# Patient Record
Sex: Female | Born: 1935
Health system: Southern US, Community
[De-identification: ages and names within clinical notes are randomized; demographics above are authoritative.]

## PROBLEM LIST (undated history)

## (undated) DIAGNOSIS — E785 Hyperlipidemia, unspecified: Secondary | ICD-10-CM

## (undated) DIAGNOSIS — I10 Essential (primary) hypertension: Secondary | ICD-10-CM

## (undated) DIAGNOSIS — I809 Phlebitis and thrombophlebitis of unspecified site: Secondary | ICD-10-CM

## (undated) DIAGNOSIS — R296 Repeated falls: Secondary | ICD-10-CM

## (undated) DIAGNOSIS — F32A Depression, unspecified: Secondary | ICD-10-CM

## (undated) DIAGNOSIS — I5032 Chronic diastolic (congestive) heart failure: Secondary | ICD-10-CM

## (undated) DIAGNOSIS — C801 Malignant (primary) neoplasm, unspecified: Secondary | ICD-10-CM

## (undated) DIAGNOSIS — I82409 Acute embolism and thrombosis of unspecified deep veins of unspecified lower extremity: Secondary | ICD-10-CM

## (undated) DIAGNOSIS — M1990 Unspecified osteoarthritis, unspecified site: Secondary | ICD-10-CM

## (undated) DIAGNOSIS — F329 Major depressive disorder, single episode, unspecified: Secondary | ICD-10-CM

## (undated) DIAGNOSIS — I4819 Other persistent atrial fibrillation: Secondary | ICD-10-CM

## (undated) DIAGNOSIS — J45909 Unspecified asthma, uncomplicated: Secondary | ICD-10-CM

## (undated) HISTORY — DX: Unspecified osteoarthritis, unspecified site: M19.90

## (undated) HISTORY — DX: Other persistent atrial fibrillation: I48.19

## (undated) HISTORY — DX: Depression, unspecified: F32.A

## (undated) HISTORY — PX: BREAST CYST ASPIRATION: SHX578

## (undated) HISTORY — PX: CATARACT EXTRACTION: SUR2

## (undated) HISTORY — DX: Essential (primary) hypertension: I10

## (undated) HISTORY — DX: Chronic diastolic (congestive) heart failure: I50.32

## (undated) HISTORY — PX: SKIN BIOPSY: SHX1

## (undated) HISTORY — DX: Unspecified asthma, uncomplicated: J45.909

## (undated) HISTORY — DX: Major depressive disorder, single episode, unspecified: F32.9

## (undated) HISTORY — DX: Hyperlipidemia, unspecified: E78.5

## (undated) HISTORY — DX: Repeated falls: R29.6

---

## 1968-04-22 HISTORY — PX: THYROIDECTOMY: SHX17

## 1970-04-22 HISTORY — PX: OTHER SURGICAL HISTORY: SHX169

## 1986-04-22 HISTORY — PX: OTHER SURGICAL HISTORY: SHX169

## 2004-03-22 ENCOUNTER — Ambulatory Visit: Payer: Self-pay | Admitting: Internal Medicine

## 2004-03-30 ENCOUNTER — Ambulatory Visit: Payer: Self-pay | Admitting: Internal Medicine

## 2004-10-05 ENCOUNTER — Ambulatory Visit: Payer: Self-pay

## 2005-04-29 ENCOUNTER — Ambulatory Visit: Payer: Self-pay

## 2006-05-23 ENCOUNTER — Ambulatory Visit: Payer: Self-pay | Admitting: Internal Medicine

## 2007-10-16 ENCOUNTER — Ambulatory Visit: Payer: Self-pay | Admitting: Ophthalmology

## 2007-10-16 ENCOUNTER — Other Ambulatory Visit: Payer: Self-pay

## 2007-11-18 ENCOUNTER — Ambulatory Visit: Payer: Self-pay | Admitting: Ophthalmology

## 2008-01-20 ENCOUNTER — Ambulatory Visit: Payer: Self-pay | Admitting: Internal Medicine

## 2009-06-16 ENCOUNTER — Ambulatory Visit: Payer: Self-pay | Admitting: Internal Medicine

## 2009-11-29 ENCOUNTER — Ambulatory Visit: Payer: Self-pay | Admitting: Unknown Physician Specialty

## 2009-11-29 HISTORY — PX: WRIST SURGERY: SHX841

## 2010-03-24 ENCOUNTER — Emergency Department: Payer: Self-pay | Admitting: Emergency Medicine

## 2010-07-30 ENCOUNTER — Ambulatory Visit: Payer: Self-pay | Admitting: Internal Medicine

## 2011-05-16 DIAGNOSIS — I4891 Unspecified atrial fibrillation: Secondary | ICD-10-CM | POA: Diagnosis not present

## 2011-05-16 DIAGNOSIS — I10 Essential (primary) hypertension: Secondary | ICD-10-CM | POA: Diagnosis not present

## 2011-05-16 DIAGNOSIS — J449 Chronic obstructive pulmonary disease, unspecified: Secondary | ICD-10-CM | POA: Diagnosis not present

## 2011-06-10 DIAGNOSIS — J310 Chronic rhinitis: Secondary | ICD-10-CM | POA: Diagnosis not present

## 2011-06-10 DIAGNOSIS — J45909 Unspecified asthma, uncomplicated: Secondary | ICD-10-CM | POA: Diagnosis not present

## 2011-06-27 DIAGNOSIS — M159 Polyosteoarthritis, unspecified: Secondary | ICD-10-CM | POA: Diagnosis not present

## 2011-06-27 DIAGNOSIS — E785 Hyperlipidemia, unspecified: Secondary | ICD-10-CM | POA: Diagnosis not present

## 2011-06-27 DIAGNOSIS — M810 Age-related osteoporosis without current pathological fracture: Secondary | ICD-10-CM | POA: Diagnosis not present

## 2011-06-27 DIAGNOSIS — J45909 Unspecified asthma, uncomplicated: Secondary | ICD-10-CM | POA: Diagnosis not present

## 2011-06-27 DIAGNOSIS — K219 Gastro-esophageal reflux disease without esophagitis: Secondary | ICD-10-CM | POA: Diagnosis not present

## 2011-06-27 DIAGNOSIS — I4891 Unspecified atrial fibrillation: Secondary | ICD-10-CM | POA: Diagnosis not present

## 2011-06-27 DIAGNOSIS — E039 Hypothyroidism, unspecified: Secondary | ICD-10-CM | POA: Diagnosis not present

## 2011-10-04 DIAGNOSIS — H6120 Impacted cerumen, unspecified ear: Secondary | ICD-10-CM | POA: Diagnosis not present

## 2011-10-04 DIAGNOSIS — H60339 Swimmer's ear, unspecified ear: Secondary | ICD-10-CM | POA: Diagnosis not present

## 2011-10-07 ENCOUNTER — Ambulatory Visit: Payer: Self-pay | Admitting: Internal Medicine

## 2011-10-07 DIAGNOSIS — Z1231 Encounter for screening mammogram for malignant neoplasm of breast: Secondary | ICD-10-CM | POA: Diagnosis not present

## 2011-10-07 DIAGNOSIS — R920 Mammographic microcalcification found on diagnostic imaging of breast: Secondary | ICD-10-CM | POA: Diagnosis not present

## 2011-10-14 DIAGNOSIS — H60399 Other infective otitis externa, unspecified ear: Secondary | ICD-10-CM | POA: Diagnosis not present

## 2011-10-14 DIAGNOSIS — H6120 Impacted cerumen, unspecified ear: Secondary | ICD-10-CM | POA: Diagnosis not present

## 2011-10-14 DIAGNOSIS — I4891 Unspecified atrial fibrillation: Secondary | ICD-10-CM | POA: Diagnosis not present

## 2011-11-04 DIAGNOSIS — I10 Essential (primary) hypertension: Secondary | ICD-10-CM | POA: Diagnosis not present

## 2011-11-04 DIAGNOSIS — E785 Hyperlipidemia, unspecified: Secondary | ICD-10-CM | POA: Diagnosis not present

## 2011-11-04 DIAGNOSIS — I4891 Unspecified atrial fibrillation: Secondary | ICD-10-CM | POA: Diagnosis not present

## 2011-11-04 DIAGNOSIS — E039 Hypothyroidism, unspecified: Secondary | ICD-10-CM | POA: Diagnosis not present

## 2011-11-04 DIAGNOSIS — J45909 Unspecified asthma, uncomplicated: Secondary | ICD-10-CM | POA: Diagnosis not present

## 2011-11-05 DIAGNOSIS — H7290 Unspecified perforation of tympanic membrane, unspecified ear: Secondary | ICD-10-CM | POA: Diagnosis not present

## 2011-11-05 DIAGNOSIS — J310 Chronic rhinitis: Secondary | ICD-10-CM | POA: Diagnosis not present

## 2011-11-05 DIAGNOSIS — J45909 Unspecified asthma, uncomplicated: Secondary | ICD-10-CM | POA: Diagnosis not present

## 2011-11-05 DIAGNOSIS — H60509 Unspecified acute noninfective otitis externa, unspecified ear: Secondary | ICD-10-CM | POA: Diagnosis not present

## 2011-11-05 DIAGNOSIS — H905 Unspecified sensorineural hearing loss: Secondary | ICD-10-CM | POA: Diagnosis not present

## 2011-11-11 DIAGNOSIS — I10 Essential (primary) hypertension: Secondary | ICD-10-CM | POA: Diagnosis not present

## 2011-11-11 DIAGNOSIS — E782 Mixed hyperlipidemia: Secondary | ICD-10-CM | POA: Diagnosis not present

## 2011-11-11 DIAGNOSIS — I059 Rheumatic mitral valve disease, unspecified: Secondary | ICD-10-CM | POA: Diagnosis not present

## 2011-11-11 DIAGNOSIS — I4891 Unspecified atrial fibrillation: Secondary | ICD-10-CM | POA: Diagnosis not present

## 2011-11-16 DIAGNOSIS — I4891 Unspecified atrial fibrillation: Secondary | ICD-10-CM | POA: Diagnosis not present

## 2011-11-16 DIAGNOSIS — F329 Major depressive disorder, single episode, unspecified: Secondary | ICD-10-CM | POA: Diagnosis not present

## 2011-11-16 DIAGNOSIS — J449 Chronic obstructive pulmonary disease, unspecified: Secondary | ICD-10-CM | POA: Diagnosis not present

## 2011-11-16 DIAGNOSIS — I739 Peripheral vascular disease, unspecified: Secondary | ICD-10-CM | POA: Diagnosis not present

## 2011-11-16 DIAGNOSIS — I10 Essential (primary) hypertension: Secondary | ICD-10-CM | POA: Diagnosis not present

## 2011-11-16 DIAGNOSIS — I2510 Atherosclerotic heart disease of native coronary artery without angina pectoris: Secondary | ICD-10-CM | POA: Diagnosis not present

## 2011-11-16 DIAGNOSIS — Z9981 Dependence on supplemental oxygen: Secondary | ICD-10-CM | POA: Diagnosis not present

## 2011-11-19 DIAGNOSIS — J449 Chronic obstructive pulmonary disease, unspecified: Secondary | ICD-10-CM | POA: Diagnosis not present

## 2011-11-19 DIAGNOSIS — I10 Essential (primary) hypertension: Secondary | ICD-10-CM | POA: Diagnosis not present

## 2011-11-19 DIAGNOSIS — I4891 Unspecified atrial fibrillation: Secondary | ICD-10-CM | POA: Diagnosis not present

## 2011-11-19 DIAGNOSIS — F329 Major depressive disorder, single episode, unspecified: Secondary | ICD-10-CM | POA: Diagnosis not present

## 2011-11-19 DIAGNOSIS — I2510 Atherosclerotic heart disease of native coronary artery without angina pectoris: Secondary | ICD-10-CM | POA: Diagnosis not present

## 2011-11-19 DIAGNOSIS — I739 Peripheral vascular disease, unspecified: Secondary | ICD-10-CM | POA: Diagnosis not present

## 2011-11-21 DIAGNOSIS — I10 Essential (primary) hypertension: Secondary | ICD-10-CM | POA: Diagnosis not present

## 2011-11-21 DIAGNOSIS — I4891 Unspecified atrial fibrillation: Secondary | ICD-10-CM | POA: Diagnosis not present

## 2011-11-21 DIAGNOSIS — I2510 Atherosclerotic heart disease of native coronary artery without angina pectoris: Secondary | ICD-10-CM | POA: Diagnosis not present

## 2011-11-21 DIAGNOSIS — F329 Major depressive disorder, single episode, unspecified: Secondary | ICD-10-CM | POA: Diagnosis not present

## 2011-11-21 DIAGNOSIS — I739 Peripheral vascular disease, unspecified: Secondary | ICD-10-CM | POA: Diagnosis not present

## 2011-11-21 DIAGNOSIS — J449 Chronic obstructive pulmonary disease, unspecified: Secondary | ICD-10-CM | POA: Diagnosis not present

## 2011-12-06 DIAGNOSIS — J449 Chronic obstructive pulmonary disease, unspecified: Secondary | ICD-10-CM | POA: Diagnosis not present

## 2011-12-06 DIAGNOSIS — I739 Peripheral vascular disease, unspecified: Secondary | ICD-10-CM | POA: Diagnosis not present

## 2011-12-06 DIAGNOSIS — I10 Essential (primary) hypertension: Secondary | ICD-10-CM | POA: Diagnosis not present

## 2011-12-06 DIAGNOSIS — I2510 Atherosclerotic heart disease of native coronary artery without angina pectoris: Secondary | ICD-10-CM | POA: Diagnosis not present

## 2011-12-06 DIAGNOSIS — I4891 Unspecified atrial fibrillation: Secondary | ICD-10-CM | POA: Diagnosis not present

## 2011-12-06 DIAGNOSIS — F329 Major depressive disorder, single episode, unspecified: Secondary | ICD-10-CM | POA: Diagnosis not present

## 2011-12-10 DIAGNOSIS — H6120 Impacted cerumen, unspecified ear: Secondary | ICD-10-CM | POA: Diagnosis not present

## 2011-12-10 DIAGNOSIS — H905 Unspecified sensorineural hearing loss: Secondary | ICD-10-CM | POA: Diagnosis not present

## 2011-12-10 DIAGNOSIS — H60509 Unspecified acute noninfective otitis externa, unspecified ear: Secondary | ICD-10-CM | POA: Diagnosis not present

## 2011-12-13 DIAGNOSIS — I739 Peripheral vascular disease, unspecified: Secondary | ICD-10-CM | POA: Diagnosis not present

## 2011-12-13 DIAGNOSIS — I2510 Atherosclerotic heart disease of native coronary artery without angina pectoris: Secondary | ICD-10-CM | POA: Diagnosis not present

## 2011-12-13 DIAGNOSIS — F329 Major depressive disorder, single episode, unspecified: Secondary | ICD-10-CM | POA: Diagnosis not present

## 2011-12-13 DIAGNOSIS — I10 Essential (primary) hypertension: Secondary | ICD-10-CM | POA: Diagnosis not present

## 2011-12-13 DIAGNOSIS — J449 Chronic obstructive pulmonary disease, unspecified: Secondary | ICD-10-CM | POA: Diagnosis not present

## 2011-12-13 DIAGNOSIS — I4891 Unspecified atrial fibrillation: Secondary | ICD-10-CM | POA: Diagnosis not present

## 2011-12-18 DIAGNOSIS — I4891 Unspecified atrial fibrillation: Secondary | ICD-10-CM | POA: Diagnosis not present

## 2011-12-18 DIAGNOSIS — F329 Major depressive disorder, single episode, unspecified: Secondary | ICD-10-CM | POA: Diagnosis not present

## 2011-12-18 DIAGNOSIS — I2510 Atherosclerotic heart disease of native coronary artery without angina pectoris: Secondary | ICD-10-CM | POA: Diagnosis not present

## 2011-12-18 DIAGNOSIS — I10 Essential (primary) hypertension: Secondary | ICD-10-CM | POA: Diagnosis not present

## 2011-12-18 DIAGNOSIS — I739 Peripheral vascular disease, unspecified: Secondary | ICD-10-CM | POA: Diagnosis not present

## 2011-12-18 DIAGNOSIS — J449 Chronic obstructive pulmonary disease, unspecified: Secondary | ICD-10-CM | POA: Diagnosis not present

## 2011-12-30 DIAGNOSIS — E039 Hypothyroidism, unspecified: Secondary | ICD-10-CM | POA: Diagnosis not present

## 2011-12-30 DIAGNOSIS — M810 Age-related osteoporosis without current pathological fracture: Secondary | ICD-10-CM | POA: Diagnosis not present

## 2011-12-30 DIAGNOSIS — I10 Essential (primary) hypertension: Secondary | ICD-10-CM | POA: Diagnosis not present

## 2011-12-30 DIAGNOSIS — Z79899 Other long term (current) drug therapy: Secondary | ICD-10-CM | POA: Diagnosis not present

## 2011-12-30 DIAGNOSIS — E782 Mixed hyperlipidemia: Secondary | ICD-10-CM | POA: Diagnosis not present

## 2012-02-03 DIAGNOSIS — I10 Essential (primary) hypertension: Secondary | ICD-10-CM | POA: Diagnosis not present

## 2012-02-03 DIAGNOSIS — Z23 Encounter for immunization: Secondary | ICD-10-CM | POA: Diagnosis not present

## 2012-02-03 DIAGNOSIS — E785 Hyperlipidemia, unspecified: Secondary | ICD-10-CM | POA: Diagnosis not present

## 2012-02-03 DIAGNOSIS — I4891 Unspecified atrial fibrillation: Secondary | ICD-10-CM | POA: Diagnosis not present

## 2012-02-03 DIAGNOSIS — D1800 Hemangioma unspecified site: Secondary | ICD-10-CM | POA: Diagnosis not present

## 2012-02-03 DIAGNOSIS — D239 Other benign neoplasm of skin, unspecified: Secondary | ICD-10-CM | POA: Diagnosis not present

## 2012-02-03 DIAGNOSIS — J45909 Unspecified asthma, uncomplicated: Secondary | ICD-10-CM | POA: Diagnosis not present

## 2012-02-03 DIAGNOSIS — L821 Other seborrheic keratosis: Secondary | ICD-10-CM | POA: Diagnosis not present

## 2012-02-03 DIAGNOSIS — F329 Major depressive disorder, single episode, unspecified: Secondary | ICD-10-CM | POA: Diagnosis not present

## 2012-02-03 DIAGNOSIS — L578 Other skin changes due to chronic exposure to nonionizing radiation: Secondary | ICD-10-CM | POA: Diagnosis not present

## 2012-02-28 DIAGNOSIS — H26499 Other secondary cataract, unspecified eye: Secondary | ICD-10-CM | POA: Diagnosis not present

## 2012-04-08 ENCOUNTER — Ambulatory Visit: Payer: Self-pay | Admitting: Internal Medicine

## 2012-04-08 DIAGNOSIS — M25569 Pain in unspecified knee: Secondary | ICD-10-CM | POA: Diagnosis not present

## 2012-04-08 DIAGNOSIS — M79609 Pain in unspecified limb: Secondary | ICD-10-CM | POA: Diagnosis not present

## 2012-04-08 DIAGNOSIS — M7989 Other specified soft tissue disorders: Secondary | ICD-10-CM | POA: Diagnosis not present

## 2012-05-04 DIAGNOSIS — I10 Essential (primary) hypertension: Secondary | ICD-10-CM | POA: Diagnosis not present

## 2012-05-04 DIAGNOSIS — E039 Hypothyroidism, unspecified: Secondary | ICD-10-CM | POA: Diagnosis not present

## 2012-05-04 DIAGNOSIS — M7120 Synovial cyst of popliteal space [Baker], unspecified knee: Secondary | ICD-10-CM | POA: Diagnosis not present

## 2012-05-04 DIAGNOSIS — I4891 Unspecified atrial fibrillation: Secondary | ICD-10-CM | POA: Diagnosis not present

## 2012-05-04 DIAGNOSIS — K219 Gastro-esophageal reflux disease without esophagitis: Secondary | ICD-10-CM | POA: Diagnosis not present

## 2012-05-05 DIAGNOSIS — M6580 Other synovitis and tenosynovitis, unspecified site: Secondary | ICD-10-CM | POA: Diagnosis not present

## 2012-06-08 DIAGNOSIS — I10 Essential (primary) hypertension: Secondary | ICD-10-CM | POA: Diagnosis not present

## 2012-06-08 DIAGNOSIS — I4891 Unspecified atrial fibrillation: Secondary | ICD-10-CM | POA: Diagnosis not present

## 2012-06-08 DIAGNOSIS — E785 Hyperlipidemia, unspecified: Secondary | ICD-10-CM | POA: Diagnosis not present

## 2012-06-08 DIAGNOSIS — I059 Rheumatic mitral valve disease, unspecified: Secondary | ICD-10-CM | POA: Diagnosis not present

## 2012-06-09 DIAGNOSIS — M6580 Other synovitis and tenosynovitis, unspecified site: Secondary | ICD-10-CM | POA: Diagnosis not present

## 2012-08-04 DIAGNOSIS — M810 Age-related osteoporosis without current pathological fracture: Secondary | ICD-10-CM | POA: Diagnosis not present

## 2012-08-10 DIAGNOSIS — R05 Cough: Secondary | ICD-10-CM | POA: Diagnosis not present

## 2012-08-10 DIAGNOSIS — J45909 Unspecified asthma, uncomplicated: Secondary | ICD-10-CM | POA: Diagnosis not present

## 2012-08-10 DIAGNOSIS — J438 Other emphysema: Secondary | ICD-10-CM | POA: Diagnosis not present

## 2012-08-31 DIAGNOSIS — E782 Mixed hyperlipidemia: Secondary | ICD-10-CM | POA: Diagnosis not present

## 2012-08-31 DIAGNOSIS — J45909 Unspecified asthma, uncomplicated: Secondary | ICD-10-CM | POA: Diagnosis not present

## 2012-08-31 DIAGNOSIS — R5383 Other fatigue: Secondary | ICD-10-CM | POA: Diagnosis not present

## 2012-08-31 DIAGNOSIS — R609 Edema, unspecified: Secondary | ICD-10-CM | POA: Diagnosis not present

## 2012-08-31 DIAGNOSIS — R5381 Other malaise: Secondary | ICD-10-CM | POA: Diagnosis not present

## 2012-08-31 DIAGNOSIS — M810 Age-related osteoporosis without current pathological fracture: Secondary | ICD-10-CM | POA: Diagnosis not present

## 2012-08-31 DIAGNOSIS — Z006 Encounter for examination for normal comparison and control in clinical research program: Secondary | ICD-10-CM | POA: Diagnosis not present

## 2012-08-31 DIAGNOSIS — I10 Essential (primary) hypertension: Secondary | ICD-10-CM | POA: Diagnosis not present

## 2012-08-31 DIAGNOSIS — M79609 Pain in unspecified limb: Secondary | ICD-10-CM | POA: Diagnosis not present

## 2012-08-31 DIAGNOSIS — E039 Hypothyroidism, unspecified: Secondary | ICD-10-CM | POA: Diagnosis not present

## 2012-10-05 ENCOUNTER — Ambulatory Visit: Payer: Self-pay

## 2012-10-05 DIAGNOSIS — E785 Hyperlipidemia, unspecified: Secondary | ICD-10-CM | POA: Diagnosis not present

## 2012-10-05 DIAGNOSIS — I83893 Varicose veins of bilateral lower extremities with other complications: Secondary | ICD-10-CM | POA: Diagnosis not present

## 2012-10-05 DIAGNOSIS — I739 Peripheral vascular disease, unspecified: Secondary | ICD-10-CM | POA: Diagnosis not present

## 2012-10-05 DIAGNOSIS — R609 Edema, unspecified: Secondary | ICD-10-CM | POA: Diagnosis not present

## 2012-10-05 DIAGNOSIS — M7989 Other specified soft tissue disorders: Secondary | ICD-10-CM | POA: Diagnosis not present

## 2012-10-05 DIAGNOSIS — I10 Essential (primary) hypertension: Secondary | ICD-10-CM | POA: Diagnosis not present

## 2012-10-05 DIAGNOSIS — M25569 Pain in unspecified knee: Secondary | ICD-10-CM | POA: Diagnosis not present

## 2012-10-05 DIAGNOSIS — I82819 Embolism and thrombosis of superficial veins of unspecified lower extremities: Secondary | ICD-10-CM | POA: Diagnosis not present

## 2012-10-05 DIAGNOSIS — M7120 Synovial cyst of popliteal space [Baker], unspecified knee: Secondary | ICD-10-CM | POA: Diagnosis not present

## 2012-10-08 DIAGNOSIS — R609 Edema, unspecified: Secondary | ICD-10-CM | POA: Diagnosis not present

## 2012-10-08 DIAGNOSIS — M25569 Pain in unspecified knee: Secondary | ICD-10-CM | POA: Diagnosis not present

## 2012-10-13 DIAGNOSIS — I83893 Varicose veins of bilateral lower extremities with other complications: Secondary | ICD-10-CM | POA: Diagnosis not present

## 2012-10-13 DIAGNOSIS — J45909 Unspecified asthma, uncomplicated: Secondary | ICD-10-CM | POA: Diagnosis not present

## 2012-10-13 DIAGNOSIS — K219 Gastro-esophageal reflux disease without esophagitis: Secondary | ICD-10-CM | POA: Diagnosis not present

## 2012-10-13 DIAGNOSIS — I4891 Unspecified atrial fibrillation: Secondary | ICD-10-CM | POA: Diagnosis not present

## 2012-10-13 DIAGNOSIS — R609 Edema, unspecified: Secondary | ICD-10-CM | POA: Diagnosis not present

## 2012-10-13 DIAGNOSIS — R5381 Other malaise: Secondary | ICD-10-CM | POA: Diagnosis not present

## 2012-10-13 DIAGNOSIS — I10 Essential (primary) hypertension: Secondary | ICD-10-CM | POA: Diagnosis not present

## 2012-10-13 DIAGNOSIS — R5383 Other fatigue: Secondary | ICD-10-CM | POA: Diagnosis not present

## 2012-11-09 DIAGNOSIS — I059 Rheumatic mitral valve disease, unspecified: Secondary | ICD-10-CM | POA: Diagnosis not present

## 2012-11-09 DIAGNOSIS — I4891 Unspecified atrial fibrillation: Secondary | ICD-10-CM | POA: Diagnosis not present

## 2012-11-09 DIAGNOSIS — I119 Hypertensive heart disease without heart failure: Secondary | ICD-10-CM | POA: Diagnosis not present

## 2012-11-09 DIAGNOSIS — I4949 Other premature depolarization: Secondary | ICD-10-CM | POA: Diagnosis not present

## 2012-11-11 DIAGNOSIS — I4891 Unspecified atrial fibrillation: Secondary | ICD-10-CM | POA: Diagnosis not present

## 2012-11-25 DIAGNOSIS — T63461A Toxic effect of venom of wasps, accidental (unintentional), initial encounter: Secondary | ICD-10-CM | POA: Diagnosis not present

## 2012-11-25 DIAGNOSIS — R229 Localized swelling, mass and lump, unspecified: Secondary | ICD-10-CM | POA: Diagnosis not present

## 2012-11-25 DIAGNOSIS — T7840XA Allergy, unspecified, initial encounter: Secondary | ICD-10-CM | POA: Diagnosis not present

## 2012-11-25 DIAGNOSIS — L299 Pruritus, unspecified: Secondary | ICD-10-CM | POA: Diagnosis not present

## 2013-01-04 DIAGNOSIS — E785 Hyperlipidemia, unspecified: Secondary | ICD-10-CM | POA: Diagnosis not present

## 2013-01-04 DIAGNOSIS — I4891 Unspecified atrial fibrillation: Secondary | ICD-10-CM | POA: Diagnosis not present

## 2013-01-04 DIAGNOSIS — I10 Essential (primary) hypertension: Secondary | ICD-10-CM | POA: Diagnosis not present

## 2013-01-04 DIAGNOSIS — I495 Sick sinus syndrome: Secondary | ICD-10-CM | POA: Diagnosis not present

## 2013-01-05 DIAGNOSIS — I10 Essential (primary) hypertension: Secondary | ICD-10-CM | POA: Diagnosis not present

## 2013-01-05 DIAGNOSIS — K219 Gastro-esophageal reflux disease without esophagitis: Secondary | ICD-10-CM | POA: Diagnosis not present

## 2013-01-05 DIAGNOSIS — I498 Other specified cardiac arrhythmias: Secondary | ICD-10-CM | POA: Diagnosis not present

## 2013-01-05 DIAGNOSIS — H903 Sensorineural hearing loss, bilateral: Secondary | ICD-10-CM | POA: Diagnosis not present

## 2013-01-05 DIAGNOSIS — E785 Hyperlipidemia, unspecified: Secondary | ICD-10-CM | POA: Diagnosis not present

## 2013-01-05 DIAGNOSIS — H60509 Unspecified acute noninfective otitis externa, unspecified ear: Secondary | ICD-10-CM | POA: Diagnosis not present

## 2013-01-05 DIAGNOSIS — M810 Age-related osteoporosis without current pathological fracture: Secondary | ICD-10-CM | POA: Diagnosis not present

## 2013-01-05 DIAGNOSIS — H6120 Impacted cerumen, unspecified ear: Secondary | ICD-10-CM | POA: Diagnosis not present

## 2013-01-05 DIAGNOSIS — Z23 Encounter for immunization: Secondary | ICD-10-CM | POA: Diagnosis not present

## 2013-01-05 DIAGNOSIS — E782 Mixed hyperlipidemia: Secondary | ICD-10-CM | POA: Diagnosis not present

## 2013-01-29 DIAGNOSIS — I10 Essential (primary) hypertension: Secondary | ICD-10-CM | POA: Diagnosis not present

## 2013-01-29 DIAGNOSIS — I4891 Unspecified atrial fibrillation: Secondary | ICD-10-CM | POA: Diagnosis not present

## 2013-01-29 DIAGNOSIS — E782 Mixed hyperlipidemia: Secondary | ICD-10-CM | POA: Diagnosis not present

## 2013-01-29 DIAGNOSIS — Z79899 Other long term (current) drug therapy: Secondary | ICD-10-CM | POA: Diagnosis not present

## 2013-02-04 ENCOUNTER — Ambulatory Visit: Payer: Self-pay | Admitting: Internal Medicine

## 2013-02-04 DIAGNOSIS — Z1231 Encounter for screening mammogram for malignant neoplasm of breast: Secondary | ICD-10-CM | POA: Diagnosis not present

## 2013-02-04 DIAGNOSIS — I059 Rheumatic mitral valve disease, unspecified: Secondary | ICD-10-CM | POA: Diagnosis not present

## 2013-02-04 DIAGNOSIS — I4891 Unspecified atrial fibrillation: Secondary | ICD-10-CM | POA: Diagnosis not present

## 2013-02-04 DIAGNOSIS — E782 Mixed hyperlipidemia: Secondary | ICD-10-CM | POA: Diagnosis not present

## 2013-02-04 DIAGNOSIS — I119 Hypertensive heart disease without heart failure: Secondary | ICD-10-CM | POA: Diagnosis not present

## 2013-02-05 DIAGNOSIS — R05 Cough: Secondary | ICD-10-CM | POA: Diagnosis not present

## 2013-02-05 DIAGNOSIS — J45909 Unspecified asthma, uncomplicated: Secondary | ICD-10-CM | POA: Diagnosis not present

## 2013-02-23 DIAGNOSIS — M810 Age-related osteoporosis without current pathological fracture: Secondary | ICD-10-CM | POA: Diagnosis not present

## 2013-02-23 DIAGNOSIS — E039 Hypothyroidism, unspecified: Secondary | ICD-10-CM | POA: Diagnosis not present

## 2013-02-23 DIAGNOSIS — E782 Mixed hyperlipidemia: Secondary | ICD-10-CM | POA: Diagnosis not present

## 2013-02-23 DIAGNOSIS — M25519 Pain in unspecified shoulder: Secondary | ICD-10-CM | POA: Diagnosis not present

## 2013-02-23 DIAGNOSIS — R002 Palpitations: Secondary | ICD-10-CM | POA: Diagnosis not present

## 2013-02-23 DIAGNOSIS — Z23 Encounter for immunization: Secondary | ICD-10-CM | POA: Diagnosis not present

## 2013-02-23 DIAGNOSIS — I4891 Unspecified atrial fibrillation: Secondary | ICD-10-CM | POA: Diagnosis not present

## 2013-02-25 DIAGNOSIS — M67919 Unspecified disorder of synovium and tendon, unspecified shoulder: Secondary | ICD-10-CM | POA: Diagnosis not present

## 2013-04-06 DIAGNOSIS — E039 Hypothyroidism, unspecified: Secondary | ICD-10-CM | POA: Diagnosis not present

## 2013-04-06 DIAGNOSIS — I4891 Unspecified atrial fibrillation: Secondary | ICD-10-CM | POA: Diagnosis not present

## 2013-05-27 DIAGNOSIS — I4891 Unspecified atrial fibrillation: Secondary | ICD-10-CM | POA: Diagnosis not present

## 2013-05-27 DIAGNOSIS — E782 Mixed hyperlipidemia: Secondary | ICD-10-CM | POA: Diagnosis not present

## 2013-05-27 DIAGNOSIS — I10 Essential (primary) hypertension: Secondary | ICD-10-CM | POA: Diagnosis not present

## 2013-05-27 DIAGNOSIS — M25519 Pain in unspecified shoulder: Secondary | ICD-10-CM | POA: Diagnosis not present

## 2013-06-03 DIAGNOSIS — Z9981 Dependence on supplemental oxygen: Secondary | ICD-10-CM | POA: Diagnosis not present

## 2013-06-03 DIAGNOSIS — J449 Chronic obstructive pulmonary disease, unspecified: Secondary | ICD-10-CM | POA: Diagnosis not present

## 2013-06-03 DIAGNOSIS — I4891 Unspecified atrial fibrillation: Secondary | ICD-10-CM | POA: Diagnosis not present

## 2013-06-03 DIAGNOSIS — I739 Peripheral vascular disease, unspecified: Secondary | ICD-10-CM | POA: Diagnosis not present

## 2013-06-03 DIAGNOSIS — M1990 Unspecified osteoarthritis, unspecified site: Secondary | ICD-10-CM | POA: Diagnosis not present

## 2013-06-03 DIAGNOSIS — Z9181 History of falling: Secondary | ICD-10-CM | POA: Diagnosis not present

## 2013-06-03 DIAGNOSIS — M25519 Pain in unspecified shoulder: Secondary | ICD-10-CM | POA: Diagnosis not present

## 2013-06-03 DIAGNOSIS — M810 Age-related osteoporosis without current pathological fracture: Secondary | ICD-10-CM | POA: Diagnosis not present

## 2013-06-03 DIAGNOSIS — I129 Hypertensive chronic kidney disease with stage 1 through stage 4 chronic kidney disease, or unspecified chronic kidney disease: Secondary | ICD-10-CM | POA: Diagnosis not present

## 2013-06-03 DIAGNOSIS — Z8541 Personal history of malignant neoplasm of cervix uteri: Secondary | ICD-10-CM | POA: Diagnosis not present

## 2013-06-03 DIAGNOSIS — M6281 Muscle weakness (generalized): Secondary | ICD-10-CM | POA: Diagnosis not present

## 2013-06-03 DIAGNOSIS — IMO0001 Reserved for inherently not codable concepts without codable children: Secondary | ICD-10-CM | POA: Diagnosis not present

## 2013-06-03 DIAGNOSIS — N186 End stage renal disease: Secondary | ICD-10-CM | POA: Diagnosis not present

## 2013-06-09 DIAGNOSIS — I129 Hypertensive chronic kidney disease with stage 1 through stage 4 chronic kidney disease, or unspecified chronic kidney disease: Secondary | ICD-10-CM | POA: Diagnosis not present

## 2013-06-09 DIAGNOSIS — M25519 Pain in unspecified shoulder: Secondary | ICD-10-CM | POA: Diagnosis not present

## 2013-06-09 DIAGNOSIS — IMO0001 Reserved for inherently not codable concepts without codable children: Secondary | ICD-10-CM | POA: Diagnosis not present

## 2013-06-09 DIAGNOSIS — M6281 Muscle weakness (generalized): Secondary | ICD-10-CM | POA: Diagnosis not present

## 2013-06-09 DIAGNOSIS — M1990 Unspecified osteoarthritis, unspecified site: Secondary | ICD-10-CM | POA: Diagnosis not present

## 2013-06-09 DIAGNOSIS — I4891 Unspecified atrial fibrillation: Secondary | ICD-10-CM | POA: Diagnosis not present

## 2013-06-10 DIAGNOSIS — I129 Hypertensive chronic kidney disease with stage 1 through stage 4 chronic kidney disease, or unspecified chronic kidney disease: Secondary | ICD-10-CM | POA: Diagnosis not present

## 2013-06-10 DIAGNOSIS — M1990 Unspecified osteoarthritis, unspecified site: Secondary | ICD-10-CM | POA: Diagnosis not present

## 2013-06-10 DIAGNOSIS — I739 Peripheral vascular disease, unspecified: Secondary | ICD-10-CM | POA: Diagnosis not present

## 2013-06-10 DIAGNOSIS — N186 End stage renal disease: Secondary | ICD-10-CM | POA: Diagnosis not present

## 2013-06-10 DIAGNOSIS — M25519 Pain in unspecified shoulder: Secondary | ICD-10-CM | POA: Diagnosis not present

## 2013-06-10 DIAGNOSIS — M6281 Muscle weakness (generalized): Secondary | ICD-10-CM | POA: Diagnosis not present

## 2013-06-10 DIAGNOSIS — J449 Chronic obstructive pulmonary disease, unspecified: Secondary | ICD-10-CM | POA: Diagnosis not present

## 2013-06-11 DIAGNOSIS — I4891 Unspecified atrial fibrillation: Secondary | ICD-10-CM | POA: Diagnosis not present

## 2013-06-11 DIAGNOSIS — I129 Hypertensive chronic kidney disease with stage 1 through stage 4 chronic kidney disease, or unspecified chronic kidney disease: Secondary | ICD-10-CM | POA: Diagnosis not present

## 2013-06-11 DIAGNOSIS — M6281 Muscle weakness (generalized): Secondary | ICD-10-CM | POA: Diagnosis not present

## 2013-06-11 DIAGNOSIS — IMO0001 Reserved for inherently not codable concepts without codable children: Secondary | ICD-10-CM | POA: Diagnosis not present

## 2013-06-11 DIAGNOSIS — M1990 Unspecified osteoarthritis, unspecified site: Secondary | ICD-10-CM | POA: Diagnosis not present

## 2013-06-11 DIAGNOSIS — M25519 Pain in unspecified shoulder: Secondary | ICD-10-CM | POA: Diagnosis not present

## 2013-06-16 DIAGNOSIS — M25519 Pain in unspecified shoulder: Secondary | ICD-10-CM | POA: Diagnosis not present

## 2013-06-16 DIAGNOSIS — IMO0001 Reserved for inherently not codable concepts without codable children: Secondary | ICD-10-CM | POA: Diagnosis not present

## 2013-06-16 DIAGNOSIS — I129 Hypertensive chronic kidney disease with stage 1 through stage 4 chronic kidney disease, or unspecified chronic kidney disease: Secondary | ICD-10-CM | POA: Diagnosis not present

## 2013-06-16 DIAGNOSIS — M6281 Muscle weakness (generalized): Secondary | ICD-10-CM | POA: Diagnosis not present

## 2013-06-16 DIAGNOSIS — M1990 Unspecified osteoarthritis, unspecified site: Secondary | ICD-10-CM | POA: Diagnosis not present

## 2013-06-16 DIAGNOSIS — I4891 Unspecified atrial fibrillation: Secondary | ICD-10-CM | POA: Diagnosis not present

## 2013-06-18 DIAGNOSIS — IMO0001 Reserved for inherently not codable concepts without codable children: Secondary | ICD-10-CM | POA: Diagnosis not present

## 2013-06-18 DIAGNOSIS — M6281 Muscle weakness (generalized): Secondary | ICD-10-CM | POA: Diagnosis not present

## 2013-06-18 DIAGNOSIS — I4891 Unspecified atrial fibrillation: Secondary | ICD-10-CM | POA: Diagnosis not present

## 2013-06-18 DIAGNOSIS — I129 Hypertensive chronic kidney disease with stage 1 through stage 4 chronic kidney disease, or unspecified chronic kidney disease: Secondary | ICD-10-CM | POA: Diagnosis not present

## 2013-06-18 DIAGNOSIS — M25519 Pain in unspecified shoulder: Secondary | ICD-10-CM | POA: Diagnosis not present

## 2013-06-18 DIAGNOSIS — M1990 Unspecified osteoarthritis, unspecified site: Secondary | ICD-10-CM | POA: Diagnosis not present

## 2013-07-08 DIAGNOSIS — R05 Cough: Secondary | ICD-10-CM | POA: Diagnosis not present

## 2013-07-08 DIAGNOSIS — I10 Essential (primary) hypertension: Secondary | ICD-10-CM | POA: Diagnosis not present

## 2013-07-08 DIAGNOSIS — J309 Allergic rhinitis, unspecified: Secondary | ICD-10-CM | POA: Diagnosis not present

## 2013-07-08 DIAGNOSIS — R059 Cough, unspecified: Secondary | ICD-10-CM | POA: Diagnosis not present

## 2013-07-08 DIAGNOSIS — J019 Acute sinusitis, unspecified: Secondary | ICD-10-CM | POA: Diagnosis not present

## 2013-07-08 DIAGNOSIS — J45901 Unspecified asthma with (acute) exacerbation: Secondary | ICD-10-CM | POA: Diagnosis not present

## 2013-07-15 DIAGNOSIS — R5383 Other fatigue: Secondary | ICD-10-CM | POA: Diagnosis not present

## 2013-07-15 DIAGNOSIS — J45901 Unspecified asthma with (acute) exacerbation: Secondary | ICD-10-CM | POA: Diagnosis not present

## 2013-07-15 DIAGNOSIS — R5381 Other malaise: Secondary | ICD-10-CM | POA: Diagnosis not present

## 2013-07-15 DIAGNOSIS — I10 Essential (primary) hypertension: Secondary | ICD-10-CM | POA: Diagnosis not present

## 2013-07-15 DIAGNOSIS — J069 Acute upper respiratory infection, unspecified: Secondary | ICD-10-CM | POA: Diagnosis not present

## 2013-07-15 DIAGNOSIS — E039 Hypothyroidism, unspecified: Secondary | ICD-10-CM | POA: Diagnosis not present

## 2013-07-15 DIAGNOSIS — J309 Allergic rhinitis, unspecified: Secondary | ICD-10-CM | POA: Diagnosis not present

## 2013-07-15 DIAGNOSIS — R05 Cough: Secondary | ICD-10-CM | POA: Diagnosis not present

## 2013-07-15 DIAGNOSIS — R059 Cough, unspecified: Secondary | ICD-10-CM | POA: Diagnosis not present

## 2013-07-21 DIAGNOSIS — J449 Chronic obstructive pulmonary disease, unspecified: Secondary | ICD-10-CM | POA: Diagnosis not present

## 2013-07-21 DIAGNOSIS — I10 Essential (primary) hypertension: Secondary | ICD-10-CM | POA: Diagnosis not present

## 2013-07-21 DIAGNOSIS — R059 Cough, unspecified: Secondary | ICD-10-CM | POA: Diagnosis not present

## 2013-07-21 DIAGNOSIS — R062 Wheezing: Secondary | ICD-10-CM | POA: Diagnosis not present

## 2013-07-21 DIAGNOSIS — J069 Acute upper respiratory infection, unspecified: Secondary | ICD-10-CM | POA: Diagnosis not present

## 2013-08-19 DIAGNOSIS — J45909 Unspecified asthma, uncomplicated: Secondary | ICD-10-CM | POA: Diagnosis not present

## 2013-08-26 DIAGNOSIS — J45909 Unspecified asthma, uncomplicated: Secondary | ICD-10-CM | POA: Diagnosis not present

## 2013-08-26 DIAGNOSIS — J069 Acute upper respiratory infection, unspecified: Secondary | ICD-10-CM | POA: Diagnosis not present

## 2013-08-26 DIAGNOSIS — M25519 Pain in unspecified shoulder: Secondary | ICD-10-CM | POA: Diagnosis not present

## 2013-08-26 DIAGNOSIS — L659 Nonscarring hair loss, unspecified: Secondary | ICD-10-CM | POA: Diagnosis not present

## 2013-08-26 DIAGNOSIS — I4891 Unspecified atrial fibrillation: Secondary | ICD-10-CM | POA: Diagnosis not present

## 2013-08-26 DIAGNOSIS — I10 Essential (primary) hypertension: Secondary | ICD-10-CM | POA: Diagnosis not present

## 2013-08-26 DIAGNOSIS — J309 Allergic rhinitis, unspecified: Secondary | ICD-10-CM | POA: Diagnosis not present

## 2013-08-30 DIAGNOSIS — E038 Other specified hypothyroidism: Secondary | ICD-10-CM | POA: Diagnosis not present

## 2013-08-30 DIAGNOSIS — Z79899 Other long term (current) drug therapy: Secondary | ICD-10-CM | POA: Diagnosis not present

## 2013-09-30 DIAGNOSIS — M810 Age-related osteoporosis without current pathological fracture: Secondary | ICD-10-CM | POA: Diagnosis not present

## 2013-12-16 DIAGNOSIS — M6281 Muscle weakness (generalized): Secondary | ICD-10-CM | POA: Diagnosis not present

## 2013-12-16 DIAGNOSIS — N390 Urinary tract infection, site not specified: Secondary | ICD-10-CM | POA: Diagnosis not present

## 2013-12-16 DIAGNOSIS — L659 Nonscarring hair loss, unspecified: Secondary | ICD-10-CM | POA: Diagnosis not present

## 2013-12-16 DIAGNOSIS — E039 Hypothyroidism, unspecified: Secondary | ICD-10-CM | POA: Diagnosis not present

## 2013-12-16 DIAGNOSIS — I10 Essential (primary) hypertension: Secondary | ICD-10-CM | POA: Diagnosis not present

## 2013-12-30 DIAGNOSIS — E039 Hypothyroidism, unspecified: Secondary | ICD-10-CM | POA: Diagnosis not present

## 2013-12-30 DIAGNOSIS — I10 Essential (primary) hypertension: Secondary | ICD-10-CM | POA: Diagnosis not present

## 2013-12-30 DIAGNOSIS — J45909 Unspecified asthma, uncomplicated: Secondary | ICD-10-CM | POA: Diagnosis not present

## 2013-12-30 DIAGNOSIS — K219 Gastro-esophageal reflux disease without esophagitis: Secondary | ICD-10-CM | POA: Diagnosis not present

## 2013-12-30 DIAGNOSIS — R5383 Other fatigue: Secondary | ICD-10-CM | POA: Diagnosis not present

## 2013-12-30 DIAGNOSIS — R5381 Other malaise: Secondary | ICD-10-CM | POA: Diagnosis not present

## 2013-12-30 DIAGNOSIS — I4891 Unspecified atrial fibrillation: Secondary | ICD-10-CM | POA: Diagnosis not present

## 2014-01-27 DIAGNOSIS — H6120 Impacted cerumen, unspecified ear: Secondary | ICD-10-CM | POA: Diagnosis not present

## 2014-01-27 DIAGNOSIS — H903 Sensorineural hearing loss, bilateral: Secondary | ICD-10-CM | POA: Diagnosis not present

## 2014-02-10 DIAGNOSIS — Z23 Encounter for immunization: Secondary | ICD-10-CM | POA: Diagnosis not present

## 2014-02-23 DIAGNOSIS — E038 Other specified hypothyroidism: Secondary | ICD-10-CM | POA: Diagnosis not present

## 2014-02-23 DIAGNOSIS — Z79899 Other long term (current) drug therapy: Secondary | ICD-10-CM | POA: Diagnosis not present

## 2014-03-03 DIAGNOSIS — I499 Cardiac arrhythmia, unspecified: Secondary | ICD-10-CM | POA: Diagnosis not present

## 2014-03-03 DIAGNOSIS — I10 Essential (primary) hypertension: Secondary | ICD-10-CM | POA: Diagnosis not present

## 2014-03-03 DIAGNOSIS — Z5181 Encounter for therapeutic drug level monitoring: Secondary | ICD-10-CM | POA: Diagnosis not present

## 2014-03-03 DIAGNOSIS — J45909 Unspecified asthma, uncomplicated: Secondary | ICD-10-CM | POA: Diagnosis not present

## 2014-03-31 DIAGNOSIS — M810 Age-related osteoporosis without current pathological fracture: Secondary | ICD-10-CM | POA: Diagnosis not present

## 2014-03-31 DIAGNOSIS — I499 Cardiac arrhythmia, unspecified: Secondary | ICD-10-CM | POA: Diagnosis not present

## 2014-03-31 DIAGNOSIS — Z5181 Encounter for therapeutic drug level monitoring: Secondary | ICD-10-CM | POA: Diagnosis not present

## 2014-04-13 DIAGNOSIS — M810 Age-related osteoporosis without current pathological fracture: Secondary | ICD-10-CM | POA: Diagnosis not present

## 2014-04-28 DIAGNOSIS — M810 Age-related osteoporosis without current pathological fracture: Secondary | ICD-10-CM | POA: Diagnosis not present

## 2014-05-12 DIAGNOSIS — J4520 Mild intermittent asthma, uncomplicated: Secondary | ICD-10-CM | POA: Insufficient documentation

## 2014-06-08 DIAGNOSIS — Z1283 Encounter for screening for malignant neoplasm of skin: Secondary | ICD-10-CM | POA: Diagnosis not present

## 2014-06-08 DIAGNOSIS — L814 Other melanin hyperpigmentation: Secondary | ICD-10-CM | POA: Diagnosis not present

## 2014-06-08 DIAGNOSIS — D485 Neoplasm of uncertain behavior of skin: Secondary | ICD-10-CM | POA: Diagnosis not present

## 2014-06-08 DIAGNOSIS — L821 Other seborrheic keratosis: Secondary | ICD-10-CM | POA: Diagnosis not present

## 2014-06-08 DIAGNOSIS — L820 Inflamed seborrheic keratosis: Secondary | ICD-10-CM | POA: Diagnosis not present

## 2014-06-08 DIAGNOSIS — L578 Other skin changes due to chronic exposure to nonionizing radiation: Secondary | ICD-10-CM | POA: Diagnosis not present

## 2014-06-08 DIAGNOSIS — I781 Nevus, non-neoplastic: Secondary | ICD-10-CM | POA: Diagnosis not present

## 2014-06-08 DIAGNOSIS — D1800 Hemangioma unspecified site: Secondary | ICD-10-CM | POA: Diagnosis not present

## 2014-06-08 DIAGNOSIS — D229 Melanocytic nevi, unspecified: Secondary | ICD-10-CM | POA: Diagnosis not present

## 2014-06-08 DIAGNOSIS — C44319 Basal cell carcinoma of skin of other parts of face: Secondary | ICD-10-CM | POA: Diagnosis not present

## 2014-06-08 DIAGNOSIS — I8390 Asymptomatic varicose veins of unspecified lower extremity: Secondary | ICD-10-CM | POA: Diagnosis not present

## 2014-07-07 DIAGNOSIS — I10 Essential (primary) hypertension: Secondary | ICD-10-CM | POA: Diagnosis not present

## 2014-07-07 DIAGNOSIS — I499 Cardiac arrhythmia, unspecified: Secondary | ICD-10-CM | POA: Diagnosis not present

## 2014-07-07 DIAGNOSIS — J449 Chronic obstructive pulmonary disease, unspecified: Secondary | ICD-10-CM | POA: Diagnosis not present

## 2014-07-07 DIAGNOSIS — E782 Mixed hyperlipidemia: Secondary | ICD-10-CM | POA: Diagnosis not present

## 2014-07-07 DIAGNOSIS — J329 Chronic sinusitis, unspecified: Secondary | ICD-10-CM | POA: Diagnosis not present

## 2014-07-07 DIAGNOSIS — E039 Hypothyroidism, unspecified: Secondary | ICD-10-CM | POA: Diagnosis not present

## 2014-08-02 DIAGNOSIS — C4491 Basal cell carcinoma of skin, unspecified: Secondary | ICD-10-CM

## 2014-08-02 DIAGNOSIS — C44319 Basal cell carcinoma of skin of other parts of face: Secondary | ICD-10-CM | POA: Diagnosis not present

## 2014-08-02 HISTORY — DX: Basal cell carcinoma of skin, unspecified: C44.91

## 2014-09-01 DIAGNOSIS — Z1231 Encounter for screening mammogram for malignant neoplasm of breast: Secondary | ICD-10-CM | POA: Diagnosis not present

## 2014-09-07 DIAGNOSIS — H906 Mixed conductive and sensorineural hearing loss, bilateral: Secondary | ICD-10-CM | POA: Diagnosis not present

## 2014-09-07 DIAGNOSIS — H9193 Unspecified hearing loss, bilateral: Secondary | ICD-10-CM | POA: Diagnosis not present

## 2014-09-07 DIAGNOSIS — H6121 Impacted cerumen, right ear: Secondary | ICD-10-CM | POA: Diagnosis not present

## 2014-09-20 DIAGNOSIS — H6980 Other specified disorders of Eustachian tube, unspecified ear: Secondary | ICD-10-CM | POA: Diagnosis not present

## 2014-09-20 DIAGNOSIS — H6121 Impacted cerumen, right ear: Secondary | ICD-10-CM | POA: Diagnosis not present

## 2014-10-05 DIAGNOSIS — M810 Age-related osteoporosis without current pathological fracture: Secondary | ICD-10-CM | POA: Diagnosis not present

## 2014-10-05 DIAGNOSIS — L98491 Non-pressure chronic ulcer of skin of other sites limited to breakdown of skin: Secondary | ICD-10-CM | POA: Diagnosis not present

## 2014-10-05 DIAGNOSIS — R208 Other disturbances of skin sensation: Secondary | ICD-10-CM | POA: Diagnosis not present

## 2014-10-05 DIAGNOSIS — L0291 Cutaneous abscess, unspecified: Secondary | ICD-10-CM | POA: Diagnosis not present

## 2014-10-05 DIAGNOSIS — H908 Mixed conductive and sensorineural hearing loss, unspecified: Secondary | ICD-10-CM | POA: Diagnosis not present

## 2014-10-06 ENCOUNTER — Other Ambulatory Visit: Payer: Self-pay | Admitting: Nurse Practitioner

## 2014-10-06 DIAGNOSIS — N6489 Other specified disorders of breast: Secondary | ICD-10-CM

## 2014-10-06 DIAGNOSIS — Q838 Other congenital malformations of breast: Secondary | ICD-10-CM

## 2014-10-14 ENCOUNTER — Ambulatory Visit: Payer: Self-pay

## 2014-10-14 ENCOUNTER — Ambulatory Visit
Admission: RE | Admit: 2014-10-14 | Discharge: 2014-10-14 | Disposition: A | Discharge status: Home / Self Care | Payer: Medicare Other | Source: Ambulatory Visit | Attending: Nurse Practitioner | Admitting: Nurse Practitioner

## 2014-10-14 DIAGNOSIS — N649 Disorder of breast, unspecified: Secondary | ICD-10-CM | POA: Diagnosis not present

## 2014-10-14 DIAGNOSIS — J4520 Mild intermittent asthma, uncomplicated: Secondary | ICD-10-CM | POA: Diagnosis not present

## 2014-10-14 DIAGNOSIS — H903 Sensorineural hearing loss, bilateral: Secondary | ICD-10-CM | POA: Diagnosis not present

## 2014-10-14 DIAGNOSIS — H6121 Impacted cerumen, right ear: Secondary | ICD-10-CM | POA: Diagnosis not present

## 2014-10-14 DIAGNOSIS — J301 Allergic rhinitis due to pollen: Secondary | ICD-10-CM | POA: Diagnosis not present

## 2014-10-14 DIAGNOSIS — R928 Other abnormal and inconclusive findings on diagnostic imaging of breast: Secondary | ICD-10-CM | POA: Diagnosis not present

## 2014-10-14 DIAGNOSIS — H6980 Other specified disorders of Eustachian tube, unspecified ear: Secondary | ICD-10-CM | POA: Diagnosis not present

## 2014-10-14 DIAGNOSIS — N6489 Other specified disorders of breast: Secondary | ICD-10-CM

## 2014-10-14 DIAGNOSIS — Q838 Other congenital malformations of breast: Secondary | ICD-10-CM

## 2014-10-19 DIAGNOSIS — M810 Age-related osteoporosis without current pathological fracture: Secondary | ICD-10-CM | POA: Diagnosis not present

## 2014-10-19 DIAGNOSIS — R238 Other skin changes: Secondary | ICD-10-CM | POA: Diagnosis not present

## 2014-10-19 DIAGNOSIS — D485 Neoplasm of uncertain behavior of skin: Secondary | ICD-10-CM | POA: Diagnosis not present

## 2014-10-19 DIAGNOSIS — C4492 Squamous cell carcinoma of skin, unspecified: Secondary | ICD-10-CM

## 2014-10-19 DIAGNOSIS — Z85828 Personal history of other malignant neoplasm of skin: Secondary | ICD-10-CM | POA: Diagnosis not present

## 2014-10-19 HISTORY — DX: Squamous cell carcinoma of skin, unspecified: C44.92

## 2014-12-06 DIAGNOSIS — C44329 Squamous cell carcinoma of skin of other parts of face: Secondary | ICD-10-CM | POA: Diagnosis not present

## 2014-12-15 DIAGNOSIS — I10 Essential (primary) hypertension: Secondary | ICD-10-CM | POA: Diagnosis not present

## 2014-12-15 DIAGNOSIS — Z0000 Encounter for general adult medical examination without abnormal findings: Secondary | ICD-10-CM | POA: Diagnosis not present

## 2014-12-15 DIAGNOSIS — E039 Hypothyroidism, unspecified: Secondary | ICD-10-CM | POA: Diagnosis not present

## 2014-12-15 DIAGNOSIS — I499 Cardiac arrhythmia, unspecified: Secondary | ICD-10-CM | POA: Diagnosis not present

## 2014-12-15 DIAGNOSIS — J449 Chronic obstructive pulmonary disease, unspecified: Secondary | ICD-10-CM | POA: Diagnosis not present

## 2014-12-15 DIAGNOSIS — J329 Chronic sinusitis, unspecified: Secondary | ICD-10-CM | POA: Diagnosis not present

## 2014-12-15 DIAGNOSIS — Z0001 Encounter for general adult medical examination with abnormal findings: Secondary | ICD-10-CM | POA: Diagnosis not present

## 2014-12-20 DIAGNOSIS — H903 Sensorineural hearing loss, bilateral: Secondary | ICD-10-CM | POA: Diagnosis not present

## 2014-12-20 DIAGNOSIS — H6983 Other specified disorders of Eustachian tube, bilateral: Secondary | ICD-10-CM | POA: Diagnosis not present

## 2014-12-29 DIAGNOSIS — H908 Mixed conductive and sensorineural hearing loss, unspecified: Secondary | ICD-10-CM | POA: Diagnosis not present

## 2014-12-30 DIAGNOSIS — E559 Vitamin D deficiency, unspecified: Secondary | ICD-10-CM | POA: Diagnosis not present

## 2014-12-30 DIAGNOSIS — Z5181 Encounter for therapeutic drug level monitoring: Secondary | ICD-10-CM | POA: Diagnosis not present

## 2014-12-30 DIAGNOSIS — E039 Hypothyroidism, unspecified: Secondary | ICD-10-CM | POA: Diagnosis not present

## 2014-12-30 DIAGNOSIS — Z961 Presence of intraocular lens: Secondary | ICD-10-CM | POA: Diagnosis not present

## 2014-12-30 DIAGNOSIS — I10 Essential (primary) hypertension: Secondary | ICD-10-CM | POA: Diagnosis not present

## 2015-02-15 DIAGNOSIS — Z23 Encounter for immunization: Secondary | ICD-10-CM | POA: Diagnosis not present

## 2015-03-22 DIAGNOSIS — H6063 Unspecified chronic otitis externa, bilateral: Secondary | ICD-10-CM | POA: Diagnosis not present

## 2015-03-22 DIAGNOSIS — H6123 Impacted cerumen, bilateral: Secondary | ICD-10-CM | POA: Diagnosis not present

## 2015-04-20 DIAGNOSIS — I499 Cardiac arrhythmia, unspecified: Secondary | ICD-10-CM | POA: Diagnosis not present

## 2015-04-20 DIAGNOSIS — J069 Acute upper respiratory infection, unspecified: Secondary | ICD-10-CM | POA: Diagnosis not present

## 2015-04-20 DIAGNOSIS — E039 Hypothyroidism, unspecified: Secondary | ICD-10-CM | POA: Diagnosis not present

## 2015-04-20 DIAGNOSIS — E875 Hyperkalemia: Secondary | ICD-10-CM | POA: Diagnosis not present

## 2015-04-20 DIAGNOSIS — R7301 Impaired fasting glucose: Secondary | ICD-10-CM | POA: Diagnosis not present

## 2015-04-20 DIAGNOSIS — Z5181 Encounter for therapeutic drug level monitoring: Secondary | ICD-10-CM | POA: Diagnosis not present

## 2015-04-20 DIAGNOSIS — J449 Chronic obstructive pulmonary disease, unspecified: Secondary | ICD-10-CM | POA: Diagnosis not present

## 2015-05-10 DIAGNOSIS — M810 Age-related osteoporosis without current pathological fracture: Secondary | ICD-10-CM | POA: Diagnosis not present

## 2015-05-31 DIAGNOSIS — M810 Age-related osteoporosis without current pathological fracture: Secondary | ICD-10-CM | POA: Diagnosis not present

## 2015-06-15 DIAGNOSIS — S79911A Unspecified injury of right hip, initial encounter: Secondary | ICD-10-CM | POA: Insufficient documentation

## 2015-06-15 DIAGNOSIS — L821 Other seborrheic keratosis: Secondary | ICD-10-CM | POA: Diagnosis not present

## 2015-06-15 DIAGNOSIS — I8393 Asymptomatic varicose veins of bilateral lower extremities: Secondary | ICD-10-CM | POA: Diagnosis not present

## 2015-06-15 DIAGNOSIS — L570 Actinic keratosis: Secondary | ICD-10-CM | POA: Diagnosis not present

## 2015-06-15 DIAGNOSIS — I781 Nevus, non-neoplastic: Secondary | ICD-10-CM | POA: Diagnosis not present

## 2015-06-15 DIAGNOSIS — Z85828 Personal history of other malignant neoplasm of skin: Secondary | ICD-10-CM | POA: Diagnosis not present

## 2015-06-15 DIAGNOSIS — L820 Inflamed seborrheic keratosis: Secondary | ICD-10-CM | POA: Diagnosis not present

## 2015-06-15 DIAGNOSIS — L578 Other skin changes due to chronic exposure to nonionizing radiation: Secondary | ICD-10-CM | POA: Diagnosis not present

## 2015-06-15 DIAGNOSIS — L812 Freckles: Secondary | ICD-10-CM | POA: Diagnosis not present

## 2015-06-15 DIAGNOSIS — D229 Melanocytic nevi, unspecified: Secondary | ICD-10-CM | POA: Diagnosis not present

## 2015-06-15 DIAGNOSIS — D1800 Hemangioma unspecified site: Secondary | ICD-10-CM | POA: Diagnosis not present

## 2015-06-15 DIAGNOSIS — L720 Epidermal cyst: Secondary | ICD-10-CM | POA: Diagnosis not present

## 2015-06-15 DIAGNOSIS — Z1283 Encounter for screening for malignant neoplasm of skin: Secondary | ICD-10-CM | POA: Diagnosis not present

## 2015-06-23 DIAGNOSIS — H6123 Impacted cerumen, bilateral: Secondary | ICD-10-CM | POA: Diagnosis not present

## 2015-06-23 DIAGNOSIS — H6062 Unspecified chronic otitis externa, left ear: Secondary | ICD-10-CM | POA: Diagnosis not present

## 2015-06-23 DIAGNOSIS — H903 Sensorineural hearing loss, bilateral: Secondary | ICD-10-CM | POA: Diagnosis not present

## 2015-06-29 DIAGNOSIS — Z23 Encounter for immunization: Secondary | ICD-10-CM | POA: Diagnosis not present

## 2015-08-05 DIAGNOSIS — K5900 Constipation, unspecified: Secondary | ICD-10-CM | POA: Diagnosis not present

## 2015-08-05 DIAGNOSIS — R062 Wheezing: Secondary | ICD-10-CM | POA: Diagnosis not present

## 2015-08-05 DIAGNOSIS — J45901 Unspecified asthma with (acute) exacerbation: Secondary | ICD-10-CM | POA: Diagnosis not present

## 2015-08-05 DIAGNOSIS — I10 Essential (primary) hypertension: Secondary | ICD-10-CM | POA: Diagnosis not present

## 2015-08-05 DIAGNOSIS — D72829 Elevated white blood cell count, unspecified: Secondary | ICD-10-CM | POA: Diagnosis not present

## 2015-08-05 DIAGNOSIS — J189 Pneumonia, unspecified organism: Secondary | ICD-10-CM | POA: Diagnosis not present

## 2015-08-05 DIAGNOSIS — R0602 Shortness of breath: Secondary | ICD-10-CM | POA: Diagnosis not present

## 2015-08-05 DIAGNOSIS — R509 Fever, unspecified: Secondary | ICD-10-CM | POA: Diagnosis not present

## 2015-08-05 DIAGNOSIS — A419 Sepsis, unspecified organism: Secondary | ICD-10-CM | POA: Diagnosis not present

## 2015-08-05 DIAGNOSIS — R6510 Systemic inflammatory response syndrome (SIRS) of non-infectious origin without acute organ dysfunction: Secondary | ICD-10-CM | POA: Diagnosis not present

## 2015-08-05 DIAGNOSIS — E039 Hypothyroidism, unspecified: Secondary | ICD-10-CM | POA: Diagnosis not present

## 2015-08-05 DIAGNOSIS — R0902 Hypoxemia: Secondary | ICD-10-CM | POA: Diagnosis not present

## 2015-08-06 DIAGNOSIS — D72829 Elevated white blood cell count, unspecified: Secondary | ICD-10-CM | POA: Diagnosis not present

## 2015-08-06 DIAGNOSIS — R0902 Hypoxemia: Secondary | ICD-10-CM | POA: Diagnosis not present

## 2015-08-06 DIAGNOSIS — E039 Hypothyroidism, unspecified: Secondary | ICD-10-CM | POA: Diagnosis not present

## 2015-08-06 DIAGNOSIS — I10 Essential (primary) hypertension: Secondary | ICD-10-CM | POA: Diagnosis not present

## 2015-08-06 DIAGNOSIS — R509 Fever, unspecified: Secondary | ICD-10-CM | POA: Diagnosis not present

## 2015-08-06 DIAGNOSIS — R6510 Systemic inflammatory response syndrome (SIRS) of non-infectious origin without acute organ dysfunction: Secondary | ICD-10-CM | POA: Diagnosis not present

## 2015-08-06 DIAGNOSIS — I517 Cardiomegaly: Secondary | ICD-10-CM | POA: Diagnosis not present

## 2015-08-06 DIAGNOSIS — J189 Pneumonia, unspecified organism: Secondary | ICD-10-CM | POA: Diagnosis not present

## 2015-08-06 DIAGNOSIS — J45901 Unspecified asthma with (acute) exacerbation: Secondary | ICD-10-CM | POA: Diagnosis not present

## 2015-08-07 DIAGNOSIS — J309 Allergic rhinitis, unspecified: Secondary | ICD-10-CM | POA: Diagnosis not present

## 2015-08-07 DIAGNOSIS — E785 Hyperlipidemia, unspecified: Secondary | ICD-10-CM | POA: Diagnosis present

## 2015-08-07 DIAGNOSIS — R6510 Systemic inflammatory response syndrome (SIRS) of non-infectious origin without acute organ dysfunction: Secondary | ICD-10-CM | POA: Diagnosis not present

## 2015-08-07 DIAGNOSIS — R0902 Hypoxemia: Secondary | ICD-10-CM | POA: Diagnosis present

## 2015-08-07 DIAGNOSIS — I10 Essential (primary) hypertension: Secondary | ICD-10-CM | POA: Diagnosis present

## 2015-08-07 DIAGNOSIS — J40 Bronchitis, not specified as acute or chronic: Secondary | ICD-10-CM | POA: Diagnosis not present

## 2015-08-07 DIAGNOSIS — R509 Fever, unspecified: Secondary | ICD-10-CM | POA: Diagnosis not present

## 2015-08-07 DIAGNOSIS — Z9981 Dependence on supplemental oxygen: Secondary | ICD-10-CM | POA: Diagnosis not present

## 2015-08-07 DIAGNOSIS — D72829 Elevated white blood cell count, unspecified: Secondary | ICD-10-CM | POA: Diagnosis not present

## 2015-08-07 DIAGNOSIS — J189 Pneumonia, unspecified organism: Secondary | ICD-10-CM | POA: Diagnosis present

## 2015-08-07 DIAGNOSIS — R0602 Shortness of breath: Secondary | ICD-10-CM | POA: Diagnosis not present

## 2015-08-07 DIAGNOSIS — K5900 Constipation, unspecified: Secondary | ICD-10-CM | POA: Diagnosis present

## 2015-08-07 DIAGNOSIS — A419 Sepsis, unspecified organism: Secondary | ICD-10-CM | POA: Diagnosis present

## 2015-08-07 DIAGNOSIS — F329 Major depressive disorder, single episode, unspecified: Secondary | ICD-10-CM | POA: Diagnosis present

## 2015-08-07 DIAGNOSIS — J45901 Unspecified asthma with (acute) exacerbation: Secondary | ICD-10-CM | POA: Diagnosis present

## 2015-08-07 DIAGNOSIS — E039 Hypothyroidism, unspecified: Secondary | ICD-10-CM | POA: Diagnosis present

## 2015-08-15 DIAGNOSIS — R5383 Other fatigue: Secondary | ICD-10-CM | POA: Diagnosis not present

## 2015-08-15 DIAGNOSIS — R05 Cough: Secondary | ICD-10-CM | POA: Diagnosis not present

## 2015-08-15 DIAGNOSIS — J4541 Moderate persistent asthma with (acute) exacerbation: Secondary | ICD-10-CM | POA: Diagnosis not present

## 2015-08-15 DIAGNOSIS — I10 Essential (primary) hypertension: Secondary | ICD-10-CM | POA: Diagnosis not present

## 2015-08-15 DIAGNOSIS — J168 Pneumonia due to other specified infectious organisms: Secondary | ICD-10-CM | POA: Diagnosis not present

## 2015-08-16 DIAGNOSIS — R05 Cough: Secondary | ICD-10-CM | POA: Diagnosis not present

## 2015-08-16 DIAGNOSIS — R0609 Other forms of dyspnea: Secondary | ICD-10-CM | POA: Diagnosis not present

## 2015-08-16 DIAGNOSIS — J4520 Mild intermittent asthma, uncomplicated: Secondary | ICD-10-CM | POA: Diagnosis not present

## 2015-08-21 ENCOUNTER — Ambulatory Visit
Admission: RE | Admit: 2015-08-21 | Discharge: 2015-08-21 | Disposition: A | Discharge status: Home / Self Care | Payer: Medicare Other | Source: Ambulatory Visit | Attending: Internal Medicine | Admitting: Internal Medicine

## 2015-08-21 ENCOUNTER — Other Ambulatory Visit: Payer: Self-pay | Admitting: Internal Medicine

## 2015-08-21 DIAGNOSIS — I517 Cardiomegaly: Secondary | ICD-10-CM | POA: Diagnosis not present

## 2015-08-21 DIAGNOSIS — J189 Pneumonia, unspecified organism: Secondary | ICD-10-CM

## 2015-08-23 DIAGNOSIS — R2689 Other abnormalities of gait and mobility: Secondary | ICD-10-CM | POA: Diagnosis not present

## 2015-08-23 DIAGNOSIS — Z8701 Personal history of pneumonia (recurrent): Secondary | ICD-10-CM | POA: Diagnosis not present

## 2015-08-23 DIAGNOSIS — I739 Peripheral vascular disease, unspecified: Secondary | ICD-10-CM | POA: Diagnosis not present

## 2015-08-23 DIAGNOSIS — J4541 Moderate persistent asthma with (acute) exacerbation: Secondary | ICD-10-CM | POA: Diagnosis not present

## 2015-08-23 DIAGNOSIS — M6281 Muscle weakness (generalized): Secondary | ICD-10-CM | POA: Diagnosis not present

## 2015-08-23 DIAGNOSIS — M810 Age-related osteoporosis without current pathological fracture: Secondary | ICD-10-CM | POA: Diagnosis not present

## 2015-08-23 DIAGNOSIS — Z9981 Dependence on supplemental oxygen: Secondary | ICD-10-CM | POA: Diagnosis not present

## 2015-08-23 DIAGNOSIS — I10 Essential (primary) hypertension: Secondary | ICD-10-CM | POA: Diagnosis not present

## 2015-08-23 DIAGNOSIS — F329 Major depressive disorder, single episode, unspecified: Secondary | ICD-10-CM | POA: Diagnosis not present

## 2015-08-23 DIAGNOSIS — I4891 Unspecified atrial fibrillation: Secondary | ICD-10-CM | POA: Diagnosis not present

## 2015-08-24 DIAGNOSIS — J449 Chronic obstructive pulmonary disease, unspecified: Secondary | ICD-10-CM | POA: Diagnosis not present

## 2015-08-24 DIAGNOSIS — E039 Hypothyroidism, unspecified: Secondary | ICD-10-CM | POA: Diagnosis not present

## 2015-08-24 DIAGNOSIS — J168 Pneumonia due to other specified infectious organisms: Secondary | ICD-10-CM | POA: Diagnosis not present

## 2015-08-24 DIAGNOSIS — I499 Cardiac arrhythmia, unspecified: Secondary | ICD-10-CM | POA: Diagnosis not present

## 2015-08-24 DIAGNOSIS — B359 Dermatophytosis, unspecified: Secondary | ICD-10-CM | POA: Diagnosis not present

## 2015-08-24 DIAGNOSIS — I10 Essential (primary) hypertension: Secondary | ICD-10-CM | POA: Diagnosis not present

## 2015-08-25 DIAGNOSIS — I10 Essential (primary) hypertension: Secondary | ICD-10-CM | POA: Diagnosis not present

## 2015-08-25 DIAGNOSIS — I739 Peripheral vascular disease, unspecified: Secondary | ICD-10-CM | POA: Diagnosis not present

## 2015-08-25 DIAGNOSIS — J4541 Moderate persistent asthma with (acute) exacerbation: Secondary | ICD-10-CM | POA: Diagnosis not present

## 2015-08-25 DIAGNOSIS — I4891 Unspecified atrial fibrillation: Secondary | ICD-10-CM | POA: Diagnosis not present

## 2015-08-25 DIAGNOSIS — M6281 Muscle weakness (generalized): Secondary | ICD-10-CM | POA: Diagnosis not present

## 2015-08-25 DIAGNOSIS — R2689 Other abnormalities of gait and mobility: Secondary | ICD-10-CM | POA: Diagnosis not present

## 2015-08-29 DIAGNOSIS — I739 Peripheral vascular disease, unspecified: Secondary | ICD-10-CM | POA: Diagnosis not present

## 2015-08-29 DIAGNOSIS — M6281 Muscle weakness (generalized): Secondary | ICD-10-CM | POA: Diagnosis not present

## 2015-08-29 DIAGNOSIS — R2689 Other abnormalities of gait and mobility: Secondary | ICD-10-CM | POA: Diagnosis not present

## 2015-08-29 DIAGNOSIS — I4891 Unspecified atrial fibrillation: Secondary | ICD-10-CM | POA: Diagnosis not present

## 2015-08-29 DIAGNOSIS — I10 Essential (primary) hypertension: Secondary | ICD-10-CM | POA: Diagnosis not present

## 2015-08-29 DIAGNOSIS — J4541 Moderate persistent asthma with (acute) exacerbation: Secondary | ICD-10-CM | POA: Diagnosis not present

## 2015-08-31 DIAGNOSIS — R2689 Other abnormalities of gait and mobility: Secondary | ICD-10-CM | POA: Diagnosis not present

## 2015-08-31 DIAGNOSIS — I4891 Unspecified atrial fibrillation: Secondary | ICD-10-CM | POA: Diagnosis not present

## 2015-08-31 DIAGNOSIS — M6281 Muscle weakness (generalized): Secondary | ICD-10-CM | POA: Diagnosis not present

## 2015-08-31 DIAGNOSIS — J4541 Moderate persistent asthma with (acute) exacerbation: Secondary | ICD-10-CM | POA: Diagnosis not present

## 2015-08-31 DIAGNOSIS — I739 Peripheral vascular disease, unspecified: Secondary | ICD-10-CM | POA: Diagnosis not present

## 2015-08-31 DIAGNOSIS — I10 Essential (primary) hypertension: Secondary | ICD-10-CM | POA: Diagnosis not present

## 2015-09-01 DIAGNOSIS — R2689 Other abnormalities of gait and mobility: Secondary | ICD-10-CM | POA: Diagnosis not present

## 2015-09-01 DIAGNOSIS — M6281 Muscle weakness (generalized): Secondary | ICD-10-CM | POA: Diagnosis not present

## 2015-09-01 DIAGNOSIS — I4891 Unspecified atrial fibrillation: Secondary | ICD-10-CM | POA: Diagnosis not present

## 2015-09-01 DIAGNOSIS — J4541 Moderate persistent asthma with (acute) exacerbation: Secondary | ICD-10-CM | POA: Diagnosis not present

## 2015-09-01 DIAGNOSIS — I739 Peripheral vascular disease, unspecified: Secondary | ICD-10-CM | POA: Diagnosis not present

## 2015-09-01 DIAGNOSIS — I10 Essential (primary) hypertension: Secondary | ICD-10-CM | POA: Diagnosis not present

## 2015-09-04 DIAGNOSIS — R2689 Other abnormalities of gait and mobility: Secondary | ICD-10-CM | POA: Diagnosis not present

## 2015-09-04 DIAGNOSIS — I10 Essential (primary) hypertension: Secondary | ICD-10-CM | POA: Diagnosis not present

## 2015-09-04 DIAGNOSIS — I739 Peripheral vascular disease, unspecified: Secondary | ICD-10-CM | POA: Diagnosis not present

## 2015-09-04 DIAGNOSIS — M6281 Muscle weakness (generalized): Secondary | ICD-10-CM | POA: Diagnosis not present

## 2015-09-04 DIAGNOSIS — J4541 Moderate persistent asthma with (acute) exacerbation: Secondary | ICD-10-CM | POA: Diagnosis not present

## 2015-09-04 DIAGNOSIS — I4891 Unspecified atrial fibrillation: Secondary | ICD-10-CM | POA: Diagnosis not present

## 2015-09-07 DIAGNOSIS — J449 Chronic obstructive pulmonary disease, unspecified: Secondary | ICD-10-CM | POA: Diagnosis not present

## 2015-09-21 DIAGNOSIS — I10 Essential (primary) hypertension: Secondary | ICD-10-CM | POA: Diagnosis not present

## 2015-09-21 DIAGNOSIS — M6281 Muscle weakness (generalized): Secondary | ICD-10-CM | POA: Diagnosis not present

## 2015-09-21 DIAGNOSIS — J4541 Moderate persistent asthma with (acute) exacerbation: Secondary | ICD-10-CM | POA: Diagnosis not present

## 2015-09-21 DIAGNOSIS — I4891 Unspecified atrial fibrillation: Secondary | ICD-10-CM | POA: Diagnosis not present

## 2015-09-21 DIAGNOSIS — R2681 Unsteadiness on feet: Secondary | ICD-10-CM | POA: Diagnosis not present

## 2015-09-21 DIAGNOSIS — F329 Major depressive disorder, single episode, unspecified: Secondary | ICD-10-CM | POA: Diagnosis not present

## 2015-09-21 DIAGNOSIS — I4892 Unspecified atrial flutter: Secondary | ICD-10-CM | POA: Diagnosis not present

## 2015-09-21 DIAGNOSIS — I739 Peripheral vascular disease, unspecified: Secondary | ICD-10-CM | POA: Diagnosis not present

## 2015-09-21 DIAGNOSIS — Z8701 Personal history of pneumonia (recurrent): Secondary | ICD-10-CM | POA: Diagnosis not present

## 2015-09-25 DIAGNOSIS — I739 Peripheral vascular disease, unspecified: Secondary | ICD-10-CM | POA: Diagnosis not present

## 2015-09-25 DIAGNOSIS — I10 Essential (primary) hypertension: Secondary | ICD-10-CM | POA: Diagnosis not present

## 2015-09-25 DIAGNOSIS — R2681 Unsteadiness on feet: Secondary | ICD-10-CM | POA: Diagnosis not present

## 2015-09-25 DIAGNOSIS — M6281 Muscle weakness (generalized): Secondary | ICD-10-CM | POA: Diagnosis not present

## 2015-09-25 DIAGNOSIS — I4891 Unspecified atrial fibrillation: Secondary | ICD-10-CM | POA: Diagnosis not present

## 2015-09-25 DIAGNOSIS — J4541 Moderate persistent asthma with (acute) exacerbation: Secondary | ICD-10-CM | POA: Diagnosis not present

## 2015-09-26 DIAGNOSIS — I4891 Unspecified atrial fibrillation: Secondary | ICD-10-CM | POA: Diagnosis not present

## 2015-09-26 DIAGNOSIS — R2681 Unsteadiness on feet: Secondary | ICD-10-CM | POA: Diagnosis not present

## 2015-09-26 DIAGNOSIS — M6281 Muscle weakness (generalized): Secondary | ICD-10-CM | POA: Diagnosis not present

## 2015-09-26 DIAGNOSIS — I739 Peripheral vascular disease, unspecified: Secondary | ICD-10-CM | POA: Diagnosis not present

## 2015-09-26 DIAGNOSIS — I10 Essential (primary) hypertension: Secondary | ICD-10-CM | POA: Diagnosis not present

## 2015-09-26 DIAGNOSIS — J4541 Moderate persistent asthma with (acute) exacerbation: Secondary | ICD-10-CM | POA: Diagnosis not present

## 2015-09-27 DIAGNOSIS — I4891 Unspecified atrial fibrillation: Secondary | ICD-10-CM | POA: Diagnosis not present

## 2015-09-27 DIAGNOSIS — R2681 Unsteadiness on feet: Secondary | ICD-10-CM | POA: Diagnosis not present

## 2015-09-27 DIAGNOSIS — I739 Peripheral vascular disease, unspecified: Secondary | ICD-10-CM | POA: Diagnosis not present

## 2015-09-27 DIAGNOSIS — I10 Essential (primary) hypertension: Secondary | ICD-10-CM | POA: Diagnosis not present

## 2015-09-27 DIAGNOSIS — J4541 Moderate persistent asthma with (acute) exacerbation: Secondary | ICD-10-CM | POA: Diagnosis not present

## 2015-09-27 DIAGNOSIS — M6281 Muscle weakness (generalized): Secondary | ICD-10-CM | POA: Diagnosis not present

## 2015-09-28 DIAGNOSIS — J168 Pneumonia due to other specified infectious organisms: Secondary | ICD-10-CM | POA: Diagnosis not present

## 2015-09-28 DIAGNOSIS — I10 Essential (primary) hypertension: Secondary | ICD-10-CM | POA: Diagnosis not present

## 2015-09-28 DIAGNOSIS — J4541 Moderate persistent asthma with (acute) exacerbation: Secondary | ICD-10-CM | POA: Diagnosis not present

## 2015-09-28 DIAGNOSIS — E039 Hypothyroidism, unspecified: Secondary | ICD-10-CM | POA: Diagnosis not present

## 2015-09-28 DIAGNOSIS — I499 Cardiac arrhythmia, unspecified: Secondary | ICD-10-CM | POA: Diagnosis not present

## 2015-09-28 DIAGNOSIS — F330 Major depressive disorder, recurrent, mild: Secondary | ICD-10-CM | POA: Diagnosis not present

## 2015-09-28 DIAGNOSIS — J449 Chronic obstructive pulmonary disease, unspecified: Secondary | ICD-10-CM | POA: Diagnosis not present

## 2015-10-03 ENCOUNTER — Other Ambulatory Visit: Payer: Self-pay | Admitting: Internal Medicine

## 2015-10-03 DIAGNOSIS — I739 Peripheral vascular disease, unspecified: Secondary | ICD-10-CM | POA: Diagnosis not present

## 2015-10-03 DIAGNOSIS — R2681 Unsteadiness on feet: Secondary | ICD-10-CM | POA: Diagnosis not present

## 2015-10-03 DIAGNOSIS — Z1231 Encounter for screening mammogram for malignant neoplasm of breast: Secondary | ICD-10-CM

## 2015-10-03 DIAGNOSIS — J4541 Moderate persistent asthma with (acute) exacerbation: Secondary | ICD-10-CM | POA: Diagnosis not present

## 2015-10-03 DIAGNOSIS — M6281 Muscle weakness (generalized): Secondary | ICD-10-CM | POA: Diagnosis not present

## 2015-10-03 DIAGNOSIS — I4891 Unspecified atrial fibrillation: Secondary | ICD-10-CM | POA: Diagnosis not present

## 2015-10-03 DIAGNOSIS — I10 Essential (primary) hypertension: Secondary | ICD-10-CM | POA: Diagnosis not present

## 2015-10-04 DIAGNOSIS — R2681 Unsteadiness on feet: Secondary | ICD-10-CM | POA: Diagnosis not present

## 2015-10-04 DIAGNOSIS — I4891 Unspecified atrial fibrillation: Secondary | ICD-10-CM | POA: Diagnosis not present

## 2015-10-04 DIAGNOSIS — I739 Peripheral vascular disease, unspecified: Secondary | ICD-10-CM | POA: Diagnosis not present

## 2015-10-04 DIAGNOSIS — M6281 Muscle weakness (generalized): Secondary | ICD-10-CM | POA: Diagnosis not present

## 2015-10-04 DIAGNOSIS — J4541 Moderate persistent asthma with (acute) exacerbation: Secondary | ICD-10-CM | POA: Diagnosis not present

## 2015-10-04 DIAGNOSIS — I10 Essential (primary) hypertension: Secondary | ICD-10-CM | POA: Diagnosis not present

## 2015-10-05 DIAGNOSIS — J449 Chronic obstructive pulmonary disease, unspecified: Secondary | ICD-10-CM | POA: Diagnosis not present

## 2015-10-05 DIAGNOSIS — J168 Pneumonia due to other specified infectious organisms: Secondary | ICD-10-CM | POA: Diagnosis not present

## 2015-10-06 DIAGNOSIS — I4891 Unspecified atrial fibrillation: Secondary | ICD-10-CM | POA: Diagnosis not present

## 2015-10-06 DIAGNOSIS — R2681 Unsteadiness on feet: Secondary | ICD-10-CM | POA: Diagnosis not present

## 2015-10-06 DIAGNOSIS — M6281 Muscle weakness (generalized): Secondary | ICD-10-CM | POA: Diagnosis not present

## 2015-10-06 DIAGNOSIS — I10 Essential (primary) hypertension: Secondary | ICD-10-CM | POA: Diagnosis not present

## 2015-10-06 DIAGNOSIS — J4541 Moderate persistent asthma with (acute) exacerbation: Secondary | ICD-10-CM | POA: Diagnosis not present

## 2015-10-06 DIAGNOSIS — I739 Peripheral vascular disease, unspecified: Secondary | ICD-10-CM | POA: Diagnosis not present

## 2015-10-09 DIAGNOSIS — I4891 Unspecified atrial fibrillation: Secondary | ICD-10-CM | POA: Diagnosis not present

## 2015-10-09 DIAGNOSIS — R2681 Unsteadiness on feet: Secondary | ICD-10-CM | POA: Diagnosis not present

## 2015-10-09 DIAGNOSIS — J4541 Moderate persistent asthma with (acute) exacerbation: Secondary | ICD-10-CM | POA: Diagnosis not present

## 2015-10-09 DIAGNOSIS — M6281 Muscle weakness (generalized): Secondary | ICD-10-CM | POA: Diagnosis not present

## 2015-10-09 DIAGNOSIS — I10 Essential (primary) hypertension: Secondary | ICD-10-CM | POA: Diagnosis not present

## 2015-10-09 DIAGNOSIS — I739 Peripheral vascular disease, unspecified: Secondary | ICD-10-CM | POA: Diagnosis not present

## 2015-10-11 DIAGNOSIS — I4891 Unspecified atrial fibrillation: Secondary | ICD-10-CM | POA: Diagnosis not present

## 2015-10-11 DIAGNOSIS — R2681 Unsteadiness on feet: Secondary | ICD-10-CM | POA: Diagnosis not present

## 2015-10-11 DIAGNOSIS — J4541 Moderate persistent asthma with (acute) exacerbation: Secondary | ICD-10-CM | POA: Diagnosis not present

## 2015-10-11 DIAGNOSIS — M6281 Muscle weakness (generalized): Secondary | ICD-10-CM | POA: Diagnosis not present

## 2015-10-11 DIAGNOSIS — I739 Peripheral vascular disease, unspecified: Secondary | ICD-10-CM | POA: Diagnosis not present

## 2015-10-11 DIAGNOSIS — I10 Essential (primary) hypertension: Secondary | ICD-10-CM | POA: Diagnosis not present

## 2015-10-16 DIAGNOSIS — J4541 Moderate persistent asthma with (acute) exacerbation: Secondary | ICD-10-CM | POA: Diagnosis not present

## 2015-10-16 DIAGNOSIS — I10 Essential (primary) hypertension: Secondary | ICD-10-CM | POA: Diagnosis not present

## 2015-10-16 DIAGNOSIS — I739 Peripheral vascular disease, unspecified: Secondary | ICD-10-CM | POA: Diagnosis not present

## 2015-10-16 DIAGNOSIS — I4891 Unspecified atrial fibrillation: Secondary | ICD-10-CM | POA: Diagnosis not present

## 2015-10-16 DIAGNOSIS — R2681 Unsteadiness on feet: Secondary | ICD-10-CM | POA: Diagnosis not present

## 2015-10-16 DIAGNOSIS — M6281 Muscle weakness (generalized): Secondary | ICD-10-CM | POA: Diagnosis not present

## 2015-10-18 DIAGNOSIS — M6281 Muscle weakness (generalized): Secondary | ICD-10-CM | POA: Diagnosis not present

## 2015-10-18 DIAGNOSIS — I739 Peripheral vascular disease, unspecified: Secondary | ICD-10-CM | POA: Diagnosis not present

## 2015-10-18 DIAGNOSIS — I10 Essential (primary) hypertension: Secondary | ICD-10-CM | POA: Diagnosis not present

## 2015-10-18 DIAGNOSIS — I4891 Unspecified atrial fibrillation: Secondary | ICD-10-CM | POA: Diagnosis not present

## 2015-10-18 DIAGNOSIS — R2681 Unsteadiness on feet: Secondary | ICD-10-CM | POA: Diagnosis not present

## 2015-10-18 DIAGNOSIS — J4541 Moderate persistent asthma with (acute) exacerbation: Secondary | ICD-10-CM | POA: Diagnosis not present

## 2015-10-19 DIAGNOSIS — B351 Tinea unguium: Secondary | ICD-10-CM | POA: Diagnosis not present

## 2015-10-19 DIAGNOSIS — M79675 Pain in left toe(s): Secondary | ICD-10-CM | POA: Diagnosis not present

## 2015-10-20 DIAGNOSIS — I739 Peripheral vascular disease, unspecified: Secondary | ICD-10-CM | POA: Diagnosis not present

## 2015-10-20 DIAGNOSIS — I10 Essential (primary) hypertension: Secondary | ICD-10-CM | POA: Diagnosis not present

## 2015-10-20 DIAGNOSIS — R2681 Unsteadiness on feet: Secondary | ICD-10-CM | POA: Diagnosis not present

## 2015-10-20 DIAGNOSIS — I4891 Unspecified atrial fibrillation: Secondary | ICD-10-CM | POA: Diagnosis not present

## 2015-10-20 DIAGNOSIS — J4541 Moderate persistent asthma with (acute) exacerbation: Secondary | ICD-10-CM | POA: Diagnosis not present

## 2015-10-20 DIAGNOSIS — M6281 Muscle weakness (generalized): Secondary | ICD-10-CM | POA: Diagnosis not present

## 2015-10-23 DIAGNOSIS — I10 Essential (primary) hypertension: Secondary | ICD-10-CM | POA: Diagnosis not present

## 2015-10-23 DIAGNOSIS — M6281 Muscle weakness (generalized): Secondary | ICD-10-CM | POA: Diagnosis not present

## 2015-10-23 DIAGNOSIS — I739 Peripheral vascular disease, unspecified: Secondary | ICD-10-CM | POA: Diagnosis not present

## 2015-10-23 DIAGNOSIS — R2681 Unsteadiness on feet: Secondary | ICD-10-CM | POA: Diagnosis not present

## 2015-10-23 DIAGNOSIS — I4891 Unspecified atrial fibrillation: Secondary | ICD-10-CM | POA: Diagnosis not present

## 2015-10-23 DIAGNOSIS — J4541 Moderate persistent asthma with (acute) exacerbation: Secondary | ICD-10-CM | POA: Diagnosis not present

## 2015-10-25 DIAGNOSIS — I10 Essential (primary) hypertension: Secondary | ICD-10-CM | POA: Diagnosis not present

## 2015-10-25 DIAGNOSIS — R2681 Unsteadiness on feet: Secondary | ICD-10-CM | POA: Diagnosis not present

## 2015-10-25 DIAGNOSIS — M6281 Muscle weakness (generalized): Secondary | ICD-10-CM | POA: Diagnosis not present

## 2015-10-25 DIAGNOSIS — I739 Peripheral vascular disease, unspecified: Secondary | ICD-10-CM | POA: Diagnosis not present

## 2015-10-25 DIAGNOSIS — J4541 Moderate persistent asthma with (acute) exacerbation: Secondary | ICD-10-CM | POA: Diagnosis not present

## 2015-10-25 DIAGNOSIS — I4891 Unspecified atrial fibrillation: Secondary | ICD-10-CM | POA: Diagnosis not present

## 2015-10-26 ENCOUNTER — Other Ambulatory Visit: Payer: Self-pay | Admitting: Internal Medicine

## 2015-10-26 ENCOUNTER — Ambulatory Visit
Admission: RE | Admit: 2015-10-26 | Discharge: 2015-10-26 | Disposition: A | Discharge status: Home / Self Care | Payer: Medicare Other | Source: Ambulatory Visit | Attending: Internal Medicine | Admitting: Internal Medicine

## 2015-10-26 DIAGNOSIS — Z1231 Encounter for screening mammogram for malignant neoplasm of breast: Secondary | ICD-10-CM

## 2015-10-30 DIAGNOSIS — I739 Peripheral vascular disease, unspecified: Secondary | ICD-10-CM | POA: Diagnosis not present

## 2015-10-30 DIAGNOSIS — I10 Essential (primary) hypertension: Secondary | ICD-10-CM | POA: Diagnosis not present

## 2015-10-30 DIAGNOSIS — R2681 Unsteadiness on feet: Secondary | ICD-10-CM | POA: Diagnosis not present

## 2015-10-30 DIAGNOSIS — J4541 Moderate persistent asthma with (acute) exacerbation: Secondary | ICD-10-CM | POA: Diagnosis not present

## 2015-10-30 DIAGNOSIS — I4891 Unspecified atrial fibrillation: Secondary | ICD-10-CM | POA: Diagnosis not present

## 2015-10-30 DIAGNOSIS — M6281 Muscle weakness (generalized): Secondary | ICD-10-CM | POA: Diagnosis not present

## 2015-10-31 DIAGNOSIS — H6062 Unspecified chronic otitis externa, left ear: Secondary | ICD-10-CM | POA: Diagnosis not present

## 2015-10-31 DIAGNOSIS — H6123 Impacted cerumen, bilateral: Secondary | ICD-10-CM | POA: Diagnosis not present

## 2015-10-31 DIAGNOSIS — H903 Sensorineural hearing loss, bilateral: Secondary | ICD-10-CM | POA: Diagnosis not present

## 2015-11-01 DIAGNOSIS — J4541 Moderate persistent asthma with (acute) exacerbation: Secondary | ICD-10-CM | POA: Diagnosis not present

## 2015-11-01 DIAGNOSIS — I10 Essential (primary) hypertension: Secondary | ICD-10-CM | POA: Diagnosis not present

## 2015-11-01 DIAGNOSIS — M6281 Muscle weakness (generalized): Secondary | ICD-10-CM | POA: Diagnosis not present

## 2015-11-01 DIAGNOSIS — I4891 Unspecified atrial fibrillation: Secondary | ICD-10-CM | POA: Diagnosis not present

## 2015-11-01 DIAGNOSIS — R2681 Unsteadiness on feet: Secondary | ICD-10-CM | POA: Diagnosis not present

## 2015-11-01 DIAGNOSIS — I739 Peripheral vascular disease, unspecified: Secondary | ICD-10-CM | POA: Diagnosis not present

## 2015-11-08 DIAGNOSIS — R0602 Shortness of breath: Secondary | ICD-10-CM | POA: Diagnosis not present

## 2015-11-23 DIAGNOSIS — J449 Chronic obstructive pulmonary disease, unspecified: Secondary | ICD-10-CM | POA: Diagnosis not present

## 2015-11-23 DIAGNOSIS — R0602 Shortness of breath: Secondary | ICD-10-CM | POA: Diagnosis not present

## 2015-11-29 DIAGNOSIS — M810 Age-related osteoporosis without current pathological fracture: Secondary | ICD-10-CM | POA: Diagnosis not present

## 2015-12-21 DIAGNOSIS — I10 Essential (primary) hypertension: Secondary | ICD-10-CM | POA: Diagnosis not present

## 2015-12-21 DIAGNOSIS — M810 Age-related osteoporosis without current pathological fracture: Secondary | ICD-10-CM | POA: Diagnosis not present

## 2015-12-21 DIAGNOSIS — I499 Cardiac arrhythmia, unspecified: Secondary | ICD-10-CM | POA: Diagnosis not present

## 2015-12-21 DIAGNOSIS — J4541 Moderate persistent asthma with (acute) exacerbation: Secondary | ICD-10-CM | POA: Diagnosis not present

## 2015-12-21 DIAGNOSIS — Z9981 Dependence on supplemental oxygen: Secondary | ICD-10-CM | POA: Diagnosis not present

## 2015-12-21 DIAGNOSIS — Z0001 Encounter for general adult medical examination with abnormal findings: Secondary | ICD-10-CM | POA: Diagnosis not present

## 2015-12-21 DIAGNOSIS — E782 Mixed hyperlipidemia: Secondary | ICD-10-CM | POA: Diagnosis not present

## 2015-12-21 DIAGNOSIS — E039 Hypothyroidism, unspecified: Secondary | ICD-10-CM | POA: Diagnosis not present

## 2015-12-26 ENCOUNTER — Other Ambulatory Visit: Payer: Self-pay

## 2016-01-04 DIAGNOSIS — Z961 Presence of intraocular lens: Secondary | ICD-10-CM | POA: Diagnosis not present

## 2016-01-25 DIAGNOSIS — I499 Cardiac arrhythmia, unspecified: Secondary | ICD-10-CM | POA: Diagnosis not present

## 2016-01-25 DIAGNOSIS — Z5181 Encounter for therapeutic drug level monitoring: Secondary | ICD-10-CM | POA: Diagnosis not present

## 2016-01-25 DIAGNOSIS — E039 Hypothyroidism, unspecified: Secondary | ICD-10-CM | POA: Diagnosis not present

## 2016-01-31 DIAGNOSIS — Z23 Encounter for immunization: Secondary | ICD-10-CM | POA: Diagnosis not present

## 2016-02-13 DIAGNOSIS — H903 Sensorineural hearing loss, bilateral: Secondary | ICD-10-CM | POA: Diagnosis not present

## 2016-02-13 DIAGNOSIS — H6062 Unspecified chronic otitis externa, left ear: Secondary | ICD-10-CM | POA: Diagnosis not present

## 2016-02-13 DIAGNOSIS — H6123 Impacted cerumen, bilateral: Secondary | ICD-10-CM | POA: Diagnosis not present

## 2016-04-18 DIAGNOSIS — I10 Essential (primary) hypertension: Secondary | ICD-10-CM | POA: Diagnosis not present

## 2016-04-18 DIAGNOSIS — J069 Acute upper respiratory infection, unspecified: Secondary | ICD-10-CM | POA: Diagnosis not present

## 2016-04-18 DIAGNOSIS — J4541 Moderate persistent asthma with (acute) exacerbation: Secondary | ICD-10-CM | POA: Diagnosis not present

## 2016-04-24 DIAGNOSIS — I10 Essential (primary) hypertension: Secondary | ICD-10-CM | POA: Diagnosis not present

## 2016-04-24 DIAGNOSIS — J4541 Moderate persistent asthma with (acute) exacerbation: Secondary | ICD-10-CM | POA: Diagnosis not present

## 2016-04-24 DIAGNOSIS — I499 Cardiac arrhythmia, unspecified: Secondary | ICD-10-CM | POA: Diagnosis not present

## 2016-04-24 DIAGNOSIS — J300 Vasomotor rhinitis: Secondary | ICD-10-CM | POA: Diagnosis not present

## 2016-04-24 DIAGNOSIS — J069 Acute upper respiratory infection, unspecified: Secondary | ICD-10-CM | POA: Diagnosis not present

## 2016-05-23 DIAGNOSIS — J449 Chronic obstructive pulmonary disease, unspecified: Secondary | ICD-10-CM | POA: Diagnosis not present

## 2016-05-23 DIAGNOSIS — Z9981 Dependence on supplemental oxygen: Secondary | ICD-10-CM | POA: Diagnosis not present

## 2016-05-23 DIAGNOSIS — R0602 Shortness of breath: Secondary | ICD-10-CM | POA: Diagnosis not present

## 2016-05-28 DIAGNOSIS — H6062 Unspecified chronic otitis externa, left ear: Secondary | ICD-10-CM | POA: Diagnosis not present

## 2016-05-28 DIAGNOSIS — H6123 Impacted cerumen, bilateral: Secondary | ICD-10-CM | POA: Diagnosis not present

## 2016-06-19 DIAGNOSIS — M810 Age-related osteoporosis without current pathological fracture: Secondary | ICD-10-CM | POA: Diagnosis not present

## 2016-07-04 DIAGNOSIS — M810 Age-related osteoporosis without current pathological fracture: Secondary | ICD-10-CM | POA: Diagnosis not present

## 2016-07-10 DIAGNOSIS — I8393 Asymptomatic varicose veins of bilateral lower extremities: Secondary | ICD-10-CM | POA: Diagnosis not present

## 2016-07-10 DIAGNOSIS — L821 Other seborrheic keratosis: Secondary | ICD-10-CM | POA: Diagnosis not present

## 2016-07-10 DIAGNOSIS — L820 Inflamed seborrheic keratosis: Secondary | ICD-10-CM | POA: Diagnosis not present

## 2016-07-10 DIAGNOSIS — Z85828 Personal history of other malignant neoplasm of skin: Secondary | ICD-10-CM | POA: Diagnosis not present

## 2016-07-10 DIAGNOSIS — L720 Epidermal cyst: Secondary | ICD-10-CM | POA: Diagnosis not present

## 2016-07-10 DIAGNOSIS — D1800 Hemangioma unspecified site: Secondary | ICD-10-CM | POA: Diagnosis not present

## 2016-07-10 DIAGNOSIS — D229 Melanocytic nevi, unspecified: Secondary | ICD-10-CM | POA: Diagnosis not present

## 2016-07-10 DIAGNOSIS — Z1283 Encounter for screening for malignant neoplasm of skin: Secondary | ICD-10-CM | POA: Diagnosis not present

## 2016-07-10 DIAGNOSIS — I8310 Varicose veins of unspecified lower extremity with inflammation: Secondary | ICD-10-CM | POA: Diagnosis not present

## 2016-07-10 DIAGNOSIS — I781 Nevus, non-neoplastic: Secondary | ICD-10-CM | POA: Diagnosis not present

## 2016-07-10 DIAGNOSIS — L812 Freckles: Secondary | ICD-10-CM | POA: Diagnosis not present

## 2016-07-10 DIAGNOSIS — L578 Other skin changes due to chronic exposure to nonionizing radiation: Secondary | ICD-10-CM | POA: Diagnosis not present

## 2016-08-22 ENCOUNTER — Emergency Department: Payer: Medicare Other

## 2016-08-22 ENCOUNTER — Encounter: Payer: Self-pay | Admitting: *Deleted

## 2016-08-22 ENCOUNTER — Emergency Department
Admission: EM | Admit: 2016-08-22 | Discharge: 2016-08-22 | Disposition: A | Discharge status: Home / Self Care | Payer: Medicare Other | Attending: Emergency Medicine | Admitting: Emergency Medicine

## 2016-08-22 DIAGNOSIS — L03115 Cellulitis of right lower limb: Secondary | ICD-10-CM

## 2016-08-22 DIAGNOSIS — M79661 Pain in right lower leg: Secondary | ICD-10-CM | POA: Diagnosis not present

## 2016-08-22 DIAGNOSIS — M7989 Other specified soft tissue disorders: Secondary | ICD-10-CM | POA: Diagnosis not present

## 2016-08-22 HISTORY — DX: Phlebitis and thrombophlebitis of unspecified site: I80.9

## 2016-08-22 HISTORY — DX: Acute embolism and thrombosis of unspecified deep veins of unspecified lower extremity: I82.409

## 2016-08-22 LAB — CBC WITH DIFFERENTIAL/PLATELET
BASOS ABS: 0.1 10*3/uL (ref 0–0.1)
Basophils Relative: 1 %
EOS PCT: 6 %
Eosinophils Absolute: 0.6 10*3/uL (ref 0–0.7)
HCT: 43.2 % (ref 35.0–47.0)
Hemoglobin: 14.8 g/dL (ref 12.0–16.0)
LYMPHS ABS: 2.4 10*3/uL (ref 1.0–3.6)
LYMPHS PCT: 26 %
MCH: 32.7 pg (ref 26.0–34.0)
MCHC: 34.3 g/dL (ref 32.0–36.0)
MCV: 95.4 fL (ref 80.0–100.0)
MONO ABS: 0.9 10*3/uL (ref 0.2–0.9)
MONOS PCT: 10 %
Neutro Abs: 5.2 10*3/uL (ref 1.4–6.5)
Neutrophils Relative %: 57 %
PLATELETS: 183 10*3/uL (ref 150–440)
RBC: 4.52 MIL/uL (ref 3.80–5.20)
RDW: 13.4 % (ref 11.5–14.5)
WBC: 9.3 10*3/uL (ref 3.6–11.0)

## 2016-08-22 LAB — BASIC METABOLIC PANEL
Anion gap: 7 (ref 5–15)
BUN: 15 mg/dL (ref 6–20)
CALCIUM: 9.5 mg/dL (ref 8.9–10.3)
CO2: 34 mmol/L — AB (ref 22–32)
CREATININE: 0.32 mg/dL — AB (ref 0.44–1.00)
Chloride: 97 mmol/L — ABNORMAL LOW (ref 101–111)
GFR calc Af Amer: >60 mL/min (ref >60)
GFR calc non Af Amer: >60 mL/min (ref >60)
GLUCOSE: 107 mg/dL — AB (ref 65–99)
Potassium: 4.5 mmol/L (ref 3.5–5.1)
Sodium: 138 mmol/L (ref 135–145)

## 2016-08-22 LAB — LACTIC ACID, PLASMA: Lactic Acid, Venous: 0.8 mmol/L (ref 0.5–1.9)

## 2016-08-22 MED ORDER — DOXYCYCLINE HYCLATE 100 MG PO CAPS: 100.0000 mg | ORAL_CAPSULE | Freq: Two times a day (BID) | ORAL | 0 refills | Status: AC

## 2016-08-22 MED ORDER — DOXYCYCLINE HYCLATE 100 MG PO TABS
100.0000 mg | ORAL_TABLET | Freq: Once | ORAL | Status: AC
  Administered 2016-08-22: 100 mg via ORAL
  Filled 2016-08-22: qty 1

## 2016-08-22 NOTE — ED Notes (Signed)
This RN to bedside at this time. Apologized for delay, explained got caught up with critical patient. Pt and daughter state understanding. Visualized in NAD at this time. Will continue to monitor for further patient needs.

## 2016-08-22 NOTE — ED Provider Notes (Signed)
[facility] Emergency Department Provider Note  ____________________________________________  Time seen: Approximately 5:41 PM  I have reviewed the triage vital signs and the nursing notes.   HISTORY  Chief Complaint Leg Swelling   HPI Ashley Weiss is a 83 y.o. female with a history of DVT not on anticoagulation and phlebitis who presents for evaluation of right leg pain and swelling. Patient reports 2-3 days of right knee pain located in the anterior aspect of her knee, moderate, worse with weightbearing and ambulation, constant and nonradiating. Yesterday she noticed that her right leg started swelling. The swelling has gotten progressively worse. Since arriving to the emergency room she's noticed that the skin on the anterior tibia started turning red. No fever or chills, no nausea or vomiting, no changes in appetite. Patient tells me that she has had a history of DVT in the past. According to the daughter she does not believe patient has ever had blood clots and does not remember patient ever being on blood thinners. Patient denies chest pain or shortness of breath.  Past Medical History:  Diagnosis Date  . DVT (deep venous thrombosis) ([place])   . Phlebitis     There are no active problems to display for this patient.   Past Surgical History:  Procedure Laterality Date  . BREAST CYST ASPIRATION Left 20+ yrs ago    Prior to Admission medications   Medication Sig Start Date End Date Taking? Authorizing Provider  doxycycline (VIBRAMYCIN) 100 MG capsule Take 1 capsule (100 mg total) by mouth 2 (two) times daily. 08/22/16 09/01/16  Rudene Re, MD    Allergies Biaxin [clarithromycin]; Ciprofloxacin; Hydralazine; and Meloxicam  Family History  Problem Relation Age of Onset  . Breast cancer Neg Hx     Social History Social History  Substance Use Topics  . Smoking status: Never Smoker  . Smokeless tobacco: Never Used  . Alcohol use No     Review of Systems  Constitutional: Negative for fever. Eyes: Negative for visual changes. ENT: Negative for sore throat. Neck: No neck pain  Cardiovascular: Negative for chest pain. Respiratory: Negative for shortness of breath. Gastrointestinal: Negative for abdominal pain, vomiting or diarrhea. Genitourinary: Negative for dysuria. Musculoskeletal: Negative for back pain. + RLE pain swelling Skin: Negative for rash. Neurological: Negative for headaches, weakness or numbness. Psych: No SI or HI  ____________________________________________   PHYSICAL EXAM:  VITAL SIGNS: ED Triage Vitals  Enc Vitals Group     BP 08/22/16 1603 (!) 143/112     Pulse Rate 08/22/16 1603 69     Resp 08/22/16 1603 16     Temp 08/22/16 1603 98.6 F (37 C)     Temp Source 08/22/16 1603 Oral     SpO2 08/22/16 1603 96 %     Weight 08/22/16 1603 138 lb (62.6 kg)     Height 08/22/16 1603 5\' 4"  (1.626 m)     Head Circumference --      Peak Flow --      Pain Score 08/22/16 1604 7     Pain Loc --      Pain Edu? --      Excl. in [place]? --     Constitutional: Alert and oriented. Well appearing and in no apparent distress. HEENT:      Head: Normocephalic and atraumatic.         Eyes: Conjunctivae are normal. Sclera is non-icteric. EOMI. PERRL      Mouth/Throat: Mucous membranes are moist.  Neck: Supple with no signs of meningismus. Cardiovascular: Regular rate and rhythm. No murmurs, gallops, or rubs. 2+ symmetrical distal pulses are present in all extremities. No JVD. Respiratory: Normal respiratory effort. Lungs are clear to auscultation bilaterally. No wheezes, crackles, or rhonchi.  Gastrointestinal: Soft, non tender, and non distended with positive bowel sounds. No rebound or guarding. Musculoskeletal: 1+ pitting edema on the RLE, no edema on the L. Warmth and erythema located in the anterior shin area. Strong distal pulses Neurologic: Normal speech and language. Face is symmetric. Moving  all extremities. No gross focal neurologic deficits are appreciated. Skin: Skin is warm, dry and intact. No rash noted. Psychiatric: Mood and affect are normal. Speech and behavior are normal.  ____________________________________________   LABS (all labs ordered are listed, but only abnormal results are displayed)  Labs Reviewed  BASIC METABOLIC PANEL - Abnormal; Notable for the following:       Result Value   Chloride 97 (*)    CO2 34 (*)    Glucose, Bld 107 (*)    Creatinine, Ser 0.32 (*)    All other components within normal limits  CBC WITH DIFFERENTIAL/PLATELET  LACTIC ACID, PLASMA   ____________________________________________  EKG  none  ____________________________________________  RADIOLOGY  Doppler:  1. No deep venous thrombosis. 2. 8 superficial varicosities. 3. Slight decrease in size of a popliteal cyst. ____________________________________________   PROCEDURES  Procedure(s) performed: None Procedures Critical Care performed:  None ____________________________________________   INITIAL IMPRESSION / ASSESSMENT AND PLAN / ED COURSE  83 y.o. female with a history of DVT not on anticoagulation and phlebitis who presents for evaluation of right leg pain and swelling. Doppler study negative for DVT. Presentation concerning for cellulitis. We'll check basic blood work. Patient is afebrile with no evidence of sepsis. Will start patient on doxycycline.   ED COURSE: Labs within normal limits. No evidence of sepsis. Patient was started on doxycycline and was discharged home with a prescription for 10 days. She has close follow-up with PCP scheduled for the beginning of next week. Recommended she return to the emergency room if redness is spreading or if she develops a fever.  Pertinent labs & imaging results that were available during my care of the patient were reviewed by me and considered in my medical decision making (see chart for  details).    ____________________________________________   FINAL CLINICAL IMPRESSION(S) / ED DIAGNOSES  Final diagnoses:  Cellulitis of right lower extremity      NEW MEDICATIONS STARTED DURING THIS VISIT:  Discharge Medication List as of 08/22/2016  7:40 PM    START taking these medications   Details  doxycycline (VIBRAMYCIN) 100 MG capsule Take 1 capsule (100 mg total) by mouth 2 (two) times daily., Starting Thu 08/22/2016, Until Sun 09/01/2016, Print         Note:  This document was prepared using Dragon voice recognition software and may include unintentional dictation errors.    Rudene Re, MD 08/22/16 2030

## 2016-08-22 NOTE — ED Notes (Signed)
Pt returned From [place]. Pt assisted into a gown by her daughter. Will continue to monitor for further patient needs.

## 2016-08-22 NOTE — ED Notes (Signed)
NAD noted at this time. Pt resting in bed with daughter at bedside. Explained to patient would be giving report to night shift nurse, pt and daughter state understanding. Will continue to monitor for further patient needs.

## 2016-08-22 NOTE — ED Triage Notes (Signed)
Pt sent from [facility] clinic after reporting right calf pain and swelling. Pt reports hx of DVT. Tenderness upon palpation of calf.

## 2016-08-27 DIAGNOSIS — H6063 Unspecified chronic otitis externa, bilateral: Secondary | ICD-10-CM | POA: Diagnosis not present

## 2016-08-27 DIAGNOSIS — H6121 Impacted cerumen, right ear: Secondary | ICD-10-CM | POA: Diagnosis not present

## 2016-08-28 DIAGNOSIS — I10 Essential (primary) hypertension: Secondary | ICD-10-CM | POA: Diagnosis not present

## 2016-08-28 DIAGNOSIS — L03115 Cellulitis of right lower limb: Secondary | ICD-10-CM | POA: Diagnosis not present

## 2016-08-28 DIAGNOSIS — J449 Chronic obstructive pulmonary disease, unspecified: Secondary | ICD-10-CM | POA: Diagnosis not present

## 2016-08-28 DIAGNOSIS — I83893 Varicose veins of bilateral lower extremities with other complications: Secondary | ICD-10-CM | POA: Diagnosis not present

## 2016-09-25 ENCOUNTER — Other Ambulatory Visit: Payer: Self-pay | Admitting: Internal Medicine

## 2016-09-25 DIAGNOSIS — Z1231 Encounter for screening mammogram for malignant neoplasm of breast: Secondary | ICD-10-CM

## 2016-10-24 DIAGNOSIS — R21 Rash and other nonspecific skin eruption: Secondary | ICD-10-CM | POA: Diagnosis not present

## 2016-10-30 ENCOUNTER — Ambulatory Visit
Admission: RE | Admit: 2016-10-30 | Discharge: 2016-10-30 | Disposition: A | Discharge status: Home / Self Care | Payer: Medicare Other | Source: Ambulatory Visit | Attending: Internal Medicine | Admitting: Internal Medicine

## 2016-10-30 DIAGNOSIS — Z1231 Encounter for screening mammogram for malignant neoplasm of breast: Secondary | ICD-10-CM | POA: Insufficient documentation

## 2016-10-30 HISTORY — DX: Malignant (primary) neoplasm, unspecified: C80.1

## 2016-11-28 DIAGNOSIS — E039 Hypothyroidism, unspecified: Secondary | ICD-10-CM | POA: Diagnosis not present

## 2016-11-28 DIAGNOSIS — I499 Cardiac arrhythmia, unspecified: Secondary | ICD-10-CM | POA: Diagnosis not present

## 2016-11-28 DIAGNOSIS — I10 Essential (primary) hypertension: Secondary | ICD-10-CM | POA: Diagnosis not present

## 2016-11-28 DIAGNOSIS — J4541 Moderate persistent asthma with (acute) exacerbation: Secondary | ICD-10-CM | POA: Diagnosis not present

## 2016-11-28 DIAGNOSIS — M810 Age-related osteoporosis without current pathological fracture: Secondary | ICD-10-CM | POA: Diagnosis not present

## 2016-11-28 DIAGNOSIS — J300 Vasomotor rhinitis: Secondary | ICD-10-CM | POA: Diagnosis not present

## 2016-12-31 DIAGNOSIS — H6123 Impacted cerumen, bilateral: Secondary | ICD-10-CM | POA: Diagnosis not present

## 2016-12-31 DIAGNOSIS — H6062 Unspecified chronic otitis externa, left ear: Secondary | ICD-10-CM | POA: Diagnosis not present

## 2017-01-07 DIAGNOSIS — H26493 Other secondary cataract, bilateral: Secondary | ICD-10-CM | POA: Diagnosis not present

## 2017-01-15 DIAGNOSIS — M8588 Other specified disorders of bone density and structure, other site: Secondary | ICD-10-CM | POA: Diagnosis not present

## 2017-01-15 DIAGNOSIS — M810 Age-related osteoporosis without current pathological fracture: Secondary | ICD-10-CM | POA: Diagnosis not present

## 2017-01-22 DIAGNOSIS — M810 Age-related osteoporosis without current pathological fracture: Secondary | ICD-10-CM | POA: Diagnosis not present

## 2017-02-12 DIAGNOSIS — Z23 Encounter for immunization: Secondary | ICD-10-CM | POA: Diagnosis not present

## 2017-03-11 DIAGNOSIS — I10 Essential (primary) hypertension: Secondary | ICD-10-CM | POA: Diagnosis not present

## 2017-03-11 DIAGNOSIS — M25562 Pain in left knee: Secondary | ICD-10-CM | POA: Diagnosis not present

## 2017-03-11 DIAGNOSIS — Z0001 Encounter for general adult medical examination with abnormal findings: Secondary | ICD-10-CM | POA: Diagnosis not present

## 2017-03-11 DIAGNOSIS — I499 Cardiac arrhythmia, unspecified: Secondary | ICD-10-CM | POA: Diagnosis not present

## 2017-03-11 DIAGNOSIS — E039 Hypothyroidism, unspecified: Secondary | ICD-10-CM | POA: Diagnosis not present

## 2017-03-11 DIAGNOSIS — J449 Chronic obstructive pulmonary disease, unspecified: Secondary | ICD-10-CM | POA: Diagnosis not present

## 2017-03-19 ENCOUNTER — Other Ambulatory Visit: Payer: Self-pay | Admitting: Nurse Practitioner

## 2017-03-19 ENCOUNTER — Other Ambulatory Visit: Payer: Self-pay | Admitting: Internal Medicine

## 2017-03-19 DIAGNOSIS — R262 Difficulty in walking, not elsewhere classified: Secondary | ICD-10-CM

## 2017-03-19 DIAGNOSIS — R110 Nausea: Secondary | ICD-10-CM

## 2017-03-19 DIAGNOSIS — M25562 Pain in left knee: Secondary | ICD-10-CM

## 2017-03-26 DIAGNOSIS — M25562 Pain in left knee: Secondary | ICD-10-CM | POA: Diagnosis not present

## 2017-04-22 ENCOUNTER — Other Ambulatory Visit: Payer: Self-pay | Admitting: Internal Medicine

## 2017-04-25 ENCOUNTER — Other Ambulatory Visit: Payer: Self-pay

## 2017-04-25 MED ORDER — SERTRALINE HCL 100 MG PO TABS: 100.0000 mg | ORAL_TABLET | Freq: Every day | ORAL | 0 refills | Status: DC

## 2017-04-25 MED ORDER — DIGOXIN 250 MCG PO TABS: 0.2500 mg | ORAL_TABLET | Freq: Every day | ORAL | 0 refills | Status: DC

## 2017-05-13 DIAGNOSIS — H6123 Impacted cerumen, bilateral: Secondary | ICD-10-CM | POA: Diagnosis not present

## 2017-05-13 DIAGNOSIS — H903 Sensorineural hearing loss, bilateral: Secondary | ICD-10-CM | POA: Diagnosis not present

## 2017-05-13 DIAGNOSIS — H6062 Unspecified chronic otitis externa, left ear: Secondary | ICD-10-CM | POA: Diagnosis not present

## 2017-05-28 ENCOUNTER — Encounter: Payer: Self-pay | Admitting: Internal Medicine

## 2017-05-28 ENCOUNTER — Ambulatory Visit (INDEPENDENT_AMBULATORY_CARE_PROVIDER_SITE_OTHER): Payer: Medicare Other | Admitting: Internal Medicine

## 2017-05-28 VITALS — BP 145/68 | HR 64 | Resp 16 | Ht 63.0 in | Wt 140.0 lb

## 2017-05-28 DIAGNOSIS — E039 Hypothyroidism, unspecified: Secondary | ICD-10-CM

## 2017-05-28 DIAGNOSIS — I10 Essential (primary) hypertension: Secondary | ICD-10-CM

## 2017-05-28 DIAGNOSIS — M25562 Pain in left knee: Secondary | ICD-10-CM | POA: Insufficient documentation

## 2017-05-28 DIAGNOSIS — I499 Cardiac arrhythmia, unspecified: Secondary | ICD-10-CM | POA: Diagnosis not present

## 2017-05-28 DIAGNOSIS — R55 Syncope and collapse: Secondary | ICD-10-CM

## 2017-05-28 DIAGNOSIS — R238 Other skin changes: Secondary | ICD-10-CM | POA: Diagnosis not present

## 2017-05-28 DIAGNOSIS — W19XXXA Unspecified fall, initial encounter: Secondary | ICD-10-CM

## 2017-05-28 DIAGNOSIS — R002 Palpitations: Secondary | ICD-10-CM | POA: Diagnosis not present

## 2017-05-28 DIAGNOSIS — J449 Chronic obstructive pulmonary disease, unspecified: Secondary | ICD-10-CM | POA: Insufficient documentation

## 2017-05-28 DIAGNOSIS — R233 Spontaneous ecchymoses: Secondary | ICD-10-CM

## 2017-05-28 NOTE — Progress Notes (Signed)
[facility] [place], [place] [address]  Internal MEDICINE  Office Visit Note  Patient Name: Ashley Weiss  [id number]  [id number]  Date of Service: 05/28/2017  Chief Complaint  Patient presents with  . Hypertension    measuring higher than she did today  . Fall    bruise on her right hip  . Medication Management    how to use her breathing treatment    Hypertension  This is a chronic (has been elevated ) problem. The current episode started 1 to 4 weeks ago. The problem has been waxing and waning since onset. The problem is controlled. Pertinent negatives include no chest pain, neck pain, palpitations or shortness of breath. There are no associated agents to hypertension.  Fall  The accident occurred 3 to 5 days ago. The fall occurred while walking. The point of impact was the right hip (Bruise). Associated symptoms include tingling. Pertinent negatives include no nausea or numbness. She has tried ice and rest for the symptoms. The treatment provided significant relief.   Pt is here for routine follow up.  She did not feel dizzy, Happened on Sunday, did not tell her daughters. bp is elevated   Current Medication: Outpatient Encounter Medications as of 05/28/2017  Medication Sig  . albuterol (PROVENTIL HFA;VENTOLIN HFA) 108 (90 Base) MCG/ACT inhaler Inhale 2 puffs into the lungs every 6 (six) hours as needed for wheezing or shortness of breath.  Marland Kitchen aspirin 81 MG tablet Take 81 mg by mouth daily.  . digoxin (LANOXIN) 0.25 MG tablet Take 1 tablet (0.25 mg total) by mouth daily. Take estra 0.5 tab on Tuesday and Friday.  . fluticasone (FLONASE) 50 MCG/ACT nasal spray Place 1 spray into both nostrils daily. Use 1-2 sprays daily.  . fluticasone (FLOVENT HFA) 110 MCG/ACT inhaler Inhale 2 puffs into the lungs 2 (two) times daily.  Marland Kitchen ipratropium-albuterol (DUONEB) 0.5-2.5 (3) MG/3ML SOLN Take 3 mLs by nebulization daily.  Marland Kitchen levothyroxine (SYNTHROID, LEVOTHROID) 25  MCG tablet TAKE 1 DAILY BY MOUTH IN THE MORNING ON AN EMPTY STOMACH  . metoprolol tartrate (LOPRESSOR) 100 MG tablet Take 100 mg by mouth 2 (two) times daily. Take 1.5 tablets twice daily.  . montelukast (SINGULAIR) 10 MG tablet Take 10 mg by mouth daily.  Marland Kitchen olmesartan (BENICAR) 40 MG tablet Take 40 mg by mouth daily.  . OXYGEN Inhale 2 L into the lungs every evening. Sleep with nasal cannula w/ O2 at 2lpm.  . sertraline (ZOLOFT) 100 MG tablet Take 1 tablet (100 mg total) by mouth daily.  . simvastatin (ZOCOR) 20 MG tablet Take 20 mg by mouth daily with supper.   No facility-administered encounter medications on file as of 05/28/2017.     Surgical History: Past Surgical History:  Procedure Laterality Date  . BREAST CYST ASPIRATION Left 20+ yrs ago  . CATARACT EXTRACTION Bilateral 2005,2009  . cyst removal-right shoulder  1972  . otosclerosis Left 1988   hardening of bone, other 1989 redone  . THYROIDECTOMY  1970   nodule and 1/2 bridge removal  . WRIST SURGERY Left 11/29/2009    Medical History: Past Medical History:  Diagnosis Date  . Arthritis   . Asthma   . Cancer ([place])    skin  . Depression   . DVT (deep venous thrombosis) ([place])   . Hyperlipidemia   . Hypertension   . Phlebitis     Family History: Family History  Problem Relation Age of Onset  . Osteoarthritis Mother   .  Depression Father   . Breast cancer Neg Hx     Social History   Socioeconomic History  . Marital status: Widowed    Spouse name: Not on file  . Number of children: Not on file  . Years of education: Not on file  . Highest education level: Not on file  Social Needs  . Financial resource strain: Not on file  . Food insecurity - worry: Not on file  . Food insecurity - inability: Not on file  . Transportation needs - medical: Not on file  . Transportation needs - non-medical: Not on file  Occupational History  . Not on file  Tobacco Use  . Smoking status: Never Smoker  . Smokeless  tobacco: Never Used  Substance and Sexual Activity  . Alcohol use: No  . Drug use: No  . Sexual activity: No  Other Topics Concern  . Not on file  Social History Narrative  . Not on file      Review of Systems  Constitutional: Negative for chills, diaphoresis and fatigue.  HENT: Negative for ear pain, postnasal drip and sinus pressure.   Eyes: Negative for photophobia, discharge, redness, itching and visual disturbance.  Respiratory: Negative for cough, shortness of breath and wheezing.   Cardiovascular: Negative for chest pain, palpitations and leg swelling.  Gastrointestinal: Negative for constipation, diarrhea and nausea.  Genitourinary: Negative for dysuria and flank pain.  Musculoskeletal: Positive for back pain. Negative for arthralgias, gait problem and neck pain.  Skin: Negative for color change.  Allergic/Immunologic: Negative for environmental allergies and food allergies.  Neurological: Positive for tingling. Negative for dizziness and numbness.  Hematological: Does not bruise/bleed easily.  Psychiatric/Behavioral: Negative for agitation, behavioral problems (depression) and hallucinations.    Vital Signs: BP (!) 145/68   Pulse 64   Resp 16   Ht 5\' 3"  (1.6 m)   Wt 140 lb (63.5 kg)   SpO2 95%   BMI 24.80 kg/m    Physical Exam  Constitutional: She is oriented to person, place, and time. She appears well-developed and well-nourished. No distress.  HENT:  Head: Normocephalic and atraumatic.  Mouth/Throat: Oropharynx is clear and moist. No oropharyngeal exudate.  Eyes: EOM are normal. Pupils are equal, round, and reactive to light.  Neck: Normal range of motion. Neck supple. No JVD present. No tracheal deviation present. No thyromegaly present.  Cardiovascular: Normal rate, regular rhythm and normal heart sounds. Exam reveals no gallop and no friction rub.  No murmur heard. Pulmonary/Chest: Effort normal. No respiratory distress. She has no wheezes. She has no  rales. She exhibits no tenderness.  Abdominal: Soft. Bowel sounds are normal.  Musculoskeletal: Normal range of motion.  Lymphadenopathy:    She has no cervical adenopathy.  Neurological: She is alert and oriented to person, place, and time. No cranial nerve deficit.  Skin: Skin is warm and dry. She is not diaphoretic.  Psychiatric: She has a normal mood and affect. Her behavior is normal. Judgment and thought content normal.    Assessment/Plan: 1. Essential (primary) hypertension - Monitor BP for now  2. Cardiac arrhythmia, unspecified cardiac arrhythmia type - ECHOCARDIOGRAM COMPLETE; Future - Holter monitor - 48 hour  3. Hypothyroidism, unspecified type - Check TSH/t4  4. Fall, initial encounter - Righ Hip bruise, no ROM deficits at this time. Will monitor  5. Easy bruising  6. Syncope and collapse - ECHOCARDIOGRAM COMPLETE; Future - Holter monitor - 48 hour  7. Palpitations - Await echo results. Other orders - albuterol (  PROVENTIL HFA;VENTOLIN HFA) 108 (90 Base) MCG/ACT inhaler; Inhale 2 puffs into the lungs every 6 (six) hours as needed for wheezing or shortness of breath. - montelukast (SINGULAIR) 10 MG tablet; Take 10 mg by mouth daily. - aspirin 81 MG tablet; Take 81 mg by mouth daily. - simvastatin (ZOCOR) 20 MG tablet; Take 20 mg by mouth daily with supper. - ipratropium-albuterol (DUONEB) 0.5-2.5 (3) MG/3ML SOLN; Take 3 mLs by nebulization daily. - olmesartan (BENICAR) 40 MG tablet; Take 40 mg by mouth daily. - metoprolol tartrate (LOPRESSOR) 100 MG tablet; Take 100 mg by mouth 2 (two) times daily. Take 1.5 tablets twice daily. - OXYGEN; Inhale 2 L into the lungs every evening. Sleep with nasal cannula w/ O2 at 2lpm. - fluticasone (FLONASE) 50 MCG/ACT nasal spray; Place 1 spray into both nostrils daily. Use 1-2 sprays daily. - fluticasone (FLOVENT HFA) 110 MCG/ACT inhaler; Inhale 2 puffs into the lungs 2 (two) times daily.  General Counseling: [place] verbalizes  understanding of the findings of todays visit and agrees with plan of treatment. I have discussed any further diagnostic evaluation that may be needed or ordered today. We also reviewed her medications today. she has been encouraged to call the office with any questions or concerns that should arise related to todays visit.  Time spent:30 Minutes  Dr Lavera Guise Internal medicine

## 2017-05-29 ENCOUNTER — Other Ambulatory Visit: Payer: Self-pay | Admitting: Nurse Practitioner

## 2017-06-02 ENCOUNTER — Other Ambulatory Visit: Payer: Self-pay

## 2017-06-09 ENCOUNTER — Ambulatory Visit: Payer: Medicare Other

## 2017-06-09 DIAGNOSIS — I499 Cardiac arrhythmia, unspecified: Secondary | ICD-10-CM | POA: Diagnosis not present

## 2017-06-09 DIAGNOSIS — R55 Syncope and collapse: Secondary | ICD-10-CM | POA: Diagnosis not present

## 2017-07-03 ENCOUNTER — Encounter: Payer: Self-pay | Admitting: Nurse Practitioner

## 2017-07-03 ENCOUNTER — Ambulatory Visit (INDEPENDENT_AMBULATORY_CARE_PROVIDER_SITE_OTHER): Payer: Medicare Other | Admitting: Nurse Practitioner

## 2017-07-03 VITALS — BP 154/76 | HR 69 | Resp 16 | Ht 62.0 in | Wt 140.6 lb

## 2017-07-03 DIAGNOSIS — J4520 Mild intermittent asthma, uncomplicated: Secondary | ICD-10-CM

## 2017-07-03 DIAGNOSIS — M810 Age-related osteoporosis without current pathological fracture: Secondary | ICD-10-CM | POA: Insufficient documentation

## 2017-07-03 DIAGNOSIS — E785 Hyperlipidemia, unspecified: Secondary | ICD-10-CM | POA: Diagnosis not present

## 2017-07-03 DIAGNOSIS — E039 Hypothyroidism, unspecified: Secondary | ICD-10-CM

## 2017-07-03 DIAGNOSIS — I10 Essential (primary) hypertension: Secondary | ICD-10-CM | POA: Diagnosis not present

## 2017-07-03 DIAGNOSIS — I499 Cardiac arrhythmia, unspecified: Secondary | ICD-10-CM | POA: Diagnosis not present

## 2017-07-03 NOTE — Progress Notes (Signed)
[facility] [place], [place] [address]  Internal MEDICINE  Office Visit Note  Patient Name: Ashley Weiss  [id number]  [id number]  Date of Service: 07/23/2017  Chief Complaint  Patient presents with  . holter results    3 month follow up  . Hypertension    The patient is here for routine follow up visit. She wore a holter monitor for a few days and is here to discuss results. She had been having some increased episodes of palpitations and shortness of breath. Today, her blood pressure is elevated. Breathing is doing well.   Hypertension  This is a chronic (has been elevated ) problem. The current episode started 1 to 4 weeks ago. The problem has been waxing and waning since onset. The problem is controlled. Associated symptoms include palpitations. Pertinent negatives include no chest pain, neck pain or shortness of breath. Agents associated with hypertension include thyroid hormones. Risk factors for coronary artery disease include post-menopausal state and dyslipidemia. Past treatments include beta blockers. The current treatment provides mild improvement. There are no compliance problems.  Hypertensive end-organ damage includes PVD. Identifiable causes of hypertension include a thyroid problem.    Pt is here for routine follow up.    Current Medication: Outpatient Encounter Medications as of 07/03/2017  Medication Sig  . aspirin 81 MG tablet Take 81 mg by mouth daily.  . digoxin (LANOXIN) 0.25 MG tablet Take 1 tablet (0.25 mg total) by mouth daily. Take estra 0.5 tab on Tuesday and Friday.  . fluticasone (FLOVENT HFA) 110 MCG/ACT inhaler Inhale 2 puffs into the lungs 2 (two) times daily.  Marland Kitchen ipratropium-albuterol (DUONEB) 0.5-2.5 (3) MG/3ML SOLN Take 3 mLs by nebulization daily.  Marland Kitchen levothyroxine (SYNTHROID, LEVOTHROID) 25 MCG tablet TAKE 1 DAILY BY MOUTH IN THE MORNING ON AN EMPTY STOMACH  . metoprolol tartrate (LOPRESSOR) 100 MG tablet Take 100 mg by  mouth 2 (two) times daily. Take 1.5 tablets twice daily.  . montelukast (SINGULAIR) 10 MG tablet Take 10 mg by mouth daily.  Marland Kitchen olmesartan (BENICAR) 40 MG tablet Take 40 mg by mouth daily.  . OXYGEN Inhale 2 L into the lungs every evening. Sleep with nasal cannula w/ O2 at 2lpm.  . [DISCONTINUED] albuterol (PROVENTIL HFA;VENTOLIN HFA) 108 (90 Base) MCG/ACT inhaler Inhale 2 puffs into the lungs every 6 (six) hours as needed for wheezing or shortness of breath.  . [DISCONTINUED] fluticasone (FLONASE) 50 MCG/ACT nasal spray Place 1 spray into both nostrils daily. Use 1-2 sprays daily.  . [DISCONTINUED] sertraline (ZOLOFT) 100 MG tablet Take 1 tablet (100 mg total) by mouth daily.  . [DISCONTINUED] simvastatin (ZOCOR) 20 MG tablet Take 20 mg by mouth daily with supper.   No facility-administered encounter medications on file as of 07/03/2017.     Surgical History: Past Surgical History:  Procedure Laterality Date  . BREAST CYST ASPIRATION Left 20+ yrs ago  . CATARACT EXTRACTION Bilateral 2005,2009  . cyst removal-right shoulder  1972  . otosclerosis Left 1988   hardening of bone, other 1989 redone  . THYROIDECTOMY  1970   nodule and 1/2 bridge removal  . WRIST SURGERY Left 11/29/2009    Medical History: Past Medical History:  Diagnosis Date  . Arthritis   . Asthma   . Cancer ([place])    skin  . Depression   . DVT (deep venous thrombosis) ([place])   . Hyperlipidemia   . Hypertension   . Phlebitis     Family History: Family History  Problem Relation Age of Onset  . Osteoarthritis Mother   . Depression Father   . Breast cancer Neg Hx     Social History   Socioeconomic History  . Marital status: Widowed    Spouse name: Not on file  . Number of children: Not on file  . Years of education: Not on file  . Highest education level: Not on file  Occupational History  . Not on file  Social Needs  . Financial resource strain: Not on file  . Food insecurity:    Worry: Not on file     Inability: Not on file  . Transportation needs:    Medical: Not on file    Non-medical: Not on file  Tobacco Use  . Smoking status: Never Smoker  . Smokeless tobacco: Never Used  Substance and Sexual Activity  . Alcohol use: No  . Drug use: No  . Sexual activity: Never  Lifestyle  . Physical activity:    Days per week: Not on file    Minutes per session: Not on file  . Stress: Not on file  Relationships  . Social connections:    Talks on phone: Not on file    Gets together: Not on file    Attends religious service: Not on file    Active member of club or organization: Not on file    Attends meetings of clubs or organizations: Not on file    Relationship status: Not on file  . Intimate partner violence:    Fear of current or ex partner: Not on file    Emotionally abused: Not on file    Physically abused: Not on file    Forced sexual activity: Not on file  Other Topics Concern  . Not on file  Social History Narrative  . Not on file      Review of Systems  Constitutional: Positive for fatigue. Negative for chills and diaphoresis.  HENT: Negative for congestion, ear pain, postnasal drip and sinus pressure.   Eyes: Negative.  Negative for photophobia, discharge, redness, itching and visual disturbance.  Respiratory: Negative for cough, shortness of breath and wheezing.   Cardiovascular: Positive for palpitations. Negative for chest pain and leg swelling.  Gastrointestinal: Negative for constipation, diarrhea, nausea and vomiting.  Endocrine: Negative for cold intolerance, heat intolerance, polydipsia, polyphagia and polyuria.  Genitourinary: Negative for dysuria and flank pain.  Musculoskeletal: Positive for back pain. Negative for arthralgias, gait problem and neck pain.  Skin: Negative for color change.  Allergic/Immunologic: Positive for environmental allergies. Negative for food allergies.  Neurological: Positive for dizziness. Negative for numbness.   Hematological: Does not bruise/bleed easily.  Psychiatric/Behavioral: Negative for agitation, behavioral problems (depression) and hallucinations. The patient is nervous/anxious.     Today's Vitals   07/03/17 1119  BP: (!) 154/76  Pulse: 69  Resp: 16  SpO2: 90%  Weight: 140 lb 9.6 oz (63.8 kg)  Height: 5\' 2"  (1.575 m)    Physical Exam  Constitutional: She is oriented to person, place, and time. She appears well-developed and well-nourished. No distress.  HENT:  Head: Normocephalic and atraumatic.  Mouth/Throat: Oropharynx is clear and moist. No oropharyngeal exudate.  Eyes: Pupils are equal, round, and reactive to light. EOM are normal.  Neck: Normal range of motion. Neck supple. No JVD present. No tracheal deviation present. No thyromegaly present.  Cardiovascular: Normal rate, regular rhythm and normal heart sounds. Exam reveals no gallop and no friction rub.  No murmur heard. Pulmonary/Chest: Effort normal.  No respiratory distress. She has no wheezes. She has no rales. She exhibits no tenderness.  Abdominal: Soft. Bowel sounds are normal. There is no tenderness.  Musculoskeletal: Normal range of motion.  Lymphadenopathy:    She has no cervical adenopathy.  Neurological: She is alert and oriented to person, place, and time. No cranial nerve deficit.  Skin: Skin is warm and dry. She is not diaphoretic.  Psychiatric: She has a normal mood and affect. Her behavior is normal. Judgment and thought content normal.  Nursing note and vitals reviewed.  Assessment/Plan: 1. Cardiac arrhythmia, unspecified cardiac arrhythmia type Stable. Benign appearing echocardiogram. Will continue to monitor closely  2. Essential (primary) hypertension Generally stable. Continue bp medication as prescribed.   3. Mild intermittent asthma without complication Doing well. Continue inhalers and respiratory medications as prescribed   4. Hypothyroidism, unspecified type Stable. Continue  levothyroxine a prescribed.  5. Hyperlipidemia, unspecified hyperlipidemia type continue statin as prescribed.  General Counseling: [place] verbalizes understanding of the findings of todays visit and agrees with plan of treatment. I have discussed any further diagnostic evaluation that may be needed or ordered today. We also reviewed her medications today. she has been encouraged to call the office with any questions or concerns that should arise related to todays visit.  This patient was seen by Leretha Pol, FNP- C in Collaboration with Dr Lavera Guise as a part of collaborative care agreement    No orders of the defined types were placed in this encounter.     Time spent: 79 Minutes    Dr Lavera Guise Internal medicine

## 2017-07-08 ENCOUNTER — Other Ambulatory Visit: Payer: Self-pay | Admitting: Internal Medicine

## 2017-07-08 ENCOUNTER — Other Ambulatory Visit: Payer: Self-pay

## 2017-07-08 MED ORDER — SERTRALINE HCL 100 MG PO TABS: 100.0000 mg | ORAL_TABLET | Freq: Every day | ORAL | 2 refills | Status: DC

## 2017-07-08 MED ORDER — ALBUTEROL SULFATE HFA 108 (90 BASE) MCG/ACT IN AERS: 2.0000 | INHALATION_SPRAY | Freq: Four times a day (QID) | RESPIRATORY_TRACT | 5 refills | Status: DC | PRN

## 2017-07-09 DIAGNOSIS — L578 Other skin changes due to chronic exposure to nonionizing radiation: Secondary | ICD-10-CM | POA: Diagnosis not present

## 2017-07-09 DIAGNOSIS — D1800 Hemangioma unspecified site: Secondary | ICD-10-CM | POA: Diagnosis not present

## 2017-07-09 DIAGNOSIS — L812 Freckles: Secondary | ICD-10-CM | POA: Diagnosis not present

## 2017-07-09 DIAGNOSIS — L570 Actinic keratosis: Secondary | ICD-10-CM | POA: Diagnosis not present

## 2017-07-09 DIAGNOSIS — D692 Other nonthrombocytopenic purpura: Secondary | ICD-10-CM | POA: Diagnosis not present

## 2017-07-09 DIAGNOSIS — D225 Melanocytic nevi of trunk: Secondary | ICD-10-CM | POA: Diagnosis not present

## 2017-07-09 DIAGNOSIS — I8393 Asymptomatic varicose veins of bilateral lower extremities: Secondary | ICD-10-CM | POA: Diagnosis not present

## 2017-07-09 DIAGNOSIS — Z1283 Encounter for screening for malignant neoplasm of skin: Secondary | ICD-10-CM | POA: Diagnosis not present

## 2017-07-09 DIAGNOSIS — L821 Other seborrheic keratosis: Secondary | ICD-10-CM | POA: Diagnosis not present

## 2017-07-09 DIAGNOSIS — L720 Epidermal cyst: Secondary | ICD-10-CM | POA: Diagnosis not present

## 2017-07-09 DIAGNOSIS — Z85828 Personal history of other malignant neoplasm of skin: Secondary | ICD-10-CM | POA: Diagnosis not present

## 2017-07-09 DIAGNOSIS — D2230 Melanocytic nevi of unspecified part of face: Secondary | ICD-10-CM | POA: Diagnosis not present

## 2017-07-21 ENCOUNTER — Other Ambulatory Visit: Payer: Self-pay

## 2017-07-21 MED ORDER — FLUTICASONE PROPIONATE 50 MCG/ACT NA SUSP: 1.0000 | Freq: Every day | NASAL | 3 refills | Status: DC

## 2017-07-23 DIAGNOSIS — E785 Hyperlipidemia, unspecified: Secondary | ICD-10-CM | POA: Insufficient documentation

## 2017-08-06 ENCOUNTER — Encounter: Payer: Self-pay | Admitting: Internal Medicine

## 2017-08-06 ENCOUNTER — Ambulatory Visit (INDEPENDENT_AMBULATORY_CARE_PROVIDER_SITE_OTHER): Payer: Medicare Other | Admitting: Internal Medicine

## 2017-08-06 VITALS — BP 130/70 | HR 66 | Resp 16 | Ht 62.5 in | Wt 139.4 lb

## 2017-08-06 DIAGNOSIS — J45909 Unspecified asthma, uncomplicated: Secondary | ICD-10-CM

## 2017-08-06 DIAGNOSIS — R0602 Shortness of breath: Secondary | ICD-10-CM

## 2017-08-06 DIAGNOSIS — R0902 Hypoxemia: Secondary | ICD-10-CM

## 2017-08-06 DIAGNOSIS — I10 Essential (primary) hypertension: Secondary | ICD-10-CM

## 2017-08-06 MED ORDER — LEVOFLOXACIN 500 MG PO TABS: 500.0000 mg | ORAL_TABLET | Freq: Every day | ORAL | 0 refills | Status: DC

## 2017-08-06 NOTE — Progress Notes (Signed)
[facility], [place] [address]  Internal MEDICINE  Office Visit Note  Patient Name: Ashley Weiss  [id number]  [id number]  Date of Service: 08/06/2017  Chief Complaint  Patient presents with  . Follow-up    face to face visit for O2    HPI  Pt is here for routine follow up. Pt has hx of asthma and nocturnal hypoxia, pt was started on O2 last year when she was hospitalized. Overall she has been well. occasional sob with walking. Pt will like to have prescription of Levaquin on hand to avoid future admissions to hospital, daughter is in the room with her  Current Medication: Outpatient Encounter Medications as of 08/06/2017  Medication Sig  . albuterol (PROVENTIL HFA;VENTOLIN HFA) 108 (90 Base) MCG/ACT inhaler Inhale 2 puffs into the lungs every 6 (six) hours as needed for wheezing or shortness of breath.  Marland Kitchen aspirin 81 MG tablet Take 81 mg by mouth daily.  . digoxin (LANOXIN) 0.25 MG tablet Take 1 tablet (0.25 mg total) by mouth daily. Take estra 0.5 tab on Tuesday and Friday.  . fluticasone (FLONASE) 50 MCG/ACT nasal spray Place 1 spray into both nostrils daily. Use 1-2 sprays daily.  . fluticasone (FLOVENT HFA) 110 MCG/ACT inhaler Inhale 2 puffs into the lungs 2 (two) times daily.  Marland Kitchen ipratropium-albuterol (DUONEB) 0.5-2.5 (3) MG/3ML SOLN Take 3 mLs by nebulization daily.  Marland Kitchen levofloxacin (LEVAQUIN) 500 MG tablet Take 1 tablet (500 mg total) by mouth daily.  Marland Kitchen levothyroxine (SYNTHROID, LEVOTHROID) 25 MCG tablet TAKE 1 DAILY BY MOUTH IN THE MORNING ON AN EMPTY STOMACH  . metoprolol tartrate (LOPRESSOR) 100 MG tablet Take 100 mg by mouth 2 (two) times daily. Take 1.5 tablets twice daily.  . montelukast (SINGULAIR) 10 MG tablet Take 10 mg by mouth daily.  Marland Kitchen olmesartan (BENICAR) 40 MG tablet Take 40 mg by mouth daily.  . OXYGEN Inhale 2 L into the lungs every evening. Sleep with nasal cannula w/ O2 at 2lpm.  . sertraline (ZOLOFT) 100 MG tablet Take 1  tablet (100 mg total) by mouth daily.  . simvastatin (ZOCOR) 20 MG tablet TAKE ONE TABLET BY MOUTH AT BEDTIME   No facility-administered encounter medications on file as of 08/06/2017.    Surgical History: Past Surgical History:  Procedure Laterality Date  . BREAST CYST ASPIRATION Left 20+ yrs ago  . CATARACT EXTRACTION Bilateral 2005,2009  . cyst removal-right shoulder  1972  . otosclerosis Left 1988   hardening of bone, other 1989 redone  . THYROIDECTOMY  1970   nodule and 1/2 bridge removal  . WRIST SURGERY Left 11/29/2009   Medical History: Past Medical History:  Diagnosis Date  . Arthritis   . Asthma   . Cancer ([place])    skin  . Depression   . DVT (deep venous thrombosis) ([place])   . Hyperlipidemia   . Hypertension   . Phlebitis     Family History: Family History  Problem Relation Age of Onset  . Osteoarthritis Mother   . Depression Father   . Breast cancer Neg Hx     Social History   Socioeconomic History  . Marital status: Widowed    Spouse name: Not on file  . Number of children: Not on file  . Years of education: Not on file  . Highest education level: Not on file  Occupational History  . Not on file  Social Needs  . Financial resource strain: Not on file  . Food  insecurity:    Worry: Not on file    Inability: Not on file  . Transportation needs:    Medical: Not on file    Non-medical: Not on file  Tobacco Use  . Smoking status: Never Smoker  . Smokeless tobacco: Never Used  Substance and Sexual Activity  . Alcohol use: No  . Drug use: No  . Sexual activity: Never  Lifestyle  . Physical activity:    Days per week: Not on file    Minutes per session: Not on file  . Stress: Not on file  Relationships  . Social connections:    Talks on phone: Not on file    Gets together: Not on file    Attends religious service: Not on file    Active member of club or organization: Not on file    Attends meetings of clubs or organizations: Not on file     Relationship status: Not on file  . Intimate partner violence:    Fear of current or ex partner: Not on file    Emotionally abused: Not on file    Physically abused: Not on file    Forced sexual activity: Not on file  Other Topics Concern  . Not on file  Social History Narrative  . Not on file   Review of Systems  Constitutional: Negative for chills, fatigue and unexpected weight change.  HENT: Positive for postnasal drip. Negative for congestion, rhinorrhea, sneezing and sore throat.   Eyes: Negative for redness.  Respiratory: Positive for shortness of breath. Negative for cough and chest tightness.   Cardiovascular: Negative for chest pain and palpitations.  Gastrointestinal: Negative for abdominal pain, constipation, diarrhea, nausea and vomiting.  Genitourinary: Negative for dysuria and frequency.  Musculoskeletal: Negative for arthralgias, back pain, joint swelling and neck pain.  Skin: Negative for rash.  Neurological: Negative.  Negative for tremors and numbness.  Hematological: Negative for adenopathy. Does not bruise/bleed easily.  Psychiatric/Behavioral: Negative for behavioral problems (Depression), sleep disturbance and suicidal ideas. The patient is not nervous/anxious.    Vital Signs: BP 130/70 (BP Location: Left Arm, Patient Position: Sitting, Cuff Size: Normal)   Pulse 66   Resp 16   Ht 5' 2.5" (1.588 m)   Wt 139 lb 6.4 oz (63.2 kg)   SpO2 (!) 88%   BMI 25.09 kg/m    Physical Exam  Constitutional: She is oriented to person, place, and time. She appears well-developed and well-nourished. No distress.  HENT:  Head: Normocephalic and atraumatic.  Mouth/Throat: Oropharynx is clear and moist. No oropharyngeal exudate.  Eyes: Pupils are equal, round, and reactive to light. EOM are normal.  Neck: Normal range of motion. Neck supple. No JVD present. No tracheal deviation present. No thyromegaly present.  Cardiovascular: Normal rate, regular rhythm and normal heart  sounds. Exam reveals no gallop and no friction rub.  No murmur heard. Pulmonary/Chest: Effort normal. No respiratory distress. She has no wheezes. She has no rales. She exhibits no tenderness.  Abdominal: Soft. Bowel sounds are normal.  Musculoskeletal: Normal range of motion.  Lymphadenopathy:    She has no cervical adenopathy.  Neurological: She is alert and oriented to person, place, and time. No cranial nerve deficit.  Skin: Skin is warm and dry. She is not diaphoretic.  Psychiatric: She has a normal mood and affect. Her behavior is normal. Judgment and thought content normal.   Assessment/Plan: 1. Shortness of breath - Improving - levofloxacin (LEVAQUIN) 500 MG tablet; Take 1 tablet (500 mg  total) by mouth daily.  Dispense: 10 tablet; Refill: 0  2. Nocturnal hypoxemia due to asthma - Pulse oximetry, overnight; Future  3. Essential (primary) hypertension - Controlled   General Counseling: Braedyn verbalizes understanding of the findings of todays visit and agrees with plan of treatment. I have discussed any further diagnostic evaluation that may be needed or ordered today. We also reviewed her medications today. she has been encouraged to call the office with any questions or concerns that should arise related to todays visit.   Orders Placed This Encounter  Procedures  . Pulse oximetry, overnight    Meds ordered this encounter  Medications  . levofloxacin (LEVAQUIN) 500 MG tablet    Sig: Take 1 tablet (500 mg total) by mouth daily.    Dispense:  10 tablet    Refill:  0    Time spent:25  Minutes   Dr Lavera Guise Internal medicine

## 2017-08-07 ENCOUNTER — Telehealth: Payer: Self-pay | Admitting: Internal Medicine

## 2017-08-07 NOTE — Telephone Encounter (Signed)
Faxed POX order to [facility]. [facility]

## 2017-08-13 ENCOUNTER — Other Ambulatory Visit: Payer: Self-pay

## 2017-08-13 DIAGNOSIS — H6123 Impacted cerumen, bilateral: Secondary | ICD-10-CM | POA: Diagnosis not present

## 2017-08-13 DIAGNOSIS — H903 Sensorineural hearing loss, bilateral: Secondary | ICD-10-CM | POA: Diagnosis not present

## 2017-08-13 DIAGNOSIS — H6062 Unspecified chronic otitis externa, left ear: Secondary | ICD-10-CM | POA: Diagnosis not present

## 2017-08-19 DIAGNOSIS — J45909 Unspecified asthma, uncomplicated: Secondary | ICD-10-CM | POA: Diagnosis not present

## 2017-09-12 DIAGNOSIS — M79601 Pain in right arm: Secondary | ICD-10-CM | POA: Diagnosis not present

## 2017-09-12 DIAGNOSIS — Z6823 Body mass index (BMI) 23.0-23.9, adult: Secondary | ICD-10-CM | POA: Diagnosis not present

## 2017-09-29 ENCOUNTER — Other Ambulatory Visit: Payer: Self-pay | Admitting: Internal Medicine

## 2017-09-30 ENCOUNTER — Other Ambulatory Visit: Payer: Self-pay | Admitting: Internal Medicine

## 2017-09-30 DIAGNOSIS — M810 Age-related osteoporosis without current pathological fracture: Secondary | ICD-10-CM | POA: Diagnosis not present

## 2017-09-30 DIAGNOSIS — M25531 Pain in right wrist: Secondary | ICD-10-CM | POA: Diagnosis not present

## 2017-09-30 DIAGNOSIS — M25512 Pain in left shoulder: Secondary | ICD-10-CM | POA: Diagnosis not present

## 2017-09-30 MED ORDER — DIGOXIN 250 MCG PO TABS: 0.2500 mg | ORAL_TABLET | Freq: Every day | ORAL | 0 refills | Status: DC

## 2017-10-09 ENCOUNTER — Ambulatory Visit (INDEPENDENT_AMBULATORY_CARE_PROVIDER_SITE_OTHER): Payer: Medicare Other | Admitting: Nurse Practitioner

## 2017-10-09 ENCOUNTER — Encounter: Payer: Self-pay | Admitting: Nurse Practitioner

## 2017-10-09 VITALS — BP 148/74 | HR 70 | Resp 16 | Ht 63.0 in | Wt 137.0 lb

## 2017-10-09 DIAGNOSIS — I10 Essential (primary) hypertension: Secondary | ICD-10-CM | POA: Diagnosis not present

## 2017-10-09 DIAGNOSIS — I499 Cardiac arrhythmia, unspecified: Secondary | ICD-10-CM | POA: Diagnosis not present

## 2017-10-09 DIAGNOSIS — J45909 Unspecified asthma, uncomplicated: Secondary | ICD-10-CM | POA: Diagnosis not present

## 2017-10-09 DIAGNOSIS — R0902 Hypoxemia: Secondary | ICD-10-CM | POA: Diagnosis not present

## 2017-10-09 DIAGNOSIS — J4520 Mild intermittent asthma, uncomplicated: Secondary | ICD-10-CM

## 2017-10-09 DIAGNOSIS — E039 Hypothyroidism, unspecified: Secondary | ICD-10-CM | POA: Diagnosis not present

## 2017-10-09 NOTE — Progress Notes (Signed)
[facility] [place], [place] [address]  Internal MEDICINE  Office Visit Note  Patient Name: Ashley Weiss  [id number]  [id number]  Date of Service: 10/29/2017   Pt is here for routine follow up.   Chief Complaint  Patient presents with  . Hand Injury    The patient injured ligament in her right hand a few weeks ago while doing yard work. Has seen orthopedics. No fracture. Currently in wrist brace. She also fell at home, hurting on the left shoulder. This has also been evaluated per orthopedics. Again, no fracture or bony injury. Taking tylenol as needed. Generally takes it once. Sometimes needs this two or three times per day.       Current Medication: Outpatient Encounter Medications as of 10/09/2017  Medication Sig  . albuterol (PROVENTIL HFA;VENTOLIN HFA) 108 (90 Base) MCG/ACT inhaler Inhale 2 puffs into the lungs every 6 (six) hours as needed for wheezing or shortness of breath.  Marland Kitchen aspirin 81 MG tablet Take 81 mg by mouth daily.  Marland Kitchen denosumab (PROLIA) 60 MG/ML SOSY injection Inject 60 mg into the skin every 6 (six) months.  . digoxin (LANOXIN) 0.25 MG tablet Take 1 tablet (0.25 mg total) by mouth daily. Take estra 0.5 tab on Tuesday and Friday.  . fluticasone (FLOVENT HFA) 110 MCG/ACT inhaler Inhale 2 puffs into the lungs 2 (two) times daily.  Marland Kitchen ipratropium-albuterol (DUONEB) 0.5-2.5 (3) MG/3ML SOLN Take 3 mLs by nebulization daily.  Marland Kitchen levothyroxine (SYNTHROID, LEVOTHROID) 25 MCG tablet TAKE 1 DAILY BY MOUTH IN THE MORNING ON AN EMPTY STOMACH  . metoprolol tartrate (LOPRESSOR) 100 MG tablet Take 100 mg by mouth 2 (two) times daily. Take 1.5 tablets twice daily.  . montelukast (SINGULAIR) 10 MG tablet TAKE 1 TABLET BY MOUTH EVERY DAY FOR COUGH  . olmesartan (BENICAR) 40 MG tablet Take 40 mg by mouth daily.  . OXYGEN Inhale 2 L into the lungs every evening. Sleep with nasal cannula w/ O2 at 2lpm.  . sertraline (ZOLOFT) 100 MG tablet Take 1 tablet (100  mg total) by mouth daily.  . simvastatin (ZOCOR) 20 MG tablet TAKE ONE TABLET BY MOUTH AT BEDTIME  . [DISCONTINUED] fluticasone (FLONASE) 50 MCG/ACT nasal spray Place 1 spray into both nostrils daily. Use 1-2 sprays daily.  . [DISCONTINUED] levofloxacin (LEVAQUIN) 500 MG tablet Take 1 tablet (500 mg total) by mouth daily.   No facility-administered encounter medications on file as of 10/09/2017.     Surgical History: Past Surgical History:  Procedure Laterality Date  . BREAST CYST ASPIRATION Left 20+ yrs ago  . CATARACT EXTRACTION Bilateral 2005,2009  . cyst removal-right shoulder  1972  . otosclerosis Left 1988   hardening of bone, other 1989 redone  . THYROIDECTOMY  1970   nodule and 1/2 bridge removal  . WRIST SURGERY Left 11/29/2009    Medical History: Past Medical History:  Diagnosis Date  . Arthritis   . Asthma   . Cancer ([place])    skin  . Depression   . DVT (deep venous thrombosis) ([place])   . Hyperlipidemia   . Hypertension   . Phlebitis     Family History: Family History  Problem Relation Age of Onset  . Osteoarthritis Mother   . Depression Father   . Breast cancer Neg Hx     Social History   Socioeconomic History  . Marital status: Widowed    Spouse name: Not on file  . Number of children: Not on file  .  Years of education: Not on file  . Highest education level: Not on file  Occupational History  . Not on file  Social Needs  . Financial resource strain: Not on file  . Food insecurity:    Worry: Not on file    Inability: Not on file  . Transportation needs:    Medical: Not on file    Non-medical: Not on file  Tobacco Use  . Smoking status: Never Smoker  . Smokeless tobacco: Never Used  Substance and Sexual Activity  . Alcohol use: No  . Drug use: No  . Sexual activity: Never  Lifestyle  . Physical activity:    Days per week: Not on file    Minutes per session: Not on file  . Stress: Not on file  Relationships  . Social connections:     Talks on phone: Not on file    Gets together: Not on file    Attends religious service: Not on file    Active member of club or organization: Not on file    Attends meetings of clubs or organizations: Not on file    Relationship status: Not on file  . Intimate partner violence:    Fear of current or ex partner: Not on file    Emotionally abused: Not on file    Physically abused: Not on file    Forced sexual activity: Not on file  Other Topics Concern  . Not on file  Social History Narrative  . Not on file      Review of Systems  Constitutional: Positive for fatigue. Negative for chills and diaphoresis.  HENT: Negative for congestion, ear pain, postnasal drip and sinus pressure.   Eyes: Negative.  Negative for photophobia, discharge, redness, itching and visual disturbance.  Respiratory: Negative for cough, shortness of breath and wheezing.        Has some intermittent wheezing and shortness of breath. Uses inhaler and neb treatments which are helpful .  Cardiovascular: Negative for chest pain, palpitations and leg swelling.  Gastrointestinal: Negative for constipation, diarrhea, nausea and vomiting.  Endocrine: Negative for cold intolerance, heat intolerance, polydipsia, polyphagia and polyuria.  Genitourinary: Negative for dysuria and flank pain.  Musculoskeletal: Positive for arthralgias and back pain. Negative for gait problem and neck pain.       Right hand/wrist pain. Improving.   Skin: Negative for color change and rash.  Allergic/Immunologic: Positive for environmental allergies. Negative for food allergies.  Neurological: Positive for dizziness. Negative for weakness and headaches.  Hematological: Negative for adenopathy. Does not bruise/bleed easily.  Psychiatric/Behavioral: Negative for agitation, behavioral problems (depression) and hallucinations. The patient is nervous/anxious.     Today's Vitals   10/09/17 1019  BP: (!) 148/74  Pulse: 70  Resp: 16  SpO2: 96%   Weight: 137 lb (62.1 kg)  Height: 5\' 3"  (1.6 m)    Physical Exam  Constitutional: She is oriented to person, place, and time. She appears well-developed and well-nourished. No distress.  HENT:  Head: Normocephalic and atraumatic.  Mouth/Throat: Oropharynx is clear and moist. No oropharyngeal exudate.  Eyes: Pupils are equal, round, and reactive to light. EOM are normal.  Neck: Normal range of motion. Neck supple. No JVD present. No tracheal deviation present. No thyromegaly present.  Cardiovascular: Normal rate and regular rhythm. Exam reveals no gallop and no friction rub.  Murmur heard. Pulmonary/Chest: Effort normal and breath sounds normal. No respiratory distress. She has no wheezes. She has no rales. She exhibits no tenderness.  Abdominal: Soft. Bowel sounds are normal. There is no tenderness.  Musculoskeletal: Normal range of motion.  Right wrist and hand tenderness. Currently in immobilizing brace. Moderate kyphosis present.   Lymphadenopathy:    She has no cervical adenopathy.  Neurological: She is alert and oriented to person, place, and time. No cranial nerve deficit.  Skin: Skin is warm and dry. She is not diaphoretic.  Psychiatric: She has a normal mood and affect. Her behavior is normal. Judgment and thought content normal.  Nursing note and vitals reviewed.  Assessment/Plan: 1. Essential (primary) hypertension Stable. Continue bp medication as prescribed.  2. Cardiac arrhythmia, unspecified cardiac arrhythmia type Stable. Continue digoxin at current dose .  3. Mild intermittent asthma without complication Neb treatments as needed and as prescribed. Carry rescue inhaler and use as needed.   4. Nocturnal hypoxemia due to asthma Continue using oxygen at night as prescribed   5. Hypothyroidism, unspecified type Check thyroid panel prior to next visit and adjust levothyroxine as indicated.   General Counseling: Dazia verbalizes understanding of the findings of  todays visit and agrees with plan of treatment. I have discussed any further diagnostic evaluation that may be needed or ordered today. We also reviewed her medications today. she has been encouraged to call the office with any questions or concerns that should arise related to todays visit.    Counseling:  This patient was seen by Leretha Pol, FNP- C in Collaboration with Dr Lavera Guise as a part of collaborative care agreement    Time spent: 57 Minutes    Dr Lavera Guise Internal medicine

## 2017-10-14 ENCOUNTER — Other Ambulatory Visit: Payer: Self-pay | Admitting: Nurse Practitioner

## 2017-10-14 MED ORDER — FLUTICASONE PROPIONATE 50 MCG/ACT NA SUSP: 1.0000 | Freq: Every day | NASAL | 3 refills | Status: DC

## 2017-10-16 ENCOUNTER — Ambulatory Visit: Payer: Self-pay | Admitting: Internal Medicine

## 2017-10-29 ENCOUNTER — Encounter: Payer: Self-pay | Admitting: Nurse Practitioner

## 2017-10-29 DIAGNOSIS — J45909 Unspecified asthma, uncomplicated: Secondary | ICD-10-CM | POA: Insufficient documentation

## 2017-10-29 DIAGNOSIS — R0902 Hypoxemia: Secondary | ICD-10-CM

## 2017-10-30 ENCOUNTER — Ambulatory Visit (INDEPENDENT_AMBULATORY_CARE_PROVIDER_SITE_OTHER): Payer: Medicare Other | Admitting: Internal Medicine

## 2017-10-30 ENCOUNTER — Encounter: Payer: Self-pay | Admitting: Internal Medicine

## 2017-10-30 VITALS — BP 158/78 | HR 87 | Resp 16 | Ht 62.0 in | Wt 136.0 lb

## 2017-10-30 DIAGNOSIS — R0602 Shortness of breath: Secondary | ICD-10-CM | POA: Diagnosis not present

## 2017-10-30 DIAGNOSIS — J4520 Mild intermittent asthma, uncomplicated: Secondary | ICD-10-CM

## 2017-10-30 NOTE — Progress Notes (Signed)
[facility] [place], [place] [address]  Pulmonary Sleep Medicine   Office Visit Note  Patient Name: Ashley Weiss DOB: [birth year] MRN [id number]  Date of Service: 10/30/2017  Complaints/HPI: Patient is here for follow-up of her chronic obstructive asthma.  She is actually done well with her current medication regimen.  She has not had any flareups she has not required any admission to the hospital.  States that the current inhalers are controlling her symptoms.  She does experience some shortness of breath when she exerts herself especially if she is going uphill.  Denies currently having any chest pain or palpitations.  No fevers or chills.  ROS  General: (-) fever, (-) chills, (-) night sweats, (-) weakness Skin: (-) rashes, (-) itching,. Eyes: (-) visual changes, (-) redness, (-) itching. Nose and Sinuses: (-) nasal stuffiness or itchiness, (-) postnasal drip, (-) nosebleeds, (-) sinus trouble. Mouth and Throat: (-) sore throat, (-) hoarseness. Neck: (-) swollen glands, (-) enlarged thyroid, (-) neck pain. Respiratory: + cough, (-) bloody sputum, - shortness of breath, - wheezing. Cardiovascular: - ankle swelling, (-) chest pain. Lymphatic: (-) lymph node enlargement. Neurologic: (-) numbness, (-) tingling. Psychiatric: (-) anxiety, (-) depression   Current Medication: Outpatient Encounter Medications as of 10/30/2017  Medication Sig  . albuterol (PROVENTIL HFA;VENTOLIN HFA) 108 (90 Base) MCG/ACT inhaler Inhale 2 puffs into the lungs every 6 (six) hours as needed for wheezing or shortness of breath.  Marland Kitchen aspirin 81 MG tablet Take 81 mg by mouth daily.  Marland Kitchen denosumab (PROLIA) 60 MG/ML SOSY injection Inject 60 mg into the skin every 6 (six) months.  . digoxin (LANOXIN) 0.25 MG tablet Take 1 tablet (0.25 mg total) by mouth daily. Take estra 0.5 tab on Tuesday and Friday.  . fluticasone (FLONASE) 50 MCG/ACT nasal spray Place 1 spray into both nostrils  daily. Use 1-2 sprays daily.  . fluticasone (FLOVENT HFA) 110 MCG/ACT inhaler Inhale 2 puffs into the lungs 2 (two) times daily.  Marland Kitchen ipratropium-albuterol (DUONEB) 0.5-2.5 (3) MG/3ML SOLN Take 3 mLs by nebulization daily.  Marland Kitchen levofloxacin (LEVAQUIN) 500 MG tablet Take 500 mg by mouth daily.  Marland Kitchen levothyroxine (SYNTHROID, LEVOTHROID) 25 MCG tablet TAKE 1 DAILY BY MOUTH IN THE MORNING ON AN EMPTY STOMACH  . metoprolol tartrate (LOPRESSOR) 100 MG tablet Take 100 mg by mouth 2 (two) times daily. Take 1.5 tablets twice daily.  . montelukast (SINGULAIR) 10 MG tablet TAKE 1 TABLET BY MOUTH EVERY DAY FOR COUGH  . olmesartan (BENICAR) 40 MG tablet Take 40 mg by mouth daily.  . OXYGEN Inhale 2 L into the lungs every evening. Sleep with nasal cannula w/ O2 at 2lpm.  . sertraline (ZOLOFT) 100 MG tablet Take 1 tablet (100 mg total) by mouth daily.  . simvastatin (ZOCOR) 20 MG tablet TAKE ONE TABLET BY MOUTH AT BEDTIME  . mometasone (ELOCON) 0.1 % ointment Apply topically.   No facility-administered encounter medications on file as of 10/30/2017.     Surgical History: Past Surgical History:  Procedure Laterality Date  . BREAST CYST ASPIRATION Left 20+ yrs ago  . CATARACT EXTRACTION Bilateral 2005,2009  . cyst removal-right shoulder  1972  . otosclerosis Left 1988   hardening of bone, other 1989 redone  . THYROIDECTOMY  1970   nodule and 1/2 bridge removal  . WRIST SURGERY Left 11/29/2009    Medical History: Past Medical History:  Diagnosis Date  . Arthritis   . Asthma   . Cancer ([place])  skin  . Depression   . DVT (deep venous thrombosis) ([place])   . Hyperlipidemia   . Hypertension   . Phlebitis     Family History: Family History  Problem Relation Age of Onset  . Osteoarthritis Mother   . Depression Father   . Breast cancer Neg Hx     Social History: Social History   Socioeconomic History  . Marital status: Widowed    Spouse name: Not on file  . Number of children: Not on file  .  Years of education: Not on file  . Highest education level: Not on file  Occupational History  . Not on file  Social Needs  . Financial resource strain: Not on file  . Food insecurity:    Worry: Not on file    Inability: Not on file  . Transportation needs:    Medical: Not on file    Non-medical: Not on file  Tobacco Use  . Smoking status: Never Smoker  . Smokeless tobacco: Never Used  Substance and Sexual Activity  . Alcohol use: No  . Drug use: No  . Sexual activity: Never  Lifestyle  . Physical activity:    Days per week: Not on file    Minutes per session: Not on file  . Stress: Not on file  Relationships  . Social connections:    Talks on phone: Not on file    Gets together: Not on file    Attends religious service: Not on file    Active member of club or organization: Not on file    Attends meetings of clubs or organizations: Not on file    Relationship status: Not on file  . Intimate partner violence:    Fear of current or ex partner: Not on file    Emotionally abused: Not on file    Physically abused: Not on file    Forced sexual activity: Not on file  Other Topics Concern  . Not on file  Social History Narrative  . Not on file    Vital Signs: Blood pressure (!) 158/78, pulse 87, resp. rate 16, height 5\' 2"  (1.575 m), weight 136 lb (61.7 kg), SpO2 91 %.  Examination: General Appearance: The patient is well-developed, well-nourished, and in no distress. Skin: Gross inspection of skin unremarkable. Head: normocephalic, no gross deformities. Eyes: no gross deformities noted. ENT: ears appear grossly normal no exudates. Neck: Supple. No thyromegaly. No LAD. Respiratory: no rhonchi noted. Cardiovascular: Normal S1 and S2 without murmur or rub. Extremities: No cyanosis. pulses are equal. Neurologic: Alert and oriented. No involuntary movements.  LABS: No results found for this or any previous visit (from the past 2160 hour(s)).  Radiology: Mm Screening  Breast Tomo Bilateral  Result Date: 10/31/2016 CLINICAL DATA:  Screening. EXAM: 2D DIGITAL SCREENING BILATERAL MAMMOGRAM WITH CAD AND ADJUNCT TOMO COMPARISON:  Previous exam(s). ACR Breast Density Category c: The breast tissue is heterogeneously dense, which may obscure small masses. FINDINGS: There are no findings suspicious for malignancy. Images were processed with CAD. IMPRESSION: No mammographic evidence of malignancy. A result letter of this screening mammogram will be mailed directly to the patient. RECOMMENDATION: Screening mammogram in one year. (Code:SM-B-01Y) BI-RADS CATEGORY  1: Negative. Electronically Signed   By: Pamelia Hoit M.D.   On: 10/31/2016 07:05    No results found.  No results found.    Assessment and Plan: Patient Active Problem List   Diagnosis Date Noted  . Nocturnal hypoxemia due to asthma 10/29/2017  .  Hyperlipidemia 07/23/2017  . Osteoporosis, post-menopausal 07/03/2017  . Chronic obstructive pulmonary disease, unspecified ([place]) 05/28/2017  . Pain in left knee 05/28/2017  . Essential (primary) hypertension 05/28/2017  . Cardiac arrhythmia 05/28/2017  . Hypothyroidism 05/28/2017  . Injury of right hip 06/15/2015  . Mild intermittent asthma without complication 29/56/2130    1. Chronic Asthma doing well spiro done will continue with present management medications were reviewed inhalers were reviewed proper usage of the inhalers was also reviewed. 2. SOB at baseline uses inhalers as prescribed.  Follow-up pulmonary functions and spirometry as ordered  General Counseling: I have discussed the findings of the evaluation and examination with Ashley Weiss.  I have also discussed any further diagnostic evaluation thatmay be needed or ordered today. Ashley Weiss verbalizes understanding of the findings of todays visit. We also reviewed her medications today and discussed drug interactions and side effects including but not limited excessive drowsiness and altered mental states. We  also discussed that there is always a risk not just to her but also people around her. she has been encouraged to call the office with any questions or concerns that should arise related to todays visit.  Orders Placed This Encounter  Procedures  . Spirometry with graph    Order Specific Question:   Where should this test be performed?    Answer:   [facility]     Time spent: 54min  I have personally obtained a history, examined the patient, evaluated laboratory and imaging results, formulated the assessment and plan and placed orders.    Allyne Gee, MD [facility] Pulmonary and Critical Care Sleep medicine

## 2017-10-30 NOTE — Patient Instructions (Signed)

## 2017-11-05 DIAGNOSIS — M810 Age-related osteoporosis without current pathological fracture: Secondary | ICD-10-CM | POA: Diagnosis not present

## 2017-11-19 DIAGNOSIS — H6062 Unspecified chronic otitis externa, left ear: Secondary | ICD-10-CM | POA: Diagnosis not present

## 2017-11-19 DIAGNOSIS — H903 Sensorineural hearing loss, bilateral: Secondary | ICD-10-CM | POA: Diagnosis not present

## 2017-11-19 DIAGNOSIS — H6123 Impacted cerumen, bilateral: Secondary | ICD-10-CM | POA: Diagnosis not present

## 2017-12-09 ENCOUNTER — Other Ambulatory Visit: Payer: Self-pay | Admitting: Internal Medicine

## 2017-12-09 DIAGNOSIS — Z1231 Encounter for screening mammogram for malignant neoplasm of breast: Secondary | ICD-10-CM

## 2017-12-25 ENCOUNTER — Other Ambulatory Visit: Payer: Self-pay

## 2017-12-25 MED ORDER — SIMVASTATIN 20 MG PO TABS: 20.0000 mg | ORAL_TABLET | Freq: Every day | ORAL | 1 refills | Status: DC

## 2017-12-25 MED ORDER — OLMESARTAN MEDOXOMIL 40 MG PO TABS: ORAL_TABLET | ORAL | 1 refills | Status: DC

## 2017-12-25 MED ORDER — METOPROLOL TARTRATE 100 MG PO TABS: ORAL_TABLET | ORAL | 1 refills | Status: DC

## 2017-12-25 MED ORDER — DIGOXIN 250 MCG PO TABS: 0.2500 mg | ORAL_TABLET | Freq: Every day | ORAL | 0 refills | Status: DC

## 2018-01-07 ENCOUNTER — Ambulatory Visit
Admission: RE | Admit: 2018-01-07 | Discharge: 2018-01-07 | Disposition: A | Discharge status: Home / Self Care | Payer: Medicare Other | Source: Ambulatory Visit | Attending: Internal Medicine | Admitting: Internal Medicine

## 2018-01-07 DIAGNOSIS — Z1231 Encounter for screening mammogram for malignant neoplasm of breast: Secondary | ICD-10-CM | POA: Diagnosis not present

## 2018-01-09 DIAGNOSIS — H903 Sensorineural hearing loss, bilateral: Secondary | ICD-10-CM | POA: Diagnosis not present

## 2018-01-09 DIAGNOSIS — R1314 Dysphagia, pharyngoesophageal phase: Secondary | ICD-10-CM | POA: Diagnosis not present

## 2018-01-13 ENCOUNTER — Other Ambulatory Visit: Payer: Self-pay | Admitting: Otolaryngology

## 2018-01-13 DIAGNOSIS — R1312 Dysphagia, oropharyngeal phase: Secondary | ICD-10-CM

## 2018-01-21 DIAGNOSIS — Z961 Presence of intraocular lens: Secondary | ICD-10-CM | POA: Diagnosis not present

## 2018-01-28 ENCOUNTER — Ambulatory Visit: Payer: Medicare Other

## 2018-01-29 ENCOUNTER — Ambulatory Visit
Admission: RE | Admit: 2018-01-29 | Discharge: 2018-01-29 | Disposition: A | Discharge status: Home / Self Care | Payer: Medicare Other | Source: Ambulatory Visit | Attending: Otolaryngology | Admitting: Otolaryngology

## 2018-01-29 DIAGNOSIS — R1312 Dysphagia, oropharyngeal phase: Secondary | ICD-10-CM | POA: Insufficient documentation

## 2018-01-29 DIAGNOSIS — K219 Gastro-esophageal reflux disease without esophagitis: Secondary | ICD-10-CM | POA: Diagnosis not present

## 2018-01-29 DIAGNOSIS — R1310 Dysphagia, unspecified: Secondary | ICD-10-CM | POA: Diagnosis not present

## 2018-01-29 DIAGNOSIS — R1313 Dysphagia, pharyngeal phase: Secondary | ICD-10-CM

## 2018-01-29 NOTE — Therapy (Signed)
[place], [place], [address] Phone: [phone]   Fax:     Modified Barium Swallow  Patient Details  Name: Ashley Weiss MRN: [id number] Date of Birth: [birth year] No data recorded  Encounter Date: 01/29/2018  End of Session - 01/29/18 1347    Visit Number  1    Number of Visits  1    Date for SLP Re-Evaluation  01/29/18    SLP Start Time  1245    SLP Stop Time   1345    SLP Time Calculation (min)  60 min    Activity Tolerance  Patient tolerated treatment well       Past Medical History:  Diagnosis Date  . Arthritis   . Asthma   . Cancer ([place])    skin  . Depression   . DVT (deep venous thrombosis) ([place])   . Hyperlipidemia   . Hypertension   . Phlebitis     Past Surgical History:  Procedure Laterality Date  . BREAST CYST ASPIRATION Left 20+ yrs ago  . CATARACT EXTRACTION Bilateral 2005,2009  . cyst removal-right shoulder  1972  . otosclerosis Left 1988   hardening of bone, other 1989 redone  . THYROIDECTOMY  1970   nodule and 1/2 bridge removal  . WRIST SURGERY Left 11/29/2009    There were no vitals filed for this visit.      Subjective: Patient behavior: (alertness, ability to follow instructions, etc.): pt denied any specific swallowing problems w/ foods or liquids; mild c/o difficulty w/ breads and a "discomfort" in her throat x1 episode but has not felt this again. Mild c/o throat clearing when drinking liquids but the occurrence is inconsistent w/ no pattern described by pt/Daughter who could not recall last occasion of such. Mild concern for r/o GI dysmotility/Reflux w/ ENT. Not a PPI currently. Pt (and Daughter) denied any Pulmonary decline in "2 years". Min HOH. Natural Dentition. Chief complaint: dysphagia    Objective:  Radiological Procedure: A videoflouroscopic evaluation of oral-preparatory, reflex initiation, and pharyngeal phases of the swallow was performed;  as well as a screening of the upper esophageal phase.  I. POSTURE: upright II. VIEW: lateral III. COMPENSATORY STRATEGIES: none indicated at this time IV. BOLUSES ADMINISTERED:  Thin Liquid: multiple sips via Cup  Nectar-thick Liquid: 2 sips via Cup  Honey-thick Liquid: NT  Puree: 2 trials(not fond of applesauce)  Mechanical Soft: 2 trials V. RESULTS OF EVALUATION: A. ORAL PREPARATORY PHASE: (The lips, tongue, and velum are observed for strength and coordination)       **Overall Severity Rating: WFL. Appropriate bolus management for mastication, cohesive A-P transfer, and oral clearing post swallow. Missing molar.  B. SWALLOW INITIATION/REFLEX: (The reflex is normal if "triggered" by the time the bolus reached the base of the tongue)  **Overall Severity Rating: grossly WFL-Mild. Timing of the pharyngeal swallow appeared [facility] for all consistencies - pharyngeal swallow appeared to trigger as liquid boluses were spilling to/at/from the Valleculae; increased texture food trials triggered spilling to/at the Valleculae. Noted a slight decrease in complete closure of the airway entrance w/ the liquid trials resulting in "flash" laryngeal penetration during the swallow - the majority of the bolus material appeared to clear the underneath side of the Epiglottis w/ completion of the swallow. There was NO buildup of the liquid bolus residue in the laryngeal vestibule during this study. Radiologist noted apparent calcification of the cartilage.   C. PHARYNGEAL PHASE: (Pharyngeal function is  normal if the bolus shows rapid, smooth, and continuous transit through the pharynx and there is no pharyngeal residue after the swallow)  **Overall Severity Rating: [facility]. Adequate clearing of all bolus material post swallowing indicating adequate laryngeal excursion and pharyngeal pressure(driving the boluses) during the swallow.  D. LARYNGEAL PENETRATION: (Material entering into the laryngeal inlet/vestibule but not  aspirated): "flash" occurrence x2 w/ both thin and Nectar liquids - no buildup of residue in the vestibule noted E. ASPIRATION: NONE F. ESOPHAGEAL PHASE: (Screening of the upper esophagus): appeared [facility] w/ no dysmotility noted of the viewable Esophagus.  ASSESSMENT: Patient appeared to present w/ a grossly WFL oropharyngeal phase swallow function w/ NO aspiration noted during this study. Pt appears at reduced risk for aspiration when following general aspiration precautions during oral intake; swallowing Pills w/ a Puree(yogurt) is recommended. During the pharyngeal phase, there was a minimal deficit in pt's ability to attain tight airway closure during the swallow w/ liquids. Timing of the pharyngeal swallow appeared grossly [facility] for all consistencies - pharyngeal swallow appeared to trigger as liquid boluses were spilling to/at/from the Valleculae; increased texture food trials triggered while spilling to/at the Valleculae. Noted the slight decrease in complete closure of the airway entrance w/ the liquid trials resulting in "flash" laryngeal penetration during the swallowing - the majority of the bolus material appeared to clear the underneath side of the Epiglottis w/ completion of the swallow, or a f/u (involuntary) swallow. There was NO buildup of the liquid bolus residue in the laryngeal vestibule during this study. Radiologist noted apparent calcification of the cartilage - unsure if this could restrict tight, complete closure in any way. Adequate clearing of all bolus material post swallowing was noted indicating adequate laryngeal excursion and pharyngeal pressure(driving of the boluses) during the swallow. During the oral phase, appropriate bolus management for mastication was noted (missing molar), then cohesive A-P transfer and oral clearing of all boluses post swallow. Pt fed self w/out difficulty.  Thorough education given on general aspiration precautions including use of a Puree(yogurt) when  swallowing Pills in order to lessen any risk for aspiration. As pt has not had any apparent negative sequelae (Pulmonary decline) from the transient, "flash" laryngeal penetration w/ liquids in her diet, no recommendations for adjusting her diet consistency or strategies were recommended at this time. Pt is active and mobile at home which supports Pulmonary health.    PLAN/RECOMMENDATIONS:  A. Diet: regular diet (well-cut meats, moistened foods including breads); thin liquids Via Cup (no straws recommended).  PILLS WHOLE IN PUREE (yogurt)  B. Swallowing Precautions: general aspiration precautions  C. Recommended consultation to: n/a  D. Therapy recommendations: n/a  E. Results and recommendations were discussed w/ pt and Daughter; viewed and discussed Video together; modeled aspiration precautions including Pills WHOLE in a Puree to pt and Daughter.            Dysphagia, pharyngeal phase  Oropharyngeal dysphagia - Plan: DG OP Swallowing Func-Medicare/Speech Path, DG OP Swallowing Func-Medicare/Speech Path        Problem List Patient Active Problem List   Diagnosis Date Noted  . Nocturnal hypoxemia due to asthma 10/29/2017  . Hyperlipidemia 07/23/2017  . Osteoporosis, post-menopausal 07/03/2017  . Chronic obstructive pulmonary disease, unspecified ([place]) 05/28/2017  . Pain in left knee 05/28/2017  . Essential (primary) hypertension 05/28/2017  . Cardiac arrhythmia 05/28/2017  . Hypothyroidism 05/28/2017  . Injury of right hip 06/15/2015  . Mild intermittent asthma without complication 49/70/2637  Britny Riel 01/29/2018, 1:48 PM  [facility] [place], [place], [address] Phone: [phone]   Fax:     Name: Ashley Weiss MRN: [id number] Date of Birth: 11-18-1919

## 2018-02-03 ENCOUNTER — Telehealth: Payer: Self-pay

## 2018-02-03 NOTE — Telephone Encounter (Signed)
Pt daughter [name] called and stated that she was at the pharmacy to get her mother the patient the high dose flu vaccine and they were out and its not available until 2wks, she wanted to make sure that it was ok for her mom to take the flu shot with the 4 strands (normal flu vaccine.)

## 2018-02-03 NOTE — Telephone Encounter (Signed)
Informed pt daughter that it was ok for her mom (Ashley Weiss the pt) to take the regular dose flu vaccine.

## 2018-02-04 DIAGNOSIS — Z23 Encounter for immunization: Secondary | ICD-10-CM | POA: Diagnosis not present

## 2018-02-18 DIAGNOSIS — H6123 Impacted cerumen, bilateral: Secondary | ICD-10-CM | POA: Diagnosis not present

## 2018-02-18 DIAGNOSIS — R1312 Dysphagia, oropharyngeal phase: Secondary | ICD-10-CM | POA: Diagnosis not present

## 2018-02-18 DIAGNOSIS — H903 Sensorineural hearing loss, bilateral: Secondary | ICD-10-CM | POA: Diagnosis not present

## 2018-03-17 ENCOUNTER — Encounter: Payer: Self-pay | Admitting: Internal Medicine

## 2018-03-18 ENCOUNTER — Telehealth: Payer: Self-pay

## 2018-03-18 MED ORDER — DIGOXIN 250 MCG PO TABS: 0.2500 mg | ORAL_TABLET | Freq: Every day | ORAL | 0 refills | Status: DC

## 2018-03-18 NOTE — Telephone Encounter (Signed)
We received fax or digoxin we esend

## 2018-03-23 ENCOUNTER — Other Ambulatory Visit: Payer: Self-pay

## 2018-03-23 ENCOUNTER — Other Ambulatory Visit: Payer: Self-pay | Admitting: Nurse Practitioner

## 2018-03-23 MED ORDER — IPRATROPIUM-ALBUTEROL 0.5-2.5 (3) MG/3ML IN SOLN: 3.0000 mL | Freq: Three times a day (TID) | RESPIRATORY_TRACT | 0 refills | Status: DC | PRN

## 2018-03-23 MED ORDER — SERTRALINE HCL 100 MG PO TABS: 100.0000 mg | ORAL_TABLET | Freq: Every day | ORAL | 2 refills | Status: DC

## 2018-03-24 ENCOUNTER — Ambulatory Visit (INDEPENDENT_AMBULATORY_CARE_PROVIDER_SITE_OTHER): Payer: Medicare Other | Admitting: Internal Medicine

## 2018-03-24 ENCOUNTER — Encounter: Payer: Self-pay | Admitting: Internal Medicine

## 2018-03-24 VITALS — BP 152/78 | HR 73 | Resp 16 | Ht 62.0 in | Wt 138.0 lb

## 2018-03-24 DIAGNOSIS — J45909 Unspecified asthma, uncomplicated: Secondary | ICD-10-CM

## 2018-03-24 DIAGNOSIS — I499 Cardiac arrhythmia, unspecified: Secondary | ICD-10-CM

## 2018-03-24 DIAGNOSIS — M810 Age-related osteoporosis without current pathological fracture: Secondary | ICD-10-CM | POA: Diagnosis not present

## 2018-03-24 DIAGNOSIS — E785 Hyperlipidemia, unspecified: Secondary | ICD-10-CM | POA: Diagnosis not present

## 2018-03-24 DIAGNOSIS — R0902 Hypoxemia: Secondary | ICD-10-CM | POA: Diagnosis not present

## 2018-03-24 DIAGNOSIS — Z0001 Encounter for general adult medical examination with abnormal findings: Secondary | ICD-10-CM | POA: Diagnosis not present

## 2018-03-24 DIAGNOSIS — I10 Essential (primary) hypertension: Secondary | ICD-10-CM | POA: Diagnosis not present

## 2018-03-24 DIAGNOSIS — I6523 Occlusion and stenosis of bilateral carotid arteries: Secondary | ICD-10-CM

## 2018-03-24 NOTE — Progress Notes (Signed)
[facility] [place], [place] [address]  Internal MEDICINE  Office Visit Note  Patient Name: Ashley Weiss  [id number]  [id number]  Date of Service: 03/24/2018  Chief Complaint  Patient presents with  . Annual Exam  . Hypertension  . Hyperlipidemia  . Depression  . Asthma   HPI Pt is here for routine health maintenance examination. She is here with her daughter, stable. Takes all medications as prescribed. Sleeps well, no recent fall, asthma is well controlled Has atrial fib without anticoagulation Hx of carotid plaques, will need further follow up IV prolia by Dr Jefm Bryant  Current Medication: Outpatient Encounter Medications as of 03/24/2018  Medication Sig  . albuterol (PROVENTIL HFA;VENTOLIN HFA) 108 (90 Base) MCG/ACT inhaler Inhale 2 puffs into the lungs every 6 (six) hours as needed for wheezing or shortness of breath.  Marland Kitchen aspirin 81 MG tablet Take 81 mg by mouth daily.  Marland Kitchen denosumab (PROLIA) 60 MG/ML SOSY injection Inject 60 mg into the skin every 6 (six) months.  . digoxin (LANOXIN) 0.25 MG tablet Take 1 tablet (0.25 mg total) by mouth daily. Take estra 0.5 tab on Tuesday and Friday.  . fluticasone (FLONASE) 50 MCG/ACT nasal spray Place 1 spray into both nostrils daily. Use 1-2 sprays daily.  . fluticasone (FLOVENT HFA) 110 MCG/ACT inhaler Inhale 2 puffs into the lungs 2 (two) times daily.  Marland Kitchen ipratropium-albuterol (DUONEB) 0.5-2.5 (3) MG/3ML SOLN Take 3 mLs by nebulization 3 (three) times daily as needed. Use 1 vial 3 times daily as needed for SOB dia j45.1  . levofloxacin (LEVAQUIN) 500 MG tablet Take 500 mg by mouth daily.  Marland Kitchen levothyroxine (SYNTHROID, LEVOTHROID) 25 MCG tablet TAKE 1 DAILY BY MOUTH IN THE MORNING ON AN EMPTY STOMACH  . metoprolol tartrate (LOPRESSOR) 100 MG tablet Take 1.5 tablets twice daily.  . mometasone (ELOCON) 0.1 % ointment Apply topically.  . montelukast (SINGULAIR) 10 MG tablet TAKE 1 TABLET BY MOUTH EVERY DAY FOR COUGH   . olmesartan (BENICAR) 40 MG tablet Take 1 tab po daily  . OXYGEN Inhale 2 L into the lungs every evening. Sleep with nasal cannula w/ O2 at 2lpm.  . sertraline (ZOLOFT) 100 MG tablet Take 1 tablet (100 mg total) by mouth daily.  . simvastatin (ZOCOR) 20 MG tablet Take 1 tablet (20 mg total) by mouth at bedtime.   No facility-administered encounter medications on file as of 03/24/2018.     Surgical History: Past Surgical History:  Procedure Laterality Date  . BREAST CYST ASPIRATION Left 20+ yrs ago  . CATARACT EXTRACTION Bilateral 2005,2009  . cyst removal-right shoulder  1972  . otosclerosis Left 1988   hardening of bone, other 1989 redone  . THYROIDECTOMY  1970   nodule and 1/2 bridge removal  . WRIST SURGERY Left 11/29/2009   Medical History: Past Medical History:  Diagnosis Date  . Arthritis   . Asthma   . Cancer ([place])    skin  . Depression   . DVT (deep venous thrombosis) ([place])   . Hyperlipidemia   . Hypertension   . Phlebitis     Family History: Family History  Problem Relation Age of Onset  . Osteoarthritis Mother   . Depression Father   . Breast cancer Neg Hx     Review of Systems  Constitutional: Negative for chills, diaphoresis and fatigue.  HENT: Negative for ear pain, postnasal drip and sinus pressure.   Eyes: Negative for photophobia, discharge, redness, itching and visual disturbance.  Respiratory: Negative for cough, shortness of breath and wheezing.   Cardiovascular: Negative for chest pain, palpitations and leg swelling.  Gastrointestinal: Negative for abdominal pain, constipation, diarrhea, nausea and vomiting.  Genitourinary: Negative for dysuria and flank pain.  Musculoskeletal: Negative for arthralgias, back pain, gait problem and neck pain.  Skin: Negative for color change.  Allergic/Immunologic: Negative for environmental allergies and food allergies.  Neurological: Negative for dizziness and headaches.  Hematological: Does not  bruise/bleed easily.  Psychiatric/Behavioral: Negative for agitation, behavioral problems (depression) and hallucinations.   Vital Signs: BP (!) 152/78 (BP Location: Left Arm, Patient Position: Sitting, Cuff Size: Large)   Pulse 73   Resp 16   Ht 5\' 2"  (1.575 m)   Wt 138 lb (62.6 kg)   SpO2 95%   BMI 25.24 kg/m   Physical Exam  Constitutional: She is oriented to person, place, and time. She appears well-developed and well-nourished. No distress.  HENT:  Head: Normocephalic and atraumatic.  Mouth/Throat: Oropharynx is clear and moist. No oropharyngeal exudate.  Eyes: Pupils are equal, round, and reactive to light. EOM are normal.  Neck: Normal range of motion. Neck supple. No JVD present. No tracheal deviation present. No thyromegaly present.  Cardiovascular: Normal rate, regular rhythm and normal heart sounds. Exam reveals no gallop and no friction rub.  No murmur heard. Pulmonary/Chest: Effort normal. No respiratory distress. She has no wheezes. She has no rales. She exhibits no tenderness. Right breast exhibits no inverted nipple. Left breast exhibits tenderness. Left breast exhibits no inverted nipple.  Abdominal: Soft. Bowel sounds are normal.  Musculoskeletal: Normal range of motion.  Lymphadenopathy:    She has no cervical adenopathy.  Neurological: She is alert and oriented to person, place, and time. No cranial nerve deficit.  Skin: Skin is warm and dry. She is not diaphoretic.  Psychiatric: She has a normal mood and affect. Her behavior is normal. Judgment and thought content normal.   Assessment/Plan: 1. Encounter for general adult medical examination with abnormal findings -  Updated  - CBC with Differential/Platelet; Future - Lipid Panel With LDL/HDL Ratio; Future - TSH; Future - T4, free; Future - Comprehensive metabolic panel  2. Cardiac arrhythmia, unspecified cardiac arrhythmia type - Controlled with anticogaulation, will need follow up echo for LV dysfunction   - ECHOCARDIOGRAM COMPLETE; Future  3. Hyperlipidemia, unspecified hyperlipidemia type - Controlled with meds   4. Nocturnal hypoxemia due to asthma - Continue O2 at night   5. Essential (primary) hypertension - Controlled with meds   6. Occlusion and stenosis of bilateral carotid arteries - Follow up for progression  - US Carotid Bilateral; Future  7. Senile osteoporosis - Continue to see Dr Cristi Loron   General Counseling: Beatriz Stallion understanding of the findings of todays visit and agrees with plan of treatment. I have discussed any further diagnostic evaluation that may be needed or ordered today. We also reviewed her medications today. she has been encouraged to call the office with any questions or concerns that should arise related to todays visit.   Orders Placed This Encounter  Procedures  . US Carotid Bilateral  . CBC with Differential/Platelet  . Lipid Panel With LDL/HDL Ratio  . TSH  . T4, free  . Comprehensive metabolic panel  . ECHOCARDIOGRAM COMPLETE    Time spent: [place], MD  Internal Medicine

## 2018-03-24 NOTE — Addendum Note (Signed)
Addended by: Corlis Hove on: 03/24/2018 02:02 PM   Modules accepted: Orders

## 2018-03-25 LAB — UA/M W/RFLX CULTURE, ROUTINE
Bilirubin, UA: NEGATIVE
Glucose, UA: NEGATIVE
KETONES UA: NEGATIVE
Leukocytes, UA: NEGATIVE
Nitrite, UA: NEGATIVE
Protein, UA: NEGATIVE
RBC, UA: NEGATIVE
Specific Gravity, UA: 1.018 (ref 1.005–1.030)
Urobilinogen, Ur: 0.2 mg/dL (ref 0.2–1.0)
pH, UA: 6.5 (ref 5.0–7.5)

## 2018-03-25 LAB — MICROSCOPIC EXAMINATION
Bacteria, UA: NONE SEEN
Casts: NONE SEEN /lpf
Epithelial Cells (non renal): NONE SEEN /hpf (ref 0–10)

## 2018-04-12 ENCOUNTER — Ambulatory Visit
Admission: EM | Admit: 2018-04-12 | Discharge: 2018-04-12 | Disposition: A | Discharge status: Home / Self Care | Payer: Medicare Other | Attending: Family Medicine | Admitting: Family Medicine

## 2018-04-12 ENCOUNTER — Encounter: Payer: Self-pay | Admitting: Gynecology

## 2018-04-12 ENCOUNTER — Ambulatory Visit: Payer: Medicare Other

## 2018-04-12 ENCOUNTER — Other Ambulatory Visit: Payer: Self-pay

## 2018-04-12 DIAGNOSIS — Z85828 Personal history of other malignant neoplasm of skin: Secondary | ICD-10-CM | POA: Diagnosis not present

## 2018-04-12 DIAGNOSIS — Z79890 Hormone replacement therapy (postmenopausal): Secondary | ICD-10-CM | POA: Diagnosis not present

## 2018-04-12 DIAGNOSIS — Z86718 Personal history of other venous thrombosis and embolism: Secondary | ICD-10-CM | POA: Insufficient documentation

## 2018-04-12 DIAGNOSIS — E039 Hypothyroidism, unspecified: Secondary | ICD-10-CM | POA: Diagnosis not present

## 2018-04-12 DIAGNOSIS — Z7982 Long term (current) use of aspirin: Secondary | ICD-10-CM | POA: Insufficient documentation

## 2018-04-12 DIAGNOSIS — M549 Dorsalgia, unspecified: Secondary | ICD-10-CM | POA: Diagnosis present

## 2018-04-12 DIAGNOSIS — M1990 Unspecified osteoarthritis, unspecified site: Secondary | ICD-10-CM | POA: Insufficient documentation

## 2018-04-12 DIAGNOSIS — M546 Pain in thoracic spine: Secondary | ICD-10-CM | POA: Diagnosis not present

## 2018-04-12 DIAGNOSIS — J4520 Mild intermittent asthma, uncomplicated: Secondary | ICD-10-CM | POA: Insufficient documentation

## 2018-04-12 DIAGNOSIS — E785 Hyperlipidemia, unspecified: Secondary | ICD-10-CM | POA: Diagnosis not present

## 2018-04-12 DIAGNOSIS — Z7951 Long term (current) use of inhaled steroids: Secondary | ICD-10-CM | POA: Insufficient documentation

## 2018-04-12 DIAGNOSIS — S299XXA Unspecified injury of thorax, initial encounter: Secondary | ICD-10-CM | POA: Diagnosis not present

## 2018-04-12 DIAGNOSIS — I10 Essential (primary) hypertension: Secondary | ICD-10-CM | POA: Diagnosis not present

## 2018-04-12 DIAGNOSIS — F329 Major depressive disorder, single episode, unspecified: Secondary | ICD-10-CM | POA: Diagnosis not present

## 2018-04-12 DIAGNOSIS — Z79899 Other long term (current) drug therapy: Secondary | ICD-10-CM | POA: Diagnosis not present

## 2018-04-12 DIAGNOSIS — M810 Age-related osteoporosis without current pathological fracture: Secondary | ICD-10-CM | POA: Diagnosis not present

## 2018-04-12 DIAGNOSIS — J449 Chronic obstructive pulmonary disease, unspecified: Secondary | ICD-10-CM | POA: Diagnosis not present

## 2018-04-12 MED ORDER — TRAMADOL HCL 50 MG PO TABS: 50.0000 mg | ORAL_TABLET | Freq: Two times a day (BID) | ORAL | 0 refills | Status: DC | PRN

## 2018-04-12 NOTE — ED Triage Notes (Signed)
Per daughter , mom fell x yesterday while outside on her right side. Per daughter , mom c/o pain at upper right back.

## 2018-04-12 NOTE — ED Provider Notes (Signed)
[facility]    CSN: [id number] Arrival date & time: 04/12/18  0902  History   Chief Complaint Fall, back pain  HPI  84 year old female with known osteoporosis presents for evaluation after suffering a fall.  Patient suffered a fall yesterday.  Around 330.  She was outside removing the leads from her shrubbery.  She tripped on piping associated with heating and air unit and fell backwards.  No loss of consciousness.  She subsequently got up and called her daughter.  Daughter called her local physician and they recommended extract Tylenol and close monitoring as well as an urgent care visit today.  Patient states that she feels sore.  Reports mild neck pain but that she regularly has this.  States that she has right upper back pain around the scapula.  Daughter felt that the area was swollen.  The fact that she has osteoporosis, her daughter was concerned about fracture.  No visual disturbance.  No other reported symptoms.  No other complaints or concerns  Hx reviewed as below. Past Medical History:  Diagnosis Date  . Arthritis   . Asthma   . Cancer ([place])    skin  . Depression   . DVT (deep venous thrombosis) ([place])   . Hyperlipidemia   . Hypertension   . Phlebitis    Patient Active Problem List   Diagnosis Date Noted  . Nocturnal hypoxemia due to asthma 10/29/2017  . Hyperlipidemia 07/23/2017  . Osteoporosis, post-menopausal 07/03/2017  . Chronic obstructive pulmonary disease, unspecified ([place]) 05/28/2017  . Pain in left knee 05/28/2017  . Essential (primary) hypertension 05/28/2017  . Cardiac arrhythmia 05/28/2017  . Hypothyroidism 05/28/2017  . Injury of right hip 06/15/2015  . Mild intermittent asthma without complication 16/10/3708    Past Surgical History:  Procedure Laterality Date  . BREAST CYST ASPIRATION Left 20+ yrs ago  . CATARACT EXTRACTION Bilateral 2005,2009  . cyst removal-right shoulder  1972  . otosclerosis Left 1988   hardening of bone,  other 1989 redone  . THYROIDECTOMY  1970   nodule and 1/2 bridge removal  . WRIST SURGERY Left 11/29/2009    OB History   No obstetric history on file.      Home Medications    Prior to Admission medications   Medication Sig Start Date End Date Taking? Authorizing Provider  albuterol (PROVENTIL HFA;VENTOLIN HFA) 108 (90 Base) MCG/ACT inhaler Inhale 2 puffs into the lungs every 6 (six) hours as needed for wheezing or shortness of breath. 07/08/17  Yes Ronnell Freshwater, NP  aspirin 81 MG tablet Take 81 mg by mouth daily.   Yes [provider]  denosumab (PROLIA) 60 MG/ML SOSY injection Inject 60 mg into the skin every 6 (six) months.   Yes [provider]  digoxin (LANOXIN) 0.25 MG tablet Take 1 tablet (0.25 mg total) by mouth daily. Take estra 0.5 tab on Tuesday and Friday. 03/18/18  Yes Boscia, Heather E, NP  fluticasone (FLONASE) 50 MCG/ACT nasal spray Place 1 spray into both nostrils daily. Use 1-2 sprays daily. 10/14/17  Yes Boscia, Heather E, NP  fluticasone (FLOVENT HFA) 110 MCG/ACT inhaler Inhale 2 puffs into the lungs 2 (two) times daily.   Yes [provider]  ipratropium-albuterol (DUONEB) 0.5-2.5 (3) MG/3ML SOLN Take 3 mLs by nebulization 3 (three) times daily as needed. Use 1 vial 3 times daily as needed for SOB dia j45.1 03/23/18 06/21/18 Yes Boscia, Heather E, NP  levothyroxine (SYNTHROID, LEVOTHROID) 25 MCG tablet TAKE 1 DAILY  BY MOUTH IN THE MORNING ON AN EMPTY STOMACH 04/23/17  Yes Boscia, Heather E, NP  metoprolol tartrate (LOPRESSOR) 100 MG tablet Take 1.5 tablets twice daily. 12/25/17  Yes Boscia, Heather E, NP  montelukast (SINGULAIR) 10 MG tablet TAKE 1 TABLET BY MOUTH EVERY DAY FOR COUGH 09/29/17  Yes Ronnell Freshwater, NP  olmesartan (BENICAR) 40 MG tablet Take 1 tab po daily 12/25/17  Yes Boscia, Heather E, NP  OXYGEN Inhale 2 L into the lungs every evening. Sleep with nasal cannula w/ O2 at 2lpm.   Yes [provider]  sertraline (ZOLOFT)  100 MG tablet Take 1 tablet (100 mg total) by mouth daily. 03/23/18  Yes Boscia, Greer Ee, NP  simvastatin (ZOCOR) 20 MG tablet Take 1 tablet (20 mg total) by mouth at bedtime. 12/25/17  Yes Boscia, Heather E, NP  mometasone (ELOCON) 0.1 % ointment Apply topically.    [provider]  traMADol (ULTRAM) 50 MG tablet Take 1 tablet (50 mg total) by mouth every 12 (twelve) hours as needed for moderate pain or severe pain. 04/12/18   Coral Spikes, DO    Family History Family History  Problem Relation Age of [place]  . Osteoarthritis Mother   . Depression Father   . Breast cancer Neg Hx     Social History Social History   Tobacco Use  . Smoking status: Never Smoker  . Smokeless tobacco: Never Used  Substance Use Topics  . Alcohol use: No  . Drug use: No     Allergies   Biaxin [clarithromycin]; Ciprofloxacin; Hydralazine; and Meloxicam   Review of Systems Review of Systems  Musculoskeletal: Positive for back pain.       Soreness.  Neurological: Negative for syncope.   Physical Exam Triage Vital Signs ED Triage Vitals  Enc Vitals Group     BP 04/12/18 0922 (!) 198/66     Pulse Rate 04/12/18 0922 73     Resp 04/12/18 0922 16     Temp 04/12/18 0922 (!) 97.5 F (36.4 C)     Temp Source 04/12/18 0922 Oral     SpO2 04/12/18 0922 97 %     Weight 04/12/18 0924 137 lb 15.8 oz (62.6 kg)     Height --      Head Circumference --      Peak Flow --      Pain Score 04/12/18 0924 6     Pain Loc --      Pain Edu? --      Excl. in [place]? --    Updated Vital Signs BP (!) 198/66 (BP Location: Left Arm)   Pulse 73   Temp (!) 97.5 F (36.4 C) (Oral)   Resp 16   Wt 62.6 kg   SpO2 97%   BMI 25.24 kg/m   Visual Acuity Right Eye Distance:   Left Eye Distance:   Bilateral Distance:    Right Eye Near:   Left Eye Near:    Bilateral Near:     Physical Exam Vitals signs and nursing note reviewed.  Constitutional:      General: She is not in acute distress.    Appearance:  Normal appearance.  HENT:     Nose: Nose normal.     Mouth/Throat:     Pharynx: Oropharynx is clear. No posterior oropharyngeal erythema.  Eyes:     Conjunctiva/sclera: Conjunctivae normal.     Pupils: Pupils are equal, round, and reactive to light.  Neck:  Comments: No midline tenderness. Cardiovascular:     Rate and Rhythm: Normal rate and regular rhythm.  Pulmonary:     Effort: Pulmonary effort is normal.     Breath sounds: Normal breath sounds. No wheezing, rhonchi or rales.  Musculoskeletal:     Comments: Right upper thoracic region around the scapula slightly tender to palpation.  Thoracic kyphosis noted.  No appreciable bruising.  Skin:    Comments: Noted at the right wrist.  Neurological:     General: No focal deficit present.     Mental Status: She is alert.  Psychiatric:        Mood and Affect: Mood normal.        Behavior: Behavior normal.    UC Treatments / Results  Labs (all labs ordered are listed, but only abnormal results are displayed) Labs Reviewed - No data to display  EKG None  Radiology Dg Thoracic Spine 2 View  Result Date: 04/12/2018 CLINICAL DATA:  [place] yesterday, pain and swelling RIGHT upper back, known osteoporosis EXAM: THORACIC SPINE 2 VIEWS COMPARISON:  Chest radiographs 08/21/2015 FINDINGS: Diffuse osseous demineralization. Twelve pairs of ribs. Broad-based levoconvex thoracic scoliosis. Multilevel disc space narrowing and endplate spur formation. Vertebral body heights maintained without fracture or subluxation. Visualized ribs unremarkable. Atherosclerotic calcification aorta. IMPRESSION: No acute osseous abnormalities. Osseous demineralization with degenerative disc disease changes of the thoracic spine. Electronically Signed   By: Lavonia Dana M.D.   On: 04/12/2018 10:29    Procedures Procedures (including critical care time)  Medications Ordered in UC Medications - No data to display  Initial Impression / Assessment and Plan / UC  Course  I have reviewed the triage vital signs and the nursing notes.  Pertinent labs & imaging results that were available during my care of the patient were reviewed by me and considered in my medical decision making (see chart for details).    84 year old female presents with acute right-sided thoracic back pain.  X-ray was negative for fracture.  Tylenol and tramadol as needed for pain.  Final Clinical Impressions(s) / UC Diagnoses   Final diagnoses:  Acute right-sided thoracic back pain     Discharge Instructions     Xray was negative.  Tylenol for pain (Mild).  Tramadol for moderate to severe pain.  Take care  Dr. Lacinda Axon     ED Prescriptions    Medication Sig Dispense Auth. Provider   traMADol (ULTRAM) 50 MG tablet Take 1 tablet (50 mg total) by mouth every 12 (twelve) hours as needed for moderate pain or severe pain. 10 tablet Coral Spikes, DO     Controlled Substance Prescriptions [place] Controlled Substance Registry consulted? Not Applicable   Coral Spikes, DO 04/12/18 1044

## 2018-04-12 NOTE — Discharge Instructions (Signed)
Xray was negative.  Tylenol for pain (Mild).  Tramadol for moderate to severe pain.  Take care  Dr. Lacinda Axon

## 2018-04-23 ENCOUNTER — Other Ambulatory Visit: Payer: Self-pay

## 2018-04-23 MED ORDER — LEVOTHYROXINE SODIUM 25 MCG PO TABS: ORAL_TABLET | ORAL | 0 refills | Status: DC

## 2018-05-04 ENCOUNTER — Other Ambulatory Visit: Payer: Self-pay | Admitting: Nurse Practitioner

## 2018-05-04 MED ORDER — FLUTICASONE PROPIONATE 50 MCG/ACT NA SUSP: 1.0000 | Freq: Every day | NASAL | 3 refills | Status: DC

## 2018-05-07 ENCOUNTER — Ambulatory Visit (INDEPENDENT_AMBULATORY_CARE_PROVIDER_SITE_OTHER): Payer: Medicare Other | Admitting: Internal Medicine

## 2018-05-07 ENCOUNTER — Encounter: Payer: Self-pay | Admitting: Internal Medicine

## 2018-05-07 VITALS — BP 138/80 | HR 76 | Resp 16 | Ht 62.0 in | Wt 127.0 lb

## 2018-05-07 DIAGNOSIS — R0602 Shortness of breath: Secondary | ICD-10-CM

## 2018-05-07 DIAGNOSIS — J4520 Mild intermittent asthma, uncomplicated: Secondary | ICD-10-CM

## 2018-05-07 NOTE — Patient Instructions (Signed)
Asthma, Adult    Asthma is a long-term (chronic) condition in which the airways get tight and narrow. The airways are the breathing passages that lead from the nose and mouth down into the lungs. A person with asthma will have times when symptoms get worse. These are called asthma attacks. They can cause coughing, whistling sounds when you breathe (wheezing), shortness of breath, and chest pain. They can make it hard to breathe. There is no cure for asthma, but medicines and lifestyle changes can help control it.  There are many things that can bring on an asthma attack or make asthma symptoms worse (triggers). Common triggers include:  · Mold.  · Dust.  · Cigarette smoke.  · Cockroaches.  · Things that can cause allergy symptoms (allergens). These include animal skin flakes (dander) and pollen from trees or grass.  · Things that pollute the air. These may include household cleaners, wood smoke, smog, or chemical odors.  · Cold air, weather changes, and wind.  · Crying or laughing hard.  · Stress.  · Certain medicines or drugs.  · Certain foods such as dried fruit, potato chips, and grape juice.  · Infections, such as a cold or the flu.  · Certain medical conditions or diseases.  · Exercise or tiring activities.  Asthma may be treated with medicines and by staying away from the things that cause asthma attacks. Types of medicines may include:  · Controller medicines. These help prevent asthma symptoms. They are usually taken every day.  · Fast-acting reliever or rescue medicines. These quickly relieve asthma symptoms. They are used as needed and provide short-term relief.  · Allergy medicines if your attacks are brought on by allergens.  · Medicines to help control the body's defense (immune) system.  Follow these instructions at home:  Avoiding triggers in your home  · Change your heating and air conditioning filter often.  · Limit your use of fireplaces and wood stoves.  · Get rid of pests (such as roaches and  mice) and their droppings.  · Throw away plants if you see mold on them.  · Clean your floors. Dust regularly. Use cleaning products that do not smell.  · Have someone vacuum when you are not home. Use a vacuum cleaner with a HEPA filter if possible.  · Replace carpet with wood, tile, or vinyl flooring. Carpet can trap animal skin flakes and dust.  · Use allergy-proof pillows, mattress covers, and box spring covers.  · Wash bed sheets and blankets every week in hot water. Dry them in a dryer.  · Keep your bedroom free of any triggers.   · Avoid pets and keep windows closed when things that cause allergy symptoms are in the air.  · Use blankets that are made of polyester or cotton.  · Clean bathrooms and kitchens with bleach. If possible, have someone repaint the walls in these rooms with mold-resistant paint. Keep out of the rooms that are being cleaned and painted.  · Wash your hands often with soap and water. If soap and water are not available, use hand sanitizer.  · Do not allow anyone to smoke in your home.  General instructions  · Take over-the-counter and prescription medicines only as told by your doctor.  ? Talk with your doctor if you have questions about how or when to take your medicines.  ? Make note if you need to use your medicines more often than usual.  · Do not use any products that   contain nicotine or tobacco, such as cigarettes and e-cigarettes. If you need help quitting, ask your doctor.  · Stay away from secondhand smoke.  · Avoid doing things outdoors when allergen counts are high and when air quality is low.  · Wear a ski mask when doing outdoor activities in the winter. The mask should cover your nose and mouth. Exercise indoors on cold days if you can.  · Warm up before you exercise. Take time to cool down after exercise.  · Use a peak flow meter as told by your doctor. A peak flow meter is a tool that measures how well the lungs are working.  · Keep track of the peak flow meter's readings.  Write them down.  · Follow your asthma action plan. This is a written plan for taking care of your asthma and treating your attacks.  · Make sure you get all the shots (vaccines) that your doctor recommends. Ask your doctor about a flu shot and a pneumonia shot.  · Keep all follow-up visits as told by your doctor. This is important.  Contact a doctor if:  · You have wheezing, shortness of breath, or a cough even while taking medicine to prevent attacks.  · The mucus you cough up (sputum) is thicker than usual.  · The mucus you cough up changes from clear or white to yellow, green, gray, or bloody.  · You have problems from the medicine you are taking, such as:  ? A rash.  ? Itching.  ? Swelling.  ? Trouble breathing.  · You need reliever medicines more than 2-3 times a week.  · Your peak flow reading is still at 50-79% of your personal best after following the action plan for 1 hour.  · You have a fever.  Get help right away if:  · You seem to be worse and are not responding to medicine during an asthma attack.  · You are short of breath even at rest.  · You get short of breath when doing very little activity.  · You have trouble eating, drinking, or talking.  · You have chest pain or tightness.  · You have a fast heartbeat.  · Your lips or fingernails start to turn blue.  · You are light-headed or dizzy, or you faint.  · Your peak flow is less than 50% of your personal best.  · You feel too tired to breathe normally.  Summary  · Asthma is a long-term (chronic) condition in which the airways get tight and narrow. An asthma attack can make it hard to breathe.  · Asthma cannot be cured, but medicines and lifestyle changes can help control it.  · Make sure you understand how to avoid triggers and how and when to use your medicines.  This information is not intended to replace advice given to you by your health care provider. Make sure you discuss any questions you have with your health care provider.  Document  Released: 09/25/2007 Document Revised: 05/13/2016 Document Reviewed: 05/13/2016  [employer] Interactive Patient Education © 2019 [employer]

## 2018-05-07 NOTE — Progress Notes (Signed)
[facility] [place], [place] [address]  Pulmonary Sleep Medicine   Office Visit Note  Patient Name: Ashley Weiss DOB: 1919/07/09 MRN [id number]  Date of Service: 05/07/2018  Complaints/HPI: chronic asthma patient is doing well.  She has had no admissions to the hospital.  Daughter was present in the room and she states that the patient does follow instructions as far as her medications are concerned.  She denies having any fevers no chills.  No wheezing at this time.  Denies any cough and congestion.  ROS  General: (-) fever, (-) chills, (-) night sweats, (-) weakness Skin: (-) rashes, (-) itching,. Eyes: (-) visual changes, (-) redness, (-) itching. Nose and Sinuses: (-) nasal stuffiness or itchiness, (-) postnasal drip, (-) nosebleeds, (-) sinus trouble. Mouth and Throat: (-) sore throat, (-) hoarseness. Neck: (-) swollen glands, (-) enlarged thyroid, (-) neck pain. Respiratory: - cough, (-) bloody sputum, - shortness of breath, - wheezing. Cardiovascular: - ankle swelling, (-) chest pain. Lymphatic: (-) lymph node enlargement. Neurologic: (-) numbness, (-) tingling. Psychiatric: (-) anxiety, (-) depression   Current Medication: Outpatient Encounter Medications as of 05/07/2018  Medication Sig  . albuterol (PROVENTIL HFA;VENTOLIN HFA) 108 (90 Base) MCG/ACT inhaler Inhale 2 puffs into the lungs every 6 (six) hours as needed for wheezing or shortness of breath.  Marland Kitchen aspirin 81 MG tablet Take 81 mg by mouth daily.  Marland Kitchen denosumab (PROLIA) 60 MG/ML SOSY injection Inject 60 mg into the skin every 6 (six) months.  . digoxin (LANOXIN) 0.25 MG tablet Take 1 tablet (0.25 mg total) by mouth daily. Take estra 0.5 tab on Tuesday and Friday.  . fluticasone (FLONASE) 50 MCG/ACT nasal spray Place 1 spray into both nostrils daily. Use 1-2 sprays daily.  . fluticasone (FLOVENT HFA) 110 MCG/ACT inhaler Inhale 2 puffs into the lungs 2 (two) times daily.  Marland Kitchen  ipratropium-albuterol (DUONEB) 0.5-2.5 (3) MG/3ML SOLN Take 3 mLs by nebulization 3 (three) times daily as needed. Use 1 vial 3 times daily as needed for SOB dia j45.1  . levothyroxine (SYNTHROID, LEVOTHROID) 25 MCG tablet TAKE 1 DAILY BY MOUTH IN THE MORNING ON AN EMPTY STOMACH  . metoprolol tartrate (LOPRESSOR) 100 MG tablet Take 1.5 tablets twice daily.  . mometasone (ELOCON) 0.1 % ointment Apply topically.  . montelukast (SINGULAIR) 10 MG tablet TAKE 1 TABLET BY MOUTH EVERY DAY FOR COUGH  . olmesartan (BENICAR) 40 MG tablet Take 1 tab po daily  . OXYGEN Inhale 2 L into the lungs every evening. Sleep with nasal cannula w/ O2 at 2lpm.  . sertraline (ZOLOFT) 100 MG tablet Take 1 tablet (100 mg total) by mouth daily.  . simvastatin (ZOCOR) 20 MG tablet Take 1 tablet (20 mg total) by mouth at bedtime.  . traMADol (ULTRAM) 50 MG tablet Take 1 tablet (50 mg total) by mouth every 12 (twelve) hours as needed for moderate pain or severe pain. (Patient not taking: Reported on 05/07/2018)   No facility-administered encounter medications on file as of 05/07/2018.     Surgical History: Past Surgical History:  Procedure Laterality Date  . BREAST CYST ASPIRATION Left 20+ yrs ago  . CATARACT EXTRACTION Bilateral 2005,2009  . cyst removal-right shoulder  1972  . otosclerosis Left 1988   hardening of bone, other 1989 redone  . THYROIDECTOMY  1970   nodule and 1/2 bridge removal  . WRIST SURGERY Left 11/29/2009    Medical History: Past Medical History:  Diagnosis Date  . Arthritis   .  Asthma   . Cancer ([place])    skin  . Depression   . DVT (deep venous thrombosis) ([place])   . Hyperlipidemia   . Hypertension   . Phlebitis     Family History: Family History  Problem Relation Age of Onset  . Osteoarthritis Mother   . Depression Father   . Breast cancer Neg Hx     Social History: Social History   Socioeconomic History  . Marital status: Widowed    Spouse name: Not on file  . Number of  children: Not on file  . Years of education: Not on file  . Highest education level: Not on file  Occupational History  . Not on file  Social Needs  . Financial resource strain: Not on file  . Food insecurity:    Worry: Not on file    Inability: Not on file  . Transportation needs:    Medical: Not on file    Non-medical: Not on file  Tobacco Use  . Smoking status: Never Smoker  . Smokeless tobacco: Never Used  Substance and Sexual Activity  . Alcohol use: No  . Drug use: No  . Sexual activity: Never  Lifestyle  . Physical activity:    Days per week: Not on file    Minutes per session: Not on file  . Stress: Not on file  Relationships  . Social connections:    Talks on phone: Not on file    Gets together: Not on file    Attends religious service: Not on file    Active member of club or organization: Not on file    Attends meetings of clubs or organizations: Not on file    Relationship status: Not on file  . Intimate partner violence:    Fear of current or ex partner: Not on file    Emotionally abused: Not on file    Physically abused: Not on file    Forced sexual activity: Not on file  Other Topics Concern  . Not on file  Social History Narrative  . Not on file    Vital Signs: Blood pressure 138/80, pulse 76, resp. rate 16, height 5\' 2"  (1.575 m), weight 127 lb (57.6 kg), SpO2 95 %.  Examination: General Appearance: The patient is well-developed, well-nourished, and in no distress. Skin: Gross inspection of skin unremarkable. Head: normocephalic, no gross deformities. Eyes: no gross deformities noted. ENT: ears appear grossly normal no exudates. Neck: Supple. No thyromegaly. No LAD. Respiratory: no rhonchi noted at this time. Cardiovascular: Normal S1 and S2 without murmur or rub. Extremities: No cyanosis. pulses are equal. Neurologic: Alert and oriented. No involuntary movements.  LABS: Recent Results (from the past 2160 hour(s))  UA/M w/rflx Culture,  Routine     Status: None   Collection Time: 03/24/18 10:45 AM  Result Value Ref Range   Specific Gravity, UA 1.018 1.005 - 1.030   pH, UA 6.5 5.0 - 7.5   Color, UA Yellow Yellow   Appearance Ur Clear Clear   Leukocytes, UA Negative Negative   Protein, UA Negative Negative/Trace   Glucose, UA Negative Negative   Ketones, UA Negative Negative   RBC, UA Negative Negative   Bilirubin, UA Negative Negative   Urobilinogen, Ur 0.2 0.2 - 1.0 mg/dL   Nitrite, UA Negative Negative   Microscopic Examination Comment     Comment: Microscopic follows if indicated.   Microscopic Examination See below:     Comment: Microscopic was indicated and was performed.  Urinalysis Reflex Comment     Comment: This specimen will not reflex to a Urine Culture.  Microscopic Examination     Status: Abnormal   Collection Time: 03/24/18 10:45 AM  Result Value Ref Range   WBC, UA 0-5 0 - 5 /hpf   RBC, UA 3-10 (A) 0 - 2 /hpf   Epithelial Cells (non renal) None seen 0 - 10 /hpf   Casts None seen None seen /lpf   Bacteria, UA None seen None seen/Few    Radiology: Dg Thoracic Spine 2 View  Result Date: 04/12/2018 CLINICAL DATA:  [place] yesterday, pain and swelling RIGHT upper back, known osteoporosis EXAM: THORACIC SPINE 2 VIEWS COMPARISON:  Chest radiographs 08/21/2015 FINDINGS: Diffuse osseous demineralization. Twelve pairs of ribs. Broad-based levoconvex thoracic scoliosis. Multilevel disc space narrowing and endplate spur formation. Vertebral body heights maintained without fracture or subluxation. Visualized ribs unremarkable. Atherosclerotic calcification aorta. IMPRESSION: No acute osseous abnormalities. Osseous demineralization with degenerative disc disease changes of the thoracic spine. Electronically Signed   By: Lavonia Dana M.D.   On: 04/12/2018 10:29    No results found.  Dg Thoracic Spine 2 View  Result Date: 04/12/2018 CLINICAL DATA:  [place] yesterday, pain and swelling RIGHT upper back, known  osteoporosis EXAM: THORACIC SPINE 2 VIEWS COMPARISON:  Chest radiographs 08/21/2015 FINDINGS: Diffuse osseous demineralization. Twelve pairs of ribs. Broad-based levoconvex thoracic scoliosis. Multilevel disc space narrowing and endplate spur formation. Vertebral body heights maintained without fracture or subluxation. Visualized ribs unremarkable. Atherosclerotic calcification aorta. IMPRESSION: No acute osseous abnormalities. Osseous demineralization with degenerative disc disease changes of the thoracic spine. Electronically Signed   By: Lavonia Dana M.D.   On: 04/12/2018 10:29      Assessment and Plan: Patient Active Problem List   Diagnosis Date Noted  . Nocturnal hypoxemia due to asthma 10/29/2017  . Hyperlipidemia 07/23/2017  . Osteoporosis, post-menopausal 07/03/2017  . Chronic obstructive pulmonary disease, unspecified ([place]) 05/28/2017  . Pain in left knee 05/28/2017  . Essential (primary) hypertension 05/28/2017  . Cardiac arrhythmia 05/28/2017  . Hypothyroidism 05/28/2017  . Injury of right hip 06/15/2015  . Mild intermittent asthma without complication 66/29/4765    1. Chronic Asthma stable at this time we will continue to monitor her closely.  She will continue with current regimen and management. 2. SOB at baseline she is actually short of breath when she exerts herself excessively.  We will continue to monitor continue with current management for her asthma which is stable  General Counseling: I have discussed the findings of the evaluation and examination with Greta.  I have also discussed any further diagnostic evaluation thatmay be needed or ordered today. Tashianna verbalizes understanding of the findings of todays visit. We also reviewed her medications today and discussed drug interactions and side effects including but not limited excessive drowsiness and altered mental states. We also discussed that there is always a risk not just to her but also people around her. she has been  encouraged to call the office with any questions or concerns that should arise related to todays visit.    Time spent: 15 minutes  I have personally obtained a history, examined the patient, evaluated laboratory and imaging results, formulated the assessment and plan and placed orders.    Allyne Gee, MD [facility] Pulmonary and Critical Care Sleep medicine

## 2018-05-08 ENCOUNTER — Ambulatory Visit: Payer: Medicare Other

## 2018-05-08 DIAGNOSIS — Z0001 Encounter for general adult medical examination with abnormal findings: Secondary | ICD-10-CM | POA: Diagnosis not present

## 2018-05-08 DIAGNOSIS — M810 Age-related osteoporosis without current pathological fracture: Secondary | ICD-10-CM | POA: Diagnosis not present

## 2018-05-08 DIAGNOSIS — R0902 Hypoxemia: Secondary | ICD-10-CM

## 2018-05-08 DIAGNOSIS — I499 Cardiac arrhythmia, unspecified: Secondary | ICD-10-CM

## 2018-05-08 DIAGNOSIS — R55 Syncope and collapse: Secondary | ICD-10-CM | POA: Diagnosis not present

## 2018-05-08 DIAGNOSIS — J45909 Unspecified asthma, uncomplicated: Secondary | ICD-10-CM

## 2018-05-08 DIAGNOSIS — I10 Essential (primary) hypertension: Secondary | ICD-10-CM

## 2018-05-08 DIAGNOSIS — E785 Hyperlipidemia, unspecified: Secondary | ICD-10-CM

## 2018-05-09 LAB — COMPREHENSIVE METABOLIC PANEL
ALBUMIN: 3.8 g/dL (ref 3.2–4.6)
ALT: 26 IU/L (ref 0–32)
AST: 25 IU/L (ref 0–40)
Albumin/Globulin Ratio: 1.2 (ref 1.2–2.2)
Alkaline Phosphatase: 80 IU/L (ref 39–117)
BUN/Creatinine Ratio: 32 — ABNORMAL HIGH (ref 12–28)
BUN: 15 mg/dL (ref 10–36)
Bilirubin Total: 0.3 mg/dL (ref 0.0–1.2)
CO2: 29 mmol/L (ref 20–29)
CREATININE: 0.47 mg/dL — AB (ref 0.57–1.00)
Calcium: 9.1 mg/dL (ref 8.7–10.3)
Chloride: 92 mmol/L — ABNORMAL LOW (ref 96–106)
GFR calc Af Amer: 96 mL/min/{1.73_m2} (ref >59)
GFR calc non Af Amer: 84 mL/min/{1.73_m2} (ref >59)
Globulin, Total: 3.2 g/dL (ref 1.5–4.5)
Glucose: 232 mg/dL — ABNORMAL HIGH (ref 65–99)
Potassium: 4.3 mmol/L (ref 3.5–5.2)
Sodium: 136 mmol/L (ref 134–144)
Total Protein: 7 g/dL (ref 6.0–8.5)

## 2018-05-12 ENCOUNTER — Telehealth: Payer: Self-pay

## 2018-05-12 NOTE — Telephone Encounter (Signed)
Spoke with pt daughter about her glucose she was not fasting and I advised she can come to office for recheck she said she will call me back

## 2018-05-13 DIAGNOSIS — H6061 Unspecified chronic otitis externa, right ear: Secondary | ICD-10-CM | POA: Diagnosis not present

## 2018-05-13 DIAGNOSIS — H903 Sensorineural hearing loss, bilateral: Secondary | ICD-10-CM | POA: Diagnosis not present

## 2018-05-13 DIAGNOSIS — H6122 Impacted cerumen, left ear: Secondary | ICD-10-CM | POA: Diagnosis not present

## 2018-05-15 ENCOUNTER — Other Ambulatory Visit: Payer: Self-pay

## 2018-05-15 NOTE — Telephone Encounter (Signed)
Spoke with pt daughter she don't need pres refills from [facility]  [address]

## 2018-05-18 ENCOUNTER — Ambulatory Visit (INDEPENDENT_AMBULATORY_CARE_PROVIDER_SITE_OTHER): Payer: Medicare Other | Admitting: Adult Health

## 2018-05-18 ENCOUNTER — Encounter: Payer: Self-pay | Admitting: Adult Health

## 2018-05-18 VITALS — BP 128/86 | HR 68 | Temp 98.3°F | Resp 16 | Ht 62.0 in | Wt 132.0 lb

## 2018-05-18 DIAGNOSIS — R739 Hyperglycemia, unspecified: Secondary | ICD-10-CM

## 2018-05-18 DIAGNOSIS — I10 Essential (primary) hypertension: Secondary | ICD-10-CM

## 2018-05-18 DIAGNOSIS — E119 Type 2 diabetes mellitus without complications: Secondary | ICD-10-CM | POA: Diagnosis not present

## 2018-05-18 LAB — POCT GLYCOSYLATED HEMOGLOBIN (HGB A1C): Hemoglobin A1C: 7.2 % — AB (ref 4.0–5.6)

## 2018-05-18 MED ORDER — FREESTYLE FREEDOM LITE W/DEVICE KIT: 1.0000 | PACK | Freq: Every day | 0 refills | Status: DC

## 2018-05-18 MED ORDER — GLUCOSE BLOOD VI STRP: ORAL_STRIP | 12 refills | Status: DC

## 2018-05-18 MED ORDER — FREESTYLE LANCETS MISC: 12 refills | Status: DC

## 2018-05-18 NOTE — Patient Instructions (Signed)
Diabetes Mellitus and Nutrition, Adult  When you have diabetes (diabetes mellitus), it is very important to have healthy eating habits because your blood sugar (glucose) levels are greatly affected by what you eat and drink. Eating healthy foods in the appropriate amounts, at about the same times every day, can help you:  · Control your blood glucose.  · Lower your risk of heart disease.  · Improve your blood pressure.  · Reach or maintain a healthy weight.  Every person with diabetes is different, and each person has different needs for a meal plan. Your health care provider may recommend that you work with a diet and nutrition specialist (dietitian) to make a meal plan that is best for you. Your meal plan may vary depending on factors such as:  · The calories you need.  · The medicines you take.  · Your weight.  · Your blood glucose, blood pressure, and cholesterol levels.  · Your activity level.  · Other health conditions you have, such as heart or kidney disease.  How do carbohydrates affect me?  Carbohydrates, also called carbs, affect your blood glucose level more than any other type of food. Eating carbs naturally raises the amount of glucose in your blood. Carb counting is a method for keeping track of how many carbs you eat. Counting carbs is important to keep your blood glucose at a healthy level, especially if you use insulin or take certain oral diabetes medicines.  It is important to know how many carbs you can safely have in each meal. This is different for every person. Your dietitian can help you calculate how many carbs you should have at each meal and for each snack.  Foods that contain carbs include:  · Bread, cereal, rice, pasta, and crackers.  · Potatoes and corn.  · Peas, beans, and lentils.  · Milk and yogurt.  · Fruit and juice.  · Desserts, such as cakes, cookies, ice cream, and candy.  How does alcohol affect me?  Alcohol can cause a sudden decrease in blood glucose (hypoglycemia),  especially if you use insulin or take certain oral diabetes medicines. Hypoglycemia can be a life-threatening condition. Symptoms of hypoglycemia (sleepiness, dizziness, and confusion) are similar to symptoms of having too much alcohol.  If your health care provider says that alcohol is safe for you, follow these guidelines:  · Limit alcohol intake to no more than 1 drink per day for nonpregnant women and 2 drinks per day for men. One drink equals 12 oz of beer, 5 oz of wine, or 1½ oz of hard liquor.  · Do not drink on an empty stomach.  · Keep yourself hydrated with water, diet soda, or unsweetened iced tea.  · Keep in mind that regular soda, juice, and other mixers may contain a lot of sugar and must be counted as carbs.  What are tips for following this plan?    Reading food labels  · Start by checking the serving size on the "Nutrition Facts" label of packaged foods and drinks. The amount of calories, carbs, fats, and other nutrients listed on the label is based on one serving of the item. Many items contain more than one serving per package.  · Check the total grams (g) of carbs in one serving. You can calculate the number of servings of carbs in one serving by dividing the total carbs by 15. For example, if a food has 30 g of total carbs, it would be equal to 2   servings of carbs.  · Check the number of grams (g) of saturated and trans fats in one serving. Choose foods that have low or no amount of these fats.  · Check the number of milligrams (mg) of salt (sodium) in one serving. Most people should limit total sodium intake to less than 2,300 mg per day.  · Always check the nutrition information of foods labeled as "low-fat" or "nonfat". These foods may be higher in added sugar or refined carbs and should be avoided.  · Talk to your dietitian to identify your daily goals for nutrients listed on the label.  Shopping  · Avoid buying canned, premade, or processed foods. These foods tend to be high in fat, sodium,  and added sugar.  · Shop around the outside edge of the grocery store. This includes fresh fruits and vegetables, bulk grains, fresh meats, and fresh dairy.  Cooking  · Use low-heat cooking methods, such as baking, instead of high-heat cooking methods like deep frying.  · Cook using healthy oils, such as olive, canola, or sunflower oil.  · Avoid cooking with butter, cream, or high-fat meats.  Meal planning  · Eat meals and snacks regularly, preferably at the same times every day. Avoid going long periods of time without eating.  · Eat foods high in fiber, such as fresh fruits, vegetables, beans, and whole grains. Talk to your dietitian about how many servings of carbs you can eat at each meal.  · Eat 4-6 ounces (oz) of lean protein each day, such as lean meat, chicken, fish, eggs, or tofu. One oz of lean protein is equal to:  ? 1 oz of meat, chicken, or fish.  ? 1 egg.  ? ¼ cup of tofu.  · Eat some foods each day that contain healthy fats, such as avocado, nuts, seeds, and fish.  Lifestyle  · Check your blood glucose regularly.  · Exercise regularly as told by your health care provider. This may include:  ? 150 minutes of moderate-intensity or vigorous-intensity exercise each week. This could be brisk walking, biking, or water aerobics.  ? Stretching and doing strength exercises, such as yoga or weightlifting, at least 2 times a week.  · Take medicines as told by your health care provider.  · Do not use any products that contain nicotine or tobacco, such as cigarettes and e-cigarettes. If you need help quitting, ask your health care provider.  · Work with a counselor or diabetes educator to identify strategies to manage stress and any emotional and social challenges.  Questions to ask a health care provider  · Do I need to meet with a diabetes educator?  · Do I need to meet with a dietitian?  · What number can I call if I have questions?  · When are the best times to check my blood glucose?  Where to find more  information:  · American Diabetes Association: diabetes.org  · Academy of Nutrition and Dietetics: www.eatright.org  · National Institute of Diabetes and Digestive and Kidney Diseases (NIH): www.niddk.nih.gov  Summary  · A healthy meal plan will help you control your blood glucose and maintain a healthy lifestyle.  · Working with a diet and nutrition specialist (dietitian) can help you make a meal plan that is best for you.  · Keep in mind that carbohydrates (carbs) and alcohol have immediate effects on your blood glucose levels. It is important to count carbs and to use alcohol carefully.  This information is not intended to   replace advice given to you by your health care provider. Make sure you discuss any questions you have with your health care provider.  Document Released: 01/03/2005 Document Revised: 11/06/2016 Document Reviewed: 05/13/2016  [employer]vier Interactive Patient Education © 2019 [employer]

## 2018-05-18 NOTE — Progress Notes (Signed)
[facility] [place], [place] [address]  Internal MEDICINE  Office Visit Note  Patient Name: Ashley Weiss  [id number]  [id number]  Date of Service: 05/27/2018  Chief Complaint  Patient presents with  . elevated blood sugars    aic 7.2      HPI Pt is here for a sick visit.  Patient is in exam room with daughter.  Daughter who is a diabetes education nurse thinks patient is having faculty with lancets.  She feels that her mom does not have the dexterity to use the automatic lancets and would like a different kind.  She reports that her blood sugar has been doing fairly well but they are concerned that the meter they are using may be too complicated for her mom to use.  She is requesting a Freestyle Freedom lite meter.  A prescription will be provided at this visit.  Her A1c is checked at this visit and found to be 7.2.  The patient reports that her sister is diabetic and is on metformin and she is concerned that she should also be on metformin.  We discussed diet moderation of her blood sugar and that her current A1c is at an excellent level.  We discussed that we would add metformin if her A1c began to climb, but at this time dietary modification seems to be working for her.     Current Medication:  Outpatient Encounter Medications as of 05/18/2018  Medication Sig  . albuterol (PROVENTIL HFA;VENTOLIN HFA) 108 (90 Base) MCG/ACT inhaler Inhale 2 puffs into the lungs every 6 (six) hours as needed for wheezing or shortness of breath.  Marland Kitchen aspirin 81 MG tablet Take 81 mg by mouth daily.  Marland Kitchen denosumab (PROLIA) 60 MG/ML SOSY injection Inject 60 mg into the skin every 6 (six) months.  . digoxin (LANOXIN) 0.25 MG tablet Take 1 tablet (0.25 mg total) by mouth daily. Take estra 0.5 tab on Tuesday and Friday.  . fluticasone (FLONASE) 50 MCG/ACT nasal spray Place 1 spray into both nostrils daily. Use 1-2 sprays daily.  . fluticasone (FLOVENT HFA) 110 MCG/ACT inhaler Inhale 2  puffs into the lungs 2 (two) times daily.  Marland Kitchen ipratropium-albuterol (DUONEB) 0.5-2.5 (3) MG/3ML SOLN Take 3 mLs by nebulization 3 (three) times daily as needed. Use 1 vial 3 times daily as needed for SOB dia j45.1  . levothyroxine (SYNTHROID, LEVOTHROID) 25 MCG tablet TAKE 1 DAILY BY MOUTH IN THE MORNING ON AN EMPTY STOMACH  . metoprolol tartrate (LOPRESSOR) 100 MG tablet Take 1.5 tablets twice daily.  . mometasone (ELOCON) 0.1 % ointment Apply topically.  . montelukast (SINGULAIR) 10 MG tablet TAKE 1 TABLET BY MOUTH EVERY DAY FOR COUGH  . olmesartan (BENICAR) 40 MG tablet Take 1 tab po daily  . OXYGEN Inhale 2 L into the lungs every evening. Sleep with nasal cannula w/ O2 at 2lpm.  . sertraline (ZOLOFT) 100 MG tablet Take 1 tablet (100 mg total) by mouth daily.  . simvastatin (ZOCOR) 20 MG tablet Take 1 tablet (20 mg total) by mouth at bedtime.  . Blood Glucose Monitoring Suppl (FREESTYLE FREEDOM LITE) w/Device KIT 1 kit by Does not apply route daily.  . [DISCONTINUED] glucose blood (FREESTYLE LITE) test strip Use as instructed  . [DISCONTINUED] Lancets (FREESTYLE) lancets Use as instructed  . [DISCONTINUED] traMADol (ULTRAM) 50 MG tablet Take 1 tablet (50 mg total) by mouth every 12 (twelve) hours as needed for moderate pain or severe pain. (Patient not taking: Reported  on 05/07/2018)   No facility-administered encounter medications on file as of 05/18/2018.       Medical History: Past Medical History:  Diagnosis Date  . Arthritis   . Asthma   . Cancer ([place])    skin  . Depression   . DVT (deep venous thrombosis) ([employer])   . Hyperlipidemia   . Hypertension   . Phlebitis      Vital Signs: BP 128/86   Pulse 68   Temp 98.3 F (36.8 C) (Oral)   Resp 16   Ht 5' 2"  (1.575 m)   Wt 132 lb (59.9 kg)   SpO2 92%   BMI 24.14 kg/m    Review of Systems  Constitutional: Negative for chills, fatigue and unexpected weight change.  HENT: Negative for congestion, rhinorrhea, sneezing and  sore throat.   Eyes: Negative for photophobia, pain and redness.  Respiratory: Negative for cough, chest tightness and shortness of breath.   Cardiovascular: Negative for chest pain and palpitations.  Gastrointestinal: Negative for abdominal pain, constipation, diarrhea, nausea and vomiting.  Endocrine: Negative.   Genitourinary: Negative for dysuria and frequency.  Musculoskeletal: Negative for arthralgias, back pain, joint swelling and neck pain.  Skin: Negative for rash.  Allergic/Immunologic: Negative.   Neurological: Negative for tremors and numbness.  Hematological: Negative for adenopathy. Does not bruise/bleed easily.  Psychiatric/Behavioral: Negative for behavioral problems and sleep disturbance. The patient is not nervous/anxious.     Physical Exam Vitals signs and nursing note reviewed.  Constitutional:      General: She is not in acute distress.    Appearance: She is well-developed. She is not diaphoretic.  HENT:     Head: Normocephalic and atraumatic.     Mouth/Throat:     Pharynx: No oropharyngeal exudate.  Eyes:     Pupils: Pupils are equal, round, and reactive to light.  Neck:     Musculoskeletal: Normal range of motion and neck supple.     Thyroid: No thyromegaly.     Vascular: No JVD.     Trachea: No tracheal deviation.  Cardiovascular:     Rate and Rhythm: Normal rate and regular rhythm.     Heart sounds: Normal heart sounds. No murmur. No friction rub. No gallop.   Pulmonary:     Effort: Pulmonary effort is normal. No respiratory distress.     Breath sounds: Normal breath sounds. No wheezing or rales.  Chest:     Chest wall: No tenderness.  Abdominal:     Palpations: Abdomen is soft.     Tenderness: There is no abdominal tenderness. There is no guarding.  Musculoskeletal: Normal range of motion.  Lymphadenopathy:     Cervical: No cervical adenopathy.  Skin:    General: Skin is warm and dry.  Neurological:     Mental Status: She is alert and oriented  to person, place, and time.     Cranial Nerves: No cranial nerve deficit.  Psychiatric:        Behavior: Behavior normal.        Thought Content: Thought content normal.        Judgment: Judgment normal.     Assessment/Plan: 1. Diabetes mellitus without complication ([place]) Blood glucose monitoring supplies and meter ordered for patient this visit per her daughter's request. - Blood Glucose Monitoring Suppl (FREESTYLE FREEDOM LITE) w/Device KIT; 1 kit by Does not apply route daily.  Dispense: 1 each; Refill: 0  2. Elevated blood sugar Patient's hemoglobin A1c 7.2 today. - POCT HgB A1C  3. Essential (primary) hypertension Stable, continue current medication therapy.  General Counseling: Alverda verbalizes understanding of the findings of todays visit and agrees with plan of treatment. I have discussed any further diagnostic evaluation that may be needed or ordered today. We also reviewed her medications today. she has been encouraged to call the office with any questions or concerns that should arise related to todays visit.   Orders Placed This Encounter  Procedures  . POCT HgB A1C    Meds ordered this encounter  Medications  . Blood Glucose Monitoring Suppl (FREESTYLE FREEDOM LITE) w/Device KIT    Sig: 1 kit by Does not apply route daily.    Dispense:  1 each    Refill:  0  . DISCONTD: glucose blood (FREESTYLE LITE) test strip    Sig: Use as instructed    Dispense:  100 each    Refill:  12  . DISCONTD: Lancets (FREESTYLE) lancets    Sig: Use as instructed    Dispense:  100 each    Refill:  12    Time spent: 25 Minutes  This patient was seen by Orson Gear AGNP-C in Collaboration with Dr Lavera Guise as a part of collaborative care agreement.  Kendell Bane AGNP-C Internal Medicine

## 2018-05-19 ENCOUNTER — Other Ambulatory Visit: Payer: Self-pay

## 2018-05-19 DIAGNOSIS — E119 Type 2 diabetes mellitus without complications: Secondary | ICD-10-CM

## 2018-05-19 MED ORDER — FREESTYLE LANCETS MISC: 12 refills | Status: DC

## 2018-05-19 MED ORDER — GLUCOSE BLOOD VI STRP: ORAL_STRIP | 12 refills | Status: DC

## 2018-05-22 ENCOUNTER — Ambulatory Visit (INDEPENDENT_AMBULATORY_CARE_PROVIDER_SITE_OTHER): Payer: Medicare Other

## 2018-05-22 DIAGNOSIS — R0902 Hypoxemia: Secondary | ICD-10-CM

## 2018-05-22 DIAGNOSIS — Z0001 Encounter for general adult medical examination with abnormal findings: Secondary | ICD-10-CM

## 2018-05-22 DIAGNOSIS — I10 Essential (primary) hypertension: Secondary | ICD-10-CM

## 2018-05-22 DIAGNOSIS — I6523 Occlusion and stenosis of bilateral carotid arteries: Secondary | ICD-10-CM

## 2018-05-22 DIAGNOSIS — J45909 Unspecified asthma, uncomplicated: Secondary | ICD-10-CM

## 2018-05-22 DIAGNOSIS — I499 Cardiac arrhythmia, unspecified: Secondary | ICD-10-CM

## 2018-05-22 DIAGNOSIS — E785 Hyperlipidemia, unspecified: Secondary | ICD-10-CM

## 2018-06-10 DIAGNOSIS — M810 Age-related osteoporosis without current pathological fracture: Secondary | ICD-10-CM | POA: Diagnosis not present

## 2018-06-12 ENCOUNTER — Other Ambulatory Visit: Payer: Self-pay

## 2018-06-12 MED ORDER — METOPROLOL TARTRATE 100 MG PO TABS: ORAL_TABLET | ORAL | 1 refills | Status: DC

## 2018-06-15 ENCOUNTER — Other Ambulatory Visit: Payer: Self-pay

## 2018-06-15 MED ORDER — IPRATROPIUM-ALBUTEROL 0.5-2.5 (3) MG/3ML IN SOLN: 3.0000 mL | Freq: Three times a day (TID) | RESPIRATORY_TRACT | 0 refills | Status: DC | PRN

## 2018-06-17 NOTE — Procedures (Signed)
[place], [place] [address]  DATE OF SERVICE: May 22, 2018  CAROTID DOPPLER INTERPRETATION:  Bilateral Carotid Ultrsasound and Color Doppler Examination was performed. The RIGHT CCA shows mild plaque in the vessel. The LEFT CCA shows mild plaque in the vessel. There was no intimal thickening noted in the RIGHT carotid artery. There was no significant intimal thickening in the LEFT carotid artery.  The RIGHT CCA shows peak systolic velocity of 67 cm per second. The end diastolic velocity is 9 cm per second on the RIGHT side. The RIGHT ICA shows peak systolic velocity of 70 per second. RIGHT sided ICA end diastolic velocity is 14 cm per second. The RIGHT ECA shows a peak systolic velocity of 90 cm per second. The ICA/CCA ratio is calculated to be 0.9. This suggests less than 50% stenosis. The Vertebral Artery shows antegrade flow.  The LEFT CCA shows peak systolic velocity of 78 cm per second. The end diastolic velocity is 12 cm per second on the LEFT side. The LEFT ICA shows peak systolic velocity of 73 per second. LEFT sided ICA end diastolic velocity is 18 cm per second. The LEFT ECA shows a peak systolic velocity of 604 cm per second. The ICA/CCA ratio is calculated to be 0.9. This suggests less than 50% stenosis. The Vertebral Artery shows antegrade flow.   Impression:    The RIGHT CAROTID shows mild plaque with less than 50% stenosis. The LEFT CAROTID shows less than 50% stenosis.  There is mild plaque formation noted on the LEFT and mild plaque on the RIGHT  side. Consider a repeat Carotid doppler if clinical situation and symptoms warrant in 6-12 months. Patient should be encouraged to change lifestyles such as smoking cessation, regular exercise and dietary modification. Use of statins in the right clinical setting and ASA is encouraged.  Allyne Gee, MD [facility] Pulmonary Critical Care Medicine

## 2018-06-18 ENCOUNTER — Other Ambulatory Visit: Payer: Self-pay

## 2018-06-18 MED ORDER — MONTELUKAST SODIUM 10 MG PO TABS: ORAL_TABLET | ORAL | 2 refills | Status: DC

## 2018-06-18 MED ORDER — LEVOTHYROXINE SODIUM 25 MCG PO TABS: ORAL_TABLET | ORAL | 0 refills | Status: DC

## 2018-06-18 MED ORDER — SIMVASTATIN 20 MG PO TABS: 20.0000 mg | ORAL_TABLET | Freq: Every day | ORAL | 1 refills | Status: DC

## 2018-06-18 NOTE — Telephone Encounter (Signed)
Pt daughter advised esend simvastatin,singulair and [facility]othyroxine to [facility]

## 2018-06-24 ENCOUNTER — Encounter: Payer: Self-pay | Admitting: Adult Health

## 2018-06-24 ENCOUNTER — Other Ambulatory Visit: Payer: Self-pay

## 2018-06-24 ENCOUNTER — Ambulatory Visit (INDEPENDENT_AMBULATORY_CARE_PROVIDER_SITE_OTHER): Payer: Medicare Other | Admitting: Adult Health

## 2018-06-24 VITALS — BP 126/82 | HR 61 | Resp 16 | Ht 62.0 in | Wt 131.0 lb

## 2018-06-24 DIAGNOSIS — I10 Essential (primary) hypertension: Secondary | ICD-10-CM | POA: Diagnosis not present

## 2018-06-24 DIAGNOSIS — J449 Chronic obstructive pulmonary disease, unspecified: Secondary | ICD-10-CM

## 2018-06-24 DIAGNOSIS — E785 Hyperlipidemia, unspecified: Secondary | ICD-10-CM

## 2018-06-24 DIAGNOSIS — E119 Type 2 diabetes mellitus without complications: Secondary | ICD-10-CM

## 2018-06-24 DIAGNOSIS — M79604 Pain in right leg: Secondary | ICD-10-CM | POA: Diagnosis not present

## 2018-06-24 MED ORDER — DIGOXIN 250 MCG PO TABS: 0.2500 mg | ORAL_TABLET | Freq: Every day | ORAL | 1 refills | Status: DC

## 2018-06-24 MED ORDER — LEVOTHYROXINE SODIUM 25 MCG PO TABS: ORAL_TABLET | ORAL | 1 refills | Status: DC

## 2018-06-24 NOTE — Progress Notes (Signed)
[facility] [place], [place] [address]  Internal MEDICINE  Office Visit Note  Patient Name: Ashley Weiss  [id number]  [id number]  Date of Service: 06/24/2018  Chief Complaint  Patient presents with  . Hypertension  . Hyperlipidemia  . Leg Pain    shin is sore on the right leg     HPI  Pt is here for follow up on HTN, HLD.  Her blood pressure is well controlled.  Denies chest pain, palpitations, or sob. She does reports some intermittent pain with her left leg, over her tibia.  She is elevateding it at home with some relief.  Denies any falls or trauma.  No bruising.    Current Medication: Outpatient Encounter Medications as of 06/24/2018  Medication Sig  . albuterol (PROVENTIL HFA;VENTOLIN HFA) 108 (90 Base) MCG/ACT inhaler Inhale 2 puffs into the lungs every 6 (six) hours as needed for wheezing or shortness of breath.  Marland Kitchen aspirin 81 MG tablet Take 81 mg by mouth daily.  . Blood Glucose Monitoring Suppl (FREESTYLE FREEDOM LITE) w/Device KIT 1 kit by Does not apply route daily.  Marland Kitchen denosumab (PROLIA) 60 MG/ML SOSY injection Inject 60 mg into the skin every 6 (six) months.  . digoxin (LANOXIN) 0.25 MG tablet Take 1 tablet (0.25 mg total) by mouth daily. Take estra 0.5 tab on Tuesday and Friday.  . fluticasone (FLONASE) 50 MCG/ACT nasal spray Place 1 spray into both nostrils daily. Use 1-2 sprays daily.  . fluticasone (FLOVENT HFA) 110 MCG/ACT inhaler Inhale 2 puffs into the lungs 2 (two) times daily.  Marland Kitchen glucose blood (FREESTYLE LITE) test strip Use as directed once a daily Diag E11.65  . ipratropium-albuterol (DUONEB) 0.5-2.5 (3) MG/3ML SOLN Take 3 mLs by nebulization 3 (three) times daily as needed. Use 1 vial 3 times daily as needed for SOB dia j45.1  . Lancets (FREESTYLE) lancets Use as directed once daily diag e11.65  . levothyroxine (SYNTHROID, LEVOTHROID) 25 MCG tablet TAKE 1 DAILY BY MOUTH IN THE MORNING ON AN EMPTY STOMACH  . metoprolol tartrate  (LOPRESSOR) 100 MG tablet Take 1.5 tablets twice daily.  . mometasone (ELOCON) 0.1 % ointment Apply topically.  . montelukast (SINGULAIR) 10 MG tablet TAKE 1 TABLET BY MOUTH EVERY DAY FOR COUGH  . olmesartan (BENICAR) 40 MG tablet Take 1 tab po daily  . OXYGEN Inhale 2 L into the lungs every evening. Sleep with nasal cannula w/ O2 at 2lpm.  . sertraline (ZOLOFT) 100 MG tablet Take 1 tablet (100 mg total) by mouth daily.  . simvastatin (ZOCOR) 20 MG tablet Take 1 tablet (20 mg total) by mouth at bedtime.  . [DISCONTINUED] digoxin (LANOXIN) 0.25 MG tablet Take 1 tablet (0.25 mg total) by mouth daily. Take estra 0.5 tab on Tuesday and Friday.  . [DISCONTINUED] levothyroxine (SYNTHROID, LEVOTHROID) 25 MCG tablet TAKE 1 DAILY BY MOUTH IN THE MORNING ON AN EMPTY STOMACH   No facility-administered encounter medications on file as of 06/24/2018.     Surgical History: Past Surgical History:  Procedure Laterality Date  . BREAST CYST ASPIRATION Left 20+ yrs ago  . CATARACT EXTRACTION Bilateral 2005,2009  . cyst removal-right shoulder  1972  . otosclerosis Left 1988   hardening of bone, other 1989 redone  . THYROIDECTOMY  1970   nodule and 1/2 bridge removal  . WRIST SURGERY Left 11/29/2009    Medical History: Past Medical History:  Diagnosis Date  . Arthritis   . Asthma   . Cancer (  [place])    skin  . Depression   . DVT (deep venous thrombosis) ([place])   . Hyperlipidemia   . Hypertension   . Phlebitis     Family History: Family History  Problem Relation Age of Onset  . Osteoarthritis Mother   . Depression Father   . Breast cancer Neg Hx     Social History   Socioeconomic History  . Marital status: Widowed    Spouse name: Not on file  . Number of children: Not on file  . Years of education: Not on file  . Highest education level: Not on file  Occupational History  . Not on file  Social Needs  . Financial resource strain: Not on file  . Food insecurity:    Worry: Not on file     Inability: Not on file  . Transportation needs:    Medical: Not on file    Non-medical: Not on file  Tobacco Use  . Smoking status: Never Smoker  . Smokeless tobacco: Never Used  Substance and Sexual Activity  . Alcohol use: No  . Drug use: No  . Sexual activity: Never  Lifestyle  . Physical activity:    Days per week: Not on file    Minutes per session: Not on file  . Stress: Not on file  Relationships  . Social connections:    Talks on phone: Not on file    Gets together: Not on file    Attends religious service: Not on file    Active member of club or organization: Not on file    Attends meetings of clubs or organizations: Not on file    Relationship status: Not on file  . Intimate partner violence:    Fear of current or ex partner: Not on file    Emotionally abused: Not on file    Physically abused: Not on file    Forced sexual activity: Not on file  Other Topics Concern  . Not on file  Social History Narrative  . Not on file      Review of Systems  Constitutional: Negative for chills, fatigue and unexpected weight change.  HENT: Negative for congestion, rhinorrhea, sneezing and sore throat.   Eyes: Negative for photophobia, pain and redness.  Respiratory: Negative for cough, chest tightness and shortness of breath.   Cardiovascular: Negative for chest pain and palpitations.  Gastrointestinal: Negative for abdominal pain, constipation, diarrhea, nausea and vomiting.  Endocrine: Negative.   Genitourinary: Negative for dysuria and frequency.  Musculoskeletal: Negative for arthralgias, back pain, joint swelling and neck pain.       Leg pain RLL.  Skin: Negative for rash.  Allergic/Immunologic: Negative.   Neurological: Negative for tremors and numbness.  Hematological: Negative for adenopathy. Does not bruise/bleed easily.  Psychiatric/Behavioral: Negative for behavioral problems and sleep disturbance. The patient is not nervous/anxious.     Vital  Signs: BP 126/82   Pulse 61   Resp 16   Ht 5' 2"  (1.575 m)   Wt 131 lb (59.4 kg)   SpO2 94%   BMI 23.96 kg/m    Physical Exam Vitals signs and nursing note reviewed.  Constitutional:      General: She is not in acute distress.    Appearance: She is well-developed. She is not diaphoretic.  HENT:     Head: Normocephalic and atraumatic.     Mouth/Throat:     Pharynx: No oropharyngeal exudate.  Eyes:     Pupils: Pupils are equal, round, and  reactive to light.  Neck:     Musculoskeletal: Normal range of motion and neck supple.     Thyroid: No thyromegaly.     Vascular: No JVD.     Trachea: No tracheal deviation.  Cardiovascular:     Rate and Rhythm: Normal rate and regular rhythm.     Heart sounds: Normal heart sounds. No murmur. No friction rub. No gallop.   Pulmonary:     Effort: Pulmonary effort is normal. No respiratory distress.     Breath sounds: Normal breath sounds. No wheezing or rales.  Chest:     Chest wall: No tenderness.  Abdominal:     Palpations: Abdomen is soft.     Tenderness: There is no abdominal tenderness. There is no guarding.  Musculoskeletal: Normal range of motion.  Lymphadenopathy:     Cervical: No cervical adenopathy.  Skin:    General: Skin is warm and dry.  Neurological:     Mental Status: She is alert and oriented to person, place, and time.     Cranial Nerves: No cranial nerve deficit.  Psychiatric:        Behavior: Behavior normal.        Thought Content: Thought content normal.        Judgment: Judgment normal.    Assessment/Plan: 1. Diabetes mellitus without complication (H[facility]) Blood sugar log shows BS 120-160.  Pt has changed he diet drastically.  Doing very well.  Taking he sugar reading once or twice a day.  Continue present management.   2. Essential (primary) hypertension Stable, continue current therapy.   3. Hyperlipidemia, unspecified hyperlipidemia type Stable, continue current therapy  4. Chronic obstructive pulmonary  disease, unspecified COPD type ([place]) Stable, no hospitalizations.  Continue present management.   5. Pain of right lower extremity Encouraged patient to elevated extremity, and ice intermittently 20 minutes on, 2 hours off. RTC if symptoms worsen.   General Counseling: Laiya verbalizes understanding of the findings of todays visit and agrees with plan of treatment. I have discussed any further diagnostic evaluation that may be needed or ordered today. We also reviewed her medications today. she has been encouraged to call the office with any questions or concerns that should arise related to todays visit.    No orders of the defined types were placed in this encounter.   Meds ordered this encounter  Medications  . digoxin (LANOXIN) 0.25 MG tablet    Sig: Take 1 tablet (0.25 mg total) by mouth daily. Take estra 0.5 tab on Tuesday and Friday.    Dispense:  105 tablet    Refill:  1  . levothyroxine (SYNTHROID, LEVOTHROID) 25 MCG tablet    Sig: TAKE 1 DAILY BY MOUTH IN THE MORNING ON AN EMPTY STOMACH    Dispense:  90 tablet    Refill:  1    Time spent: 25 Minutes   This patient was seen by Orson Gear AGNP-C in Collaboration with Dr Lavera Guise as a part of collaborative care agreement     Kendell Bane AGNP-C Internal medicine

## 2018-06-24 NOTE — Patient Instructions (Signed)
Diabetes Mellitus and Nutrition, Adult  When you have diabetes (diabetes mellitus), it is very important to have healthy eating habits because your blood sugar (glucose) levels are greatly affected by what you eat and drink. Eating healthy foods in the appropriate amounts, at about the same times every day, can help you:  · Control your blood glucose.  · Lower your risk of heart disease.  · Improve your blood pressure.  · Reach or maintain a healthy weight.  Every person with diabetes is different, and each person has different needs for a meal plan. Your health care provider may recommend that you work with a diet and nutrition specialist (dietitian) to make a meal plan that is best for you. Your meal plan may vary depending on factors such as:  · The calories you need.  · The medicines you take.  · Your weight.  · Your blood glucose, blood pressure, and cholesterol levels.  · Your activity level.  · Other health conditions you have, such as heart or kidney disease.  How do carbohydrates affect me?  Carbohydrates, also called carbs, affect your blood glucose level more than any other type of food. Eating carbs naturally raises the amount of glucose in your blood. Carb counting is a method for keeping track of how many carbs you eat. Counting carbs is important to keep your blood glucose at a healthy level, especially if you use insulin or take certain oral diabetes medicines.  It is important to know how many carbs you can safely have in each meal. This is different for every person. Your dietitian can help you calculate how many carbs you should have at each meal and for each snack.  Foods that contain carbs include:  · Bread, cereal, rice, pasta, and crackers.  · Potatoes and corn.  · Peas, beans, and lentils.  · Milk and yogurt.  · Fruit and juice.  · Desserts, such as cakes, cookies, ice cream, and candy.  How does alcohol affect me?  Alcohol can cause a sudden decrease in blood glucose (hypoglycemia),  especially if you use insulin or take certain oral diabetes medicines. Hypoglycemia can be a life-threatening condition. Symptoms of hypoglycemia (sleepiness, dizziness, and confusion) are similar to symptoms of having too much alcohol.  If your health care provider says that alcohol is safe for you, follow these guidelines:  · Limit alcohol intake to no more than 1 drink per day for nonpregnant women and 2 drinks per day for men. One drink equals 12 oz of beer, 5 oz of wine, or 1½ oz of hard liquor.  · Do not drink on an empty stomach.  · Keep yourself hydrated with water, diet soda, or unsweetened iced tea.  · Keep in mind that regular soda, juice, and other mixers may contain a lot of sugar and must be counted as carbs.  What are tips for following this plan?    Reading food labels  · Start by checking the serving size on the "Nutrition Facts" label of packaged foods and drinks. The amount of calories, carbs, fats, and other nutrients listed on the label is based on one serving of the item. Many items contain more than one serving per package.  · Check the total grams (g) of carbs in one serving. You can calculate the number of servings of carbs in one serving by dividing the total carbs by 15. For example, if a food has 30 g of total carbs, it would be equal to 2   servings of carbs.  · Check the number of grams (g) of saturated and trans fats in one serving. Choose foods that have low or no amount of these fats.  · Check the number of milligrams (mg) of salt (sodium) in one serving. Most people should limit total sodium intake to less than 2,300 mg per day.  · Always check the nutrition information of foods labeled as "low-fat" or "nonfat". These foods may be higher in added sugar or refined carbs and should be avoided.  · Talk to your dietitian to identify your daily goals for nutrients listed on the label.  Shopping  · Avoid buying canned, premade, or processed foods. These foods tend to be high in fat, sodium,  and added sugar.  · Shop around the outside edge of the grocery store. This includes fresh fruits and vegetables, bulk grains, fresh meats, and fresh dairy.  Cooking  · Use low-heat cooking methods, such as baking, instead of high-heat cooking methods like deep frying.  · Cook using healthy oils, such as olive, canola, or sunflower oil.  · Avoid cooking with butter, cream, or high-fat meats.  Meal planning  · Eat meals and snacks regularly, preferably at the same times every day. Avoid going long periods of time without eating.  · Eat foods high in fiber, such as fresh fruits, vegetables, beans, and whole grains. Talk to your dietitian about how many servings of carbs you can eat at each meal.  · Eat 4-6 ounces (oz) of lean protein each day, such as lean meat, chicken, fish, eggs, or tofu. One oz of lean protein is equal to:  ? 1 oz of meat, chicken, or fish.  ? 1 egg.  ? ¼ cup of tofu.  · Eat some foods each day that contain healthy fats, such as avocado, nuts, seeds, and fish.  Lifestyle  · Check your blood glucose regularly.  · Exercise regularly as told by your health care provider. This may include:  ? 150 minutes of moderate-intensity or vigorous-intensity exercise each week. This could be brisk walking, biking, or water aerobics.  ? Stretching and doing strength exercises, such as yoga or weightlifting, at least 2 times a week.  · Take medicines as told by your health care provider.  · Do not use any products that contain nicotine or tobacco, such as cigarettes and e-cigarettes. If you need help quitting, ask your health care provider.  · Work with a counselor or diabetes educator to identify strategies to manage stress and any emotional and social challenges.  Questions to ask a health care provider  · Do I need to meet with a diabetes educator?  · Do I need to meet with a dietitian?  · What number can I call if I have questions?  · When are the best times to check my blood glucose?  Where to find more  information:  · American Diabetes Association: diabetes.org  · Academy of Nutrition and Dietetics: www.eatright.org  · National Institute of Diabetes and Digestive and Kidney Diseases (NIH): www.niddk.nih.gov  Summary  · A healthy meal plan will help you control your blood glucose and maintain a healthy lifestyle.  · Working with a diet and nutrition specialist (dietitian) can help you make a meal plan that is best for you.  · Keep in mind that carbohydrates (carbs) and alcohol have immediate effects on your blood glucose levels. It is important to count carbs and to use alcohol carefully.  This information is not intended to   replace advice given to you by your health care provider. Make sure you discuss any questions you have with your health care provider.  Document Released: 01/03/2005 Document Revised: 11/06/2016 Document Reviewed: 05/13/2016  [employer]vier Interactive Patient Education © 2019 [employer]

## 2018-07-06 ENCOUNTER — Other Ambulatory Visit: Payer: Self-pay

## 2018-07-06 MED ORDER — OLMESARTAN MEDOXOMIL 40 MG PO TABS: ORAL_TABLET | ORAL | 1 refills | Status: DC

## 2018-08-05 ENCOUNTER — Other Ambulatory Visit: Payer: Self-pay

## 2018-08-05 ENCOUNTER — Telehealth: Payer: Self-pay

## 2018-08-05 MED ORDER — FLUTICASONE PROPIONATE 50 MCG/ACT NA SUSP: 1.0000 | Freq: Every day | NASAL | 3 refills | Status: DC

## 2018-08-05 NOTE — Telephone Encounter (Signed)
error 

## 2018-08-25 NOTE — Progress Notes (Signed)
Will need 6-9 months follow up for carotids, pt takes a statin

## 2018-09-17 ENCOUNTER — Other Ambulatory Visit: Payer: Self-pay

## 2018-09-17 MED ORDER — IPRATROPIUM-ALBUTEROL 0.5-2.5 (3) MG/3ML IN SOLN: 3.0000 mL | Freq: Three times a day (TID) | RESPIRATORY_TRACT | 0 refills | Status: DC | PRN

## 2018-09-18 DIAGNOSIS — H6123 Impacted cerumen, bilateral: Secondary | ICD-10-CM | POA: Diagnosis not present

## 2018-09-18 DIAGNOSIS — H6062 Unspecified chronic otitis externa, left ear: Secondary | ICD-10-CM | POA: Diagnosis not present

## 2018-09-18 DIAGNOSIS — H903 Sensorineural hearing loss, bilateral: Secondary | ICD-10-CM | POA: Diagnosis not present

## 2018-09-30 ENCOUNTER — Encounter: Payer: Self-pay | Admitting: Adult Health

## 2018-09-30 ENCOUNTER — Other Ambulatory Visit: Payer: Self-pay | Admitting: Adult Health

## 2018-09-30 ENCOUNTER — Ambulatory Visit (INDEPENDENT_AMBULATORY_CARE_PROVIDER_SITE_OTHER): Payer: Medicare Other | Admitting: Adult Health

## 2018-09-30 ENCOUNTER — Other Ambulatory Visit: Payer: Self-pay

## 2018-09-30 VITALS — BP 144/68 | HR 60 | Resp 16 | Ht 64.0 in | Wt 129.0 lb

## 2018-09-30 DIAGNOSIS — E785 Hyperlipidemia, unspecified: Secondary | ICD-10-CM

## 2018-09-30 DIAGNOSIS — E1165 Type 2 diabetes mellitus with hyperglycemia: Secondary | ICD-10-CM

## 2018-09-30 DIAGNOSIS — I10 Essential (primary) hypertension: Secondary | ICD-10-CM | POA: Diagnosis not present

## 2018-09-30 LAB — POCT GLYCOSYLATED HEMOGLOBIN (HGB A1C): Hemoglobin A1C: 5.9 % — AB (ref 4.0–5.6)

## 2018-09-30 MED ORDER — IPRATROPIUM-ALBUTEROL 0.5-2.5 (3) MG/3ML IN SOLN: 3.0000 mL | Freq: Three times a day (TID) | RESPIRATORY_TRACT | 0 refills | Status: DC | PRN

## 2018-09-30 MED ORDER — METOPROLOL TARTRATE 100 MG PO TABS: ORAL_TABLET | ORAL | 1 refills | Status: DC

## 2018-09-30 MED ORDER — OLMESARTAN MEDOXOMIL 40 MG PO TABS: ORAL_TABLET | ORAL | 1 refills | Status: DC

## 2018-09-30 MED ORDER — SIMVASTATIN 20 MG PO TABS: 20.0000 mg | ORAL_TABLET | Freq: Every day | ORAL | 1 refills | Status: DC

## 2018-09-30 MED ORDER — DIGOXIN 250 MCG PO TABS: 0.2500 mg | ORAL_TABLET | Freq: Every day | ORAL | 1 refills | Status: DC

## 2018-09-30 MED ORDER — FLUTICASONE PROPIONATE HFA 110 MCG/ACT IN AERO: 2.0000 | INHALATION_SPRAY | Freq: Two times a day (BID) | RESPIRATORY_TRACT | 3 refills | Status: DC

## 2018-09-30 NOTE — Progress Notes (Signed)
[facility] [place], [place] [address]  Internal MEDICINE  Office Visit Note  Patient Name: Ashley Weiss  [id number]  [id number]  Date of Service: 09/30/2018  Chief Complaint  Patient presents with  . Medical Management of Chronic Issues    3 month follow up   . Diabetes  . Hypertension  . Hyperlipidemia  . Depression    HPI Pt is here for follow up on DM, HTN and HLD. Pt is here with daughter reporting patient has been doing well.  She has been watchign her diet and has continued to exercise.  Overall she is doing well and has no complaints. Her BP is 144/68 today. Denies Chest pain, Shortness of breath, palpitations, headache, or blurred vision.     Current Medication: Outpatient Encounter Medications as of 09/30/2018  Medication Sig  . albuterol (PROVENTIL HFA;VENTOLIN HFA) 108 (90 Base) MCG/ACT inhaler Inhale 2 puffs into the lungs every 6 (six) hours as needed for wheezing or shortness of breath.  Marland Kitchen aspirin 81 MG tablet Take 81 mg by mouth daily.  . Blood Glucose Monitoring Suppl (FREESTYLE FREEDOM LITE) w/Device KIT 1 kit by Does not apply route daily.  Marland Kitchen denosumab (PROLIA) 60 MG/ML SOSY injection Inject 60 mg into the skin every 6 (six) months.  . fluticasone (FLONASE) 50 MCG/ACT nasal spray Place 1 spray into both nostrils daily. Use 1-2 sprays daily.  . fluticasone (FLOVENT HFA) 110 MCG/ACT inhaler Inhale 2 puffs into the lungs 2 (two) times daily.  Marland Kitchen glucose blood (FREESTYLE LITE) test strip Use as directed once a daily Diag E11.65  . Lancets (FREESTYLE) lancets Use as directed once daily diag e11.65  . levothyroxine (SYNTHROID, LEVOTHROID) 25 MCG tablet TAKE 1 DAILY BY MOUTH IN THE MORNING ON AN EMPTY STOMACH  . mometasone (ELOCON) 0.1 % ointment Apply topically.  . montelukast (SINGULAIR) 10 MG tablet TAKE 1 TABLET BY MOUTH EVERY DAY FOR COUGH  . OXYGEN Inhale 2 L into the lungs every evening. Sleep with nasal cannula w/ O2 at 2lpm.  .  sertraline (ZOLOFT) 100 MG tablet Take 1 tablet (100 mg total) by mouth daily.  . [DISCONTINUED] digoxin (LANOXIN) 0.25 MG tablet Take 1 tablet (0.25 mg total) by mouth daily. Take estra 0.5 tab on Tuesday and Friday.  . [DISCONTINUED] fluticasone (FLOVENT HFA) 110 MCG/ACT inhaler Inhale 2 puffs into the lungs 2 (two) times daily.  . [DISCONTINUED] ipratropium-albuterol (DUONEB) 0.5-2.5 (3) MG/3ML SOLN Take 3 mLs by nebulization 3 (three) times daily as needed. Use 1 vial 3 times daily as needed for SOB dia j45.1  . [DISCONTINUED] metoprolol tartrate (LOPRESSOR) 100 MG tablet Take 1.5 tablets twice daily.  . [DISCONTINUED] olmesartan (BENICAR) 40 MG tablet Take 1 tab po daily  . [DISCONTINUED] simvastatin (ZOCOR) 20 MG tablet Take 1 tablet (20 mg total) by mouth at bedtime.   No facility-administered encounter medications on file as of 09/30/2018.     Surgical History: Past Surgical History:  Procedure Laterality Date  . BREAST CYST ASPIRATION Left 20+ yrs ago  . CATARACT EXTRACTION Bilateral 2005,2009  . cyst removal-right shoulder  1972  . otosclerosis Left 1988   hardening of bone, other 1989 redone  . THYROIDECTOMY  1970   nodule and 1/2 bridge removal  . WRIST SURGERY Left 11/29/2009    Medical History: Past Medical History:  Diagnosis Date  . Arthritis   . Asthma   . Cancer ([place])    skin  . Depression   .  DVT (deep venous thrombosis) ([place])   . Hyperlipidemia   . Hypertension   . Phlebitis     Family History: Family History  Problem Relation Age of Onset  . Osteoarthritis Mother   . Depression Father   . Breast cancer Neg Hx     Social History   Socioeconomic History  . Marital status: Widowed    Spouse name: Not on file  . Number of children: Not on file  . Years of education: Not on file  . Highest education level: Not on file  Occupational History  . Not on file  Social Needs  . Financial resource strain: Not on file  . Food insecurity:    Worry: Not  on file    Inability: Not on file  . Transportation needs:    Medical: Not on file    Non-medical: Not on file  Tobacco Use  . Smoking status: Never Smoker  . Smokeless tobacco: Never Used  Substance and Sexual Activity  . Alcohol use: No  . Drug use: No  . Sexual activity: Never  Lifestyle  . Physical activity:    Days per week: Not on file    Minutes per session: Not on file  . Stress: Not on file  Relationships  . Social connections:    Talks on phone: Not on file    Gets together: Not on file    Attends religious service: Not on file    Active member of club or organization: Not on file    Attends meetings of clubs or organizations: Not on file    Relationship status: Not on file  . Intimate partner violence:    Fear of current or ex partner: Not on file    Emotionally abused: Not on file    Physically abused: Not on file    Forced sexual activity: Not on file  Other Topics Concern  . Not on file  Social History Narrative  . Not on file      Review of Systems  Vital Signs: BP (!) 144/68   Pulse 60   Resp 16   Ht 5' 4" (1.626 m)   Wt 129 lb (58.5 kg)   SpO2 96%   BMI 22.14 kg/m    Physical Exam Vitals signs and nursing note reviewed.  Constitutional:      General: She is not in acute distress.    Appearance: She is well-developed. She is not diaphoretic.  HENT:     Head: Normocephalic and atraumatic.     Mouth/Throat:     Pharynx: No oropharyngeal exudate.  Eyes:     Pupils: Pupils are equal, round, and reactive to light.  Neck:     Musculoskeletal: Normal range of motion and neck supple.     Thyroid: No thyromegaly.     Vascular: No JVD.     Trachea: No tracheal deviation.  Cardiovascular:     Rate and Rhythm: Normal rate and regular rhythm.     Heart sounds: Normal heart sounds. No murmur. No friction rub. No gallop.   Pulmonary:     Effort: Pulmonary effort is normal. No respiratory distress.     Breath sounds: Normal breath sounds. No  wheezing or rales.  Chest:     Chest wall: No tenderness.  Abdominal:     Palpations: Abdomen is soft.     Tenderness: There is no abdominal tenderness. There is no guarding.  Musculoskeletal: Normal range of motion.  Lymphadenopathy:     Cervical:  No cervical adenopathy.  Skin:    General: Skin is warm and dry.  Neurological:     Mental Status: She is alert and oriented to person, place, and time.     Cranial Nerves: No cranial nerve deficit.  Psychiatric:        Behavior: Behavior normal.        Thought Content: Thought content normal.        Judgment: Judgment normal.     Assessment/Plan: 1. Uncontrolled type 2 diabetes mellitus with hyperglycemia (HCC) A1C 5.9 today.  Patient will continue present management of diabetes. - POCT HgB A1C  2. Essential (primary) hypertension Patient's blood pressure slightly elevated today 144/68.  Daughter works when taking blood pressure at home it is normal.  Encouraged to bring a log blood pressures at next visit.  We will continue to monitor.  3. Hyperlipidemia, unspecified hyperlipidemia type We will get lipid panel on patient.  Patient currently on losartan will continue at this time.  General Counseling: Iria verbalizes understanding of the findings of todays visit and agrees with plan of treatment. I have discussed any further diagnostic evaluation that may be needed or ordered today. We also reviewed her medications today. she has been encouraged to call the office with any questions or concerns that should arise related to todays visit.    Orders Placed This Encounter  Procedures  . POCT HgB A1C    No orders of the defined types were placed in this encounter.   Time spent: 20 Minutes   This patient was seen by Orson Gear AGNP-C in Collaboration with Dr Lavera Guise as a part of collaborative care agreement     Kendell Bane AGNP-C Internal medicine

## 2018-10-08 ENCOUNTER — Other Ambulatory Visit: Payer: Self-pay

## 2018-10-08 ENCOUNTER — Encounter: Payer: Self-pay | Admitting: Internal Medicine

## 2018-10-08 ENCOUNTER — Ambulatory Visit (INDEPENDENT_AMBULATORY_CARE_PROVIDER_SITE_OTHER): Payer: Medicare Other | Admitting: Internal Medicine

## 2018-10-08 DIAGNOSIS — E119 Type 2 diabetes mellitus without complications: Secondary | ICD-10-CM | POA: Diagnosis not present

## 2018-10-08 DIAGNOSIS — I480 Paroxysmal atrial fibrillation: Secondary | ICD-10-CM | POA: Diagnosis not present

## 2018-10-08 DIAGNOSIS — R000 Tachycardia, unspecified: Secondary | ICD-10-CM | POA: Diagnosis not present

## 2018-10-08 DIAGNOSIS — I952 Hypotension due to drugs: Secondary | ICD-10-CM

## 2018-10-08 NOTE — Progress Notes (Signed)
[facility] [place], [place] [address]  Internal MEDICINE  Telephone Visit  Patient Name: Ashley Weiss  [id number]  [id number]  Date of Service: 10/08/2018  I connected with the patient at 1205 by telephone and verified the patients identity using two identifiers. Her daughter is with her  I discussed the limitations, risks, security and privacy concerns of performing an evaluation and management service by telephone and the availability of in person appointments. I also discussed with the patient that there may be a patient responsible charge related to the service.  The patient expressed understanding and agrees to proceed.    Chief Complaint  Patient presents with  . Medical Management of Chronic Issues    low Blood Pressure yesterday 90/71 and heart rate  going up and down  . Hypertension  . Hyperlipidemia  . Depression    HPI  Pt is c/o not feeling well for the last 2 days, she felt tired, her legs felt weaker, blood pressure was below 100 few times and very elevated pulse, pt does have h/o atrial fib but has been stable, she has not had any other symptoms of dysuria, cough or c/p    Current Medication: Outpatient Encounter Medications as of 10/08/2018  Medication Sig  . albuterol (PROVENTIL HFA;VENTOLIN HFA) 108 (90 Base) MCG/ACT inhaler Inhale 2 puffs into the lungs every 6 (six) hours as needed for wheezing or shortness of breath.  Marland Kitchen aspirin 81 MG tablet Take 81 mg by mouth daily.  . Blood Glucose Monitoring Suppl (FREESTYLE FREEDOM LITE) w/Device KIT 1 kit by Does not apply route daily.  Marland Kitchen denosumab (PROLIA) 60 MG/ML SOSY injection Inject 60 mg into the skin every 6 (six) months.  . digoxin (LANOXIN) 0.25 MG tablet Take 1 tablet (0.25 mg total) by mouth daily. Take estra 0.5 tab on Tuesday and Friday.  . fluticasone (FLONASE) 50 MCG/ACT nasal spray Place 1 spray into both nostrils daily. Use 1-2 sprays daily.  . fluticasone (FLOVENT HFA) 110  MCG/ACT inhaler Inhale 2 puffs into the lungs 2 (two) times daily.  Marland Kitchen glucose blood (FREESTYLE LITE) test strip Use as directed once a daily Diag E11.65  . ipratropium-albuterol (DUONEB) 0.5-2.5 (3) MG/3ML SOLN Take 3 mLs by nebulization 3 (three) times daily as needed. Use 1 vial 3 times daily as needed for SOB dia j45.1  . Lancets (FREESTYLE) lancets Use as directed once daily diag e11.65  . levothyroxine (SYNTHROID, LEVOTHROID) 25 MCG tablet TAKE 1 DAILY BY MOUTH IN THE MORNING ON AN EMPTY STOMACH  . metoprolol tartrate (LOPRESSOR) 100 MG tablet Take 1.5 tablets twice daily.  . mometasone (ELOCON) 0.1 % ointment Apply topically.  . montelukast (SINGULAIR) 10 MG tablet TAKE 1 TABLET BY MOUTH EVERY DAY FOR COUGH  . olmesartan (BENICAR) 40 MG tablet Take 1 tab po daily  . OXYGEN Inhale 2 L into the lungs every evening. Sleep with nasal cannula w/ O2 at 2lpm.  . sertraline (ZOLOFT) 100 MG tablet Take 1 tablet (100 mg total) by mouth daily.  . simvastatin (ZOCOR) 20 MG tablet Take 1 tablet (20 mg total) by mouth at bedtime.   No facility-administered encounter medications on file as of 10/08/2018.     Surgical History: Past Surgical History:  Procedure Laterality Date  . BREAST CYST ASPIRATION Left 20+ yrs ago  . CATARACT EXTRACTION Bilateral 2005,2009  . cyst removal-right shoulder  1972  . otosclerosis Left 1988   hardening of bone, other 1989 redone  .  THYROIDECTOMY  1970   nodule and 1/2 bridge removal  . WRIST SURGERY Left 11/29/2009    Medical History: Past Medical History:  Diagnosis Date  . Arthritis   . Asthma   . Cancer ([place])    skin  . Depression   . DVT (deep venous thrombosis) ([place])   . Hyperlipidemia   . Hypertension   . Phlebitis     Family History: Family History  Problem Relation Age of Onset  . Osteoarthritis Mother   . Depression Father   . Breast cancer Neg Hx     Social History   Socioeconomic History  . Marital status: Widowed    Spouse name:  Not on file  . Number of children: Not on file  . Years of education: Not on file  . Highest education level: Not on file  Occupational History  . Not on file  Social Needs  . Financial resource strain: Not on file  . Food insecurity    Worry: Not on file    Inability: Not on file  . Transportation needs    Medical: Not on file    Non-medical: Not on file  Tobacco Use  . Smoking status: Never Smoker  . Smokeless tobacco: Never Used  Substance and Sexual Activity  . Alcohol use: No  . Drug use: No  . Sexual activity: Never  Lifestyle  . Physical activity    Days per week: Not on file    Minutes per session: Not on file  . Stress: Not on file  Relationships  . Social Herbalist on phone: Not on file    Gets together: Not on file    Attends religious service: Not on file    Active member of club or organization: Not on file    Attends meetings of clubs or organizations: Not on file    Relationship status: Not on file  . Intimate partner violence    Fear of current or ex partner: Not on file    Emotionally abused: Not on file    Physically abused: Not on file    Forced sexual activity: Not on file  Other Topics Concern  . Not on file  Social History Narrative  . Not on file   Review of Systems  Constitutional: Positive for fatigue. Negative for activity change.  HENT: Negative.   Respiratory: Negative.  Negative for cough.   Cardiovascular: Positive for palpitations.  Gastrointestinal: Negative.   Endocrine: Negative.   Genitourinary: Positive for dysuria.  Neurological: Positive for weakness.    Vital Signs: BP 116/81   Pulse (!) 136   Ht _0  (1.626 m)   Wt 127 lb (57.6 kg)   SpO2 94%   BMI 21.80 kg/m   Observation/Objective: Pt was able to communicate very well, was anxious and worried about her symptoms, NAD  Assessment/Plan: 1. Tachyarrhythmia Pt does have h/o atrial fib, has not seen a cardiologist in a while, was instructed to go to  ED, but prefers to see cardiology, might need echo, pt was instructed to go to ED if symptoms get worse   2. Hypotension due to drugs - Hold on Benicar for now for systolic bp below 159  3. Diabetes mellitus without complication (HCC) - Pt has lost weight and has restricted her calories too much  4. Paroxysmal atrial fibrillation (HCC) - Digoxin level; Future - TSH + free T4; Future  General Counseling: Aikam verbalizes understanding of the findings of today's phone visit  and agrees with plan of treatment. I have discussed any further diagnostic evaluation that may be needed or ordered today. We also reviewed her medications today. she has been encouraged to call the office with any questions or concerns that should arise related to todays visit.    Orders Placed This Encounter  Procedures  . Digoxin level  . TSH + free T4     Time spent:20 Minutes    Dr Lavera Guise Internal medicine

## 2018-10-09 ENCOUNTER — Other Ambulatory Visit: Payer: Self-pay

## 2018-10-09 ENCOUNTER — Telehealth: Payer: Self-pay

## 2018-10-09 DIAGNOSIS — E785 Hyperlipidemia, unspecified: Secondary | ICD-10-CM

## 2018-10-09 DIAGNOSIS — I480 Paroxysmal atrial fibrillation: Secondary | ICD-10-CM | POA: Diagnosis not present

## 2018-10-09 DIAGNOSIS — Z0001 Encounter for general adult medical examination with abnormal findings: Secondary | ICD-10-CM | POA: Diagnosis not present

## 2018-10-09 DIAGNOSIS — I952 Hypotension due to drugs: Secondary | ICD-10-CM | POA: Diagnosis not present

## 2018-10-09 DIAGNOSIS — R000 Tachycardia, unspecified: Secondary | ICD-10-CM | POA: Diagnosis not present

## 2018-10-09 DIAGNOSIS — R0902 Hypoxemia: Secondary | ICD-10-CM

## 2018-10-09 DIAGNOSIS — J45909 Unspecified asthma, uncomplicated: Secondary | ICD-10-CM

## 2018-10-09 DIAGNOSIS — I10 Essential (primary) hypertension: Secondary | ICD-10-CM

## 2018-10-09 DIAGNOSIS — I499 Cardiac arrhythmia, unspecified: Secondary | ICD-10-CM

## 2018-10-09 DIAGNOSIS — E119 Type 2 diabetes mellitus without complications: Secondary | ICD-10-CM

## 2018-10-09 LAB — CBC WITH DIFFERENTIAL/PLATELET
Basophils Absolute: 0.1 10*3/uL (ref 0.0–0.2)
Basos: 1 %
EOS (ABSOLUTE): 0.4 10*3/uL (ref 0.0–0.4)
Eos: 5 %
Hematocrit: 41.1 % (ref 34.0–46.6)
Hemoglobin: 13.8 g/dL (ref 11.1–15.9)
Immature Grans (Abs): 0 10*3/uL (ref 0.0–0.1)
Immature Granulocytes: 0 %
Lymphocytes Absolute: 1.6 10*3/uL (ref 0.7–3.1)
Lymphs: 20 %
MCH: 32.4 pg (ref 26.6–33.0)
MCHC: 33.6 g/dL (ref 31.5–35.7)
MCV: 97 fL (ref 79–97)
Monocytes Absolute: 0.7 10*3/uL (ref 0.1–0.9)
Monocytes: 8 %
Neutrophils Absolute: 5.3 10*3/uL (ref 1.4–7.0)
Neutrophils: 66 %
Platelets: 225 10*3/uL (ref 150–450)
RBC: 4.26 x10E6/uL (ref 3.77–5.28)
RDW: 12.2 % (ref 11.7–15.4)
WBC: 8.1 10*3/uL (ref 3.4–10.8)

## 2018-10-09 NOTE — Telephone Encounter (Signed)
Pt daughter called pt heart rate was 75 this morning and 118 advised pt daughter keep log and if her rate is high need to go ED other call us back Monday

## 2018-10-10 ENCOUNTER — Emergency Department: Payer: Medicare Other

## 2018-10-10 ENCOUNTER — Encounter: Payer: Self-pay | Admitting: Emergency Medicine

## 2018-10-10 ENCOUNTER — Observation Stay
Admission: EM | Admit: 2018-10-10 | Discharge: 2018-10-12 | Disposition: A | Discharge status: Home / Self Care | Payer: Medicare Other | Attending: Internal Medicine | Admitting: Internal Medicine

## 2018-10-10 ENCOUNTER — Other Ambulatory Visit: Payer: Self-pay

## 2018-10-10 DIAGNOSIS — Z79890 Hormone replacement therapy (postmenopausal): Secondary | ICD-10-CM | POA: Insufficient documentation

## 2018-10-10 DIAGNOSIS — J449 Chronic obstructive pulmonary disease, unspecified: Secondary | ICD-10-CM | POA: Diagnosis not present

## 2018-10-10 DIAGNOSIS — Z86718 Personal history of other venous thrombosis and embolism: Secondary | ICD-10-CM | POA: Insufficient documentation

## 2018-10-10 DIAGNOSIS — Z79899 Other long term (current) drug therapy: Secondary | ICD-10-CM | POA: Insufficient documentation

## 2018-10-10 DIAGNOSIS — R42 Dizziness and giddiness: Secondary | ICD-10-CM | POA: Diagnosis not present

## 2018-10-10 DIAGNOSIS — E1165 Type 2 diabetes mellitus with hyperglycemia: Secondary | ICD-10-CM | POA: Diagnosis not present

## 2018-10-10 DIAGNOSIS — M1990 Unspecified osteoarthritis, unspecified site: Secondary | ICD-10-CM | POA: Diagnosis not present

## 2018-10-10 DIAGNOSIS — Z85820 Personal history of malignant melanoma of skin: Secondary | ICD-10-CM | POA: Diagnosis not present

## 2018-10-10 DIAGNOSIS — I700 Atherosclerosis of aorta: Secondary | ICD-10-CM | POA: Insufficient documentation

## 2018-10-10 DIAGNOSIS — R7989 Other specified abnormal findings of blood chemistry: Secondary | ICD-10-CM | POA: Diagnosis not present

## 2018-10-10 DIAGNOSIS — I4891 Unspecified atrial fibrillation: Principal | ICD-10-CM | POA: Insufficient documentation

## 2018-10-10 DIAGNOSIS — E785 Hyperlipidemia, unspecified: Secondary | ICD-10-CM | POA: Insufficient documentation

## 2018-10-10 DIAGNOSIS — Z1159 Encounter for screening for other viral diseases: Secondary | ICD-10-CM | POA: Diagnosis not present

## 2018-10-10 DIAGNOSIS — E039 Hypothyroidism, unspecified: Secondary | ICD-10-CM | POA: Insufficient documentation

## 2018-10-10 DIAGNOSIS — Z7982 Long term (current) use of aspirin: Secondary | ICD-10-CM | POA: Insufficient documentation

## 2018-10-10 DIAGNOSIS — E119 Type 2 diabetes mellitus without complications: Secondary | ICD-10-CM | POA: Diagnosis not present

## 2018-10-10 DIAGNOSIS — Z7951 Long term (current) use of inhaled steroids: Secondary | ICD-10-CM | POA: Insufficient documentation

## 2018-10-10 DIAGNOSIS — R079 Chest pain, unspecified: Secondary | ICD-10-CM | POA: Diagnosis not present

## 2018-10-10 DIAGNOSIS — F329 Major depressive disorder, single episode, unspecified: Secondary | ICD-10-CM | POA: Insufficient documentation

## 2018-10-10 DIAGNOSIS — I10 Essential (primary) hypertension: Secondary | ICD-10-CM | POA: Insufficient documentation

## 2018-10-10 DIAGNOSIS — R51 Headache: Secondary | ICD-10-CM | POA: Diagnosis not present

## 2018-10-10 DIAGNOSIS — Z20828 Contact with and (suspected) exposure to other viral communicable diseases: Secondary | ICD-10-CM | POA: Diagnosis not present

## 2018-10-10 DIAGNOSIS — R0902 Hypoxemia: Secondary | ICD-10-CM | POA: Diagnosis not present

## 2018-10-10 DIAGNOSIS — Z66 Do not resuscitate: Secondary | ICD-10-CM | POA: Diagnosis not present

## 2018-10-10 DIAGNOSIS — I517 Cardiomegaly: Secondary | ICD-10-CM | POA: Diagnosis not present

## 2018-10-10 LAB — CBC
HCT: 41.2 % (ref 36.0–46.0)
Hemoglobin: 13.3 g/dL (ref 12.0–15.0)
MCH: 31.7 pg (ref 26.0–34.0)
MCHC: 32.3 g/dL (ref 30.0–36.0)
MCV: 98.3 fL (ref 80.0–100.0)
Platelets: 213 10*3/uL (ref 150–400)
RBC: 4.19 MIL/uL (ref 3.87–5.11)
RDW: 13.7 % (ref 11.5–15.5)
WBC: 7.7 10*3/uL (ref 4.0–10.5)
nRBC: 0 % (ref 0.0–0.2)

## 2018-10-10 LAB — COMPREHENSIVE METABOLIC PANEL
ALT: 21 U/L (ref 0–44)
AST: 20 U/L (ref 15–41)
Albumin: 3.2 g/dL — ABNORMAL LOW (ref 3.5–5.0)
Alkaline Phosphatase: 52 U/L (ref 38–126)
Anion gap: 8 (ref 5–15)
BUN: 17 mg/dL (ref 8–23)
CO2: 31 mmol/L (ref 22–32)
Calcium: 8.8 mg/dL — ABNORMAL LOW (ref 8.9–10.3)
Chloride: 99 mmol/L (ref 98–111)
Creatinine, Ser: 0.48 mg/dL (ref 0.44–1.00)
GFR calc Af Amer: >60 mL/min (ref >60)
GFR calc non Af Amer: >60 mL/min (ref >60)
Glucose, Bld: 176 mg/dL — ABNORMAL HIGH (ref 70–99)
Potassium: 4.3 mmol/L (ref 3.5–5.1)
Sodium: 138 mmol/L (ref 135–145)
Total Bilirubin: 0.7 mg/dL (ref 0.3–1.2)
Total Protein: 7.2 g/dL (ref 6.5–8.1)

## 2018-10-10 LAB — DIGOXIN LEVEL
Digoxin Level: 0.7 ng/mL — ABNORMAL LOW (ref 0.8–2.0)
Digoxin, Serum: 1 ng/mL — ABNORMAL HIGH (ref 0.5–0.9)

## 2018-10-10 LAB — LIPID PANEL WITH LDL/HDL RATIO
Cholesterol, Total: 119 mg/dL (ref 100–199)
HDL: 38 mg/dL — ABNORMAL LOW (ref >39)
LDL Calculated: 57 mg/dL (ref 0–99)
LDl/HDL Ratio: 1.5 ratio (ref 0.0–3.2)
Triglycerides: 121 mg/dL (ref 0–149)
VLDL Cholesterol Cal: 24 mg/dL (ref 5–40)

## 2018-10-10 LAB — TROPONIN I
Troponin I: 0.04 ng/mL (ref <0.03)
Troponin I: 0.03 ng/mL (ref <0.03)

## 2018-10-10 LAB — TSH: TSH: 4.66 u[IU]/mL — ABNORMAL HIGH (ref 0.450–4.500)

## 2018-10-10 LAB — T4, FREE: Free T4: 1.22 ng/dL (ref 0.82–1.77)

## 2018-10-10 LAB — SARS CORONAVIRUS 2 BY RT PCR (HOSPITAL ORDER, PERFORMED IN ~~LOC~~ HOSPITAL LAB): SARS Coronavirus 2: NEGATIVE

## 2018-10-10 MED ORDER — DIGOXIN 0.25 MG/ML IJ SOLN
0.1000 mg | Freq: Once | INTRAMUSCULAR | Status: AC
  Administered 2018-10-10: 0.1 mg via INTRAVENOUS
  Filled 2018-10-10: qty 2
  Filled 2018-10-10: qty 1

## 2018-10-10 MED ORDER — DILTIAZEM HCL 100 MG IV SOLR
5.0000 mg/h | INTRAVENOUS | Status: DC
  Administered 2018-10-10: 2.5 mg/h via INTRAVENOUS
  Filled 2018-10-10: qty 100

## 2018-10-10 MED ORDER — METOPROLOL TARTRATE 5 MG/5ML IV SOLN
5.0000 mg | Freq: Once | INTRAVENOUS | Status: AC
  Administered 2018-10-10: 5 mg via INTRAVENOUS

## 2018-10-10 MED ORDER — DIGOXIN 250 MCG PO TABS
0.2500 mg | ORAL_TABLET | Freq: Every day | ORAL | Status: DC
  Administered 2018-10-10 – 2018-10-12 (×3): 0.25 mg via ORAL
  Filled 2018-10-10 (×3): qty 1

## 2018-10-10 MED ORDER — METOPROLOL TARTRATE 5 MG/5ML IV SOLN
5.0000 mg | Freq: Once | INTRAVENOUS | Status: AC
  Administered 2018-10-10: 5 mg via INTRAVENOUS
  Filled 2018-10-10: qty 5

## 2018-10-10 MED ORDER — METOPROLOL TARTRATE 5 MG/5ML IV SOLN: 5.0000 mg | INTRAVENOUS | Status: DC | PRN

## 2018-10-10 MED ORDER — SERTRALINE HCL 50 MG PO TABS
100.0000 mg | ORAL_TABLET | Freq: Every day | ORAL | Status: DC
  Administered 2018-10-11 – 2018-10-12 (×2): 100 mg via ORAL
  Filled 2018-10-10 (×2): qty 2

## 2018-10-10 MED ORDER — ACETAMINOPHEN 325 MG PO TABS: 650.0000 mg | ORAL_TABLET | ORAL | Status: DC | PRN

## 2018-10-10 MED ORDER — BUDESONIDE 0.25 MG/2ML IN SUSP
0.2500 mg | Freq: Two times a day (BID) | RESPIRATORY_TRACT | Status: DC
  Administered 2018-10-10 – 2018-10-12 (×3): 0.25 mg via RESPIRATORY_TRACT
  Filled 2018-10-10 (×4): qty 2

## 2018-10-10 MED ORDER — DILTIAZEM HCL 25 MG/5ML IV SOLN
10.0000 mg | Freq: Once | INTRAVENOUS | Status: AC
  Administered 2018-10-10: 10 mg via INTRAVENOUS
  Filled 2018-10-10: qty 5

## 2018-10-10 MED ORDER — ONDANSETRON HCL 4 MG/2ML IJ SOLN: 4.0000 mg | Freq: Four times a day (QID) | INTRAMUSCULAR | Status: DC | PRN

## 2018-10-10 MED ORDER — LEVOTHYROXINE SODIUM 25 MCG PO TABS
25.0000 ug | ORAL_TABLET | Freq: Every day | ORAL | Status: DC
  Administered 2018-10-11 – 2018-10-12 (×2): 25 ug via ORAL
  Filled 2018-10-10 (×2): qty 1

## 2018-10-10 MED ORDER — FLUTICASONE PROPIONATE 50 MCG/ACT NA SUSP
1.0000 | Freq: Every day | NASAL | Status: DC
  Administered 2018-10-11 – 2018-10-12 (×2): 1 via NASAL
  Filled 2018-10-10: qty 16

## 2018-10-10 MED ORDER — DENOSUMAB 60 MG/ML ~~LOC~~ SOSY: 60.0000 mg | PREFILLED_SYRINGE | SUBCUTANEOUS | Status: DC

## 2018-10-10 MED ORDER — FREESTYLE FREEDOM LITE W/DEVICE KIT: 1.0000 | PACK | Freq: Every day | Status: DC

## 2018-10-10 MED ORDER — MONTELUKAST SODIUM 10 MG PO TABS
10.0000 mg | ORAL_TABLET | Freq: Every day | ORAL | Status: DC
  Administered 2018-10-10 – 2018-10-11 (×2): 10 mg via ORAL
  Filled 2018-10-10 (×2): qty 1

## 2018-10-10 MED ORDER — METOPROLOL TARTRATE 50 MG PO TABS
150.0000 mg | ORAL_TABLET | Freq: Two times a day (BID) | ORAL | Status: DC
  Administered 2018-10-10 – 2018-10-12 (×4): 150 mg via ORAL
  Filled 2018-10-10 (×4): qty 3

## 2018-10-10 MED ORDER — ASPIRIN EC 81 MG PO TBEC
81.0000 mg | DELAYED_RELEASE_TABLET | Freq: Every day | ORAL | Status: DC
  Administered 2018-10-10 – 2018-10-11 (×2): 81 mg via ORAL
  Filled 2018-10-10 (×2): qty 1

## 2018-10-10 MED ORDER — SIMVASTATIN 20 MG PO TABS
20.0000 mg | ORAL_TABLET | Freq: Every day | ORAL | Status: DC
  Administered 2018-10-10: 20 mg via ORAL
  Filled 2018-10-10: qty 1

## 2018-10-10 MED ORDER — METOPROLOL TARTRATE 5 MG/5ML IV SOLN
INTRAVENOUS | Status: AC
  Administered 2018-10-10: 5 mg via INTRAVENOUS
  Filled 2018-10-10: qty 5

## 2018-10-10 MED ORDER — FLUTICASONE PROPIONATE HFA 110 MCG/ACT IN AERO
2.0000 | INHALATION_SPRAY | Freq: Two times a day (BID) | RESPIRATORY_TRACT | Status: DC
  Filled 2018-10-10: qty 12

## 2018-10-10 MED ORDER — SODIUM CHLORIDE 0.9% FLUSH
3.0000 mL | Freq: Once | INTRAVENOUS | Status: AC
  Administered 2018-10-10: 3 mL via INTRAVENOUS

## 2018-10-10 MED ORDER — ENOXAPARIN SODIUM 40 MG/0.4ML ~~LOC~~ SOLN
40.0000 mg | SUBCUTANEOUS | Status: DC
  Administered 2018-10-10: 40 mg via SUBCUTANEOUS
  Filled 2018-10-10: qty 0.4

## 2018-10-10 MED ORDER — MAGNESIUM SULFATE 2 GM/50ML IV SOLN
2.0000 g | Freq: Once | INTRAVENOUS | Status: AC
  Administered 2018-10-10: 2 g via INTRAVENOUS
  Filled 2018-10-10: qty 50

## 2018-10-10 MED ORDER — IRBESARTAN 75 MG PO TABS
37.5000 mg | ORAL_TABLET | Freq: Every day | ORAL | Status: DC
  Administered 2018-10-10 – 2018-10-12 (×3): 37.5 mg via ORAL
  Filled 2018-10-10 (×3): qty 0.5

## 2018-10-10 MED ORDER — IPRATROPIUM-ALBUTEROL 0.5-2.5 (3) MG/3ML IN SOLN: 3.0000 mL | Freq: Four times a day (QID) | RESPIRATORY_TRACT | Status: DC | PRN

## 2018-10-10 NOTE — ED Notes (Addendum)
Called for report, unable to take report at this time. States will be 1 hour to be able to take report due to number of admissions to floor.

## 2018-10-10 NOTE — ED Notes (Signed)
Daughter updated by phone message with current condition and plan.

## 2018-10-10 NOTE — ED Notes (Signed)
Patient assisted to bedpan. HR noted to 150 with movment in bed.

## 2018-10-10 NOTE — ED Notes (Signed)
Daughter contacted and updated on plan for admission. Patient able to speak with daughter.

## 2018-10-10 NOTE — ED Triage Notes (Signed)
Dizziness when standing this am. history of a fib. RVR en ropute. Metoprolol 2.5 given IV x 2 en route by EMS. Denies chest pain.

## 2018-10-10 NOTE — ED Provider Notes (Signed)
[facility] Emergency Department Provider Note   ____________________________________________   First MD Initiated Contact with Patient 10/10/18 1042     (approximate)  I have reviewed the triage vital signs and the nursing notes.   HISTORY  Chief Complaint Weakness    HPI Ashley Weiss is a 85 y.o. female who got up this morning and felt woozy and had a pounding headache.  These of the symptoms she always has when she has her atrial fibrillation run away from her.  Daughter was present called EMS.  EMS reports patient in A. fib with some higher blood pressure.  They gave HER-2 doses of metoprolol which briefly dropped her blood pressure and heart rate and rebounded.  On arrival in the emergency room patient has a blood pressure 147 her heart rate of 146.  She is running atrial fibrillation with RVR.  Patient has no chest pain chest tightness shortness of breath or any other complaints.  She says as long as she is lying down her headache is not bothering her.  Is not short of breath.        Past Medical History:  Diagnosis Date  . Arthritis   . Asthma   . Cancer ([place])    skin  . Depression   . DVT (deep venous thrombosis) ([place])   . Hyperlipidemia   . Hypertension   . Phlebitis     Patient Active Problem List   Diagnosis Date Noted  . Atrial fibrillation with rapid ventricular response ([place]) 10/10/2018  . Nocturnal hypoxemia due to asthma 10/29/2017  . Hyperlipidemia 07/23/2017  . Osteoporosis, post-menopausal 07/03/2017  . Chronic obstructive pulmonary disease, unspecified ([place]) 05/28/2017  . Pain in left knee 05/28/2017  . Essential (primary) hypertension 05/28/2017  . Cardiac arrhythmia 05/28/2017  . Hypothyroidism 05/28/2017  . Injury of right hip 06/15/2015  . Mild intermittent asthma without complication 60/60/0459    Past Surgical History:  Procedure Laterality Date  . BREAST CYST ASPIRATION Left 20+ yrs ago  . CATARACT  EXTRACTION Bilateral 2005,2009  . cyst removal-right shoulder  1972  . otosclerosis Left 1988   hardening of bone, other 1989 redone  . THYROIDECTOMY  1970   nodule and 1/2 bridge removal  . WRIST SURGERY Left 11/29/2009    Prior to Admission medications   Medication Sig Start Date End Date Taking? Authorizing Provider  albuterol (PROVENTIL HFA;VENTOLIN HFA) 108 (90 Base) MCG/ACT inhaler Inhale 2 puffs into the lungs every 6 (six) hours as needed for wheezing or shortness of breath. 07/08/17   Ronnell Freshwater, NP  aspirin 81 MG tablet Take 81 mg by mouth daily.    [provider]  Blood Glucose Monitoring Suppl (FREESTYLE FREEDOM LITE) w/Device KIT 1 kit by Does not apply route daily. 05/18/18   Kendell Bane, NP  denosumab (PROLIA) 60 MG/ML SOSY injection Inject 60 mg into the skin every 6 (six) months.    [provider]  digoxin (LANOXIN) 0.25 MG tablet Take 1 tablet (0.25 mg total) by mouth daily. Take estra 0.5 tab on Tuesday and Friday. 09/30/18   Kendell Bane, NP  fluticasone (FLONASE) 50 MCG/ACT nasal spray Place 1 spray into both nostrils daily. Use 1-2 sprays daily. 08/05/18   Kendell Bane, NP  fluticasone (FLOVENT HFA) 110 MCG/ACT inhaler Inhale 2 puffs into the lungs 2 (two) times daily. 09/30/18   Kendell Bane, NP  glucose blood (FREESTYLE LITE) test strip Use as directed once a daily  Diag E11.65 05/19/18   Ronnell Freshwater, NP  ipratropium-albuterol (DUONEB) 0.5-2.5 (3) MG/3ML SOLN Take 3 mLs by nebulization 3 (three) times daily as needed. Use 1 vial 3 times daily as needed for SOB dia j45.1 09/30/18 12/29/18  Kendell Bane, NP  Lancets (FREESTYLE) lancets Use as directed once daily diag e11.65 05/19/18   Ronnell Freshwater, NP  levothyroxine (SYNTHROID, LEVOTHROID) 25 MCG tablet TAKE 1 DAILY BY MOUTH IN THE MORNING ON AN EMPTY STOMACH 06/24/18   Kendell Bane, NP  metoprolol tartrate (LOPRESSOR) 100 MG tablet Take 1.5 tablets twice daily. 09/30/18    Kendell Bane, NP  mometasone (ELOCON) 0.1 % ointment Apply topically.    [provider]  montelukast (SINGULAIR) 10 MG tablet TAKE 1 TABLET BY MOUTH EVERY DAY FOR COUGH 06/18/18   Ronnell Freshwater, NP  olmesartan (BENICAR) 40 MG tablet Take 1 tab po daily 09/30/18   Kendell Bane, NP  OXYGEN Inhale 2 L into the lungs every evening. Sleep with nasal cannula w/ O2 at 2lpm.    [provider]  sertraline (ZOLOFT) 100 MG tablet Take 1 tablet (100 mg total) by mouth daily. 03/23/18   Ronnell Freshwater, NP  simvastatin (ZOCOR) 20 MG tablet Take 1 tablet (20 mg total) by mouth at bedtime. 09/30/18   Kendell Bane, NP    Allergies Biaxin [clarithromycin], Ciprofloxacin, Hydralazine, and Meloxicam  Family History  Problem Relation Age of Onset  . Osteoarthritis Mother   . Depression Father   . Breast cancer Neg Hx     Social History Social History   Tobacco Use  . Smoking status: Never Smoker  . Smokeless tobacco: Never Used  Substance Use Topics  . Alcohol use: No  . Drug use: No    Review of Systems  Constitutional: No fever/chills Eyes: No visual changes. ENT: No sore throat. Cardiovascular: Denies chest pain. Respiratory: Denies shortness of breath. Gastrointestinal: No abdominal pain.  No nausea, no vomiting.  No diarrhea.  No constipation. Genitourinary: Negative for dysuria. Musculoskeletal: Negative for back pain. Skin: Negative for rash. Neurological: Currently negative for headaches, focal weakness   ____________________________________________   PHYSICAL EXAM:  VITAL SIGNS: ED Triage Vitals  Enc Vitals Group     BP 10/10/18 1048 (!) 147/101     Pulse Rate 10/10/18 1048 (!) 151     Resp 10/10/18 1048 20     Temp 10/10/18 1048 98.1 F (36.7 C)     Temp Source 10/10/18 1048 Oral     SpO2 10/10/18 1048 98 %     Weight --      Height --      Head Circumference --      Peak Flow --      Pain Score 10/10/18 1051 0     Pain Loc --       Pain Edu? --      Excl. in [place]? --     Constitutional: Alert and oriented. Well appearing and in no acute distress. Eyes: Conjunctivae are normal.  Head: Atraumatic. Nose: No congestion/rhinnorhea. Mouth/Throat: Mucous membranes are moist.  Oropharynx non-erythematous. Neck: No stridor.   Cardiovascular: Rapid rate, irregular rhythm. Grossly normal heart sounds.  Good peripheral circulation. Respiratory: Normal respiratory effort.  No retractions. Lungs CTAB. Gastrointestinal: Soft and nontender. No distention. No abdominal bruits. No CVA tenderness. Musculoskeletal: No lower extremity tenderness nor edema.  . Neurologic:  Normal speech and language. No gross focal neurologic deficits are appreciated.  Skin:  Skin is warm, dry and intact. No rash noted. Psychiatric: Mood and affect are normal. Speech and behavior are normal.  ____________________________________________   LABS (all labs ordered are listed, but only abnormal results are displayed)  Labs Reviewed  TROPONIN I - Abnormal; Notable for the following components:      Result Value   Troponin I 0.04 (*)    All other components within normal limits  COMPREHENSIVE METABOLIC PANEL - Abnormal; Notable for the following components:   Glucose, Bld 176 (*)    Calcium 8.8 (*)    Albumin 3.2 (*)    All other components within normal limits  DIGOXIN LEVEL - Abnormal; Notable for the following components:   Digoxin Level 0.7 (*)    All other components within normal limits  TROPONIN I - Abnormal; Notable for the following components:   Troponin I 0.03 (*)    All other components within normal limits  SARS CORONAVIRUS 2 (HOSPITAL ORDER, [place] [facility])  CBC   ____________________________________________  EKG  EKG read interpreted by me shows A. fib with RVR at a rate of 146 normal axis nonspecific ST-T wave changes ____________________________________________  RADIOLOGY  ED MD interpretation:  This x-ray read by radiology reviewed by me shows a large heart but no acute problems  Official radiology report(s): Dg Chest Portable 1 View  Result Date: 10/10/2018 CLINICAL DATA:  Dizziness when standing. EXAM: PORTABLE CHEST 1 VIEW COMPARISON:  08/21/2015 FINDINGS: Cardiomediastinal silhouette is mildly enlarged. Calcific atherosclerotic disease of the aorta. There is no evidence of focal airspace consolidation, pleural effusion or pneumothorax. Osseous structures are without acute abnormality. Soft tissues are grossly normal. IMPRESSION: 1. Mildly enlarged cardiac silhouette. 2. Calcific atherosclerotic disease of the aorta. Electronically Signed   By: Fidela Salisbury M.D.   On: 10/10/2018 11:54    ____________________________________________   PROCEDURES  Procedure(s) performed (including Critical Care): Critical care time 45 minutes.  This includes observing the patient and evaluating her during her 2 doses of metoprolol and her IV dig and then seeing how she did with the diltiazem which finally did slow down her heart rate  Procedures   ____________________________________________   INITIAL IMPRESSION / ASSESSMENT AND PLAN / ED COURSE  Ashley Weiss was evaluated in Emergency Department on 10/10/2018 for the symptoms described in the history of present illness. She was evaluated in the context of the global COVID-19 pandemic, which necessitated consideration that the patient might be at risk for infection with the SARS-CoV-2 virus that causes COVID-19. Institutional protocols and algorithms that pertain to the evaluation of patients at risk for COVID-19 are in a state of rapid change based on information released by regulatory bodies including the CDC and federal and state organizations. These policies and algorithms were followed during the patient's care in the ED.              ____________________________________________   FINAL CLINICAL IMPRESSION(S) / ED  DIAGNOSES  Final diagnoses:  Atrial fibrillation with RVR [facility])     ED Discharge Orders    None       Note:  This document was prepared using Dragon voice recognition software and may include unintentional dictation errors.    Nena Polio, MD 10/10/18 [phone]

## 2018-10-10 NOTE — ED Notes (Signed)
Nurse unable to take report due to patient care. Will return call in 15 minutes.

## 2018-10-10 NOTE — ED Notes (Signed)
ED TO INPATIENT HANDOFF REPORT  ED Nurse Name and Phone #: [phone]  S Name/Age/Gender Ashley Weiss 85 y.o. female Room/Bed: ED03A/ED03A  Code Status   Code Status: DNR  Home/SNF/Other Home Patient oriented to: situation Is this baseline? Yes   Triage Complete: Triage complete  Chief Complaint afib  Triage Note Dizziness when standing this am. history of a fib. RVR en ropute. Metoprolol 2.5 given IV x 2 en route by EMS. Denies chest pain.    Allergies Allergies  Allergen Reactions  . Hydralazine Itching and Swelling  . Biaxin [Clarithromycin] Nausea And Vomiting  . Ciprofloxacin Other (See Comments)    Hand cramping   . Meloxicam   . Minocycline     Level of Care/Admitting Diagnosis ED Disposition    ED Disposition Condition [facility] Area: [place] [100120]  Level of Care: Telemetry [5]  Covid Evaluation: N/A  Diagnosis: Atrial fibrillation with RVR [facility]) [[id number]]  Admitting Physician: Loletha Grayer [[id number]]  Attending Physician: Loletha Grayer [phone]  PT Class (Do Not Modify): Observation [104]  PT Acc Code (Do Not Modify): Observation [10022]       B Medical/Surgery History Past Medical History:  Diagnosis Date  . Arthritis   . Asthma   . Cancer ([place])    skin  . Depression   . DVT (deep venous thrombosis) ([place])   . Hyperlipidemia   . Hypertension   . Phlebitis    Past Surgical History:  Procedure Laterality Date  . BREAST CYST ASPIRATION Left 20+ yrs ago  . CATARACT EXTRACTION Bilateral 2005,2009  . cyst removal-right shoulder  1972  . otosclerosis Left 1988   hardening of bone, other 1989 redone  . THYROIDECTOMY  1970   nodule and 1/2 bridge removal  . WRIST SURGERY Left 11/29/2009     A IV Location/Drains/Wounds Patient Lines/Drains/Airways Status   Active Line/Drains/Airways    Name:   Placement date:   Placement time:   Site:   Days:   Peripheral IV 10/10/18 Left Antecubital    10/10/18    -    Antecubital   less than 1   Peripheral IV 10/10/18 Right Antecubital   10/10/18    1559    Antecubital   less than 1          Intake/Output Last 24 hours No intake or output data in the 24 hours ending 10/10/18 1711  Labs/Imaging Results for orders placed or performed during the hospital encounter of 10/10/18 (from the past 48 hour(s))  CBC     Status: None   Collection Time: 10/10/18 10:58 AM  Result Value Ref Range   WBC 7.7 4.0 - 10.5 K/uL   RBC 4.19 3.87 - 5.11 MIL/uL   Hemoglobin 13.3 12.0 - 15.0 g/dL   HCT 41.2 36.0 - 46.0 %   MCV 98.3 80.0 - 100.0 fL   MCH 31.7 26.0 - 34.0 pg   MCHC 32.3 30.0 - 36.0 g/dL   RDW 13.7 11.5 - 15.5 %   Platelets 213 150 - 400 K/uL   nRBC 0.0 0.0 - 0.2 %    Comment: Performed at [facility], [address] [place], [place] [address]  Troponin I - ONCE - STAT     Status: Abnormal   Collection Time: 10/10/18 10:58 AM  Result Value Ref Range   Troponin I 0.04 (HH) <0.03 ng/mL    Comment: CRITICAL RESULT CALLED TO, READ BACK BY AND VERIFIED WITH KIM  Brownie Gockel @1254  10/10/18 AKT Performed at [facility], [address] [place], [place] [address]   Comprehensive metabolic panel     Status: Abnormal   Collection Time: 10/10/18 10:58 AM  Result Value Ref Range   Sodium 138 135 - 145 mmol/L   Potassium 4.3 3.5 - 5.1 mmol/L   Chloride 99 98 - 111 mmol/L   CO2 31 22 - 32 mmol/L   Glucose, Bld 176 (H) 70 - 99 mg/dL   BUN 17 8 - 23 mg/dL   Creatinine, Ser 0.48 0.44 - 1.00 mg/dL   Calcium 8.8 (L) 8.9 - 10.3 mg/dL   Total Protein 7.2 6.5 - 8.1 g/dL   Albumin 3.2 (L) 3.5 - 5.0 g/dL   AST 20 15 - 41 U/L   ALT 21 0 - 44 U/L   Alkaline Phosphatase 52 38 - 126 U/L   Total Bilirubin 0.7 0.3 - 1.2 mg/dL   GFR calc non Af Amer >60 >60 mL/min   GFR calc Af Amer >60 >60 mL/min   Anion gap 8 5 - 15    Comment: Performed at [facility], [address] [place], [place] [address]  Digoxin level     Status:  Abnormal   Collection Time: 10/10/18 11:13 AM  Result Value Ref Range   Digoxin Level 0.7 (L) 0.8 - 2.0 ng/mL    Comment: Performed at [facility], [address] [place], [place] [address]  Troponin I - ONCE - STAT     Status: Abnormal   Collection Time: 10/10/18  1:24 PM  Result Value Ref Range   Troponin I 0.03 (HH) <0.03 ng/mL    Comment: CRITICAL VALUE NOTED. VALUE IS CONSISTENT WITH PREVIOUSLY REPORTED/CALLED VALUE.QSD Performed at [facility], [address] [place], [place] [address]   SARS Coronavirus 2 (CEPHEID - Performed in [facility] hospital lab), Hosp Order     Status: None   Collection Time: 10/10/18  2:49 PM   Specimen: Nasopharyngeal Swab  Result Value Ref Range   SARS Coronavirus 2 NEGATIVE NEGATIVE    Comment: (NOTE) If result is NEGATIVE SARS-CoV-2 target nucleic acids are NOT DETECTED. The SARS-CoV-2 RNA is generally detectable in upper and lower  respiratory specimens during the acute phase of infection. The lowest  concentration of SARS-CoV-2 viral copies this assay can detect is 250  copies / mL. A negative result does not preclude SARS-CoV-2 infection  and should not be used as the sole basis for treatment or other  patient management decisions.  A negative result may occur with  improper specimen collection / handling, submission of specimen other  than nasopharyngeal swab, presence of viral mutation(s) within the  areas targeted by this assay, and inadequate number of viral copies  (<250 copies / mL). A negative result must be combined with clinical  observations, patient history, and epidemiological information. If result is POSITIVE SARS-CoV-2 target nucleic acids are DETECTED. The SARS-CoV-2 RNA is generally detectable in upper and lower  respiratory specimens dur ing the acute phase of infection.  Positive  results are indicative of active infection with SARS-CoV-2.  Clinical  correlation with patient history and other  diagnostic information is  necessary to determine patient infection status.  Positive results do  not rule out bacterial infection or co-infection with other viruses. If result is PRESUMPTIVE POSTIVE SARS-CoV-2 nucleic acids MAY BE PRESENT.   A presumptive positive result was obtained on the submitted specimen  and confirmed on repeat testing.  While 2019 novel coronavirus  (SARS-CoV-2) nucleic acids may be present in the submitted sample  additional confirmatory testing may be necessary for epidemiological  and / or clinical management purposes  to differentiate between  SARS-CoV-2 and other Sarbecovirus currently known to infect humans.  If clinically indicated additional testing with an alternate test  methodology [phone]) is advised. The SARS-CoV-2 RNA is generally  detectable in upper and lower respiratory sp ecimens during the acute  phase of infection. The expected result is Negative. Fact Sheet for Patients:  StrictlyIdeas.no Fact Sheet for Healthcare Providers: BankingDealers.co.za This test is not yet approved or cleared by the [place] FDA and has been authorized for detection and/or diagnosis of SARS-CoV-2 by FDA under an Emergency Use Authorization (EUA).  This EUA will remain in effect (meaning this test can be used) for the duration of the COVID-19 declaration under Section 564(b)(1) of the Act, 21 U.S.C. section 360bbb-3(b)(1), unless the authorization is terminated or revoked sooner. Performed at [facility], [address] [place], [place] [address]    Dg Chest Portable 1 View  Result Date: 10/10/2018 CLINICAL DATA:  Dizziness when standing. EXAM: PORTABLE CHEST 1 VIEW COMPARISON:  08/21/2015 FINDINGS: Cardiomediastinal silhouette is mildly enlarged. Calcific atherosclerotic disease of the aorta. There is no evidence of focal airspace consolidation, pleural effusion or pneumothorax. Osseous structures  are without acute abnormality. Soft tissues are grossly normal. IMPRESSION: 1. Mildly enlarged cardiac silhouette. 2. Calcific atherosclerotic disease of the aorta. Electronically Signed   By: Fidela Salisbury M.D.   On: 10/10/2018 11:54    Pending Labs Unresulted Labs (From admission, onward)   None      Vitals/Pain Today's Vitals   10/10/18 1430 10/10/18 1536 10/10/18 1539 10/10/18 1626  BP: (!) 147/102 (!) 143/84 133/74 136/74  Pulse: (!) 148 90 74 75  Resp: 20 18 18 18   Temp:      TempSrc:      SpO2: 95% 94% 95% 96%  PainSc: 0-No pain 0-No pain 0-No pain 0-No pain    Isolation Precautions No active isolations  Medications Medications  digoxin (LANOXIN) tablet 0.25 mg (has no administration in time range)  irbesartan (AVAPRO) tablet 37.5 mg (has no administration in time range)  simvastatin (ZOCOR) tablet 20 mg (has no administration in time range)  sertraline (ZOLOFT) tablet 100 mg (has no administration in time range)  levothyroxine (SYNTHROID) tablet 25 mcg (has no administration in time range)  fluticasone (FLONASE) 50 MCG/ACT nasal spray 1 spray (has no administration in time range)  fluticasone (FLOVENT HFA) 110 MCG/ACT inhaler 2 puff (has no administration in time range)  ipratropium-albuterol (DUONEB) 0.5-2.5 (3) MG/3ML nebulizer solution 3 mL (has no administration in time range)  montelukast (SINGULAIR) tablet 10 mg (has no administration in time range)  metoprolol tartrate (LOPRESSOR) tablet 150 mg (has no administration in time range)  aspirin EC tablet 81 mg (has no administration in time range)  acetaminophen (TYLENOL) tablet 650 mg (has no administration in time range)  ondansetron (ZOFRAN) injection 4 mg (has no administration in time range)  enoxaparin (LOVENOX) injection 40 mg (has no administration in time range)  magnesium sulfate IVPB 2 g 50 mL (has no administration in time range)  metoprolol tartrate (LOPRESSOR) injection 5 mg (has no  administration in time range)  sodium chloride flush (NS) 0.9 % injection 3 mL (3 mLs Intravenous Given 10/10/18 1122)  metoprolol tartrate (LOPRESSOR) injection 5 mg (5 mg Intravenous Given 10/10/18 1121)  metoprolol tartrate (LOPRESSOR) injection  5 mg (5 mg Intravenous Given 10/10/18 1305)  digoxin (LANOXIN) 0.25 MG/ML injection 0.1 mg (0.1 mg Intravenous Given 10/10/18 1438)  diltiazem (CARDIZEM) injection 10 mg (10 mg Intravenous Given 10/10/18 1526)    Mobility walks with device Low fall risk   Focused Assessments Cardiac Assessment Handoff:    Lab Results  Component Value Date   TROPONINI 0.03 ([place]) 10/10/2018   No results found for: DDIMER Does the Patient currently have chest pain? No     R Recommendations: See Admitting Provider Note  Report given to:   Additional Notes: a fib rate 70s cardiazem drip off

## 2018-10-10 NOTE — ED Notes (Signed)
Report given, pt to room 237.

## 2018-10-10 NOTE — H&P (Addendum)
[facility] [place] NAME: Ashley Weiss    MR#:  [id number]  DATE OF BIRTH:  [birth year]  DATE OF ADMISSION:  10/10/2018  PRIMARY CARE PHYSICIAN: Lavera Guise, MD   REQUESTING/REFERRING PHYSICIAN: Conni Slipper, MD  CHIEF COMPLAINT:   Chief Complaint  Patient presents with  . Weakness    HISTORY OF PRESENT ILLNESS:  85 y.o. female with pertinent past medical history of DVT, hyperlipidemia, diet-controlled diabetes, hypertension, depression, melanoma, atrial fibrillation and arthritis presenting to the ED with chief complaints of dizziness.  Patient reports she was trying to stand up this morning and felt very lightheaded with near fall.  Patient was recently evaluated by her PCP 2 days ago for fatigue, bilateral leg weakness, hypertension and elevated heart rate. Denies associated altered sensorium, speech abnormality, cranial nerve deficit, seizures, focal motor or sensory deficits, diplopia, nausea or vomiting, syncope or LOC, headache paresthesia (numbness, tingling, pins-and-needles sensation) or a heavy feeling in an extremity.   On arrival to the ED, She was afebrile with blood pressure 147/101 mm Hg and pulse rate 1 beats/min. There were no focal neurological deficits; he was alert and oriented x4, and he did not demonstrate any memory deficits. ECG showed atrial fibrillation with RVR 146 beats per minute and the chest X-ray was normal.  Patient received metoprolol IV x2 and digoxin in the ED.  Initial labs revealed TSH 4.6, digoxin level 0.7, troponin 0.04 now improved to 0.03, unremarkable CBC and CMP.  Patient was loaded with Diltiazem 0.'25mg'$ /kg ('15mg'$ ) IV x1 then placed on continuous dose 5-'15mg'$ /hr drip.  She will be admitted to hospitalist service for further monitoring and management.  PAST MEDICAL HISTORY:   Past Medical History:  Diagnosis Date  . Arthritis   . Asthma   . Cancer ([place])    skin  . Depression   . DVT (deep  venous thrombosis) ([place])   . Hyperlipidemia   . Hypertension   . Phlebitis     PAST SURGICAL HISTORY:   Past Surgical History:  Procedure Laterality Date  . BREAST CYST ASPIRATION Left 20+ yrs ago  . CATARACT EXTRACTION Bilateral 2005,2009  . cyst removal-right shoulder  1972  . otosclerosis Left 1988   hardening of bone, other 1989 redone  . THYROIDECTOMY  1970   nodule and 1/2 bridge removal  . WRIST SURGERY Left 11/29/2009    SOCIAL HISTORY:   Social History   Tobacco Use  . Smoking status: Never Smoker  . Smokeless tobacco: Never Used  Substance Use Topics  . Alcohol use: No    FAMILY HISTORY:   Family History  Problem Relation Age of Onset  . Osteoarthritis Mother   . Depression Father   . Breast cancer Neg Hx     DRUG ALLERGIES:   Allergies  Allergen Reactions  . Biaxin [Clarithromycin] Nausea And Vomiting  . Ciprofloxacin Other (See Comments)    Hand cramping   . Hydralazine Itching and Swelling  . Meloxicam     REVIEW OF SYSTEMS:   Review of Systems  Constitutional: Negative for chills, fever, malaise/fatigue and weight loss.  HENT: Positive for hearing loss. Negative for congestion and sore throat.   Eyes: Negative for blurred vision and double vision.  Respiratory: Negative for cough, shortness of breath and wheezing.   Cardiovascular: Negative for chest pain, palpitations, orthopnea and leg swelling.  Gastrointestinal: Negative for abdominal pain, diarrhea, nausea and vomiting.  Genitourinary: Negative for dysuria and urgency.  Musculoskeletal: Negative for myalgias.  Skin: Negative for rash.  Neurological: Positive for dizziness. Negative for sensory change, speech change, focal weakness and headaches.  Psychiatric/Behavioral: Positive for depression.   MEDICATIONS AT HOME:   Prior to Admission medications   Medication Sig Start Date End Date Taking? Authorizing Provider  albuterol (PROVENTIL HFA;VENTOLIN HFA) 108 (90 Base) MCG/ACT  inhaler Inhale 2 puffs into the lungs every 6 (six) hours as needed for wheezing or shortness of breath. 07/08/17   Ronnell Freshwater, NP  aspirin 81 MG tablet Take 81 mg by mouth daily.    [provider]  Blood Glucose Monitoring Suppl (FREESTYLE FREEDOM LITE) w/Device KIT 1 kit by Does not apply route daily. 05/18/18   Kendell Bane, NP  denosumab (PROLIA) 60 MG/ML SOSY injection Inject 60 mg into the skin every 6 (six) months.    [provider]  digoxin (LANOXIN) 0.25 MG tablet Take 1 tablet (0.25 mg total) by mouth daily. Take estra 0.5 tab on Tuesday and Friday. 09/30/18   Kendell Bane, NP  fluticasone (FLONASE) 50 MCG/ACT nasal spray Place 1 spray into both nostrils daily. Use 1-2 sprays daily. 08/05/18   Kendell Bane, NP  fluticasone (FLOVENT HFA) 110 MCG/ACT inhaler Inhale 2 puffs into the lungs 2 (two) times daily. 09/30/18   Kendell Bane, NP  glucose blood (FREESTYLE LITE) test strip Use as directed once a daily Diag E11.65 05/19/18   Ronnell Freshwater, NP  ipratropium-albuterol (DUONEB) 0.5-2.5 (3) MG/3ML SOLN Take 3 mLs by nebulization 3 (three) times daily as needed. Use 1 vial 3 times daily as needed for SOB dia j45.1 09/30/18 12/29/18  Kendell Bane, NP  Lancets (FREESTYLE) lancets Use as directed once daily diag e11.65 05/19/18   Ronnell Freshwater, NP  levothyroxine (SYNTHROID, LEVOTHROID) 25 MCG tablet TAKE 1 DAILY BY MOUTH IN THE MORNING ON AN EMPTY STOMACH 06/24/18   Kendell Bane, NP  metoprolol tartrate (LOPRESSOR) 100 MG tablet Take 1.5 tablets twice daily. 09/30/18   Kendell Bane, NP  mometasone (ELOCON) 0.1 % ointment Apply topically.    [provider]  montelukast (SINGULAIR) 10 MG tablet TAKE 1 TABLET BY MOUTH EVERY DAY FOR COUGH 06/18/18   Ronnell Freshwater, NP  olmesartan (BENICAR) 40 MG tablet Take 1 tab po daily 09/30/18   Kendell Bane, NP  OXYGEN Inhale 2 L into the lungs every evening. Sleep with nasal cannula w/ O2 at 2lpm.     [provider]  sertraline (ZOLOFT) 100 MG tablet Take 1 tablet (100 mg total) by mouth daily. 03/23/18   Ronnell Freshwater, NP  simvastatin (ZOCOR) 20 MG tablet Take 1 tablet (20 mg total) by mouth at bedtime. 09/30/18   Kendell Bane, NP      VITAL SIGNS:  Blood pressure 133/74, pulse 74, temperature 98.1 F (36.7 C), temperature source Oral, resp. rate 18, SpO2 95 %.  PHYSICAL EXAMINATION:   Physical Exam  GENERAL:  85 y.o.-year-old patient lying in the bed with no acute distress.  EYES: Pupils equal, round, reactive to light and accommodation. No scleral icterus. Extraocular muscles intact.  HEENT: Head atraumatic, normocephalic. Oropharynx and nasopharynx clear.  NECK:  Supple, no jugular venous distention. No thyroid enlargement, no tenderness.  LUNGS: Normal breath sounds bilaterally, no wheezing, rales,rhonchi or crepitation. No use of accessory muscles of respiration.  CARDIOVASCULAR: S1, S2 normal. No murmurs, rubs, or gallops.  ABDOMEN: Soft, nontender, nondistended. Bowel sounds present. No  organomegaly or mass.  EXTREMITIES: No pedal edema, cyanosis, or clubbing. No rash or lesions. + pedal pulses MUSCULOSKELETAL: Normal bulk, and power was 5+ grip and elbow, knee, and ankle flexion and extension bilaterally.  NEUROLOGIC:Alert and oriented x 3. CN 2-12 intact. Sensation to light touch and cold stimuli intact bilaterally. Finger to nose nl. Babinski is downgoing. DTR's (biceps, patellar, and achilles) 2+ and symmetric throughout. Gait not tested due to safety concern. PSYCHIATRIC: The patient is alert and oriented x 3.  SKIN: No obvious rash, lesion, or ulcer.   DATA REVIEWED:  LABORATORY PANEL:   CBC Recent Labs  Lab 10/10/18 1058  WBC 7.7  HGB 13.3  HCT 41.2  PLT 213   ------------------------------------------------------------------------------------------------------------------  Chemistries  Recent Labs  Lab 10/10/18 1058  NA 138  K 4.3   CL 99  CO2 31  GLUCOSE 176*  BUN 17  CREATININE 0.48  CALCIUM 8.8*  AST 20  ALT 21  ALKPHOS 52  BILITOT 0.7   ------------------------------------------------------------------------------------------------------------------  Cardiac Enzymes Recent Labs  Lab 10/10/18 1324  TROPONINI 0.03*   ------------------------------------------------------------------------------------------------------------------  RADIOLOGY:  Dg Chest Portable 1 View  Result Date: 10/10/2018 CLINICAL DATA:  Dizziness when standing. EXAM: PORTABLE CHEST 1 VIEW COMPARISON:  08/21/2015 FINDINGS: Cardiomediastinal silhouette is mildly enlarged. Calcific atherosclerotic disease of the aorta. There is no evidence of focal airspace consolidation, pleural effusion or pneumothorax. Osseous structures are without acute abnormality. Soft tissues are grossly normal. IMPRESSION: 1. Mildly enlarged cardiac silhouette. 2. Calcific atherosclerotic disease of the aorta. Electronically Signed   By: Fidela Salisbury M.D.   On: 10/10/2018 11:54   EKG:  EKG: atrial fibrillation, rate 146. Vent. rate 146 BPM PR interval * ms QRS duration 71 ms QT/QTc 330/515 ms P-R-T axes * 35 259 IMPRESSION AND PLAN:   85 y.o. female with pertinent past medical history of DVT, hyperlipidemia, diet-controlled diabetes, hypertension, depression, melanoma, atrial fibrillation and arthritis presenting to the ED with chief complaints of dizziness.  Atrial Fibrillation with RVR - patient with history of atrial fibrillation - Admit to telemetry unit - EKG reviewed and shows atrial fibrillation with RVR rate of 146 bpm - Troponins slightly elevated - TSH slightly elevated 4.6, FT4 1.22 - UA pending - CXR shows no active cardiopulmonary process - TTEcho pending - Diltiazem 0.'25mg'$ /kg ('15mg'$ ) IV x1 then maintain 5-'15mg'$ /hr drip - Continue home metoprolol '150mg'$  PO bid  - Continue home digoxin, recent digoxin level 0.7 - Cardiology Consult  placed and sent via [facility] to Dr. Humphrey Rolls  2.  Elevated troponin-likely demand ischemia - Continue to trend troponins  3. HLD  + Goal LDL<100 - Simvastatin '20mg'$  PO qhs  4. HTN  + Goal BP <140/90 - Beta-blocker: Metoprolol as above - ACE-Inhibitor: Continue Irbesartan  5. DM = Diet controlled  6. Hypothyroidism -Continue Synthroid  7. Thromboembolism Risk Management -Lovenox subcu  8.  Depression-continue sertraline 100 mg daily   All the records are reviewed and case discussed with ED provider. Management plans discussed with the patient, family and they are in agreement.  CODE STATUS: DNR  TOTAL TIME TAKING CARE OF THIS PATIENT: 40 minutes.    on 10/10/2018 at 3:48 PM  This patient was staffed with Dr. Leslye Peer, Delfino Lovett who personally evaluated patient, reviewed documentation and agreed with assessment and plan of care as above.  Rufina Falco, DNP, FNP-BC [facility] Nurse Practitioner Between 7am to 6pm - Pager [phone]  After 6pm go to [email or web address] - [facility]  Office  [phone]  CC: Primary care physician; Lavera Guise, MD

## 2018-10-10 NOTE — Progress Notes (Signed)
Received pt to room 237 at 1830.  Pt oriented to new surroundings.  Placed on cardiac telemetry monitor.  VS obtained.  Bed low and locked with bed alarm on for safety.

## 2018-10-11 DIAGNOSIS — E119 Type 2 diabetes mellitus without complications: Secondary | ICD-10-CM | POA: Diagnosis not present

## 2018-10-11 DIAGNOSIS — I4891 Unspecified atrial fibrillation: Secondary | ICD-10-CM | POA: Diagnosis not present

## 2018-10-11 DIAGNOSIS — I10 Essential (primary) hypertension: Secondary | ICD-10-CM | POA: Diagnosis not present

## 2018-10-11 DIAGNOSIS — E785 Hyperlipidemia, unspecified: Secondary | ICD-10-CM | POA: Diagnosis not present

## 2018-10-11 MED ORDER — APIXABAN 2.5 MG PO TABS
2.5000 mg | ORAL_TABLET | Freq: Two times a day (BID) | ORAL | Status: DC
  Administered 2018-10-11 – 2018-10-12 (×3): 2.5 mg via ORAL
  Filled 2018-10-11 (×3): qty 1

## 2018-10-11 MED ORDER — ROSUVASTATIN CALCIUM 5 MG PO TABS
5.0000 mg | ORAL_TABLET | Freq: Every day | ORAL | Status: DC
  Administered 2018-10-11: 5 mg via ORAL
  Filled 2018-10-11: qty 1

## 2018-10-11 MED ORDER — DILTIAZEM HCL 30 MG PO TABS
30.0000 mg | ORAL_TABLET | Freq: Four times a day (QID) | ORAL | Status: DC
  Administered 2018-10-11 – 2018-10-12 (×3): 30 mg via ORAL
  Filled 2018-10-11 (×3): qty 1

## 2018-10-11 NOTE — Progress Notes (Signed)
[facility] [place] NAME: Ashley Weiss    MR#:  [id number]  DATE OF BIRTH:  November 07, 1919  SUBJECTIVE:  patient came in with dizziness and weakness found to be in a fib with RVR. She received IV metoprolol and two doses of IV digoxin. Heart rate currently in the 70s however with ambulation heart rate went up in the 140s.  Denies any chest pain or shortness of breath. Eager to go home.  REVIEW OF SYSTEMS:   Review of Systems  Constitutional: Negative for chills, fever and weight loss.  HENT: Negative for ear discharge, ear pain and nosebleeds.   Eyes: Negative for blurred vision, pain and discharge.  Respiratory: Negative for sputum production, shortness of breath, wheezing and stridor.   Cardiovascular: Negative for chest pain, palpitations, orthopnea and PND.  Gastrointestinal: Negative for abdominal pain, diarrhea, nausea and vomiting.  Genitourinary: Negative for frequency and urgency.  Musculoskeletal: Negative for back pain and joint pain.  Neurological: Positive for dizziness and weakness. Negative for sensory change, speech change and focal weakness.  Psychiatric/Behavioral: Negative for depression and hallucinations. The patient is not nervous/anxious.    Tolerating Diet:yes Tolerating PT: not needed  DRUG ALLERGIES:   Allergies  Allergen Reactions  . Hydralazine Itching and Swelling  . Biaxin [Clarithromycin] Nausea And Vomiting  . Ciprofloxacin Other (See Comments)    Hand cramping   . Meloxicam   . Minocycline     VITALS:  Blood pressure (!) 148/89, pulse 77, temperature 98.6 F (37 C), temperature source Oral, resp. rate 14, height 5\' 4"  (1.626 m), weight 56.6 kg, SpO2 95 %.  PHYSICAL EXAMINATION:   Physical Exam  GENERAL:  85 y.o.-year-old patient lying in the bed with no acute distress.  EYES: Pupils equal, round, reactive to light and accommodation. No scleral icterus. Extraocular muscles intact.   HEENT: Head atraumatic, normocephalic. Oropharynx and nasopharynx clear.  NECK:  Supple, no jugular venous distention. No thyroid enlargement, no tenderness.  LUNGS: Normal breath sounds bilaterally, no wheezing, rales, rhonchi. No use of accessory muscles of respiration.  CARDIOVASCULAR: S1, S2 normal. No murmurs, rubs, or gallops. Irregularly irregular ABDOMEN: Soft, nontender, nondistended. Bowel sounds present. No organomegaly or mass.  EXTREMITIES: No cyanosis, clubbing or edema b/l.    NEUROLOGIC: Cranial nerves II through XII are intact. No focal Motor or sensory deficits b/l.   PSYCHIATRIC:  patient is alert and oriented x 3.  SKIN: No obvious rash, lesion, or ulcer.   LABORATORY PANEL:  CBC Recent Labs  Lab 10/10/18 1058  WBC 7.7  HGB 13.3  HCT 41.2  PLT 213    Chemistries  Recent Labs  Lab 10/10/18 1058  NA 138  K 4.3  CL 99  CO2 31  GLUCOSE 176*  BUN 17  CREATININE 0.48  CALCIUM 8.8*  AST 20  ALT 21  ALKPHOS 52  BILITOT 0.7   Cardiac Enzymes Recent Labs  Lab 10/10/18 1324  TROPONINI 0.03*   RADIOLOGY:  Dg Chest Portable 1 View  Result Date: 10/10/2018 CLINICAL DATA:  Dizziness when standing. EXAM: PORTABLE CHEST 1 VIEW COMPARISON:  08/21/2015 FINDINGS: Cardiomediastinal silhouette is mildly enlarged. Calcific atherosclerotic disease of the aorta. There is no evidence of focal airspace consolidation, pleural effusion or pneumothorax. Osseous structures are without acute abnormality. Soft tissues are grossly normal. IMPRESSION: 1. Mildly enlarged cardiac silhouette. 2. Calcific atherosclerotic disease of the aorta. Electronically Signed   By: Fidela Salisbury M.D.   On: 10/10/2018  11:54   ASSESSMENT AND PLAN:   85 y.o. female with pertinent past medical history of DVT, hyperlipidemia, diet-controlled diabetes, hypertension, depression, melanoma, atrial fibrillation and arthritis presenting to the ED with  complaints of dizziness.  1.Atrial  Fibrillation with RVR - patient with history of atrial fibrillation - EKG reviewed and shows atrial fibrillation with RVR rate of 146 bpm - Troponins slightly elevated - TSH slightly elevated 4.6, FT4 1.22 - CXR shows no active cardiopulmonary process - TTEcho pending - Continue home metoprolol 150mg  PO bid  - Continue home digoxin, recent digoxin level 0.7 - Cardiology Consult placed and sent via [facility] to Dr. Alfonse Spruce input -patient's heart rate went up with ambulation. Will add Cardizem 30 mg Q ID. -cardiology recommendations for eliquis noted. No history of G.I. bleed in the past this was confirmed from patient's daughter Ashley Weiss  2.  Elevated troponin-likely demand ischemia due to rapid a fib.  3. HLD  + Goal LDL<100 -patient takes simvastatin 20mg  PO qhs-- however having interaction with Cardizem will switch her to Crestor 5 mg Q HS  4. HTN  + Goal BP <140/90 - Beta-blocker: Metoprolol as above - ACE-Inhibitor: Continue Irbesartan  5. DM = Diet controlled  6. Hypothyroidism -Continue Synthroid  7. Thromboembolism Risk Management -eliquis  8.  Depression-continue sertraline 100 mg daily  spoke with patient's daughter Ashley Weiss on the phone and updated  Case discussed with Care Management/Social Worker. Management plans discussed with the patient, family and they are in agreement.  CODE STATUS: dnr  DVT Prophylaxis: eliquis  TOTAL TIME TAKING CARE OF THIS PATIENT: *30* minutes.  >50% time spent on counselling and coordination of care  POSSIBLE D/C IN *1-2* DAYS, DEPENDING ON CLINICAL CONDITION.  Note: This dictation was prepared with Dragon dictation along with smaller phrase technology. Any transcriptional errors that result from this process are unintentional.  Fritzi Mandes M.D on 10/11/2018 at 1:12 PM  Between 7am to 6pm - Pager - [phone]  After 6pm go to [email or web address] - password [email or web address] [facility]  Office   [phone]  CC: Primary care physician; Ashley Weiss, MDPatient ID: Ashley Weiss, female   DOB: 27-Apr-1919, 85 y.o.   MRN: [id number]

## 2018-10-11 NOTE — Plan of Care (Signed)
  Problem: Education: Goal: Knowledge of General Education information will improve Description Including pain rating scale, medication(s)/side effects and non-pharmacologic comfort measures Outcome: Progressing   Problem: Activity: Goal: Risk for activity intolerance will decrease Outcome: Progressing   Problem: Safety: Goal: Ability to remain free from injury will improve Outcome: Progressing   

## 2018-10-11 NOTE — Care Management Obs Status (Signed)
[facility] NOTIFICATION   Patient Details  Name: Ashley Weiss MRN: [id number] Date of Birth: 1919-10-22   Medicare Observation Status Notification Given:  Yes    Fantasha Daniele A Ocia Simek, RN 10/11/2018, 9:39 AM

## 2018-10-11 NOTE — Plan of Care (Signed)

## 2018-10-11 NOTE — Care Management CC44 (Signed)
Condition Code 44 Documentation Completed  Patient Details  Name: Ashley Weiss MRN: [id number] Date of Birth: 06-27-1919   Condition Code 44 given:  Yes Patient signature on Condition Code 44 notice:  Yes Documentation of 2 MD's agreement:  Yes Code 44 added to claim:  Yes    Latanya Maudlin, RN 10/11/2018, 10:27 AM

## 2018-10-11 NOTE — Consult Note (Signed)
Ashley Weiss is a 85 y.o. female  [id number]  Primary Cardiologist: New patient to Dr. Neoma Laming Reason for Consultation: Atrial fibrillation  HPI: 85 y.o. caucasian female with pertinent past medical history of DVT, hyperlipidemia, diet-controlled diabetes, hypertension, depression, melanoma, atrial fibrillation and arthritis presenting to the ED with chief complaints of dizziness and weakness with elevated HR. EKG in ER noted to be Afib RVR 146bpm. Given IV metoprolol, IV digoxin, and IV diltiazem loading dose and then drip and rate became controlled. She was admitted for further mangement.     Review of Systems: Feeling well, no palpitations, no chest pain, no shortness of breath.   Past Medical History:  Diagnosis Date  . Arthritis   . Asthma   . Cancer ([place])    skin  . Depression   . DVT (deep venous thrombosis) ([place])   . Hyperlipidemia   . Hypertension   . Phlebitis     Medications Prior to Admission  Medication Sig Dispense Refill  . aspirin 81 MG tablet Take 81 mg by mouth daily.    . digoxin (LANOXIN) 0.25 MG tablet Take 1 tablet (0.25 mg total) by mouth daily. Take estra 0.5 tab on Tuesday and Friday. 105 tablet 1  . fluticasone (FLOVENT HFA) 110 MCG/ACT inhaler Inhale 2 puffs into the lungs 2 (two) times daily. 3 Inhaler 3  . ipratropium-albuterol (DUONEB) 0.5-2.5 (3) MG/3ML SOLN Take 3 mLs by nebulization 3 (three) times daily as needed. Use 1 vial 3 times daily as needed for SOB dia j45.1 810 mL 0  . levothyroxine (SYNTHROID, LEVOTHROID) 25 MCG tablet TAKE 1 DAILY BY MOUTH IN THE MORNING ON AN EMPTY STOMACH 90 tablet 1  . metoprolol tartrate (LOPRESSOR) 100 MG tablet Take 1.5 tablets twice daily. 270 tablet 1  . montelukast (SINGULAIR) 10 MG tablet TAKE 1 TABLET BY MOUTH EVERY DAY FOR COUGH 90 tablet 2  . olmesartan (BENICAR) 40 MG tablet Take 1 tab po daily 90 tablet 1  . sertraline (ZOLOFT) 100 MG tablet Take 1 tablet (100 mg total) by mouth daily. 90  tablet 2  . simvastatin (ZOCOR) 20 MG tablet Take 1 tablet (20 mg total) by mouth at bedtime. 90 tablet 1  . albuterol (PROVENTIL HFA;VENTOLIN HFA) 108 (90 Base) MCG/ACT inhaler Inhale 2 puffs into the lungs every 6 (six) hours as needed for wheezing or shortness of breath. 1 Inhaler 5  . Blood Glucose Monitoring Suppl (FREESTYLE FREEDOM LITE) w/Device KIT 1 kit by Does not apply route daily. 1 each 0  . denosumab (PROLIA) 60 MG/ML SOSY injection Inject 60 mg into the skin every 6 (six) months.    . fluticasone (FLONASE) 50 MCG/ACT nasal spray Place 1 spray into both nostrils daily. Use 1-2 sprays daily. 16 g 3  . glucose blood (FREESTYLE LITE) test strip Use as directed once a daily Diag E11.65 100 each 12  . Lancets (FREESTYLE) lancets Use as directed once daily diag e11.65 100 each 12  . mometasone (ELOCON) 0.1 % lotion Apply 4 drops topically at bedtime as needed for itching.    . OXYGEN Inhale 2 L into the lungs every evening. Sleep with nasal cannula w/ O2 at 2lpm.       . aspirin EC  81 mg Oral Daily  . budesonide (PULMICORT) nebulizer solution  0.25 mg Nebulization BID  . digoxin  0.25 mg Oral Daily  . enoxaparin (LOVENOX) injection  40 mg Subcutaneous Q24H  . fluticasone  1 spray Each  Nare Daily  . irbesartan  37.5 mg Oral Daily  . levothyroxine  25 mcg Oral Q0600  . metoprolol tartrate  150 mg Oral BID  . montelukast  10 mg Oral QHS  . sertraline  100 mg Oral Daily  . simvastatin  20 mg Oral QHS    Infusions:   Allergies  Allergen Reactions  . Hydralazine Itching and Swelling  . Biaxin [Clarithromycin] Nausea And Vomiting  . Ciprofloxacin Other (See Comments)    Hand cramping   . Meloxicam   . Minocycline     Social History   Socioeconomic History  . Marital status: Widowed    Spouse name: Not on file  . Number of children: Not on file  . Years of education: Not on file  . Highest education level: Not on file  Occupational History  . Not on file  Social  Needs  . Financial resource strain: Not on file  . Food insecurity    Worry: Not on file    Inability: Not on file  . Transportation needs    Medical: Not on file    Non-medical: Not on file  Tobacco Use  . Smoking status: Never Smoker  . Smokeless tobacco: Never Used  Substance and Sexual Activity  . Alcohol use: No  . Drug use: No  . Sexual activity: Never  Lifestyle  . Physical activity    Days per week: Not on file    Minutes per session: Not on file  . Stress: Not on file  Relationships  . Social Herbalist on phone: Not on file    Gets together: Not on file    Attends religious service: Not on file    Active member of club or organization: Not on file    Attends meetings of clubs or organizations: Not on file    Relationship status: Not on file  . Intimate partner violence    Fear of current or ex partner: Not on file    Emotionally abused: Not on file    Physically abused: Not on file    Forced sexual activity: Not on file  Other Topics Concern  . Not on file  Social History Narrative  . Not on file    Family History  Problem Relation Age of Onset  . Osteoarthritis Mother   . Depression Father   . Breast cancer Neg Hx     PHYSICAL EXAM: Vitals:   10/10/18 2052 10/11/18 0337  BP: 137/70 (!) 136/93  Pulse: 73 66  Resp:  18  Temp: 98.3 F (36.8 C) 98.2 F (36.8 C)  SpO2: 98% 94%     Intake/Output Summary (Last 24 hours) at 10/11/2018 0739 Last data filed at 10/11/2018 0100 Gross per 24 hour  Intake -  Output 350 ml  Net -350 ml    85 y.o. female with pertinent past medical history of DVT, hyperlipidemia, diet-controlled diabetes, hypertension, depression, melanoma, atrial fibrillation and arthritis presenting to the ED with chief complaints of dizziness. HEENT: normal Neck: supple. no JVD. Carotids 2+ bilat; no bruits. No lymphadenopathy or thryomegaly appreciated. Cor: PMI nondisplaced. Regular rate & rhythm. No rubs, gallops or  murmurs. Lungs: clear Abdomen: soft, nontender, nondistended. No hepatosplenomegaly. No bruits or masses. Good bowel sounds. Extremities: no cyanosis, clubbing, rash, edema Neuro: alert & oriented x 3, cranial nerves grossly intact. moves all 4 extremities w/o difficulty. Affect pleasant.  ECG: atrial fibrillation, rapid ventricular rate 146, LVH PR interval * ms QRS duration  71 ms QT/QTc 330/515 ms P-R-T axes * 35 259  Results for orders placed or performed during the hospital encounter of 10/10/18 (from the past 24 hour(s))  CBC     Status: None   Collection Time: 10/10/18 10:58 AM  Result Value Ref Range   WBC 7.7 4.0 - 10.5 K/uL   RBC 4.19 3.87 - 5.11 MIL/uL   Hemoglobin 13.3 12.0 - 15.0 g/dL   HCT 41.2 36.0 - 46.0 %   MCV 98.3 80.0 - 100.0 fL   MCH 31.7 26.0 - 34.0 pg   MCHC 32.3 30.0 - 36.0 g/dL   RDW 13.7 11.5 - 15.5 %   Platelets 213 150 - 400 K/uL   nRBC 0.0 0.0 - 0.2 %  Troponin I - ONCE - STAT     Status: Abnormal   Collection Time: 10/10/18 10:58 AM  Result Value Ref Range   Troponin I 0.04 (HH) <0.03 ng/mL  Comprehensive metabolic panel     Status: Abnormal   Collection Time: 10/10/18 10:58 AM  Result Value Ref Range   Sodium 138 135 - 145 mmol/L   Potassium 4.3 3.5 - 5.1 mmol/L   Chloride 99 98 - 111 mmol/L   CO2 31 22 - 32 mmol/L   Glucose, Bld 176 (H) 70 - 99 mg/dL   BUN 17 8 - 23 mg/dL   Creatinine, Ser 0.48 0.44 - 1.00 mg/dL   Calcium 8.8 (L) 8.9 - 10.3 mg/dL   Total Protein 7.2 6.5 - 8.1 g/dL   Albumin 3.2 (L) 3.5 - 5.0 g/dL   AST 20 15 - 41 U/L   ALT 21 0 - 44 U/L   Alkaline Phosphatase 52 38 - 126 U/L   Total Bilirubin 0.7 0.3 - 1.2 mg/dL   GFR calc non Af Amer >60 >60 mL/min   GFR calc Af Amer >60 >60 mL/min   Anion gap 8 5 - 15  Digoxin level     Status: Abnormal   Collection Time: 10/10/18 11:13 AM  Result Value Ref Range   Digoxin Level 0.7 (L) 0.8 - 2.0 ng/mL  Troponin I - ONCE - STAT     Status: Abnormal   Collection Time: 10/10/18   1:24 PM  Result Value Ref Range   Troponin I 0.03 (HH) <0.03 ng/mL  SARS Coronavirus 2 (CEPHEID - Performed in [place] hospital lab), Hosp Order     Status: None   Collection Time: 10/10/18  2:49 PM   Specimen: Nasopharyngeal Swab  Result Value Ref Range   SARS Coronavirus 2 NEGATIVE NEGATIVE   Dg Chest Portable 1 View  Result Date: 10/10/2018 CLINICAL DATA:  Dizziness when standing. EXAM: PORTABLE CHEST 1 VIEW COMPARISON:  08/21/2015 FINDINGS: Cardiomediastinal silhouette is mildly enlarged. Calcific atherosclerotic disease of the aorta. There is no evidence of focal airspace consolidation, pleural effusion or pneumothorax. Osseous structures are without acute abnormality. Soft tissues are grossly normal. IMPRESSION: 1. Mildly enlarged cardiac silhouette. 2. Calcific atherosclerotic disease of the aorta. Electronically Signed   By: Fidela Salisbury M.D.   On: 10/10/2018 11:54     ASSESSMENT AND PLAN:  Atrial fibrillation with RVR: Rhythm remains in atrial flutter/fibrillation per monitor, rate controlled at 88bpm, continue oral metoprolol, digoxin, diltiazem, and add Eliquis 2.38m BID.  Elevated troponin: Mildly elevated, likely due to demand ischemia secondary to afib RVR.   Patient is feeling well, may discharge and advise follow up at Dr. KTrish Mageoffice AVillasthis Thursday 10am.   KJake Bathe  NP-C Cell: [phone]

## 2018-10-11 NOTE — Progress Notes (Signed)
Ambulated pt around nursing station.  Atrial fib/flutter accelerated to 145, and sustained >120.  Dr. Posey Pronto informed.  New order received for cardizem PO.  Pt updated regarding the situation, and that her discharge will be deferred until her HR can be controlled during exertion (per Dr. Posey Pronto).

## 2018-10-12 DIAGNOSIS — E119 Type 2 diabetes mellitus without complications: Secondary | ICD-10-CM | POA: Diagnosis not present

## 2018-10-12 DIAGNOSIS — I4891 Unspecified atrial fibrillation: Secondary | ICD-10-CM | POA: Diagnosis not present

## 2018-10-12 DIAGNOSIS — I10 Essential (primary) hypertension: Secondary | ICD-10-CM | POA: Diagnosis not present

## 2018-10-12 DIAGNOSIS — E785 Hyperlipidemia, unspecified: Secondary | ICD-10-CM | POA: Diagnosis not present

## 2018-10-12 MED ORDER — DILTIAZEM HCL 60 MG PO TABS: 60.0000 mg | ORAL_TABLET | Freq: Two times a day (BID) | ORAL | 1 refills | Status: DC

## 2018-10-12 MED ORDER — DILTIAZEM HCL 30 MG PO TABS: 60.0000 mg | ORAL_TABLET | Freq: Two times a day (BID) | ORAL | Status: DC

## 2018-10-12 MED ORDER — APIXABAN 2.5 MG PO TABS: 2.5000 mg | ORAL_TABLET | Freq: Two times a day (BID) | ORAL | 2 refills | Status: DC

## 2018-10-12 MED ORDER — ROSUVASTATIN CALCIUM 5 MG PO TABS: 5.0000 mg | ORAL_TABLET | Freq: Every day | ORAL | 2 refills | Status: DC

## 2018-10-12 NOTE — Progress Notes (Signed)
Pt discharged home via family. IV discontinued without incident, telemetry box returned to Network engineer. Discharge instructions reviewed with daughter over the phone. All questions answered.

## 2018-10-12 NOTE — Discharge Summary (Signed)
Ashley Weiss at [facility] NAME: Ashley Weiss    MR#:  [id number]  DATE OF BIRTH:  Feb 11, 1920  DATE OF ADMISSION:  10/10/2018 ADMITTING PHYSICIAN: Ashley Grayer, MD  DATE OF DISCHARGE: 10/12/2018  PRIMARY CARE PHYSICIAN: Ashley Guise, MD    ADMISSION DIAGNOSIS:  Atrial fibrillation with RVR ([place]) [I48.91]  DISCHARGE DIAGNOSIS:  atrial fibrillation with RVR  SECONDARY DIAGNOSIS:   Past Medical History:  Diagnosis Date  . Arthritis   . Asthma   . Cancer ([place])    skin  . Depression   . DVT (deep venous thrombosis) ([place])   . Hyperlipidemia   . Hypertension   . Phlebitis     HOSPITAL COURSE:   85 y.o.femalewith pertinent past medical history ofDVT, hyperlipidemia, diet-controlled diabetes, hypertension, depression, melanoma, atrial fibrillation and arthritis presenting to the ED with  complaints of dizziness.  1.Atrial Fibrillationwith RVR -patient with history of atrial fibrillation - EKGreviewed and shows atrial fibrillation with RVR rate of 146 bpm - Troponinsslightly elevated - TSHslightly elevated 4.6, FT41.22 - CXRshows no active cardiopulmonary process -Continue home metoprolol 130m PO bid  -Continue home digoxin,recentdigoxin level 0.7 -Cardiology Consult with Dr. KAlfonse Spruceinput -patient's heart rate went up with ambulation. Will add Cardizem 30 mg Q ID--change to 60 mg bid -cardiology recommendations for eliquis noted. No history of G.I. bleed in the past this was confirmed from patient's daughter Ashley Weiss-overall improved. HR 90's with activity. Pt feels ok  2.Elevated troponin-likely demand ischemia due to rapid a fib.  3.HLD  + Goal LDL<100 -patient takes simvastatin270mPO qhs-- however having interaction with Cardizem will switch her to Crestor 5 mg Q HS  4.HTN  + Goal BP <140/90 - Beta-blocker:Metoprolol as above - ACE-Inhibitor: Continue olmesartan at home  5.DM  =Diet controlled  6.Hypothyroidism -Continue Synthroid  7.Thromboembolism Risk Management-eliquis  8.Depression-continue sertraline 100 mg daily  spoke with patient's daughter DoButch Pennyn the phone and updated. [facility] CONSULTS OBTAINED:  Treatment Team:  Ashley DavidMD  Ashley Weiss:   Weiss  Allergen Reactions  . Hydralazine Itching and Swelling  . Biaxin [Clarithromycin] Nausea And Vomiting  . Ciprofloxacin Other (See Comments)    Hand cramping   . Meloxicam   . Minocycline     DISCHARGE MEDICATIONS:   Weiss as of 10/12/2018      Reactions   Hydralazine Itching, Swelling   Biaxin [clarithromycin] Nausea And Vomiting   Ciprofloxacin Other (See Comments)   Hand cramping   Meloxicam    Minocycline       Medication List    STOP taking these medications   aspirin 81 MG tablet   simvastatin 20 MG tablet Commonly known as: ZOCOR     TAKE these medications   albuterol 108 (90 Base) MCG/ACT inhaler Commonly known as: VENTOLIN HFA Inhale 2 puffs into the lungs every 6 (six) hours as needed for wheezing or shortness of breath.   apixaban 2.5 MG Tabs tablet Commonly known as: ELIQUIS Take 1 tablet (2.5 mg total) by mouth 2 (two) times daily.   denosumab 60 MG/ML Sosy injection Commonly known as: PROLIA Inject 60 mg into the skin every 6 (six) months.   digoxin 0.25 MG tablet Commonly known as: LANOXIN Take 1 tablet (0.25 mg total) by mouth daily. Take estra 0.5 tab on Tuesday and Friday.   diltiazem 60 MG tablet Commonly known as: CARDIZEM Take 1 tablet (60 mg total) by  mouth every 12 (twelve) hours.   fluticasone 110 MCG/ACT inhaler Commonly known as: FLOVENT HFA Inhale 2 puffs into the lungs 2 (two) times daily.   fluticasone 50 MCG/ACT nasal spray Commonly known as: FLONASE Place 1 spray into both nostrils daily. Use 1-2 sprays daily.   FreeStyle Freedom Lite w/Device Kit 1 kit by Does not apply  route daily.   freestyle lancets Use as directed once daily diag e11.65   glucose blood test strip Commonly known as: FREESTYLE LITE Use as directed once a daily Diag E11.65   ipratropium-albuterol 0.5-2.5 (3) MG/3ML Soln Commonly known as: DUONEB Take 3 mLs by nebulization 3 (three) times daily as needed. Use 1 vial 3 times daily as needed for SOB dia j45.1   levothyroxine 25 MCG tablet Commonly known as: SYNTHROID TAKE 1 DAILY BY MOUTH IN THE MORNING ON AN EMPTY STOMACH   metoprolol tartrate 100 MG tablet Commonly known as: LOPRESSOR Take 1.5 tablets twice daily.   mometasone 0.1 % lotion Commonly known as: ELOCON Apply 4 drops topically at bedtime as needed for itching.   montelukast 10 MG tablet Commonly known as: SINGULAIR TAKE 1 TABLET BY MOUTH EVERY DAY FOR COUGH   olmesartan 40 MG tablet Commonly known as: BENICAR Take 1 tab po daily   OXYGEN Inhale 2 L into the lungs every evening. Sleep with nasal cannula w/ O2 at 2lpm.   rosuvastatin 5 MG tablet Commonly known as: CRESTOR Take 1 tablet (5 mg total) by mouth daily at 6 PM.   sertraline 100 MG tablet Commonly known as: ZOLOFT Take 1 tablet (100 mg total) by mouth daily.       If you experience worsening of your admission symptoms, develop shortness of breath, life threatening emergency, suicidal or homicidal thoughts you must seek medical attention immediately by calling 911 or calling your MD immediately  if symptoms less severe.  You Must read complete instructions/literature along with all the possible adverse reactions/side effects for all the Medicines you take and that have been prescribed to you. Take any new Medicines after you have completely understood and accept all the possible adverse reactions/side effects.   Please note  You were cared for by a hospitalist during your hospital stay. If you have any questions about your discharge medications or the care you received while you were in the  hospital after you are discharged, you can call the unit and asked to speak with the hospitalist on call if the hospitalist that took care of you is not available. Once you are discharged, your primary care physician will handle any further medical issues. Please note that NO REFILLS for any discharge medications will be authorized once you are discharged, as it is imperative that you return to your primary care physician (or establish a relationship with a primary care physician if you do not have one) for your aftercare needs so that they can reassess your need for medications and monitor your lab values. Today   SUBJECTIVE   Doing well. HR 91 with acitivity  VITAL SIGNS:  Blood pressure 133/77, pulse 71, temperature 98 F (36.7 C), temperature source Oral, resp. rate 18, height _0  (1.626 m), weight 56.6 kg, SpO2 96 %.  I/O:    Intake/Output Summary (Last 24 hours) at 10/12/2018 1000 Last data filed at 10/12/2018 0820 Gross per 24 hour  Intake 480 ml  Output 950 ml  Net -470 ml    PHYSICAL EXAMINATION:  GENERAL:  85 y.o.-year-old patient lying in  the bed with no acute distress.  EYES: Pupils equal, round, reactive to light and accommodation. No scleral icterus. Extraocular muscles intact.  HEENT: Head atraumatic, normocephalic. Oropharynx and nasopharynx clear.  NECK:  Supple, no jugular venous distention. No thyroid enlargement, no tenderness.  LUNGS: Normal breath sounds bilaterally, no wheezing, rales,rhonchi or crepitation. No use of accessory muscles of respiration.  CARDIOVASCULAR: S1, S2 normal. No murmurs, rubs, or gallops. irregularly irregular ABDOMEN: Soft, non-tender, non-distended. Bowel sounds present. No organomegaly or mass.  EXTREMITIES: No pedal edema, cyanosis, or clubbing.  NEUROLOGIC: Cranial nerves II through XII are intact. Muscle strength 5/5 in all extremities. Sensation intact. Gait not checked.  PSYCHIATRIC: The patient is alert and oriented x 3.  SKIN:  No obvious rash, lesion, or ulcer.   DATA REVIEW:   CBC  Recent Labs  Lab 10/10/18 1058  WBC 7.7  HGB 13.3  HCT 41.2  PLT 213    Chemistries  Recent Labs  Lab 10/10/18 1058  NA 138  K 4.3  CL 99  CO2 31  GLUCOSE 176*  BUN 17  CREATININE 0.48  CALCIUM 8.8*  AST 20  ALT 21  ALKPHOS 52  BILITOT 0.7    Microbiology Results   Recent Results (from the past 240 hour(s))  SARS Coronavirus 2 (CEPHEID - Performed in [place] hospital lab), Hosp Order     Status: None   Collection Time: 10/10/18  2:49 PM   Specimen: Nasopharyngeal Swab  Result Value Ref Range Status   SARS Coronavirus 2 NEGATIVE NEGATIVE Final    Comment: (NOTE) If result is NEGATIVE SARS-CoV-2 target nucleic acids are NOT DETECTED. The SARS-CoV-2 RNA is generally detectable in upper and lower  respiratory specimens during the acute phase of infection. The lowest  concentration of SARS-CoV-2 viral copies this assay can detect is 250  copies / mL. A negative result does not preclude SARS-CoV-2 infection  and should not be used as the sole basis for treatment or other  patient management decisions.  A negative result may occur with  improper specimen collection / handling, submission of specimen other  than nasopharyngeal swab, presence of viral mutation(s) within the  areas targeted by this assay, and inadequate number of viral copies  (<250 copies / mL). A negative result must be combined with clinical  observations, patient history, and epidemiological information. If result is POSITIVE SARS-CoV-2 target nucleic acids are DETECTED. The SARS-CoV-2 RNA is generally detectable in upper and lower  respiratory specimens dur ing the acute phase of infection.  Positive  results are indicative of active infection with SARS-CoV-2.  Clinical  correlation with patient history and other diagnostic information is  necessary to determine patient infection status.  Positive results do  not rule out bacterial  infection or co-infection with other viruses. If result is PRESUMPTIVE POSTIVE SARS-CoV-2 nucleic acids MAY BE PRESENT.   A presumptive positive result was obtained on the submitted specimen  and confirmed on repeat testing.  While 2019 novel coronavirus  (SARS-CoV-2) nucleic acids may be present in the submitted sample  additional confirmatory testing may be necessary for epidemiological  and / or clinical management purposes  to differentiate between  SARS-CoV-2 and other Sarbecovirus currently known to infect humans.  If clinically indicated additional testing with an alternate test  methodology [phone]) is advised. The SARS-CoV-2 RNA is generally  detectable in upper and lower respiratory sp ecimens during the acute  phase of infection. The expected result is Negative. Fact Sheet for Patients:  StrictlyIdeas.no Fact Sheet for Healthcare Providers: BankingDealers.co.za This test is not yet approved or cleared by the [place] FDA and has been authorized for detection and/or diagnosis of SARS-CoV-2 by FDA under an Emergency Use Authorization (EUA).  This EUA will remain in effect (meaning this test can be used) for the duration of the COVID-19 declaration under Section 564(b)(1) of the Act, 21 U.S.C. section 360bbb-3(b)(1), unless the authorization is terminated or revoked sooner. Performed at [facility], [address], [place], [place] [address]     RADIOLOGY:  Dg Chest Portable 1 View  Result Date: 10/10/2018 CLINICAL DATA:  Dizziness when standing. EXAM: PORTABLE CHEST 1 VIEW COMPARISON:  08/21/2015 FINDINGS: Cardiomediastinal silhouette is mildly enlarged. Calcific atherosclerotic disease of the aorta. There is no evidence of focal airspace consolidation, pleural effusion or pneumothorax. Osseous structures are without acute abnormality. Soft tissues are grossly normal. IMPRESSION: 1. Mildly enlarged cardiac  silhouette. 2. Calcific atherosclerotic disease of the aorta. Electronically Signed   By: Fidela Salisbury M.D.   On: 10/10/2018 11:54     CODE STATUS:     Code Status Orders  (From admission, onward)         Start     Ordered   10/10/18 1546  Do not attempt resuscitation (DNR)  Continuous    Question Answer Comment  In the event of cardiac or respiratory ARREST Do not call a "code blue"   In the event of cardiac or respiratory ARREST Do not perform Intubation, CPR, defibrillation or ACLS   In the event of cardiac or respiratory ARREST Use medication by any route, position, wound care, and other measures to relive pain and suffering. May use oxygen, suction and manual treatment of airway obstruction as needed for comfort.      10/10/18 1546        Code Status History    This patient has a current code status but no historical code status.   Advance Care Planning Activity    Advance Directive Documentation     Most Recent Value  Type of Advance Directive  Healthcare Power of Attorney, Living will  Pre-existing out of facility DNR order (yellow form or pink MOST form)  -  "MOST" Form in Place?  -      TOTAL TIME TAKING CARE OF THIS PATIENT: *40* minutes.    Fritzi Mandes M.D on 10/12/2018 at 10:00 AM  Between 7am to 6pm - Pager - [phone] After 6pm go to [email or web address] - password [email or web address] [facility]  Office  [phone]  CC: Primary care physician; Ashley Guise, MD

## 2018-10-12 NOTE — Progress Notes (Signed)
SUBJECTIVE: Feeling well, no palpitations or chest pain.   Vitals:   10/11/18 2256 10/12/18 0429 10/12/18 0739 10/12/18 0854  BP: 135/69 126/80 133/77   Pulse: 74 73 71   Resp:  18 18   Temp:  98.6 F (37 C) 98 F (36.7 C)   TempSrc:   Oral   SpO2:  96% 96% 96%  Weight:      Height:        Intake/Output Summary (Last 24 hours) at 10/12/2018 1001 Last data filed at 10/12/2018 0820 Gross per 24 hour  Intake 480 ml  Output 950 ml  Net -470 ml    LABS: Basic Metabolic Panel: Recent Labs    10/10/18 1058  NA 138  K 4.3  CL 99  CO2 31  GLUCOSE 176*  BUN 17  CREATININE 0.48  CALCIUM 8.8*   Liver Function Tests: Recent Labs    10/10/18 1058  AST 20  ALT 21  ALKPHOS 52  BILITOT 0.7  PROT 7.2  ALBUMIN 3.2*   No results for input(s): LIPASE, AMYLASE in the last 72 hours. CBC: Recent Labs    10/10/18 1058  WBC 7.7  HGB 13.3  HCT 41.2  MCV 98.3  PLT 213   Cardiac Enzymes: Recent Labs    10/10/18 1058 10/10/18 1324  TROPONINI 0.04* 0.03*   BNP: Invalid input(s): POCBNP D-Dimer: No results for input(s): DDIMER in the last 72 hours. Hemoglobin A1C: No results for input(s): HGBA1C in the last 72 hours. Fasting Lipid Panel: No results for input(s): CHOL, HDL, LDLCALC, TRIG, CHOLHDL, LDLDIRECT in the last 72 hours. Thyroid Function Tests: No results for input(s): TSH, T4TOTAL, T3FREE, THYROIDAB in the last 72 hours.  Invalid input(s): FREET3 Anemia Panel: No results for input(s): VITAMINB12, FOLATE, FERRITIN, TIBC, IRON, RETICCTPCT in the last 72 hours.   PHYSICAL EXAM General: Well developed, well nourished, in no acute distress HEENT:  Normocephalic and atramatic Neck:  No JVD.  Lungs: Clear bilaterally to auscultation and percussion. Heart: HRRR . Normal S1 and S2 without gallops or murmurs.  Abdomen: Bowel sounds are positive, abdomen soft and non-tender  Msk:  Back normal, normal gait. Normal strength and tone for age. Extremities: No  clubbing, cyanosis or edema.   Neuro: Alert and oriented X 3. Psych:  Good affect, responds appropriately  TELEMETRY: NSR 76bpm with frequent PACs   ASSESSMENT AND PLAN:  Atrial fibrillation with RVR: Rhythm remains in atrial flutter/fibrillation per monitor, rate controlled at 76bpm, continue oral metoprolol, digoxin, diltiazem, add Eliquis 2.5mg  BID.  Elevated troponin: Mildly elevated, likely due to demand ischemia secondary to afib RVR.   Patient is feeling well, may discharge and advise follow up at Dr. Trish Mage office [place] this Wednesday 10:30am.   Jake Bathe, NP-C Cell: [phone]

## 2018-10-14 DIAGNOSIS — R9431 Abnormal electrocardiogram [ECG] [EKG]: Secondary | ICD-10-CM | POA: Diagnosis not present

## 2018-10-14 DIAGNOSIS — I480 Paroxysmal atrial fibrillation: Secondary | ICD-10-CM | POA: Diagnosis not present

## 2018-10-14 DIAGNOSIS — R42 Dizziness and giddiness: Secondary | ICD-10-CM | POA: Diagnosis not present

## 2018-10-16 ENCOUNTER — Ambulatory Visit: Payer: Medicare Other

## 2018-10-16 ENCOUNTER — Other Ambulatory Visit: Payer: Self-pay

## 2018-10-16 ENCOUNTER — Encounter: Payer: Self-pay | Admitting: Cardiovascular Disease

## 2018-10-16 DIAGNOSIS — I482 Chronic atrial fibrillation, unspecified: Secondary | ICD-10-CM | POA: Diagnosis not present

## 2018-10-19 ENCOUNTER — Ambulatory Visit (INDEPENDENT_AMBULATORY_CARE_PROVIDER_SITE_OTHER): Payer: Medicare Other | Admitting: Nurse Practitioner

## 2018-10-19 ENCOUNTER — Other Ambulatory Visit: Payer: Self-pay

## 2018-10-19 VITALS — BP 144/70 | HR 63 | Resp 16 | Ht 64.0 in | Wt 128.2 lb

## 2018-10-19 DIAGNOSIS — I480 Paroxysmal atrial fibrillation: Secondary | ICD-10-CM

## 2018-10-19 DIAGNOSIS — J4520 Mild intermittent asthma, uncomplicated: Secondary | ICD-10-CM

## 2018-10-19 DIAGNOSIS — R0602 Shortness of breath: Secondary | ICD-10-CM | POA: Diagnosis not present

## 2018-10-19 DIAGNOSIS — I10 Essential (primary) hypertension: Secondary | ICD-10-CM

## 2018-10-19 MED ORDER — OLMESARTAN MEDOXOMIL 40 MG PO TABS: 20.0000 mg | ORAL_TABLET | Freq: Every day | ORAL | 1 refills | Status: DC

## 2018-10-19 MED ORDER — DILTIAZEM HCL 60 MG PO TABS: 30.0000 mg | ORAL_TABLET | Freq: Two times a day (BID) | ORAL | 1 refills | Status: DC

## 2018-10-19 NOTE — Progress Notes (Signed)
[facility] [place], [place] [address]  Internal MEDICINE  Office Visit Note  Patient Name: Ashley Weiss  [id number]  [id number]  Date of Service: 10/21/2018  Chief Complaint  Patient presents with  . Medical Management of Chronic Issues    follow up  . Labs Only    echo and lab results  . Hospitalization Follow-up    questions about medications  . Atrial Fibrillation    pt is walking daily, and having assistance with medications, using cane for walking, suggesting any recommendations for her to be on her own, there are speakers and cameras inside of the home where the family can reach the pt, pt is doing some ADLs independently    The patient is here for follow up of echocardiogram. Did have recent hospitalization related to atrial fibrillation. While in the hospital,  cardizem was decreased to half tablet twice daily. and benicar also reduced to 1/2 tablet. If systolic blood pressure is over 160, she is to take whole tablet. If systolic pressure is less than 120, she holds benicar completely. With recent blood pressure log, she has had to take whole tablet only twice. benicar has not had to be held at all. She is also on eliquis. She had echocardiogram done since her last visit. She has normal LVF, left atrial enlargement, dilated IVC, and mild MR and TR. She will need to see cardiology for further evaluation and management. Today, she is feeling good. She is able to compelte her ADLs without much assistance.       Current Medication: Outpatient Encounter Medications as of 10/19/2018  Medication Sig  . albuterol (PROVENTIL HFA;VENTOLIN HFA) 108 (90 Base) MCG/ACT inhaler Inhale 2 puffs into the lungs every 6 (six) hours as needed for wheezing or shortness of breath.  Marland Kitchen apixaban (ELIQUIS) 2.5 MG TABS tablet Take 1 tablet (2.5 mg total) by mouth 2 (two) times daily.  . Blood Glucose Monitoring Suppl (FREESTYLE FREEDOM LITE) w/Device KIT 1 kit by Does not  apply route daily.  Marland Kitchen denosumab (PROLIA) 60 MG/ML SOSY injection Inject 60 mg into the skin every 6 (six) months.  . digoxin (LANOXIN) 0.25 MG tablet Take 1 tablet (0.25 mg total) by mouth daily. Take estra 0.5 tab on Tuesday and Friday.  . diltiazem (CARDIZEM) 60 MG tablet Take 0.5 tablets (30 mg total) by mouth 2 (two) times daily.  . fluticasone (FLONASE) 50 MCG/ACT nasal spray Place 1 spray into both nostrils daily. Use 1-2 sprays daily.  . fluticasone (FLOVENT HFA) 110 MCG/ACT inhaler Inhale 2 puffs into the lungs 2 (two) times daily.  Marland Kitchen glucose blood (FREESTYLE LITE) test strip Use as directed once a daily Diag E11.65  . ipratropium-albuterol (DUONEB) 0.5-2.5 (3) MG/3ML SOLN Take 3 mLs by nebulization 3 (three) times daily as needed. Use 1 vial 3 times daily as needed for SOB dia j45.1  . Lancets (FREESTYLE) lancets Use as directed once daily diag e11.65  . levothyroxine (SYNTHROID, LEVOTHROID) 25 MCG tablet TAKE 1 DAILY BY MOUTH IN THE MORNING ON AN EMPTY STOMACH  . metoprolol tartrate (LOPRESSOR) 100 MG tablet Take 1.5 tablets twice daily.  . mometasone (ELOCON) 0.1 % lotion Apply 4 drops topically at bedtime as needed for itching.  . montelukast (SINGULAIR) 10 MG tablet TAKE 1 TABLET BY MOUTH EVERY DAY FOR COUGH  . olmesartan (BENICAR) 40 MG tablet Take 0.5 tablets (20 mg total) by mouth daily. Take 1 tablet if systolic pressure is greater than 160. Hold  completely if systolic pressure is less than 120  . OXYGEN Inhale 2 L into the lungs every evening. Sleep with nasal cannula w/ O2 at 2lpm.  . rosuvastatin (CRESTOR) 5 MG tablet Take 1 tablet (5 mg total) by mouth daily at 6 PM.  . sertraline (ZOLOFT) 100 MG tablet Take 1 tablet (100 mg total) by mouth daily.  . [DISCONTINUED] diltiazem (CARDIZEM) 60 MG tablet Take 1 tablet (60 mg total) by mouth every 12 (twelve) hours.  . [DISCONTINUED] olmesartan (BENICAR) 40 MG tablet Take 1 tab po daily   No facility-administered encounter  medications on file as of 10/19/2018.     Surgical History: Past Surgical History:  Procedure Laterality Date  . BREAST CYST ASPIRATION Left 20+ yrs ago  . CATARACT EXTRACTION Bilateral 2005,2009  . cyst removal-right shoulder  1972  . otosclerosis Left 1988   hardening of bone, other 1989 redone  . THYROIDECTOMY  1970   nodule and 1/2 bridge removal  . WRIST SURGERY Left 11/29/2009    Medical History: Past Medical History:  Diagnosis Date  . Arthritis   . Asthma   . Cancer ([place])    skin  . Depression   . DVT (deep venous thrombosis) ([place])   . Hyperlipidemia   . Hypertension   . Phlebitis     Family History: Family History  Problem Relation Age of Onset  . Osteoarthritis Mother   . Depression Father   . Breast cancer Neg Hx     Social History   Socioeconomic History  . Marital status: Widowed    Spouse name: Not on file  . Number of children: Not on file  . Years of education: Not on file  . Highest education level: Not on file  Occupational History  . Not on file  Social Needs  . Financial resource strain: Not on file  . Food insecurity    Worry: Not on file    Inability: Not on file  . Transportation needs    Medical: Not on file    Non-medical: Not on file  Tobacco Use  . Smoking status: Never Smoker  . Smokeless tobacco: Never Used  Substance and Sexual Activity  . Alcohol use: No  . Drug use: No  . Sexual activity: Never  Lifestyle  . Physical activity    Days per week: Not on file    Minutes per session: Not on file  . Stress: Not on file  Relationships  . Social Herbalist on phone: Not on file    Gets together: Not on file    Attends religious service: Not on file    Active member of club or organization: Not on file    Attends meetings of clubs or organizations: Not on file    Relationship status: Not on file  . Intimate partner violence    Fear of current or ex partner: Not on file    Emotionally abused: Not on file     Physically abused: Not on file    Forced sexual activity: Not on file  Other Topics Concern  . Not on file  Social History Narrative  . Not on file      Review of Systems  Constitutional: Positive for fatigue. Negative for chills and diaphoresis.  HENT: Negative for congestion, ear pain, postnasal drip and sinus pressure.   Eyes: Negative for photophobia.  Respiratory: Negative for cough, shortness of breath and wheezing.        Shortness of  breath with exertion. Uses neb treatments twice daily  Cardiovascular: Positive for palpitations. Negative for chest pain and leg swelling.  Gastrointestinal: Negative for constipation, diarrhea, nausea and vomiting.  Endocrine: Negative for cold intolerance, heat intolerance, polydipsia and polyuria.  Musculoskeletal: Positive for arthralgias and back pain. Negative for gait problem and neck pain.       Right hand/wrist pain. Improving.   Skin: Negative for color change and rash.  Allergic/Immunologic: Positive for environmental allergies. Negative for food allergies.  Neurological: Positive for dizziness. Negative for weakness and headaches.  Hematological: Negative for adenopathy. Does not bruise/bleed easily.  Psychiatric/Behavioral: Negative for agitation, behavioral problems (depression) and hallucinations. The patient is nervous/anxious.     Today's Vitals   10/19/18 1119  BP: (!) 144/70  Pulse: 63  Resp: 16  SpO2: 95%  Weight: 128 lb 3.2 oz (58.2 kg)  Height: 5' 4"  (1.626 m)   Body mass index is 22.01 kg/m.  Physical Exam Vitals signs and nursing note reviewed.  Constitutional:      General: She is not in acute distress.    Appearance: Normal appearance. She is well-developed. She is not diaphoretic.  HENT:     Head: Normocephalic and atraumatic.     Mouth/Throat:     Pharynx: No oropharyngeal exudate.  Eyes:     Pupils: Pupils are equal, round, and reactive to light.  Neck:     Musculoskeletal: Normal range of motion  and neck supple.     Thyroid: No thyromegaly.     Vascular: No JVD.     Trachea: No tracheal deviation.  Cardiovascular:     Rate and Rhythm: Normal rate. Rhythm irregular.     Heart sounds: Murmur present. No friction rub. No gallop.   Pulmonary:     Effort: Pulmonary effort is normal. No respiratory distress.     Breath sounds: Normal breath sounds. No wheezing or rales.  Chest:     Chest wall: No tenderness.  Abdominal:     Palpations: Abdomen is soft.  Musculoskeletal: Normal range of motion.  Lymphadenopathy:     Cervical: No cervical adenopathy.  Skin:    General: Skin is warm and dry.  Neurological:     Mental Status: She is alert and oriented to person, place, and time.     Cranial Nerves: No cranial nerve deficit.  Psychiatric:        Behavior: Behavior normal.        Thought Content: Thought content normal.        Judgment: Judgment normal.    Assessment/Plan: 1. Paroxysmal atrial fibrillation (HCC) Reviewed results of echocardiogram. Shows normal LVF with right atrial enlargement. There is mild mitral and tricuspid regurgitation.  This is slightly improved from recent echo. Due to new flare of atrial fibrillation. Will refer to cardiology for further evaluation and treatment.  - Ambulatory referral to Cardiology  2. Shortness of breath Improved. Continue breathing treatments as prescribed.   3. Essential (primary) hypertension Stable. Continue bp medication as prescribed.    4. Mild intermittent asthma without complication Continue inhalers and respiratory treatments as prescribed .  General Counseling: Aimi verbalizes understanding of the findings of todays visit and agrees with plan of treatment. I have discussed any further diagnostic evaluation that may be needed or ordered today. We also reviewed her medications today. she has been encouraged to call the office with any questions or concerns that should arise related to todays visit.  Cardiac risk factor  modification:  1. Control  blood pressure. 2. Exercise as prescribed. 3. Follow low sodium, low fat diet. and low fat and low cholestrol diet. 4. Take ASA 1m once a day. 5. Restricted calories diet to lose weight.  This patient was seen by HLeretha PolFNP Collaboration with Dr FLavera Guiseas a part of collaborative care agreement  Orders Placed This Encounter  Procedures  . Ambulatory referral to Cardiology    Meds ordered this encounter  Medications  . diltiazem (CARDIZEM) 60 MG tablet    Sig: Take 0.5 tablets (30 mg total) by mouth 2 (two) times daily.    Dispense:  60 tablet    Refill:  1    Order Specific Question:   Supervising Provider    Answer:   KLavera Guise[[[id number]] . olmesartan (BENICAR) 40 MG tablet    Sig: Take 0.5 tablets (20 mg total) by mouth daily. Take 1 tablet if systolic pressure is greater than 160. Hold completely if systolic pressure is less than 120    Dispense:  90 tablet    Refill:  1    Order Specific Question:   Supervising Provider    Answer:   KLavera Guise[[id number]]    Time spent: 339Minutes      Dr FLavera GuiseInternal medicine

## 2018-10-21 ENCOUNTER — Encounter: Payer: Self-pay | Admitting: Nurse Practitioner

## 2018-10-21 DIAGNOSIS — R0602 Shortness of breath: Secondary | ICD-10-CM | POA: Insufficient documentation

## 2018-10-26 ENCOUNTER — Ambulatory Visit (INDEPENDENT_AMBULATORY_CARE_PROVIDER_SITE_OTHER): Payer: Medicare Other | Admitting: Internal Medicine

## 2018-10-26 ENCOUNTER — Other Ambulatory Visit: Payer: Self-pay

## 2018-10-26 ENCOUNTER — Encounter: Payer: Self-pay | Admitting: Internal Medicine

## 2018-10-26 VITALS — BP 115/61 | HR 91 | Ht 62.0 in | Wt 128.0 lb

## 2018-10-26 DIAGNOSIS — I10 Essential (primary) hypertension: Secondary | ICD-10-CM

## 2018-10-26 DIAGNOSIS — I480 Paroxysmal atrial fibrillation: Secondary | ICD-10-CM | POA: Diagnosis not present

## 2018-10-26 MED ORDER — OLMESARTAN MEDOXOMIL 20 MG PO TABS: ORAL_TABLET | ORAL | 3 refills | Status: DC

## 2018-10-26 NOTE — Progress Notes (Signed)
Pt blood pressure and pulse has been irregular and the family is concerned. She has had 2 episodes of Atrial Fibrillation in the past 2 days and the family has noticed a change in her appearance.

## 2018-10-26 NOTE — Progress Notes (Signed)
[facility] [place], [place] [address]  Internal MEDICINE  Telephone Visit  Patient Name: Ashley Weiss  [id number]  [id number]  Date of Service: 10/26/2018  I connected with the patient at 1055 by telephone and verified the patients identity using two identifiers.   I discussed the limitations, risks, security and privacy concerns of performing an evaluation and management service by telephone and the availability of in person appointments. I also discussed with the patient that there may be a patient responsible charge related to the service.  The patient expressed understanding and agrees to proceed.    Chief Complaint  Patient presents with  . Telephone Screen    VIDEO VISIT [phone]  . Telephone Assessment  . Atrial Fibrillation    PT has had 2 atrial fibrillations in the past two days, her appearance is starting to look different even this morning    HPI Pt is connected via webcam due to risks of pandemic of COVID -19. Both daughters with her. Pt had few episodes of atrial fib over the weekend which responded well to low dose Cardizem 30 mg bid. Pt denies any sob or chest pain. Cardiology app is pending, daughter is concerned about her living alone at home and has been staying with her for the time being    Current Medication: Outpatient Encounter Medications as of 10/26/2018  Medication Sig  . albuterol (PROVENTIL HFA;VENTOLIN HFA) 108 (90 Base) MCG/ACT inhaler Inhale 2 puffs into the lungs every 6 (six) hours as needed for wheezing or shortness of breath.  Marland Kitchen apixaban (ELIQUIS) 2.5 MG TABS tablet Take 1 tablet (2.5 mg total) by mouth 2 (two) times daily.  . Blood Glucose Monitoring Suppl (FREESTYLE FREEDOM LITE) w/Device KIT 1 kit by Does not apply route daily.  Marland Kitchen denosumab (PROLIA) 60 MG/ML SOSY injection Inject 60 mg into the skin every 6 (six) months.  . digoxin (LANOXIN) 0.25 MG tablet Take 1 tablet (0.25 mg total) by mouth daily. Take estra 0.5  tab on Tuesday and Friday.  . diltiazem (CARDIZEM) 60 MG tablet Take 0.5 tablets (30 mg total) by mouth 2 (two) times daily.  . fluticasone (FLONASE) 50 MCG/ACT nasal spray Place 1 spray into both nostrils daily. Use 1-2 sprays daily.  . fluticasone (FLOVENT HFA) 110 MCG/ACT inhaler Inhale 2 puffs into the lungs 2 (two) times daily.  Marland Kitchen glucose blood (FREESTYLE LITE) test strip Use as directed once a daily Diag E11.65  . ipratropium-albuterol (DUONEB) 0.5-2.5 (3) MG/3ML SOLN Take 3 mLs by nebulization 3 (three) times daily as needed. Use 1 vial 3 times daily as needed for SOB dia j45.1  . Lancets (FREESTYLE) lancets Use as directed once daily diag e11.65  . levothyroxine (SYNTHROID, LEVOTHROID) 25 MCG tablet TAKE 1 DAILY BY MOUTH IN THE MORNING ON AN EMPTY STOMACH  . metoprolol tartrate (LOPRESSOR) 100 MG tablet Take 1.5 tablets twice daily.  . mometasone (ELOCON) 0.1 % lotion Apply 4 drops topically at bedtime as needed for itching.  . montelukast (SINGULAIR) 10 MG tablet TAKE 1 TABLET BY MOUTH EVERY DAY FOR COUGH  . olmesartan (BENICAR) 40 MG tablet Take 0.5 tablets (20 mg total) by mouth daily. Take 1 tablet if systolic pressure is greater than 160. Hold completely if systolic pressure is less than 120  . OXYGEN Inhale 2 L into the lungs every evening. Sleep with nasal cannula w/ O2 at 2lpm.  . rosuvastatin (CRESTOR) 5 MG tablet Take 1 tablet (5 mg total) by mouth  daily at 6 PM.  . sertraline (ZOLOFT) 100 MG tablet Take 1 tablet (100 mg total) by mouth daily.  Marland Kitchen olmesartan (BENICAR) 20 MG tablet Take one tab po qd   No facility-administered encounter medications on file as of 10/26/2018.     Surgical History: Past Surgical History:  Procedure Laterality Date  . BREAST CYST ASPIRATION Left 20+ yrs ago  . CATARACT EXTRACTION Bilateral 2005,2009  . cyst removal-right shoulder  1972  . otosclerosis Left 1988   hardening of bone, other 1989 redone  . THYROIDECTOMY  1970   nodule and 1/2  bridge removal  . WRIST SURGERY Left 11/29/2009    Medical History: Past Medical History:  Diagnosis Date  . Arthritis   . Asthma   . Cancer ([place])    skin  . Depression   . DVT (deep venous thrombosis) ([place])   . Hyperlipidemia   . Hypertension   . Phlebitis     Family History: Family History  Problem Relation Age of Onset  . Osteoarthritis Mother   . Depression Father   . Breast cancer Neg Hx     Social History   Socioeconomic History  . Marital status: Widowed    Spouse name: Not on file  . Number of children: Not on file  . Years of education: Not on file  . Highest education level: Not on file  Occupational History  . Not on file  Social Needs  . Financial resource strain: Not on file  . Food insecurity    Worry: Not on file    Inability: Not on file  . Transportation needs    Medical: Not on file    Non-medical: Not on file  Tobacco Use  . Smoking status: Never Smoker  . Smokeless tobacco: Never Used  Substance and Sexual Activity  . Alcohol use: No  . Drug use: No  . Sexual activity: Never  Lifestyle  . Physical activity    Days per week: Not on file    Minutes per session: Not on file  . Stress: Not on file  Relationships  . Social Herbalist on phone: Not on file    Gets together: Not on file    Attends religious service: Not on file    Active member of club or organization: Not on file    Attends meetings of clubs or organizations: Not on file    Relationship status: Not on file  . Intimate partner violence    Fear of current or ex partner: Not on file    Emotionally abused: Not on file    Physically abused: Not on file    Forced sexual activity: Not on file  Other Topics Concern  . Not on file  Social History Narrative  . Not on file   Review of Systems  Constitutional: Negative for chills, diaphoresis and fatigue.  HENT: Negative for ear pain, postnasal drip and sinus pressure.   Eyes: Negative for photophobia,  discharge, redness, itching and visual disturbance.  Respiratory: Positive for chest tightness. Negative for cough, shortness of breath and wheezing.   Cardiovascular: Negative for chest pain, palpitations and leg swelling.  Gastrointestinal: Negative for abdominal pain, constipation, diarrhea, nausea and vomiting.  Genitourinary: Negative for dysuria and flank pain.  Musculoskeletal: Negative for arthralgias, back pain, gait problem and neck pain.  Skin: Negative for color change.  Allergic/Immunologic: Negative for environmental allergies and food allergies.  Neurological: Negative for dizziness and headaches.  Hematological: Does not  bruise/bleed easily.  Psychiatric/Behavioral: Negative for agitation, behavioral problems (depression) and hallucinations.   Vital Signs: BP 115/61 Comment: 1st reading 139/88  Pulse 91 Comment: 1st reading 88  Ht 5' 2" (1.575 m)   Wt 128 lb (58.1 kg)   BMI 23.41 kg/m    Observation/Objective: Pt is pleasant to talk to, NAD  Assessment/Plan: 1. Paroxysmal atrial fibrillation (HCC) - Continue Metoprolol, low dose Cardizem 30 mg bid and Benicar 20 mg po qd. She is also on low dose Eliquis.    2. Essential (primary) hypertension - Controlled at this time   General Counseling: Daje verbalizes understanding of the findings of today's phone visit and agrees with plan of treatment. I have discussed any further diagnostic evaluation that may be needed or ordered today. We also reviewed her medications today. she has been encouraged to call the office with any questions or concerns that should arise related to todays visit.   Meds ordered this encounter  Medications  . olmesartan (BENICAR) 20 MG tablet    Sig: Take one tab po qd    Dispense:  30 tablet    Refill:  3    Time spent:15 Minutes   Dr Lavera Guise Internal medicine

## 2018-10-28 ENCOUNTER — Telehealth: Payer: Self-pay | Admitting: Cardiovascular Disease

## 2018-10-28 NOTE — Telephone Encounter (Signed)

## 2018-10-29 ENCOUNTER — Other Ambulatory Visit: Payer: Self-pay

## 2018-10-29 ENCOUNTER — Encounter: Payer: Self-pay | Admitting: Cardiovascular Disease

## 2018-10-29 ENCOUNTER — Telehealth: Payer: Self-pay | Admitting: Cardiovascular Disease

## 2018-10-29 ENCOUNTER — Ambulatory Visit (INDEPENDENT_AMBULATORY_CARE_PROVIDER_SITE_OTHER): Payer: Medicare Other | Admitting: Cardiovascular Disease

## 2018-10-29 VITALS — BP 132/66 | HR 145 | Temp 98.5°F | Ht 62.0 in | Wt 129.0 lb

## 2018-10-29 DIAGNOSIS — I6523 Occlusion and stenosis of bilateral carotid arteries: Secondary | ICD-10-CM

## 2018-10-29 DIAGNOSIS — I4892 Unspecified atrial flutter: Secondary | ICD-10-CM | POA: Diagnosis not present

## 2018-10-29 MED ORDER — DILTIAZEM HCL ER 180 MG PO CP24: 180.0000 mg | ORAL_CAPSULE | Freq: Every day | ORAL | 5 refills | Status: DC

## 2018-10-29 NOTE — Telephone Encounter (Signed)
Pt c/o medication issue:  1. Name of Medication: Benicar and cardizem   2. How are you currently taking this medication (dosage and times per day)? Not sure what to take tonight   3. Are you having a reaction (difficulty breathing--STAT)?  No   4. What is your medication issue? Daughter calling to confirm meds dose for tonight not sure if she should give or hold   Please call

## 2018-10-29 NOTE — Progress Notes (Signed)
Cardiology Office Note   Date:  10/29/2018   ID:  Ashley Weiss, DOB 03/23/1920, MRN [id number]  PCP:  Lavera Guise, MD  Cardiologist:   Kathlyn Sacramento, MD   Chief Complaint  Patient presents with  . other    Ref by Dr. Clayborn Bigness for a cardiac evaluation for SSS and A-Fib. Meds reviewed by the pt.'s daughter. Pt.c/o shortness of breath and chest discomfort.       History of Present Illness: Ashley Weiss is a 85 y.o. female who was referred by Dr.Khan for evaluation and management of atrial fibrillation and possible tachybradycardia syndrome. She has history of hyperlipidemia, essential hypertension, hyperlipidemia and paroxysmal atrial fibrillation.  She reports having atrial fibrillation for many years it seems from her heart rate readings that she is mostly in sinus rhythm with intermittent palpitations. She presented to [facility] on June 28 with dizziness and weakness.  She was found to be in A. fib with RVR with a heart rate of 146 bpm.  She was given IV metoprolol, digoxin and diltiazem with improvement in heart rate.  She has been on metoprolol 150 mg twice daily in addition to digoxin 0.25 mg once daily for many years.  Diltiazem was added during hospitalization in addition to low-dose Eliquis for anticoagulation.  She underwent an echocardiogram which showed normal LV systolic function with moderately dilated left atrium, mild aortic stenosis, mild to moderate tricuspid regurgitation and mild pulmonary hypertension. She is normally very active and independent in spite of her age.  However, since she started dealing with this tachycardia, she has been having significant shortness of breath and fatigue.  Past Medical History:  Diagnosis Date  . Arthritis   . Asthma   . Cancer ([place])    skin  . Depression   . DVT (deep venous thrombosis) ([place])   . Hyperlipidemia   . Hypertension   . Phlebitis     Past Surgical History:  Procedure Laterality Date  . BREAST CYST  ASPIRATION Left 20+ yrs ago  . CATARACT EXTRACTION Bilateral 2005,2009  . cyst removal-right shoulder  1972  . otosclerosis Left 1988   hardening of bone, other 1989 redone  . THYROIDECTOMY  1970   nodule and 1/2 bridge removal  . WRIST SURGERY Left 11/29/2009     Current Outpatient Medications  Medication Sig Dispense Refill  . albuterol (PROVENTIL HFA;VENTOLIN HFA) 108 (90 Base) MCG/ACT inhaler Inhale 2 puffs into the lungs every 6 (six) hours as needed for wheezing or shortness of breath. 1 Inhaler 5  . apixaban (ELIQUIS) 2.5 MG TABS tablet Take 1 tablet (2.5 mg total) by mouth 2 (two) times daily. 60 tablet 2  . Blood Glucose Monitoring Suppl (FREESTYLE FREEDOM LITE) w/Device KIT 1 kit by Does not apply route daily. 1 each 0  . denosumab (PROLIA) 60 MG/ML SOSY injection Inject 60 mg into the skin every 6 (six) months.    . diltiazem (CARDIZEM) 60 MG tablet Take 0.5 tablets (30 mg total) by mouth 2 (two) times daily. 60 tablet 1  . fluticasone (FLONASE) 50 MCG/ACT nasal spray Place 1 spray into both nostrils daily. Use 1-2 sprays daily. 16 g 3  . fluticasone (FLOVENT HFA) 110 MCG/ACT inhaler Inhale 2 puffs into the lungs 2 (two) times daily. 3 Inhaler 3  . glucose blood (FREESTYLE LITE) test strip Use as directed once a daily Diag E11.65 100 each 12  . ipratropium-albuterol (DUONEB) 0.5-2.5 (3) MG/3ML SOLN Take 3 mLs by nebulization  3 (three) times daily as needed. Use 1 vial 3 times daily as needed for SOB dia j45.1 810 mL 0  . Lancets (FREESTYLE) lancets Use as directed once daily diag e11.65 100 each 12  . levothyroxine (SYNTHROID, LEVOTHROID) 25 MCG tablet TAKE 1 DAILY BY MOUTH IN THE MORNING ON AN EMPTY STOMACH 90 tablet 1  . metoprolol tartrate (LOPRESSOR) 100 MG tablet Take 1.5 tablets twice daily. 270 tablet 1  . mometasone (ELOCON) 0.1 % lotion Apply 4 drops topically at bedtime as needed for itching.    . montelukast (SINGULAIR) 10 MG tablet TAKE 1 TABLET BY MOUTH EVERY DAY FOR  COUGH 90 tablet 2  . olmesartan (BENICAR) 20 MG tablet Take one tab po qd 30 tablet 3  . OXYGEN Inhale 2 L into the lungs every evening. Sleep with nasal cannula w/ O2 at 2lpm.    . rosuvastatin (CRESTOR) 5 MG tablet Take 1 tablet (5 mg total) by mouth daily at 6 PM. 30 tablet 2  . sertraline (ZOLOFT) 100 MG tablet Take 1 tablet (100 mg total) by mouth daily. 90 tablet 2   No current facility-administered medications for this visit.     Allergies:   Hydralazine, Biaxin [clarithromycin], Ciprofloxacin, Meloxicam, and Minocycline    Social History:  The patient  reports that she has never smoked. She has never used smokeless tobacco. She reports that she does not drink alcohol or use drugs.   Family History:  The patient's family history includes Depression in her father; Osteoarthritis in her mother.    ROS:  Please see the history of present illness.   Otherwise, review of systems are positive for none.   All other systems are reviewed and negative.    PHYSICAL EXAM: VS:  BP 132/66 (BP Location: Right Arm, Patient Position: Sitting, Cuff Size: Normal)   Pulse (!) 145   Temp 98.5 F (36.9 C)   Ht 5' 2"  (1.575 m)   Wt 129 lb (58.5 kg)   SpO2 96%   BMI 23.59 kg/m  , BMI Body mass index is 23.59 kg/m. GEN: Well nourished, well developed, in no acute distress  HEENT: normal  Neck: no JVD, carotid bruits, or masses Cardiac: Irregularly irregular; no murmurs, rubs, or gallops,no edema  Respiratory:  clear to auscultation bilaterally, normal work of breathing GI: soft, nontender, nondistended, + BS MS: no deformity or atrophy  Skin: warm and dry, no rash Neuro:  Strength and sensation are intact Psych: euthymic mood, full affect   EKG:  EKG is ordered today. The ekg ordered today demonstrates atrial flutter with variable AV block.  Ventricular rate is ranging from 100 to 145 bpm.   Recent Labs: 10/09/2018: TSH 4.660 10/10/2018: ALT 21; BUN 17; Creatinine, Ser 0.48; Hemoglobin  13.3; Platelets 213; Potassium 4.3; Sodium 138    Lipid Panel    Component Value Date/Time   CHOL 119 10/09/2018 0848   TRIG 121 10/09/2018 0848   HDL 38 (L) 10/09/2018 0848   LDLCALC 57 10/09/2018 0848      Wt Readings from Last 3 Encounters:  10/29/18 129 lb (58.5 kg)  10/26/18 128 lb (58.1 kg)  10/19/18 128 lb 3.2 oz (58.2 kg)      PAD Screen 10/29/2018  Previous PAD dx? No  Previous surgical procedure? No  Pain with walking? No  Feet/toe relief with dangling? No  Painful, non-healing ulcers? No  Extremities discolored? No      ASSESSMENT AND PLAN:  1.  Paroxysmal atrial fibrillation/flutter:  Based on her heart rate readings, I suspect that this is paroxysmal and not persistent.  Digoxin was recently stopped given her age and concern about possible bradycardia. Ventricular rate is not well controlled.  Thus, I stopped olmesartan and started the patient on diltiazem extended release 180 mg once daily.  This will be used in addition to metoprolol tartrate 150 mg twice daily which she has been on for many years. If we are not able to control ventricular rate on this regimen, the plan is to switch her from diltiazem to amiodarone and proceed with attempted cardioversion after 3 to 4 weeks of anticoagulation.  Seems that Eliquis was started on June 21.  This is the appropriate dose given her age and weight below 60 kg. If in the other hand she develops significant bradycardia, a pacemaker might be needed in order to be able to effectively treat her atrial fibrillation.  2.  Essential hypertension: I discontinued olmesartan in order to increase diltiazem as outlined above.    Disposition:   FU with me in 1 week  Signed,  Kathlyn Sacramento, MD  10/29/2018 12:12 PM    [place]

## 2018-10-29 NOTE — Telephone Encounter (Signed)
Spoke with the pt daughter. Pt daughter sts that Dr.Arida instructed her to have the pt take Cardizem 60mg  this evening and the stop and to stop Benicar and she was calling back to confirm that is what they are suppose to do.  Adv her that he did not specify that in his documentation, but I agree with the instructions. Pt  Adv the pt daughter that she can start the Diltiazem XR tomorrow and continue the Metoprolol at the current dose. Pt daughter verbalized understanding and voiced appreciation.

## 2018-10-29 NOTE — Patient Instructions (Signed)
Medication Instructions:  Your physician has recommended you make the following change in your medication:  1) STOP Olmesartan (Benicar) 2) STOP Diltiazem (Cardizem) 3) START Diltiazem ER 180mg  daily If you need a refill on your cardiac medications before your next appointment, please call your pharmacy.   Lab work: None ordered If you have labs (blood work) drawn today and your tests are completely normal, you will receive your results only by: Marland Kitchen MyChart Message (if you have MyChart) OR . A paper copy in the mail If you have any lab test that is abnormal or we need to change your treatment, we will call you to review the results.  Testing/Procedures: None ordered  Follow-Up: At [facility], you and your health needs are our priority.  As part of our continuing mission to provide you with exceptional heart care, we have created designated Provider Care Teams.  These Care Teams include your primary Cardiologist (physician) and Advanced Practice Providers (APPs -  Physician Assistants and Nurse Practitioners) who all work together to provide you with the care you need, when you need it. You have a follow up appointment schedule with Dr.Arida on 11/05/18 @ 12pm

## 2018-11-03 ENCOUNTER — Telehealth: Payer: Self-pay | Admitting: Internal Medicine

## 2018-11-03 ENCOUNTER — Telehealth: Payer: Self-pay

## 2018-11-03 NOTE — Telephone Encounter (Signed)

## 2018-11-03 NOTE — Telephone Encounter (Signed)
Received call from patient's daughter about whether or not it would be safe to administer "pill-in-pocket" diltiazem 30mg  as patient's heart rates were in the 130s. She had been instructed that if her mother's blood pressures were low to hold additional doses. Patient's BP at was in the 833A systolic and she was in bed getting ready to sleep. I informed them that it would be safe to give as long as her SBP was > 110 mmHg. They voiced understanding. Patient is set to follow-up with cardiology on 7/16.

## 2018-11-05 ENCOUNTER — Encounter: Payer: Self-pay | Admitting: Cardiovascular Disease

## 2018-11-05 ENCOUNTER — Other Ambulatory Visit: Payer: Self-pay

## 2018-11-05 ENCOUNTER — Ambulatory Visit (INDEPENDENT_AMBULATORY_CARE_PROVIDER_SITE_OTHER): Payer: Medicare Other | Admitting: Cardiovascular Disease

## 2018-11-05 VITALS — BP 120/80 | HR 126 | Temp 98.4°F | Ht 62.0 in | Wt 130.0 lb

## 2018-11-05 DIAGNOSIS — I10 Essential (primary) hypertension: Secondary | ICD-10-CM

## 2018-11-05 DIAGNOSIS — I6523 Occlusion and stenosis of bilateral carotid arteries: Secondary | ICD-10-CM

## 2018-11-05 DIAGNOSIS — I4892 Unspecified atrial flutter: Secondary | ICD-10-CM

## 2018-11-05 MED ORDER — APIXABAN 2.5 MG PO TABS: 2.5000 mg | ORAL_TABLET | Freq: Two times a day (BID) | ORAL | 3 refills | Status: DC

## 2018-11-05 NOTE — Progress Notes (Signed)
Cardiology Office Note   Date:  11/05/2018   ID:  Ashley Weiss, DOB 1919/07/01, MRN [id number]  PCP:  Lavera Guise, MD  Cardiologist:   Kathlyn Sacramento, MD   Chief Complaint  Patient presents with  . other    1 week follow up; A-flutter. Meds reviewed by the pt. verbally. Pt. c/o spells of A-Fib.       History of Present Illness: Ashley Weiss is a 85 y.o. female who is here today for follow-up visit regarding atrial fibrillation  and possible tachybradycardia syndrome. She has history of hyperlipidemia, essential hypertension, hyperlipidemia and paroxysmal atrial fibrillation.  She reports having atrial fibrillation for many years it seems from her heart rate readings that she is mostly in sinus rhythm with intermittent palpitations. She presented to [facility] on June 28 with dizziness and weakness.  She was found to be in A. fib with RVR with a heart rate of 146 bpm.  She was given IV metoprolol, digoxin and diltiazem with improvement in heart rate.  She has been on metoprolol 150 mg twice daily in addition to digoxin 0.25 mg once daily for many years.  Diltiazem was added during hospitalization in addition to low-dose Eliquis for anticoagulation.  She underwent an echocardiogram which showed normal LV systolic function with moderately dilated left atrium, mild aortic stenosis, mild to moderate tricuspid regurgitation and mild pulmonary hypertension.  Digoxin was subsequently discontinued and the dose of diltiazem was increased during last visit.  In addition, I discontinued Benicar to allow increasing diltiazem.  There was slight improvement in heart rate but for the most part, she continued to be tachycardic.  Short acting diltiazem was added over the phone to be used as needed.  Fortunately, she denies chest pain or worsening dyspnea.   Past Medical History:  Diagnosis Date  . Arthritis   . Asthma   . Cancer ([place])    skin  . Depression   . DVT (deep venous thrombosis) ([place])    . Hyperlipidemia   . Hypertension   . Phlebitis     Past Surgical History:  Procedure Laterality Date  . BREAST CYST ASPIRATION Left 20+ yrs ago  . CATARACT EXTRACTION Bilateral 2005,2009  . cyst removal-right shoulder  1972  . otosclerosis Left 1988   hardening of bone, other 1989 redone  . THYROIDECTOMY  1970   nodule and 1/2 bridge removal  . WRIST SURGERY Left 11/29/2009     Current Outpatient Medications  Medication Sig Dispense Refill  . albuterol (PROVENTIL HFA;VENTOLIN HFA) 108 (90 Base) MCG/ACT inhaler Inhale 2 puffs into the lungs every 6 (six) hours as needed for wheezing or shortness of breath. 1 Inhaler 5  . apixaban (ELIQUIS) 2.5 MG TABS tablet Take 1 tablet (2.5 mg total) by mouth 2 (two) times daily. 60 tablet 2  . Blood Glucose Monitoring Suppl (FREESTYLE FREEDOM LITE) w/Device KIT 1 kit by Does not apply route daily. 1 each 0  . denosumab (PROLIA) 60 MG/ML SOSY injection Inject 60 mg into the skin every 6 (six) months.    . diltiazem (DILACOR XR) 180 MG 24 hr capsule Take 1 capsule (180 mg total) by mouth daily. 30 capsule 5  . fluticasone (FLONASE) 50 MCG/ACT nasal spray Place 1 spray into both nostrils daily. Use 1-2 sprays daily. 16 g 3  . fluticasone (FLOVENT HFA) 110 MCG/ACT inhaler Inhale 2 puffs into the lungs 2 (two) times daily. 3 Inhaler 3  . glucose blood (FREESTYLE LITE) test  strip Use as directed once a daily Diag E11.65 100 each 12  . ipratropium-albuterol (DUONEB) 0.5-2.5 (3) MG/3ML SOLN Take 3 mLs by nebulization 3 (three) times daily as needed. Use 1 vial 3 times daily as needed for SOB dia j45.1 810 mL 0  . Lancets (FREESTYLE) lancets Use as directed once daily diag e11.65 100 each 12  . levothyroxine (SYNTHROID, LEVOTHROID) 25 MCG tablet TAKE 1 DAILY BY MOUTH IN THE MORNING ON AN EMPTY STOMACH 90 tablet 1  . metoprolol tartrate (LOPRESSOR) 100 MG tablet Take 1.5 tablets twice daily. 270 tablet 1  . mometasone (ELOCON) 0.1 % lotion Apply 4 drops  topically at bedtime as needed for itching.    . montelukast (SINGULAIR) 10 MG tablet TAKE 1 TABLET BY MOUTH EVERY DAY FOR COUGH 90 tablet 2  . OXYGEN Inhale 2 L into the lungs every evening. Sleep with nasal cannula w/ O2 at 2lpm.    . rosuvastatin (CRESTOR) 5 MG tablet Take 1 tablet (5 mg total) by mouth daily at 6 PM. 30 tablet 2  . sertraline (ZOLOFT) 100 MG tablet Take 1 tablet (100 mg total) by mouth daily. 90 tablet 2   No current facility-administered medications for this visit.     Allergies:   Hydralazine, Biaxin [clarithromycin], Ciprofloxacin, Meloxicam, and Minocycline    Social History:  The patient  reports that she has never smoked. She has never used smokeless tobacco. She reports that she does not drink alcohol or use drugs.   Family History:  The patient's family history includes Depression in her father; Osteoarthritis in her mother.    ROS:  Please see the history of present illness.   Otherwise, review of systems are positive for none.   All other systems are reviewed and negative.    PHYSICAL EXAM: VS:  Temp 98.4 F (36.9 C)   Ht 5' 2"  (1.575 m)   Wt 130 lb (59 kg)   BMI 23.78 kg/m  , BMI Body mass index is 23.78 kg/m. GEN: Well nourished, well developed, in no acute distress  HEENT: normal  Neck: no JVD, carotid bruits, or masses Cardiac: Irregularly irregular and tachycardic; no murmurs, rubs, or gallops,no edema  Respiratory:  clear to auscultation bilaterally, normal work of breathing GI: soft, nontender, nondistended, + BS MS: no deformity or atrophy  Skin: warm and dry, no rash Neuro:  Strength and sensation are intact Psych: euthymic mood, full affect   EKG:  EKG is ordered today. The ekg ordered today demonstrates atrial flutter with variable AV block.  Ventricular rate is ranging from 100 to 145 bpm.   Recent Labs: 10/09/2018: TSH 4.660 10/10/2018: ALT 21; BUN 17; Creatinine, Ser 0.48; Hemoglobin 13.3; Platelets 213; Potassium 4.3; Sodium  138    Lipid Panel    Component Value Date/Time   CHOL 119 10/09/2018 0848   TRIG 121 10/09/2018 0848   HDL 38 (L) 10/09/2018 0848   LDLCALC 57 10/09/2018 0848      Wt Readings from Last 3 Encounters:  11/05/18 130 lb (59 kg)  10/29/18 129 lb (58.5 kg)  10/26/18 128 lb (58.1 kg)      PAD Screen 10/29/2018  Previous PAD dx? No  Previous surgical procedure? No  Pain with walking? No  Feet/toe relief with dangling? No  Painful, non-healing ulcers? No  Extremities discolored? No      ASSESSMENT AND PLAN:  1.  Paroxysmal atrial fibrillation/flutter: Based on her heart rate readings, I suspect that this is paroxysmal and  not permanent.  Recent EKGs all showed atrial flutter not atrial fibrillation which might explain why her ventricular rate is difficult to control.   I elected not to increase diltiazem any further given that the risk of bradycardia is going to be higher.  I think the best option is to proceed with cardioversion.  I discussed the procedure in details as well as risks and benefits.  Continue Eliquis without interruption. Hold metoprolol and diltiazem the morning of cardioversion.  2.  Essential hypertension: Blood pressure is controlled even after stopping Benicar.    Disposition:   FU with me in 1 month.  Signed,  Kathlyn Sacramento, MD  11/05/2018 10:56 AM    [place]

## 2018-11-05 NOTE — Patient Instructions (Signed)
Medication Instructions:  Your physician recommends that you continue on your current medications as directed. Please refer to the Current Medication list given to you today.  If you need a refill on your cardiac medications before your next appointment, please call your pharmacy.   Lab work: Art gallery manager  And [employer] will need a COVID-19 test prior to the procedure please plan on having it on Thurs 11/12/18 You will need to dive through [place] arts drive up testing site.  If you have labs (blood work) drawn today and your tests are completely normal, you will receive your results only by: Marland Kitchen MyChart Message (if you have MyChart) OR . A paper copy in the mail If you have any lab test that is abnormal or we need to change your treatment, we will call you to review the results.  Testing/Procedures: Your physician has recommended that you have a Cardioversion (DCCV). Electrical Cardioversion uses a jolt of electricity to your heart either through paddles or wired patches attached to your chest. This is a controlled, usually prescheduled, procedure. Defibrillation is done under light anesthesia in the hospital, and you usually go home the day of the procedure. This is done to get your heart back into a normal rhythm. You are not awake for the procedure. Please see the instruction sheet given to you today.    Follow-Up: At [facility], you and your health needs are our priority.  As part of our continuing mission to provide you with exceptional heart care, we have created designated Provider Care Teams.  These Care Teams include your primary Cardiologist (physician) and Advanced Practice Providers (APPs -  Physician Assistants and Nurse Practitioners) who all work together to provide you with the care you need, when you need it. You will need a follow up appointment in 4 weeks. Dr.Arida or one of the following Advanced Practice Providers on your designated Care Team:   Murray Hodgkins,  NP Christell Faith, PA-C . Marrianne Mood, PA-C  Any Other Special Instructions Will Be Listed Below (If Applicable).  You are scheduled for a Cardioversion on _7/27/20_______________ with Dr. Arida___________ Please arrive at the [place] [facility] at _________ a.m. on the day of your procedure.  DIET INSTRUCTIONS:  Nothing to eat or drink after midnight except your medications with a              sip of water.         1) Labs: _Bmet and Cbc today. You will need a COVID test on 7/23/20_________________  2) Medications: Please HOLD Diltiazem and Metoprolol the morning of the procedure. You may take  your remaining medications with a small amount of water.  3) Must have a responsible person to drive you home.  4) Bring a current list of your medications and current insurance cards.    If you have any questions after you get home, please call the office at [phone]

## 2018-11-06 LAB — BASIC METABOLIC PANEL
BUN/Creatinine Ratio: 30 — ABNORMAL HIGH (ref 12–28)
BUN: 15 mg/dL (ref 10–36)
CO2: 25 mmol/L (ref 20–29)
Calcium: 9.1 mg/dL (ref 8.7–10.3)
Chloride: 94 mmol/L — ABNORMAL LOW (ref 96–106)
Creatinine, Ser: 0.5 mg/dL — ABNORMAL LOW (ref 0.57–1.00)
GFR calc Af Amer: 94 mL/min/{1.73_m2} (ref >59)
GFR calc non Af Amer: 82 mL/min/{1.73_m2} (ref >59)
Glucose: 143 mg/dL — ABNORMAL HIGH (ref 65–99)
Potassium: 4.5 mmol/L (ref 3.5–5.2)
Sodium: 136 mmol/L (ref 134–144)

## 2018-11-06 LAB — CBC WITH DIFFERENTIAL/PLATELET
Basophils Absolute: 0.1 10*3/uL (ref 0.0–0.2)
Basos: 1 %
EOS (ABSOLUTE): 0.3 10*3/uL (ref 0.0–0.4)
Eos: 3 %
Hematocrit: 37.6 % (ref 34.0–46.6)
Hemoglobin: 12.2 g/dL (ref 11.1–15.9)
Immature Grans (Abs): 0 10*3/uL (ref 0.0–0.1)
Immature Granulocytes: 0 %
Lymphocytes Absolute: 1.3 10*3/uL (ref 0.7–3.1)
Lymphs: 15 %
MCH: 32.2 pg (ref 26.6–33.0)
MCHC: 32.4 g/dL (ref 31.5–35.7)
MCV: 99 fL — ABNORMAL HIGH (ref 79–97)
Monocytes Absolute: 0.8 10*3/uL (ref 0.1–0.9)
Monocytes: 9 %
Neutrophils Absolute: 6.4 10*3/uL (ref 1.4–7.0)
Neutrophils: 72 %
Platelets: 228 10*3/uL (ref 150–450)
RBC: 3.79 x10E6/uL (ref 3.77–5.28)
RDW: 12.4 % (ref 11.7–15.4)
WBC: 8.8 10*3/uL (ref 3.4–10.8)

## 2018-11-09 ENCOUNTER — Telehealth: Payer: Self-pay | Admitting: Cardiovascular Disease

## 2018-11-09 NOTE — Telephone Encounter (Signed)
Returned call to daughter who reports elevated HR, per chart HR has been elevated since last OV 7/16.   Pt is scheduled for cardioversion next week 7/27.  Last night o2 [place] fell out at some point. HR was 141 upon waking, took diltiazem as ordered daily and took PRN Cardizem (not on med list) 0.5 tab (30 mg total) around 0930.  11:20 AM HR 125 3:30 PM BP 105/81, HR 68 (daughter questions accuracy) 4 pm BP 111/75, HR 124.  Daughter reports that she is feeling well and has no complaints or swelling. No recent illness, fever or cough.   HR yesterday 94-135.   Daughter wants to insure there is nothing else they should be doing outside of current plan.   Routed to Dr. Fletcher Anon to further advise.

## 2018-11-09 NOTE — Telephone Encounter (Signed)
Patient woke up with her nasal canula out of her nose (uses a concentrator at night), throughout the day her HR has been fluctuating (has a fib and a flutter) today it has been up to the 140's. Patient has not gone out in the heat. Patient's O2 stats have been 91-95% this afternoon.Patient is not experiencing swelling or SOB at this time.  Patients daughter calling in for advisement because she is scheduled her an upcoming procedure.

## 2018-11-10 ENCOUNTER — Emergency Department: Payer: Medicare Other

## 2018-11-10 ENCOUNTER — Inpatient Hospital Stay
Admission: EM | Admit: 2018-11-10 | Discharge: 2018-11-16 | DRG: 308 | Disposition: A | Discharge status: Home Health | Payer: Medicare Other | Attending: Internal Medicine | Admitting: Internal Medicine

## 2018-11-10 ENCOUNTER — Other Ambulatory Visit: Payer: Self-pay

## 2018-11-10 ENCOUNTER — Telehealth: Payer: Self-pay

## 2018-11-10 DIAGNOSIS — J449 Chronic obstructive pulmonary disease, unspecified: Secondary | ICD-10-CM | POA: Diagnosis present

## 2018-11-10 DIAGNOSIS — R062 Wheezing: Secondary | ICD-10-CM | POA: Diagnosis not present

## 2018-11-10 DIAGNOSIS — I480 Paroxysmal atrial fibrillation: Secondary | ICD-10-CM | POA: Diagnosis not present

## 2018-11-10 DIAGNOSIS — E119 Type 2 diabetes mellitus without complications: Secondary | ICD-10-CM | POA: Diagnosis not present

## 2018-11-10 DIAGNOSIS — R0689 Other abnormalities of breathing: Secondary | ICD-10-CM | POA: Diagnosis not present

## 2018-11-10 DIAGNOSIS — Z85828 Personal history of other malignant neoplasm of skin: Secondary | ICD-10-CM

## 2018-11-10 DIAGNOSIS — I110 Hypertensive heart disease with heart failure: Secondary | ICD-10-CM | POA: Diagnosis present

## 2018-11-10 DIAGNOSIS — I5031 Acute diastolic (congestive) heart failure: Secondary | ICD-10-CM | POA: Diagnosis not present

## 2018-11-10 DIAGNOSIS — Z888 Allergy status to other drugs, medicaments and biological substances status: Secondary | ICD-10-CM | POA: Diagnosis not present

## 2018-11-10 DIAGNOSIS — Z9981 Dependence on supplemental oxygen: Secondary | ICD-10-CM | POA: Diagnosis not present

## 2018-11-10 DIAGNOSIS — I471 Supraventricular tachycardia: Secondary | ICD-10-CM | POA: Diagnosis not present

## 2018-11-10 DIAGNOSIS — Y92009 Unspecified place in unspecified non-institutional (private) residence as the place of occurrence of the external cause: Secondary | ICD-10-CM

## 2018-11-10 DIAGNOSIS — Z818 Family history of other mental and behavioral disorders: Secondary | ICD-10-CM | POA: Diagnosis not present

## 2018-11-10 DIAGNOSIS — F329 Major depressive disorder, single episode, unspecified: Secondary | ICD-10-CM | POA: Diagnosis present

## 2018-11-10 DIAGNOSIS — Z9842 Cataract extraction status, left eye: Secondary | ICD-10-CM

## 2018-11-10 DIAGNOSIS — M1990 Unspecified osteoarthritis, unspecified site: Secondary | ICD-10-CM | POA: Diagnosis present

## 2018-11-10 DIAGNOSIS — Z86718 Personal history of other venous thrombosis and embolism: Secondary | ICD-10-CM | POA: Diagnosis not present

## 2018-11-10 DIAGNOSIS — R0602 Shortness of breath: Secondary | ICD-10-CM | POA: Diagnosis not present

## 2018-11-10 DIAGNOSIS — Z79899 Other long term (current) drug therapy: Secondary | ICD-10-CM

## 2018-11-10 DIAGNOSIS — I361 Nonrheumatic tricuspid (valve) insufficiency: Secondary | ICD-10-CM | POA: Diagnosis not present

## 2018-11-10 DIAGNOSIS — W19XXXA Unspecified fall, initial encounter: Secondary | ICD-10-CM | POA: Diagnosis present

## 2018-11-10 DIAGNOSIS — Z9181 History of falling: Secondary | ICD-10-CM

## 2018-11-10 DIAGNOSIS — I4891 Unspecified atrial fibrillation: Secondary | ICD-10-CM | POA: Diagnosis not present

## 2018-11-10 DIAGNOSIS — I5033 Acute on chronic diastolic (congestive) heart failure: Secondary | ICD-10-CM | POA: Diagnosis present

## 2018-11-10 DIAGNOSIS — Z881 Allergy status to other antibiotic agents status: Secondary | ICD-10-CM | POA: Diagnosis not present

## 2018-11-10 DIAGNOSIS — J90 Pleural effusion, not elsewhere classified: Secondary | ICD-10-CM

## 2018-11-10 DIAGNOSIS — I10 Essential (primary) hypertension: Secondary | ICD-10-CM | POA: Diagnosis not present

## 2018-11-10 DIAGNOSIS — M810 Age-related osteoporosis without current pathological fracture: Secondary | ICD-10-CM | POA: Diagnosis present

## 2018-11-10 DIAGNOSIS — R079 Chest pain, unspecified: Secondary | ICD-10-CM | POA: Diagnosis not present

## 2018-11-10 DIAGNOSIS — Z79890 Hormone replacement therapy (postmenopausal): Secondary | ICD-10-CM | POA: Diagnosis not present

## 2018-11-10 DIAGNOSIS — Z66 Do not resuscitate: Secondary | ICD-10-CM | POA: Diagnosis present

## 2018-11-10 DIAGNOSIS — J4520 Mild intermittent asthma, uncomplicated: Secondary | ICD-10-CM | POA: Diagnosis present

## 2018-11-10 DIAGNOSIS — Z8672 Personal history of thrombophlebitis: Secondary | ICD-10-CM

## 2018-11-10 DIAGNOSIS — Z20828 Contact with and (suspected) exposure to other viral communicable diseases: Secondary | ICD-10-CM | POA: Diagnosis present

## 2018-11-10 DIAGNOSIS — R000 Tachycardia, unspecified: Secondary | ICD-10-CM | POA: Diagnosis not present

## 2018-11-10 DIAGNOSIS — J9621 Acute and chronic respiratory failure with hypoxia: Secondary | ICD-10-CM

## 2018-11-10 DIAGNOSIS — Z9841 Cataract extraction status, right eye: Secondary | ICD-10-CM

## 2018-11-10 DIAGNOSIS — E039 Hypothyroidism, unspecified: Secondary | ICD-10-CM | POA: Diagnosis not present

## 2018-11-10 DIAGNOSIS — I272 Pulmonary hypertension, unspecified: Secondary | ICD-10-CM | POA: Diagnosis present

## 2018-11-10 DIAGNOSIS — E785 Hyperlipidemia, unspecified: Secondary | ICD-10-CM | POA: Diagnosis present

## 2018-11-10 DIAGNOSIS — E876 Hypokalemia: Secondary | ICD-10-CM | POA: Diagnosis not present

## 2018-11-10 DIAGNOSIS — Z7901 Long term (current) use of anticoagulants: Secondary | ICD-10-CM

## 2018-11-10 DIAGNOSIS — I083 Combined rheumatic disorders of mitral, aortic and tricuspid valves: Secondary | ICD-10-CM | POA: Diagnosis present

## 2018-11-10 DIAGNOSIS — J811 Chronic pulmonary edema: Secondary | ICD-10-CM | POA: Diagnosis not present

## 2018-11-10 DIAGNOSIS — I4892 Unspecified atrial flutter: Secondary | ICD-10-CM | POA: Diagnosis present

## 2018-11-10 DIAGNOSIS — I509 Heart failure, unspecified: Secondary | ICD-10-CM | POA: Diagnosis not present

## 2018-11-10 DIAGNOSIS — I340 Nonrheumatic mitral (valve) insufficiency: Secondary | ICD-10-CM | POA: Diagnosis not present

## 2018-11-10 LAB — CBC WITH DIFFERENTIAL/PLATELET
Abs Immature Granulocytes: 0.03 10*3/uL (ref 0.00–0.07)
Basophils Absolute: 0.1 10*3/uL (ref 0.0–0.1)
Basophils Relative: 1 %
Eosinophils Absolute: 0.4 10*3/uL (ref 0.0–0.5)
Eosinophils Relative: 4 %
HCT: 37 % (ref 36.0–46.0)
Hemoglobin: 11.9 g/dL — ABNORMAL LOW (ref 12.0–15.0)
Immature Granulocytes: 0 %
Lymphocytes Relative: 17 %
Lymphs Abs: 1.6 10*3/uL (ref 0.7–4.0)
MCH: 32.1 pg (ref 26.0–34.0)
MCHC: 32.2 g/dL (ref 30.0–36.0)
MCV: 99.7 fL (ref 80.0–100.0)
Monocytes Absolute: 0.8 10*3/uL (ref 0.1–1.0)
Monocytes Relative: 8 %
Neutro Abs: 6.4 10*3/uL (ref 1.7–7.7)
Neutrophils Relative %: 70 %
Platelets: 227 10*3/uL (ref 150–400)
RBC: 3.71 MIL/uL — ABNORMAL LOW (ref 3.87–5.11)
RDW: 14 % (ref 11.5–15.5)
WBC: 9.2 10*3/uL (ref 4.0–10.5)
nRBC: 0 % (ref 0.0–0.2)

## 2018-11-10 LAB — COMPREHENSIVE METABOLIC PANEL
ALT: 56 U/L — ABNORMAL HIGH (ref 0–44)
AST: 47 U/L — ABNORMAL HIGH (ref 15–41)
Albumin: 3.5 g/dL (ref 3.5–5.0)
Alkaline Phosphatase: 69 U/L (ref 38–126)
Anion gap: 11 (ref 5–15)
BUN: 19 mg/dL (ref 8–23)
CO2: 27 mmol/L (ref 22–32)
Calcium: 8.9 mg/dL (ref 8.9–10.3)
Chloride: 95 mmol/L — ABNORMAL LOW (ref 98–111)
Creatinine, Ser: 0.55 mg/dL (ref 0.44–1.00)
GFR calc Af Amer: >60 mL/min (ref >60)
GFR calc non Af Amer: >60 mL/min (ref >60)
Glucose, Bld: 142 mg/dL — ABNORMAL HIGH (ref 70–99)
Potassium: 4.5 mmol/L (ref 3.5–5.1)
Sodium: 133 mmol/L — ABNORMAL LOW (ref 135–145)
Total Bilirubin: 0.7 mg/dL (ref 0.3–1.2)
Total Protein: 7.9 g/dL (ref 6.5–8.1)

## 2018-11-10 LAB — URINALYSIS, ROUTINE W REFLEX MICROSCOPIC
Bacteria, UA: NONE SEEN
Bilirubin Urine: NEGATIVE
Glucose, UA: NEGATIVE mg/dL
Hgb urine dipstick: NEGATIVE
Ketones, ur: NEGATIVE mg/dL
Nitrite: NEGATIVE
Protein, ur: NEGATIVE mg/dL
Specific Gravity, Urine: 1.019 (ref 1.005–1.030)
pH: 6 (ref 5.0–8.0)

## 2018-11-10 LAB — SARS CORONAVIRUS 2 BY RT PCR (HOSPITAL ORDER, PERFORMED IN ~~LOC~~ HOSPITAL LAB): SARS Coronavirus 2: NEGATIVE

## 2018-11-10 LAB — PROCALCITONIN: Procalcitonin: <0.1 ng/mL

## 2018-11-10 LAB — BRAIN NATRIURETIC PEPTIDE: B Natriuretic Peptide: 312 pg/mL — ABNORMAL HIGH (ref 0.0–100.0)

## 2018-11-10 LAB — MAGNESIUM: Magnesium: 2.1 mg/dL (ref 1.7–2.4)

## 2018-11-10 MED ORDER — MONTELUKAST SODIUM 10 MG PO TABS
10.0000 mg | ORAL_TABLET | Freq: Every day | ORAL | Status: DC
  Administered 2018-11-12 – 2018-11-16 (×5): 10 mg via ORAL
  Filled 2018-11-10 (×5): qty 1

## 2018-11-10 MED ORDER — METOPROLOL TARTRATE 50 MG PO TABS
150.0000 mg | ORAL_TABLET | Freq: Two times a day (BID) | ORAL | Status: DC
  Administered 2018-11-11: 150 mg via ORAL
  Filled 2018-11-10: qty 3

## 2018-11-10 MED ORDER — ACETAMINOPHEN 325 MG PO TABS: 650.0000 mg | ORAL_TABLET | ORAL | Status: DC | PRN

## 2018-11-10 MED ORDER — IPRATROPIUM-ALBUTEROL 0.5-2.5 (3) MG/3ML IN SOLN
3.0000 mL | Freq: Once | RESPIRATORY_TRACT | Status: AC
  Administered 2018-11-10: 18:00:00 3 mL via RESPIRATORY_TRACT
  Filled 2018-11-10: qty 3

## 2018-11-10 MED ORDER — SODIUM CHLORIDE 0.9% FLUSH: 3.0000 mL | INTRAVENOUS | Status: DC | PRN

## 2018-11-10 MED ORDER — BUDESONIDE 0.5 MG/2ML IN SUSP
2.0000 mL | Freq: Two times a day (BID) | RESPIRATORY_TRACT | Status: DC
  Administered 2018-11-11 – 2018-11-16 (×11): 0.5 mg via RESPIRATORY_TRACT
  Filled 2018-11-10 (×11): qty 2

## 2018-11-10 MED ORDER — FUROSEMIDE 10 MG/ML IJ SOLN
20.0000 mg | Freq: Once | INTRAMUSCULAR | Status: AC
  Administered 2018-11-10: 20 mg via INTRAVENOUS
  Filled 2018-11-10: qty 4

## 2018-11-10 MED ORDER — LEVOTHYROXINE SODIUM 25 MCG PO TABS
25.0000 ug | ORAL_TABLET | Freq: Every day | ORAL | Status: DC
  Administered 2018-11-12 – 2018-11-16 (×5): 25 ug via ORAL
  Filled 2018-11-10 (×6): qty 1

## 2018-11-10 MED ORDER — DILTIAZEM HCL ER COATED BEADS 180 MG PO CP24
180.0000 mg | ORAL_CAPSULE | Freq: Once | ORAL | Status: AC
  Administered 2018-11-10: 180 mg via ORAL
  Filled 2018-11-10: qty 1

## 2018-11-10 MED ORDER — SODIUM CHLORIDE 0.9 % IV SOLN: 250.0000 mL | INTRAVENOUS | Status: DC | PRN

## 2018-11-10 MED ORDER — SODIUM CHLORIDE 0.9% FLUSH
3.0000 mL | Freq: Two times a day (BID) | INTRAVENOUS | Status: DC
  Administered 2018-11-12 – 2018-11-15 (×5): 3 mL via INTRAVENOUS

## 2018-11-10 MED ORDER — ROSUVASTATIN CALCIUM 5 MG PO TABS
5.0000 mg | ORAL_TABLET | Freq: Every day | ORAL | Status: DC
  Administered 2018-11-11 – 2018-11-15 (×5): 5 mg via ORAL
  Filled 2018-11-10 (×5): qty 1

## 2018-11-10 MED ORDER — ONDANSETRON HCL 4 MG/2ML IJ SOLN: 4.0000 mg | Freq: Four times a day (QID) | INTRAMUSCULAR | Status: DC | PRN

## 2018-11-10 MED ORDER — VITAMIN C 500 MG PO TABS
500.0000 mg | ORAL_TABLET | Freq: Every day | ORAL | Status: DC
  Administered 2018-11-12 – 2018-11-16 (×5): 500 mg via ORAL
  Filled 2018-11-10 (×5): qty 1

## 2018-11-10 MED ORDER — DILTIAZEM HCL 100 MG IV SOLR
5.0000 mg/h | INTRAVENOUS | Status: DC
  Filled 2018-11-10: qty 100

## 2018-11-10 MED ORDER — IPRATROPIUM-ALBUTEROL 0.5-2.5 (3) MG/3ML IN SOLN
3.0000 mL | Freq: Once | RESPIRATORY_TRACT | Status: AC
  Administered 2018-11-10: 3 mL via RESPIRATORY_TRACT
  Filled 2018-11-10: qty 3

## 2018-11-10 MED ORDER — SERTRALINE HCL 50 MG PO TABS
100.0000 mg | ORAL_TABLET | Freq: Every day | ORAL | Status: DC
  Administered 2018-11-12 – 2018-11-16 (×5): 100 mg via ORAL
  Filled 2018-11-10 (×5): qty 2

## 2018-11-10 MED ORDER — DILTIAZEM HCL ER COATED BEADS 180 MG PO CP24: 180.0000 mg | ORAL_CAPSULE | Freq: Every day | ORAL | Status: DC

## 2018-11-10 MED ORDER — APIXABAN 2.5 MG PO TABS
2.5000 mg | ORAL_TABLET | Freq: Two times a day (BID) | ORAL | Status: DC
  Administered 2018-11-11 – 2018-11-16 (×11): 2.5 mg via ORAL
  Filled 2018-11-10 (×11): qty 1

## 2018-11-10 NOTE — Telephone Encounter (Signed)
Agree with RN for patient to proceed to ED given persistent tachycardic heart rates into the 130s to 140s with O2 saturations in the low 80s with associated wheezing and productive cough.

## 2018-11-10 NOTE — ED Notes (Signed)
Pt given [place] sandwich tray and milk

## 2018-11-10 NOTE — Telephone Encounter (Signed)
To Christell Faith, PA to review and advise any further recommendations prior to scheduled DCCV on 11/16/18.

## 2018-11-10 NOTE — ED Notes (Signed)
Pt incontinent of urine, linens changed and pt cleaned up

## 2018-11-10 NOTE — Telephone Encounter (Signed)
Pt daughter called that pt is having shortness of breath  O2 is 78%room air and 95 %after 2 litre  its stay 80-82 when she was sleeping with 2 litre pt was not using before day time O2 only at nightime  her bp112/69,pulse 91 and temp 97.8 and pt was going for cardioversion on Monday as per dr Humphrey Rolls advised her called her cardiologist also and she need go to ED for shortness of breath

## 2018-11-10 NOTE — Telephone Encounter (Signed)
Spoke with the pt daughter [name]. [name] sts that the pt is asymptomatic but is sleeping a bit more. Adv her that the elevated HR could cause her to feel more fatigued. This should improve once she is cardioverted and hopefully remains in NSR. Greig Castilla to contact the office if symptoms develop. [name] verbalized understanding and voice appreciation for the call.

## 2018-11-10 NOTE — ED Triage Notes (Addendum)
Pt arrives via [facility] for SOB and states she has been having trouble getting her heart rate under control. Pt report SOB x 4 weeks and cough x 3 days. PT has hx of afib. Pt in NAD at this time and A&Ox4. Pt used albuterol inhaler at home today with no relief

## 2018-11-10 NOTE — ED Notes (Signed)
ED TO INPATIENT HANDOFF REPORT  ED Nurse Name and Phone #: Janett Billow 77   S Name/Age/Gender Ashley Weiss 85 y.o. female Room/Bed: ED12A/ED12A  Code Status   Code Status: Prior  Home/SNF/Other Home Patient oriented to: self, place, time and situation Is this baseline? Yes   Triage Complete: Triage complete  Chief Complaint Shob  Triage Note Pt arrives via [facility] for SOB and states she has been having trouble getting her heart rate under control. Pt report SOB x 4 weeks and cough x 3 days. PT has hx of afib. Pt in NAD at this time and A&Ox4. Pt used albuterol inhaler at home today with no relief   Allergies Allergies  Allergen Reactions  . Hydralazine Itching and Swelling  . Biaxin [Clarithromycin] Nausea And Vomiting  . Ciprofloxacin Other (See Comments)    Hand cramping   . Meloxicam Other (See Comments)    Unknown  . Minocycline Other (See Comments)    Unknown    Level of Care/Admitting Diagnosis ED Disposition    ED Disposition Condition [facility]: [place] [[id number]]  Level of Care: Telemetry [5]  Covid Evaluation: Confirmed COVID Negative  Diagnosis: Atrial fibrillation with RVR [facility]) [[id number]]  Admitting Physician: Mayer Camel [[id number]]  Attending Physician: Mayer Camel [[id number]]  Estimated length of stay: past midnight tomorrow  Certification:: I certify this patient will need inpatient services for at least 2 midnights  PT Class (Do Not Modify): Inpatient [101]  PT Acc Code (Do Not Modify): Private [1]       B Medical/Surgery History Past Medical History:  Diagnosis Date  . Arthritis   . Asthma   . Cancer ([place])    skin  . Depression   . DVT (deep venous thrombosis) ([place])   . Hyperlipidemia   . Hypertension   . Phlebitis    Past Surgical History:  Procedure Laterality Date  . BREAST CYST ASPIRATION Left 20+ yrs ago  . CATARACT EXTRACTION Bilateral 2005,2009  . cyst removal-right  shoulder  1972  . otosclerosis Left 1988   hardening of bone, other 1989 redone  . THYROIDECTOMY  1970   nodule and 1/2 bridge removal  . WRIST SURGERY Left 11/29/2009     A IV Location/Drains/Wounds Patient Lines/Drains/Airways Status   Active Line/Drains/Airways    Name:   Placement date:   Placement time:   Site:   Days:   Peripheral IV 11/10/18 Left Antecubital   11/10/18    1709    Antecubital   less than 1          Intake/Output Last 24 hours No intake or output data in the 24 hours ending 11/10/18 2237  Labs/Imaging Results for orders placed or performed during the hospital encounter of 11/10/18 (from the past 48 hour(s))  CBC with Differential/Platelet     Status: Abnormal   Collection Time: 11/10/18  5:46 PM  Result Value Ref Range   WBC 9.2 4.0 - 10.5 K/uL   RBC 3.71 (L) 3.87 - 5.11 MIL/uL   Hemoglobin 11.9 (L) 12.0 - 15.0 g/dL   HCT 37.0 36.0 - 46.0 %   MCV 99.7 80.0 - 100.0 fL   MCH 32.1 26.0 - 34.0 pg   MCHC 32.2 30.0 - 36.0 g/dL   RDW 14.0 11.5 - 15.5 %   Platelets 227 150 - 400 K/uL   nRBC 0.0 0.0 - 0.2 %   Neutrophils Relative % 70 %   Neutro  Abs 6.4 1.7 - 7.7 K/uL   Lymphocytes Relative 17 %   Lymphs Abs 1.6 0.7 - 4.0 K/uL   Monocytes Relative 8 %   Monocytes Absolute 0.8 0.1 - 1.0 K/uL   Eosinophils Relative 4 %   Eosinophils Absolute 0.4 0.0 - 0.5 K/uL   Basophils Relative 1 %   Basophils Absolute 0.1 0.0 - 0.1 K/uL   Immature Granulocytes 0 %   Abs Immature Granulocytes 0.03 0.00 - 0.07 K/uL    Comment: Performed at [facility], [address] [place], [place] [address]  Comprehensive metabolic panel     Status: Abnormal   Collection Time: 11/10/18  5:46 PM  Result Value Ref Range   Sodium 133 (L) 135 - 145 mmol/L   Potassium 4.5 3.5 - 5.1 mmol/L   Chloride 95 (L) 98 - 111 mmol/L   CO2 27 22 - 32 mmol/L   Glucose, Bld 142 (H) 70 - 99 mg/dL   BUN 19 8 - 23 mg/dL   Creatinine, Ser 0.55 0.44 - 1.00 mg/dL   Calcium 8.9 8.9 - 10.3  mg/dL   Total Protein 7.9 6.5 - 8.1 g/dL   Albumin 3.5 3.5 - 5.0 g/dL   AST 47 (H) 15 - 41 U/L   ALT 56 (H) 0 - 44 U/L   Alkaline Phosphatase 69 38 - 126 U/L   Total Bilirubin 0.7 0.3 - 1.2 mg/dL   GFR calc non Af Amer >60 >60 mL/min   GFR calc Af Amer >60 >60 mL/min   Anion gap 11 5 - 15    Comment: Performed at [facility], [address], [place], [place] [address]  Magnesium     Status: None   Collection Time: 11/10/18  5:46 PM  Result Value Ref Range   Magnesium 2.1 1.7 - 2.4 mg/dL    Comment: Performed at [facility], [address] [place], [place] [address]  Brain natriuretic peptide     Status: Abnormal   Collection Time: 11/10/18  5:46 PM  Result Value Ref Range   B Natriuretic Peptide 312.0 (H) 0.0 - 100.0 pg/mL    Comment: Performed at [facility], [address] [place],  [address]  Procalcitonin     Status: None   Collection Time: 11/10/18  5:46 PM  Result Value Ref Range   Procalcitonin <0.10 ng/mL    Comment:        Interpretation: PCT (Procalcitonin) <= 0.5 ng/mL: Systemic infection (sepsis) is not likely. Local bacterial infection is possible. (NOTE)       Sepsis PCT Algorithm           Lower Respiratory Tract                                      Infection PCT Algorithm    ----------------------------     ----------------------------         PCT < 0.25 ng/mL                PCT < 0.10 ng/mL         Strongly encourage             Strongly discourage   discontinuation of antibiotics    initiation of antibiotics    ----------------------------     -----------------------------       PCT 0.25 - 0.50 ng/mL  PCT 0.10 - 0.25 ng/mL               OR       >80% decrease in PCT            Discourage initiation of                                            antibiotics      Encourage discontinuation           of antibiotics    ----------------------------     -----------------------------         PCT >= 0.50 ng/mL               PCT 0.26 - 0.50 ng/mL               AND        <80% decrease in PCT             Encourage initiation of                                             antibiotics       Encourage continuation           of antibiotics    ----------------------------     -----------------------------        PCT >= 0.50 ng/mL                  PCT > 0.50 ng/mL               AND         increase in PCT                  Strongly encourage                                      initiation of antibiotics    Strongly encourage escalation           of antibiotics                                     -----------------------------                                           PCT <= 0.25 ng/mL                                                 OR                                        > 80% decrease in PCT  Discontinue / Do not initiate                                             antibiotics Performed at [facility], [address] [place], [place] [address]   SARS Coronavirus 2 ([place]- Performed in [place] [facility]), Hosp Order     Status: None   Collection Time: 11/10/18  5:47 PM   Specimen: Nasopharyngeal Swab  Result Value Ref Range   SARS Coronavirus 2 NEGATIVE NEGATIVE    Comment: (NOTE) If result is NEGATIVE SARS-CoV-2 target nucleic acids are NOT DETECTED. The SARS-CoV-2 RNA is generally detectable in upper and lower  respiratory specimens during the acute phase of infection. The lowest  concentration of SARS-CoV-2 viral copies this assay can detect is 250  copies / mL. A negative result does not preclude SARS-CoV-2 infection  and should not be used as the sole basis for treatment or other  patient management decisions.  A negative result may occur with  improper specimen collection / handling, submission of specimen other  than nasopharyngeal swab, presence of viral mutation(s) within the  areas targeted by this assay, and inadequate number of  viral copies  (<250 copies / mL). A negative result must be combined with clinical  observations, patient history, and epidemiological information. If result is POSITIVE SARS-CoV-2 target nucleic acids are DETECTED. The SARS-CoV-2 RNA is generally detectable in upper and lower  respiratory specimens dur ing the acute phase of infection.  Positive  results are indicative of active infection with SARS-CoV-2.  Clinical  correlation with patient history and other diagnostic information is  necessary to determine patient infection status.  Positive results do  not rule out bacterial infection or co-infection with other viruses. If result is PRESUMPTIVE POSTIVE SARS-CoV-2 nucleic acids MAY BE PRESENT.   A presumptive positive result was obtained on the submitted specimen  and confirmed on repeat testing.  While 2019 novel coronavirus  (SARS-CoV-2) nucleic acids may be present in the submitted sample  additional confirmatory testing may be necessary for epidemiological  and / or clinical management purposes  to differentiate between  SARS-CoV-2 and other Sarbecovirus currently known to infect humans.  If clinically indicated additional testing with an alternate test  methodology [phone]) is advised. The SARS-CoV-2 RNA is generally  detectable in upper and lower respiratory sp ecimens during the acute  phase of infection. The expected result is Negative. Fact Sheet for Patients:  StrictlyIdeas.no Fact Sheet for Healthcare Providers: BankingDealers.co.za This test is not yet approved or cleared by the [place] FDA and has been authorized for detection and/or diagnosis of SARS-CoV-2 by FDA under an Emergency Use Authorization (EUA).  This EUA will remain in effect (meaning this test can be used) for the duration of the COVID-19 declaration under Section 564(b)(1) of the Act, 21 U.S.C. section 360bbb-3(b)(1), unless the authorization is  terminated or revoked sooner. Performed at [facility], [address] [place], [place] [address]   Urinalysis, Routine w reflex microscopic     Status: Abnormal   Collection Time: 11/10/18  6:33 PM  Result Value Ref Range   Color, Urine AMBER (A) YELLOW    Comment: BIOCHEMICALS MAY BE AFFECTED BY COLOR   APPearance CLOUDY (A) CLEAR   Specific Gravity, Urine 1.019 1.005 - 1.030   pH 6.0 5.0 - 8.0   Glucose, UA  NEGATIVE NEGATIVE mg/dL   Hgb urine dipstick NEGATIVE NEGATIVE   Bilirubin Urine NEGATIVE NEGATIVE   Ketones, ur NEGATIVE NEGATIVE mg/dL   Protein, ur NEGATIVE NEGATIVE mg/dL   Nitrite NEGATIVE NEGATIVE   Leukocytes,Ua MODERATE (A) NEGATIVE   RBC / HPF 6-10 0 - 5 RBC/hpf   WBC, UA 21-50 0 - 5 WBC/hpf   Bacteria, UA NONE SEEN NONE SEEN   Squamous Epithelial / LPF 0-5 0 - 5   Mucus PRESENT     Comment: Performed at [facility], [address] [place], [place] [address]   Dg Chest Portable 1 View  Result Date: 11/10/2018 CLINICAL DATA:  Shortness of breath EXAM: PORTABLE CHEST 1 VIEW COMPARISON:  10/10/2018 FINDINGS: Cardiac shadow remains enlarged. Aortic calcifications are seen. Bilateral pleural effusions right greater than left are now seen new from the prior exam. Underlying right basilar infiltrate is likely present. Mild vascular congestion is noted without interstitial edema. No bony abnormality is noted. IMPRESSION: New pleural effusions and right basilar infiltrate. Increase in vascular congestion. Electronically Signed   By: Inez Catalina M.D.   On: 11/10/2018 18:15    Pending Labs Unresulted Labs (From admission, onward)    Start     Ordered   11/10/18 1829  Lactic acid, plasma  STAT Now then every 3 hours,   STAT     11/10/18 1828   11/10/18 1829  Blood Culture (routine x 2)  BLOOD CULTURE X 2,   STAT     11/10/18 1828   11/10/18 1829  Urine culture  ONCE - STAT,   STAT     11/10/18 1828   Signed and Held  Lipid panel  Tomorrow morning,    R     Signed and Held   Signed and Held  CBC  Tomorrow morning,   R     Signed and Held   Signed and Held  Basic metabolic panel  Tomorrow morning,   R     Signed and Held   Signed and Held  Protime-INR  Tomorrow morning,   R     Signed and Held   Signed and Held  TSH  Once,   R     Signed and Held   Signed and Held  Brain natriuretic peptide  Once,   R     Signed and Held          Vitals/Pain Today's Vitals   11/10/18 2000 11/10/18 2015 11/10/18 2030 11/10/18 2057  BP: 120/87  (!) 136/94 (!) 136/94  Pulse:  (!) 138 (!) 139 (!) 138  Resp: (!) 24 (!) 32 (!) 26 20  Temp:      TempSrc:      SpO2:  95% 94% 94%  Weight:      Height:      PainSc:        Isolation Precautions No active isolations  Medications Medications  diltiazem (CARDIZEM) 100 mg in dextrose 5 % 100 mL (1 mg/mL) infusion (10 mg/hr Intravenous Rate/Dose Change 11/10/18 2057)  ipratropium-albuterol (DUONEB) 0.5-2.5 (3) MG/3ML nebulizer solution 3 mL (3 mLs Nebulization Given 11/10/18 1756)  ipratropium-albuterol (DUONEB) 0.5-2.5 (3) MG/3ML nebulizer solution 3 mL (3 mLs Nebulization Given 11/10/18 1756)  diltiazem (CARDIZEM CD) 24 hr capsule 180 mg (180 mg Oral Given 11/10/18 1924)  furosemide (LASIX) injection 20 mg (20 mg Intravenous Given 11/10/18 1928)    Mobility walks with device Moderate fall risk   Focused Assessments 1   R Recommendations: See  Admitting Provider Note  Report given to:   Additional Notes:

## 2018-11-10 NOTE — Telephone Encounter (Signed)
I have reviewed Dr. Tyrell Antonio last clinic note in which the patient was also noted to be tachycardic into the 120s bpm with suspected atrial flutter leading to difficulty controlled ventricular rates.  It was also noted rate control therapy was not titrated at that time secondary to concern for significant bradycardia.  The patient was also instructed to hold beta-blocker/calcium channel blocker on morning of cardioversion secondary to underlying bradycardic heart rate while in sinus.  Given heart rates and BP are stable and consistent with most recent visit with Dr. Fletcher Anon I am hesitant to escalate rate control therapy at this time as long as the patient is remaining asymptomatic without increased shortness of breath, dizziness, presyncope, or concern for volume overload.  If any concerning symptoms arise they may need to proceed to the ED for evaluation prior to upcoming planned outpatient cardioversion.

## 2018-11-10 NOTE — ED Notes (Signed)
Report given to 2A RN

## 2018-11-10 NOTE — Telephone Encounter (Signed)
I spoke with Thurmond Butts- advised I feel like the patient need further evaluation in the ER for her symptoms. Ryan, PA was in agreement.  I called and spoke with the patient's daughter, [name] and advised her of the above. She states she is also in agreement with this as the patient's wheezing/ cough/ low sats are new.  Per [name], she will call her other sister, and discuss with her and they will call 911 for transport of the patient.

## 2018-11-10 NOTE — H&P (Addendum)
[facility] [place] NAME: Ashley Weiss    MR#:  [id number]  DATE OF BIRTH:  09-21-1919  DATE OF ADMISSION:  11/10/2018  PRIMARY CARE PHYSICIAN: Lavera Guise, MD   REQUESTING/REFERRING PHYSICIAN: Merlyn Lot, MD  CHIEF COMPLAINT:   Chief Complaint  Patient presents with  . Shortness of Breath    HISTORY OF PRESENT ILLNESS:  Ashley Weiss  is a 85 y.o. female with a known history of atrial fibrillation, CHF, COPD, hypertension, hyperlipidemia. She presented to the emergency room for evaluation of "fast heart rate "with shortness of breath and cough over the last 2 to 3 days.  She has a history of chronic respiratory failure and is on oxygen at 2 L/min per nasal cannula at home.  She reports a history of atrial fibrillation with difficulty controlling the rate over the last few weeks.  She has been seeing Dr. Fletcher Anon and is planned for cardioversion on this upcoming Monday according to the patient.  Patient is currently taking diltiazem 180 mg daily as well as metoprolol tartrate 150 mg daily.  She is on Eliquis 2.5 mg twice daily as well.  She was found to be in atrial fibrillation with RVR with heart rate sustained in the 140s in the emergency room.  She was started on diltiazem infusion.  She denies chest pain, nausea, vomiting, diarrhea.  She denies abdominal pain.  She denies fevers or chills.  She is experiencing mild palpitations.  She reports shortness of breath and hacking type nonproductive cough associated with elevated heart rate over the last few days.  BNP is 283.  Chest x-ray demonstrates new pleural effusions and right basilar infiltrate with increasing vascular congestion.  She received IV Lasix in the emergency room as well as DuoNeb treatment for shortness of breath.  We have admitted her to the hospitalist service for further management.  PAST MEDICAL HISTORY:   Past Medical History:  Diagnosis Date  . Arthritis    . Asthma   . Cancer ([place])    skin  . Depression   . DVT (deep venous thrombosis) ([place])   . Hyperlipidemia   . Hypertension   . Phlebitis     PAST SURGICAL HISTORY:   Past Surgical History:  Procedure Laterality Date  . BREAST CYST ASPIRATION Left 20+ yrs ago  . CATARACT EXTRACTION Bilateral 2005,2009  . cyst removal-right shoulder  1972  . otosclerosis Left 1988   hardening of bone, other 1989 redone  . THYROIDECTOMY  1970   nodule and 1/2 bridge removal  . WRIST SURGERY Left 11/29/2009    SOCIAL HISTORY:   Social History   Tobacco Use  . Smoking status: Never Smoker  . Smokeless tobacco: Never Used  Substance Use Topics  . Alcohol use: No    FAMILY HISTORY:   Family History  Problem Relation Age of Onset  . Osteoarthritis Mother   . Depression Father   . Breast cancer Neg Hx     DRUG ALLERGIES:   Allergies  Allergen Reactions  . Hydralazine Itching and Swelling  . Biaxin [Clarithromycin] Nausea And Vomiting  . Ciprofloxacin Other (See Comments)    Hand cramping   . Meloxicam Other (See Comments)    Unknown  . Minocycline Other (See Comments)    Unknown    REVIEW OF SYSTEMS:   Review of Systems  Constitutional: Negative for chills, fever and malaise/fatigue.  HENT: Negative for congestion, sinus pain and sore throat.  Eyes: Negative for blurred vision and double vision.  Respiratory: Positive for cough and shortness of breath. Negative for hemoptysis, sputum production and wheezing.   Cardiovascular: Positive for palpitations. Negative for chest pain and leg swelling.  Gastrointestinal: Negative for abdominal pain, constipation, diarrhea, heartburn, nausea and vomiting.  Genitourinary: Negative for dysuria, flank pain and hematuria.  Musculoskeletal: Negative for falls, joint pain and myalgias.  Skin: Negative.  Negative for itching and rash.  Neurological: Negative for dizziness, focal weakness and headaches.  Psychiatric/Behavioral:  Negative.  Negative for depression.   MEDICATIONS AT HOME:   Prior to Admission medications   Medication Sig Start Date End Date Taking? Authorizing Provider  apixaban (ELIQUIS) 2.5 MG TABS tablet Take 1 tablet (2.5 mg total) by mouth 2 (two) times daily. 11/05/18  Yes Wellington Hampshire, MD  denosumab (PROLIA) 60 MG/ML SOSY injection Inject 60 mg into the skin every 6 (six) months.   Yes [provider]  diltiazem (CARDIZEM) 60 MG tablet Take 30-60 mg by mouth 4 (four) times daily.   Yes [provider]  diltiazem (DILACOR XR) 180 MG 24 hr capsule Take 1 capsule (180 mg total) by mouth daily. 10/29/18  Yes Wellington Hampshire, MD  fluticasone (FLONASE) 50 MCG/ACT nasal spray Place 1 spray into both nostrils daily as needed for allergies or rhinitis.   Yes [provider]  fluticasone (FLOVENT HFA) 110 MCG/ACT inhaler Inhale 2 puffs into the lungs 2 (two) times daily. 09/30/18  Yes Scarboro, Audie Clear, NP  ipratropium-albuterol (DUONEB) 0.5-2.5 (3) MG/3ML SOLN Take 3 mLs by nebulization 3 (three) times daily as needed. Use 1 vial 3 times daily as needed for SOB dia j45.1 09/30/18 12/29/18 Yes Scarboro, Audie Clear, NP  levothyroxine (SYNTHROID, LEVOTHROID) 25 MCG tablet TAKE 1 DAILY BY MOUTH IN THE MORNING ON AN EMPTY STOMACH Patient taking differently: Take 25 mcg by mouth daily before breakfast. TAKE 1 DAILY BY MOUTH IN THE MORNING ON AN EMPTY STOMACH 06/24/18  Yes Scarboro, Audie Clear, NP  metoprolol tartrate (LOPRESSOR) 100 MG tablet Take 1.5 tablets twice daily. Patient taking differently: Take 150 mg by mouth 2 (two) times daily. Take 1.5 tablets twice daily.  09/30/18  Yes Scarboro, Audie Clear, NP  mometasone (ELOCON) 0.1 % lotion Apply 4 drops topically at bedtime as needed for itching. 09/14/18  Yes [provider]  montelukast (SINGULAIR) 10 MG tablet TAKE 1 TABLET BY MOUTH EVERY DAY FOR COUGH Patient taking differently: Take 10 mg by mouth daily. TAKE 1 TABLET BY MOUTH EVERY DAY  FOR COUGH 06/18/18  Yes Boscia, Greer Ee, NP  Multiple Vitamins-Minerals (MULTIVITAMIN WITH MINERALS) tablet Take 1 tablet by mouth daily.   Yes [provider]  rosuvastatin (CRESTOR) 5 MG tablet Take 1 tablet (5 mg total) by mouth daily at 6 PM. 10/12/18  Yes Fritzi Mandes, MD  sertraline (ZOLOFT) 100 MG tablet Take 1 tablet (100 mg total) by mouth daily. 03/23/18  Yes Boscia, Greer Ee, NP  vitamin C (ASCORBIC ACID) 500 MG tablet Take 500 mg by mouth daily.   Yes [provider]  albuterol (PROVENTIL HFA;VENTOLIN HFA) 108 (90 Base) MCG/ACT inhaler Inhale 2 puffs into the lungs every 6 (six) hours as needed for wheezing or shortness of breath. 07/08/17   Ronnell Freshwater, NP  Blood Glucose Monitoring Suppl (FREESTYLE FREEDOM LITE) w/Device KIT 1 kit by Does not apply route daily. Patient not taking: Reported on 11/10/2018 05/18/18   Kendell Bane, NP  glucose blood (  FREESTYLE LITE) test strip Use as directed once a daily Diag E11.65 Patient not taking: Reported on 11/10/2018 05/19/18   Ronnell Freshwater, NP  Lancets (FREESTYLE) lancets Use as directed once daily diag e11.65 Patient not taking: Reported on 11/10/2018 05/19/18   Ronnell Freshwater, NP  OXYGEN Inhale 2 L into the lungs every evening. Sleep with nasal cannula w/ O2 at 2lpm.    [provider]      VITAL SIGNS:  Blood pressure (!) 136/94, pulse (!) 138, temperature 97.7 F (36.5 C), temperature source Oral, resp. rate 20, height 5' 2" (1.575 m), weight 62.6 kg, SpO2 94 %.  PHYSICAL EXAMINATION:  Physical Exam  GENERAL:  85 y.o.-year-old patient lying in the bed with no acute distress.  EYES: Pupils equal, round, reactive to light and accommodation. No scleral icterus. Extraocular muscles intact.  HEENT: Head atraumatic, normocephalic. Oropharynx and nasopharynx clear.  NECK:  Supple, no jugular venous distention. No thyroid enlargement, no tenderness.  LUNGS: Normal breath sounds bilaterally diminished in  the bases, no wheezing, rales,rhonchi or crepitation. No use of accessory muscles of respiration.  CARDIOVASCULAR: tachycardia with arrythmia, S1, S2 normal. No murmurs, rubs, or gallops.  ABDOMEN: Soft, nondistended, nontender. Bowel sounds present. No organomegaly or mass.  EXTREMITIES: No pedal edema, cyanosis, or clubbing.  NEUROLOGIC: Cranial nerves II through XII are intact. Muscle strength 5/5 in all extremities. Sensation intact. Gait not checked.  PSYCHIATRIC: The patient is alert and oriented x 3.  Normal affect and good eye contact. SKIN: No obvious rash, lesion, or ulcer.   LABORATORY PANEL:   CBC Recent Labs  Lab 11/10/18 1746  WBC 9.2  HGB 11.9*  HCT 37.0  PLT 227   ------------------------------------------------------------------------------------------------------------------  Chemistries  Recent Labs  Lab 11/10/18 1746  NA 133*  K 4.5  CL 95*  CO2 27  GLUCOSE 142*  BUN 19  CREATININE 0.55  CALCIUM 8.9  MG 2.1  AST 47*  ALT 56*  ALKPHOS 69  BILITOT 0.7   ------------------------------------------------------------------------------------------------------------------  Cardiac Enzymes No results for input(s): TROPONINI in the last 168 hours. ------------------------------------------------------------------------------------------------------------------  RADIOLOGY:  Dg Chest Portable 1 View  Result Date: 11/10/2018 CLINICAL DATA:  Shortness of breath EXAM: PORTABLE CHEST 1 VIEW COMPARISON:  10/10/2018 FINDINGS: Cardiac shadow remains enlarged. Aortic calcifications are seen. Bilateral pleural effusions right greater than left are now seen new from the prior exam. Underlying right basilar infiltrate is likely present. Mild vascular congestion is noted without interstitial edema. No bony abnormality is noted. IMPRESSION: New pleural effusions and right basilar infiltrate. Increase in vascular congestion. Electronically Signed   By: Inez Catalina M.D.    On: 11/10/2018 18:15      IMPRESSION AND PLAN:   1.  Atrial fibrillation with RVR - Patient started on diltiazem infusion -Continue telemetry monitoring - Cardiology consulted - Echocardiogram - We will trend troponin levels -O2 per nasal cannula -Continue Eliquis -Repeat BMP in the a.m.  2.  CHF with mild exacerbation - Patient received IV Lasix in the emergency room -Continue IV Lasix 20 mg daily -O2 at 2 L per nasal cannula - Beta-blocker therapy continued - Echocardiogram ordered  3.  COPD - DuoNeb every 6 hours - O2 per nasal cannula   DVT and PPI prophylaxis continued    All the records are reviewed and case discussed with ED provider. The plan of care was discussed in details with the patient (and family). I answered all questions. The patient agreed to proceed with the  above mentioned plan. Further management will depend upon hospital course.   CODE STATUS: DNR  TOTAL TIME TAKING CARE OF THIS PATIENT: 45 minutes.    Homer 11/10/2018 at 11:30 PM  Pager - [phone]  After 6pm go to [email or web address] - password [email or web address] [facility]  [facility]  Office  [phone]  CC: Primary care physician; Lavera Guise, MD   Note: This dictation was prepared with Dragon dictation along with smaller phrase technology. Any transcriptional errors that result from this process are unintentional.

## 2018-11-10 NOTE — ED Notes (Signed)
Daughter informed of plan of care

## 2018-11-10 NOTE — ED Notes (Signed)
Attempted to call report to 2A RN, Network engineer states she will have to call me back

## 2018-11-10 NOTE — Telephone Encounter (Signed)
Patients daughter calling in with patient updates. O2 stats on room air 80-83%, wheezing, brownish mucous, no fever. Patients daughter spoke to pulmonologist and they recommended daughter to call cardiology. Only using oxygen concentrator when sleeping typically, but has been using it as needed during the day.    6:30  139/101 139 9:00  118/79  142  (given an extra 1/2 of cardiziem) 3:00  112/69  91

## 2018-11-10 NOTE — ED Provider Notes (Signed)
[facility] Emergency Department Provider Note    First MD Initiated Contact with Patient 11/10/18 1725     (approximate)  I have reviewed the triage vital signs and the nursing notes.   HISTORY  Chief Complaint Shortness of Breath    HPI Ashley Weiss is a 85 y.o. female close past medical history presents the ER for evaluation of "fast heart rate "shortness of breath and cough for the past several days.  Does wear 2 L of nasal cannula chronically.  States that she does have a history of A. fib but has been having trouble controlling the rate over the past few weeks.  Denies any orthopnea.  No chest pain right now.  No nausea or vomiting.   No measured fevers.  Does appear frail and weak mildly tachypneic arrives to the ER on 4 L nasal cannula satting 100% satting 97% on 2   Past Medical History:  Diagnosis Date  . Arthritis   . Asthma   . Cancer ([place])    skin  . Depression   . DVT (deep venous thrombosis) ([place])   . Hyperlipidemia   . Hypertension   . Phlebitis    Family History  Problem Relation Age of Onset  . Osteoarthritis Mother   . Depression Father   . Breast cancer Neg Hx    Past Surgical History:  Procedure Laterality Date  . BREAST CYST ASPIRATION Left 20+ yrs ago  . CATARACT EXTRACTION Bilateral 2005,2009  . cyst removal-right shoulder  1972  . otosclerosis Left 1988   hardening of bone, other 1989 redone  . THYROIDECTOMY  1970   nodule and 1/2 bridge removal  . WRIST SURGERY Left 11/29/2009   Patient Active Problem List   Diagnosis Date Noted  . Shortness of breath 10/21/2018  . Atrial fibrillation with rapid ventricular response ([place]) 10/10/2018  . Atrial fibrillation with RVR ([place]) 10/10/2018  . Nocturnal hypoxemia due to asthma 10/29/2017  . Acute pain of right wrist 09/30/2017  . Hyperlipidemia 07/23/2017  . Osteoporosis, post-menopausal 07/03/2017  . Chronic obstructive pulmonary disease, unspecified ([place])  05/28/2017  . Pain in left knee 05/28/2017  . Essential (primary) hypertension 05/28/2017  . Cardiac arrhythmia 05/28/2017  . Hypothyroidism 05/28/2017  . Injury of right hip 06/15/2015  . Mild intermittent asthma without complication 50/56/9794      Prior to Admission medications   Medication Sig Start Date End Date Taking? Authorizing Provider  albuterol (PROVENTIL HFA;VENTOLIN HFA) 108 (90 Base) MCG/ACT inhaler Inhale 2 puffs into the lungs every 6 (six) hours as needed for wheezing or shortness of breath. 07/08/17   Ronnell Freshwater, NP  apixaban (ELIQUIS) 2.5 MG TABS tablet Take 1 tablet (2.5 mg total) by mouth 2 (two) times daily. 11/05/18   Wellington Hampshire, MD  Blood Glucose Monitoring Suppl (FREESTYLE FREEDOM LITE) w/Device KIT 1 kit by Does not apply route daily. Patient not taking: Reported on 11/10/2018 05/18/18   Kendell Bane, NP  denosumab (PROLIA) 60 MG/ML SOSY injection Inject 60 mg into the skin every 6 (six) months.    [provider]  diltiazem (DILACOR XR) 180 MG 24 hr capsule Take 1 capsule (180 mg total) by mouth daily. 10/29/18   Wellington Hampshire, MD  fluticasone (FLONASE) 50 MCG/ACT nasal spray Place 1 spray into both nostrils daily as needed for allergies or rhinitis.    [provider]  fluticasone (FLOVENT HFA) 110 MCG/ACT inhaler Inhale 2 puffs into the lungs  2 (two) times daily. 09/30/18   Kendell Bane, NP  glucose blood (FREESTYLE LITE) test strip Use as directed once a daily Diag E11.65 Patient not taking: Reported on 11/10/2018 05/19/18   Ronnell Freshwater, NP  ipratropium-albuterol (DUONEB) 0.5-2.5 (3) MG/3ML SOLN Take 3 mLs by nebulization 3 (three) times daily as needed. Use 1 vial 3 times daily as needed for SOB dia j45.1 09/30/18 12/29/18  Kendell Bane, NP  Lancets (FREESTYLE) lancets Use as directed once daily diag e11.65 Patient not taking: Reported on 11/10/2018 05/19/18   Ronnell Freshwater, NP  levothyroxine (SYNTHROID, LEVOTHROID)  25 MCG tablet TAKE 1 DAILY BY MOUTH IN THE MORNING ON AN EMPTY STOMACH 06/24/18   Kendell Bane, NP  metoprolol tartrate (LOPRESSOR) 100 MG tablet Take 1.5 tablets twice daily. 09/30/18   Kendell Bane, NP  mometasone (ELOCON) 0.1 % lotion Apply 4 drops topically at bedtime as needed for itching. 09/14/18   [provider]  montelukast (SINGULAIR) 10 MG tablet TAKE 1 TABLET BY MOUTH EVERY DAY FOR COUGH 06/18/18   Ronnell Freshwater, NP  Multiple Vitamins-Minerals (MULTIVITAMIN WITH MINERALS) tablet Take 1 tablet by mouth daily.    [provider]  OXYGEN Inhale 2 L into the lungs every evening. Sleep with nasal cannula w/ O2 at 2lpm.    [provider]  rosuvastatin (CRESTOR) 5 MG tablet Take 1 tablet (5 mg total) by mouth daily at 6 PM. 10/12/18   Fritzi Mandes, MD  sertraline (ZOLOFT) 100 MG tablet Take 1 tablet (100 mg total) by mouth daily. 03/23/18   Ronnell Freshwater, NP    Allergies Hydralazine, Biaxin [clarithromycin], Ciprofloxacin, Meloxicam, and Minocycline    Social History Social History   Tobacco Use  . Smoking status: Never Smoker  . Smokeless tobacco: Never Used  Substance Use Topics  . Alcohol use: No  . Drug use: No    Review of Systems Patient denies headaches, rhinorrhea, blurry vision, numbness, shortness of breath, chest pain, edema, cough, abdominal pain, nausea, vomiting, diarrhea, dysuria, fevers, rashes or hallucinations unless otherwise stated above in HPI. ____________________________________________   PHYSICAL EXAM:  VITAL SIGNS: Vitals:   11/10/18 1830 11/10/18 1924  BP: (!) 122/96 (!) 146/129  Pulse: 66   Resp: (!) 29   Temp:    SpO2: 97%     Constitutional: Alert and oriented frail and weak appearing.  Eyes: Conjunctivae are normal.  Head: Atraumatic. Nose: No congestion/rhinnorhea. Mouth/Throat: Mucous membranes are moist.   Neck: No stridor. Painless ROM.  Cardiovascular: tachycardic irregularly irregular  rhythm. Grossly normal heart sounds.  Good peripheral circulation. Respiratory mild tachypnea with diminished right sided breathsounds, diffuse expiratory wheeze Gastrointestinal: Soft and nontender. No distention. No abdominal bruits. No CVA tenderness. Genitourinary:  Musculoskeletal: No lower extremity tenderness nor edema.  No joint effusions. Neurologic:  Normal speech and language. No gross focal neurologic deficits are appreciated. No facial droop Skin:  Skin is warm, dry and intact. No rash noted. Psychiatric: Mood and affect are normal. Speech and behavior are normal.  ____________________________________________   LABS (all labs ordered are listed, but only abnormal results are displayed)  Results for orders placed or performed during the hospital encounter of 11/10/18 (from the past 24 hour(s))  CBC with Differential/Platelet     Status: Abnormal   Collection Time: 11/10/18  5:46 PM  Result Value Ref Range   WBC 9.2 4.0 - 10.5 K/uL   RBC 3.71 (L) 3.87 - 5.11 MIL/uL  Hemoglobin 11.9 (L) 12.0 - 15.0 g/dL   HCT 37.0 36.0 - 46.0 %   MCV 99.7 80.0 - 100.0 fL   MCH 32.1 26.0 - 34.0 pg   MCHC 32.2 30.0 - 36.0 g/dL   RDW 14.0 11.5 - 15.5 %   Platelets 227 150 - 400 K/uL   nRBC 0.0 0.0 - 0.2 %   Neutrophils Relative % 70 %   Neutro Abs 6.4 1.7 - 7.7 K/uL   Lymphocytes Relative 17 %   Lymphs Abs 1.6 0.7 - 4.0 K/uL   Monocytes Relative 8 %   Monocytes Absolute 0.8 0.1 - 1.0 K/uL   Eosinophils Relative 4 %   Eosinophils Absolute 0.4 0.0 - 0.5 K/uL   Basophils Relative 1 %   Basophils Absolute 0.1 0.0 - 0.1 K/uL   Immature Granulocytes 0 %   Abs Immature Granulocytes 0.03 0.00 - 0.07 K/uL  Comprehensive metabolic panel     Status: Abnormal   Collection Time: 11/10/18  5:46 PM  Result Value Ref Range   Sodium 133 (L) 135 - 145 mmol/L   Potassium 4.5 3.5 - 5.1 mmol/L   Chloride 95 (L) 98 - 111 mmol/L   CO2 27 22 - 32 mmol/L   Glucose, Bld 142 (H) 70 - 99 mg/dL   BUN 19 8  - 23 mg/dL   Creatinine, Ser 0.55 0.44 - 1.00 mg/dL   Calcium 8.9 8.9 - 10.3 mg/dL   Total Protein 7.9 6.5 - 8.1 g/dL   Albumin 3.5 3.5 - 5.0 g/dL   AST 47 (H) 15 - 41 U/L   ALT 56 (H) 0 - 44 U/L   Alkaline Phosphatase 69 38 - 126 U/L   Total Bilirubin 0.7 0.3 - 1.2 mg/dL   GFR calc non Af Amer >60 >60 mL/min   GFR calc Af Amer >60 >60 mL/min   Anion gap 11 5 - 15  Magnesium     Status: None   Collection Time: 11/10/18  5:46 PM  Result Value Ref Range   Magnesium 2.1 1.7 - 2.4 mg/dL  Brain natriuretic peptide     Status: Abnormal   Collection Time: 11/10/18  5:46 PM  Result Value Ref Range   B Natriuretic Peptide 312.0 (H) 0.0 - 100.0 pg/mL  Procalcitonin     Status: None   Collection Time: 11/10/18  5:46 PM  Result Value Ref Range   Procalcitonin <0.10 ng/mL  SARS Coronavirus 2 (CEPHEID- Performed in [place] hospital lab), Hosp Order     Status: None   Collection Time: 11/10/18  5:47 PM   Specimen: Nasopharyngeal Swab  Result Value Ref Range   SARS Coronavirus 2 NEGATIVE NEGATIVE   ____________________________________________  EKG My review and personal interpretation at Time: 17:32 Indication: sob  Rate: 124  Rhythm: affib with rvr Axis: normal Other: nonspecific st abn likely rate dependent ____________________________________________  RADIOLOGY  I personally reviewed all radiographic images ordered to evaluate for the above acute complaints and reviewed radiology reports and findings.  These findings were personally discussed with the patient.  Please see medical record for radiology report.  ____________________________________________   PROCEDURES  Procedure(s) performed:  .Critical Care Performed by: Merlyn Lot, MD Authorized by: Merlyn Lot, MD   Critical care provider statement:    Critical care time (minutes):  30   Critical care time was exclusive of:  Separately billable procedures and treating other patients   Critical care was time  spent personally by me on the following  activities:  Development of treatment plan with patient or surrogate, discussions with consultants, evaluation of patient's response to treatment, examination of patient, obtaining history from patient or surrogate, ordering and performing treatments and interventions, ordering and review of laboratory studies, ordering and review of radiographic studies, pulse oximetry, re-evaluation of patient's condition and review of old charts      Critical Care performed: yes ____________________________________________   INITIAL IMPRESSION / ASSESSMENT AND PLAN / ED COURSE  Pertinent labs & imaging results that were available during my care of the patient were reviewed by me and considered in my medical decision making (see chart for details).   DDX: Asthma, copd, CHF, pna, ptx, malignancy, Pe, anemia   Ashley Weiss is a 85 y.o. who presents to the ED with symptoms as described above.  Patient with A. fib with RVR mildly tachypneic with acute on chronic respiratory failure with hypoxia.  Currently protecting her airway.  Blood will be sent for the by differential.  The patient will be placed on continuous pulse oximetry and telemetry for monitoring.  Laboratory evaluation will be sent to evaluate for the above complaints.     Clinical Course as of Nov 10 1950  Tue Nov 10, 2018  1950 Patient without any fever.  Have lower suspicion for sepsis given negative procalcitonin.  Will obtain cultures.  Will give dose of Lasix.  Had some improvement after nebulizer therefore will also give steroid.  Patient will require hospitalization for further medical management.   [PR]    Clinical Course User Index [PR] Merlyn Lot, MD    The patient was evaluated in Emergency Department today for the symptoms described in the history of present illness. He/she was evaluated in the context of the global COVID-19 pandemic, which necessitated consideration that the  patient might be at risk for infection with the SARS-CoV-2 virus that causes COVID-19. Institutional protocols and algorithms that pertain to the evaluation of patients at risk for COVID-19 are in a state of rapid change based on information released by regulatory bodies including the CDC and federal and state organizations. These policies and algorithms were followed during the patient's care in the ED.  As part of my medical decision making, I reviewed the following data within the [place] notes reviewed and incorporated, Labs reviewed, notes from prior ED visits and [place] Controlled Substance Database   ____________________________________________   FINAL CLINICAL IMPRESSION(S) / ED DIAGNOSES  Final diagnoses:  Acute on chronic respiratory failure with hypoxia (HCC)  Atrial fibrillation with rapid ventricular response ([place])      NEW MEDICATIONS STARTED DURING THIS VISIT:  New Prescriptions   No medications on file     Note:  This document was prepared using Dragon voice recognition software and may include unintentional dictation errors.    Merlyn Lot, MD 11/10/18 [phone]

## 2018-11-11 ENCOUNTER — Encounter
Admission: EM | Disposition: A | Discharge status: Home Health | Payer: Self-pay | Source: Home / Self Care | Attending: Internal Medicine

## 2018-11-11 ENCOUNTER — Inpatient Hospital Stay: Admit: 2018-11-11 | Payer: Medicare Other

## 2018-11-11 ENCOUNTER — Inpatient Hospital Stay: Payer: Medicare Other | Admitting: Certified Registered Nurse Anesthetist

## 2018-11-11 ENCOUNTER — Inpatient Hospital Stay (HOSPITAL_COMMUNITY)
Admit: 2018-11-11 | Discharge: 2018-11-11 | Disposition: A | Discharge status: Home / Self Care | Payer: Medicare Other | Attending: Internal Medicine | Admitting: Internal Medicine

## 2018-11-11 DIAGNOSIS — I4892 Unspecified atrial flutter: Secondary | ICD-10-CM

## 2018-11-11 DIAGNOSIS — I340 Nonrheumatic mitral (valve) insufficiency: Secondary | ICD-10-CM

## 2018-11-11 DIAGNOSIS — I361 Nonrheumatic tricuspid (valve) insufficiency: Secondary | ICD-10-CM

## 2018-11-11 HISTORY — PX: CARDIOVERSION: SHX1299

## 2018-11-11 LAB — BLOOD CULTURE ID PANEL (REFLEXED)

## 2018-11-11 LAB — BASIC METABOLIC PANEL
Anion gap: 7 (ref 5–15)
BUN: 16 mg/dL (ref 8–23)
CO2: 31 mmol/L (ref 22–32)
Calcium: 8.5 mg/dL — ABNORMAL LOW (ref 8.9–10.3)
Chloride: 98 mmol/L (ref 98–111)
Creatinine, Ser: 0.56 mg/dL (ref 0.44–1.00)
GFR calc Af Amer: >60 mL/min (ref >60)
GFR calc non Af Amer: >60 mL/min (ref >60)
Glucose, Bld: 136 mg/dL — ABNORMAL HIGH (ref 70–99)
Potassium: 4.2 mmol/L (ref 3.5–5.1)
Sodium: 136 mmol/L (ref 135–145)

## 2018-11-11 LAB — GLUCOSE, CAPILLARY: Glucose-Capillary: 120 mg/dL — ABNORMAL HIGH (ref 70–99)

## 2018-11-11 LAB — CBC
HCT: 35.5 % — ABNORMAL LOW (ref 36.0–46.0)
Hemoglobin: 11.4 g/dL — ABNORMAL LOW (ref 12.0–15.0)
MCH: 32.4 pg (ref 26.0–34.0)
MCHC: 32.1 g/dL (ref 30.0–36.0)
MCV: 100.9 fL — ABNORMAL HIGH (ref 80.0–100.0)
Platelets: 195 10*3/uL (ref 150–400)
RBC: 3.52 MIL/uL — ABNORMAL LOW (ref 3.87–5.11)
RDW: 13.9 % (ref 11.5–15.5)
WBC: 9.3 10*3/uL (ref 4.0–10.5)
nRBC: 0 % (ref 0.0–0.2)

## 2018-11-11 LAB — BRAIN NATRIURETIC PEPTIDE: B Natriuretic Peptide: 283 pg/mL — ABNORMAL HIGH (ref 0.0–100.0)

## 2018-11-11 LAB — LACTIC ACID, PLASMA
Lactic Acid, Venous: 0.8 mmol/L (ref 0.5–1.9)
Lactic Acid, Venous: 1.3 mmol/L (ref 0.5–1.9)

## 2018-11-11 LAB — ECHOCARDIOGRAM COMPLETE
Height: 62 in
Weight: 2091.72 oz

## 2018-11-11 LAB — LIPID PANEL
Cholesterol: 79 mg/dL (ref 0–200)
HDL: 30 mg/dL — ABNORMAL LOW (ref >40)
LDL Cholesterol: 42 mg/dL (ref 0–99)
Total CHOL/HDL Ratio: 2.6 RATIO
Triglycerides: 34 mg/dL (ref <150)
VLDL: 7 mg/dL (ref 0–40)

## 2018-11-11 LAB — PROTIME-INR
INR: 1.6 — ABNORMAL HIGH (ref 0.8–1.2)
Prothrombin Time: 18.7 seconds — ABNORMAL HIGH (ref 11.4–15.2)

## 2018-11-11 LAB — TSH: TSH: 4.231 u[IU]/mL (ref 0.350–4.500)

## 2018-11-11 LAB — TROPONIN I (HIGH SENSITIVITY)
Troponin I (High Sensitivity): 5 ng/L (ref <18)
Troponin I (High Sensitivity): 5 ng/L (ref <18)

## 2018-11-11 SURGERY — CARDIOVERSION
Anesthesia: General

## 2018-11-11 MED ORDER — IPRATROPIUM-ALBUTEROL 0.5-2.5 (3) MG/3ML IN SOLN
RESPIRATORY_TRACT | Status: AC
  Filled 2018-11-11: qty 3

## 2018-11-11 MED ORDER — PROPOFOL 10 MG/ML IV BOLUS
INTRAVENOUS | Status: AC
  Filled 2018-11-11: qty 20

## 2018-11-11 MED ORDER — FUROSEMIDE 10 MG/ML IJ SOLN
20.0000 mg | Freq: Two times a day (BID) | INTRAMUSCULAR | Status: DC
  Administered 2018-11-11: 20 mg via INTRAVENOUS
  Filled 2018-11-11: qty 2

## 2018-11-11 MED ORDER — SODIUM CHLORIDE 0.9% FLUSH
3.0000 mL | Freq: Two times a day (BID) | INTRAVENOUS | Status: DC
  Administered 2018-11-11 – 2018-11-16 (×9): 3 mL via INTRAVENOUS

## 2018-11-11 MED ORDER — INSULIN ASPART 100 UNIT/ML ~~LOC~~ SOLN: 0.0000 [IU] | Freq: Three times a day (TID) | SUBCUTANEOUS | Status: DC

## 2018-11-11 MED ORDER — DILTIAZEM HCL ER COATED BEADS 180 MG PO CP24
180.0000 mg | ORAL_CAPSULE | Freq: Every day | ORAL | Status: DC
  Administered 2018-11-11 – 2018-11-15 (×5): 180 mg via ORAL
  Filled 2018-11-11 (×6): qty 1

## 2018-11-11 MED ORDER — FUROSEMIDE 10 MG/ML IJ SOLN: 20.0000 mg | Freq: Every day | INTRAMUSCULAR | Status: DC

## 2018-11-11 MED ORDER — IPRATROPIUM-ALBUTEROL 0.5-2.5 (3) MG/3ML IN SOLN
3.0000 mL | Freq: Three times a day (TID) | RESPIRATORY_TRACT | Status: DC
  Administered 2018-11-11 – 2018-11-16 (×15): 3 mL via RESPIRATORY_TRACT
  Filled 2018-11-11 (×15): qty 3

## 2018-11-11 MED ORDER — INSULIN ASPART 100 UNIT/ML ~~LOC~~ SOLN: 0.0000 [IU] | Freq: Every day | SUBCUTANEOUS | Status: DC

## 2018-11-11 MED ORDER — PROPOFOL 10 MG/ML IV BOLUS
INTRAVENOUS | Status: DC | PRN
  Administered 2018-11-11: 30 mg via INTRAVENOUS

## 2018-11-11 MED ORDER — SODIUM CHLORIDE 0.9 % IV SOLN: 250.0000 mL | INTRAVENOUS | Status: DC

## 2018-11-11 MED ORDER — ALBUTEROL SULFATE (2.5 MG/3ML) 0.083% IN NEBU: 2.5000 mg | INHALATION_SOLUTION | RESPIRATORY_TRACT | Status: DC | PRN

## 2018-11-11 MED ORDER — SODIUM CHLORIDE 0.9% FLUSH: 3.0000 mL | INTRAVENOUS | Status: DC | PRN

## 2018-11-11 MED ORDER — ORAL CARE MOUTH RINSE
15.0000 mL | Freq: Two times a day (BID) | OROMUCOSAL | Status: DC
  Administered 2018-11-11 – 2018-11-15 (×7): 15 mL via OROMUCOSAL

## 2018-11-11 NOTE — H&P (View-Only) (Signed)
°Cardiology Consultation:  ° °Patient ID: Ashley Weiss °MRN: [id number]; DOB: 05/21/1919 ° °Admit date: 11/10/2018 °Date of Consult: 11/11/2018 ° °Primary Care Provider: Khan, Fozia M, MD °Primary Cardiologist: Muhammad Arida, MD  °Primary Electrophysiologist:  None  ° ° °Patient Profile:  ° °Ashley Weiss is a 85 y.o. female with a hx of paroxysmal atrial fibrillation with rapid ventricular response, possible tachybradycardia syndrome, hyperlipidemia, essential hypertension, and who is being seen today for the evaluation of atrial fibrillation with rapid ventricular response at the request of Angela Seals, NP. ° °History of Present Illness:  ° °Ashley Weiss is a 85-year-old female with PMH as above and symptomatic paroxysmal atrial fibrillation.  She was reportedly been on metoprolol 150 mg twice daily and digoxin 0.25 mg once daily for many years to treat her atrial fibrillation.  Of note, she uses oxygen at home.  She also has reportedly been living with her daughters as of the last month. ° °She was most recently admitted to [facility] 6/20 with dizziness and weakness in the setting of atrial fibrillation with RVR and ventricular rate of 146 bpm.  She was given IV metoprolol, digoxin, and diltiazem with improvement in her heart rate.  Diltiazem was then added to her medication regimen during this hospitalization and in addition to low-dose Eliquis 2.5mg BID for anticoagulation (ordered 10/12/2018 on review of EMR).  She underwent an echocardiogram that showed normal LV systolic function with moderately dilated left atrium, mild aortic stenosis, mild to moderate tricuspid regurgitation, and mild pulmonary hypertension.  Since that time, she has been followed by [facility] cardiology.  Digoxin has subsequently been discontinued and the low-dose diltiazem increased.  Benicar has also been discontinued to allow for increased dose of diltiazem.  This allowed for slight improvement in her heart rate; however, she was reportedly  still often tachycardic. As a result, short acting diltiazem was added over the phone to be used on a PRN basis. Most recently, she was seen by [facility] cardiology on 11/05/2022 and for her follow-up of atrial fibrillation / atrial flutter with planned outpatient cardioversion for this upcoming Monday 7/27.  ° °On 7/20, the patient's daughter reportedly called the office after the patient woke up with her nasal canula out of her nose and c/o symptomatic atrial fibrillation with her ventricular rate had been fluctuating with rates as high as the 140s. Per patient, she felt as if her heart was beating hard against her chest but denied chest pain. She also denied any nausea, emesis, or diarrhea. She denied any recent illness or exposure to illness. She also denied recent time outside in the heat. O2 saturations were reported at 91-95% on oxygen per daughter. The patient herself denied any SOB at that time; however, per daughter, the patient did experience some SOB and wheezing. The patient also denied any recent LEE. She reported she feels her atrial fibrillation the most when laying flat in bed and that it has intermittently been bad over the last month. ° ° Of note, as indicated above, the patient does have a history of possible tachy-brady syndrome with slower heart rates recorded in the past on review of EMR. She confirmed medication compliance and has taken her Eliquis as prescribed every day and without missing any doses. She reported her last meal as 7/21 at 8PM.  ° °Heart Pathway Score:     °Past Medical History:  °Diagnosis Date  °• Arthritis   °• Asthma   °• Cancer (HCC)   ° skin  °•   Depression   °• DVT (deep venous thrombosis) (HCC)   °• Hyperlipidemia   °• Hypertension   °• Phlebitis   ° ° °Past Surgical History:  °Procedure Laterality Date  °• BREAST CYST ASPIRATION Left 20+ yrs ago  °• CATARACT EXTRACTION Bilateral 2005,2009  °• cyst removal-right shoulder  1972  °• otosclerosis Left 1988  ° hardening of bone,  other 1989 redone  °• THYROIDECTOMY  1970  ° nodule and 1/2 bridge removal  °• WRIST SURGERY Left 11/29/2009  °  ° °Home Medications:  °Prior to Admission medications   °Medication Sig Start Date End Date Taking? Authorizing Provider  °apixaban (ELIQUIS) 2.5 MG TABS tablet Take 1 tablet (2.5 mg total) by mouth 2 (two) times daily. 11/05/18  Yes Arida, Muhammad A, MD  °denosumab (PROLIA) 60 MG/ML SOSY injection Inject 60 mg into the skin every 6 (six) months.   Yes [provider]  °diltiazem (CARDIZEM) 60 MG tablet Take 30-60 mg by mouth 4 (four) times daily.   Yes [provider]  °diltiazem (DILACOR XR) 180 MG 24 hr capsule Take 1 capsule (180 mg total) by mouth daily. 10/29/18  Yes Arida, Muhammad A, MD  °fluticasone (FLONASE) 50 MCG/ACT nasal spray Place 1 spray into both nostrils daily as needed for allergies or rhinitis.   Yes [provider]  °fluticasone (FLOVENT HFA) 110 MCG/ACT inhaler Inhale 2 puffs into the lungs 2 (two) times daily. 09/30/18  Yes Scarboro, Adam J, NP  °ipratropium-albuterol (DUONEB) 0.5-2.5 (3) MG/3ML SOLN Take 3 mLs by nebulization 3 (three) times daily as needed. Use 1 vial 3 times daily as needed for SOB dia j45.1 09/30/18 12/29/18 Yes Scarboro, Adam J, NP  °levothyroxine (SYNTHROID, LEVOTHROID) 25 MCG tablet TAKE 1 DAILY BY MOUTH IN THE MORNING ON AN EMPTY STOMACH °Patient taking differently: Take 25 mcg by mouth daily before breakfast. TAKE 1 DAILY BY MOUTH IN THE MORNING ON AN EMPTY STOMACH 06/24/18  Yes Scarboro, Adam J, NP  °metoprolol tartrate (LOPRESSOR) 100 MG tablet Take 1.5 tablets twice daily. °Patient taking differently: Take 150 mg by mouth 2 (two) times daily. Take 1.5 tablets twice daily.  09/30/18  Yes Scarboro, Adam J, NP  °mometasone (ELOCON) 0.1 % lotion Apply 4 drops topically at bedtime as needed for itching. 09/14/18  Yes [provider]  °montelukast (SINGULAIR) 10 MG tablet TAKE 1 TABLET BY MOUTH EVERY DAY FOR COUGH °Patient taking  differently: Take 10 mg by mouth daily. TAKE 1 TABLET BY MOUTH EVERY DAY FOR COUGH 06/18/18  Yes Boscia, Heather E, NP  °Multiple Vitamins-Minerals (MULTIVITAMIN WITH MINERALS) tablet Take 1 tablet by mouth daily.   Yes [provider]  °rosuvastatin (CRESTOR) 5 MG tablet Take 1 tablet (5 mg total) by mouth daily at 6 PM. 10/12/18  Yes Patel, Sona, MD  °sertraline (ZOLOFT) 100 MG tablet Take 1 tablet (100 mg total) by mouth daily. 03/23/18  Yes Boscia, Heather E, NP  °vitamin C (ASCORBIC ACID) 500 MG tablet Take 500 mg by mouth daily.   Yes [provider]  °albuterol (PROVENTIL HFA;VENTOLIN HFA) 108 (90 Base) MCG/ACT inhaler Inhale 2 puffs into the lungs every 6 (six) hours as needed for wheezing or shortness of breath. 07/08/17   Boscia, Heather E, NP  °Blood Glucose Monitoring Suppl (FREESTYLE FREEDOM LITE) w/Device KIT 1 kit by Does not apply route daily. °Patient not taking: Reported on 11/10/2018 05/18/18   Scarboro, Adam J, NP  °glucose blood (FREESTYLE LITE) test strip Use as directed   once a daily Diag E11.65 Patient not taking: Reported on 11/10/2018 05/19/18   Ronnell Freshwater, NP  Lancets (FREESTYLE) lancets Use as directed once daily diag e11.65 Patient not taking: Reported on 11/10/2018 05/19/18   Ronnell Freshwater, NP  OXYGEN Inhale 2 L into the lungs every evening. Sleep with nasal cannula w/ O2 at 2lpm.    [provider]    Inpatient Medications: Scheduled Meds:  apixaban  2.5 mg Oral BID   budesonide  2 mL Inhalation BID   diltiazem  180 mg Oral Daily   furosemide  20 mg Intravenous Daily   levothyroxine  25 mcg Oral QAC breakfast   mouth rinse  15 mL Mouth Rinse BID   metoprolol tartrate  150 mg Oral BID   montelukast  10 mg Oral Daily   rosuvastatin  5 mg Oral q1800   sertraline  100 mg Oral Daily   sodium chloride flush  3 mL Intravenous Q12H   vitamin C  500 mg Oral Daily   Continuous Infusions:  sodium chloride     diltiazem (CARDIZEM)  infusion 5 mg/hr (11/11/18 0351)   PRN Meds: sodium chloride, acetaminophen, ondansetron (ZOFRAN) IV, sodium chloride flush  Allergies:    Allergies  Allergen Reactions   Hydralazine Itching and Swelling   Biaxin [Clarithromycin] Nausea And Vomiting   Ciprofloxacin Other (See Comments)    Hand cramping    Meloxicam Other (See Comments)    Unknown   Minocycline Other (See Comments)    Unknown    Social History:   Social History   Socioeconomic History   Marital status: Widowed    Spouse name: Not on file   Number of children: Not on file   Years of education: Not on file   Highest education level: Not on file  Occupational History   Not on file  Social Needs   Financial resource strain: Not on file   Food insecurity    Worry: Not on file    Inability: Not on file   Transportation needs    Medical: Not on file    Non-medical: Not on file  Tobacco Use   Smoking status: Never Smoker   Smokeless tobacco: Never Used  Substance and Sexual Activity   Alcohol use: No   Drug use: No   Sexual activity: Never  Lifestyle   Physical activity    Days per week: Not on file    Minutes per session: Not on file   Stress: Not on file  Relationships   Social connections    Talks on phone: Not on file    Gets together: Not on file    Attends religious service: Not on file    Active member of club or organization: Not on file    Attends meetings of clubs or organizations: Not on file    Relationship status: Not on file   Intimate partner violence    Fear of current or ex partner: Not on file    Emotionally abused: Not on file    Physically abused: Not on file    Forced sexual activity: Not on file  Other Topics Concern   Not on file  Social History Narrative   Not on file    Family History:    Family History  Problem Relation Age of Onset   Osteoarthritis Mother    Depression Father    Breast cancer Neg Hx      ROS:  Please see the  history of  present illness.  Review of Systems  Respiratory: Positive for shortness of breath and wheezing. Negative for hemoptysis.        SOB and wheezing per daughter  Cardiovascular: Negative for chest pain, palpitations and leg swelling.       Elevated rates with laying flat Feels elevated rates as if pounding in her chest  Gastrointestinal: Negative for abdominal pain, blood in stool, constipation, diarrhea, heartburn, melena, nausea and vomiting.  All other systems reviewed and are negative.   All other ROS reviewed and negative.     Physical Exam/Data:   Vitals:   11/10/18 2030 11/10/18 2057 11/10/18 2339 11/11/18 0410  BP: (!) 136/94 (!) 136/94 123/80 117/72  Pulse: (!) 139 (!) 138 72 67  Resp: (!) 26 20 16 16   Temp:   97.8 F (36.6 C) 97.8 F (36.6 C)  TempSrc:   Oral Oral  SpO2: 94% 94% 94% 95%  Weight:    59.3 kg  Height:   5' 2"  (1.575 m)     Intake/Output Summary (Last 24 hours) at 11/11/2018 0727 Last data filed at 11/11/2018 0351 Gross per 24 hour  Intake 52.58 ml  Output --  Net 52.58 ml   Last 3 Weights 11/11/2018 11/10/2018 11/05/2018  Weight (lbs) 130 lb 11.7 oz 138 lb 130 lb  Weight (kg) 59.3 kg 62.596 kg 58.968 kg     Body mass index is 23.91 kg/m.  General:  Elderly female in NAD HEENT: normal Neck: no JVD Vascular: No carotid bruits; radial pulses 2+ bilaterally Cardiac:  normal S1, I2;LNLG; 1/6 systolic murmur Lungs:  Reduced breath sounds bilaterally (poor inspiratory effort), expiratory weeze. No rhonchi or rales  Abd: soft, nontender, no hepatomegaly  Ext: No bilateral LEE Musculoskeletal:  No deformities, BUE and BLE strength normal and equal Skin: warm and dry  Neuro:  No focal abnormalities noted Psych:  Normal affect   EKG:  The EKG was personally reviewed and demonstrates:  Atrial fibrillation with rapid ventricular response, ventricular rate of 126 bpm, right axis deviation, IVCD with LPFB versus lead reversal Telemetry:  Telemetry  was personally reviewed and demonstrates:  Atrial fibrillation versus flutter with ventricular rates mid 60s-139 Relevant CV Studies:  Echo  10/22/2018 See scan under CV procedures tab: She underwent an echocardiogram that showed normal LV systolic function with moderately dilated left atrium, mild aortic stenosis, mild to moderate tricuspid regurgitation, and mild pulmonary hypertension.    Laboratory Data:  High Sensitivity Troponin:   Recent Labs  Lab 11/11/18 0010 11/11/18 0317  TROPONINIHS 5 5     Cardiac EnzymesNo results for input(s): TROPONINI in the last 168 hours. No results for input(s): TROPIPOC in the last 168 hours.  Chemistry Recent Labs  Lab 11/05/18 1136 11/10/18 1746 11/11/18 0317  NA 136 133* 136  K 4.5 4.5 4.2  CL 94* 95* 98  CO2 25 27 31   GLUCOSE 143* 142* 136*  BUN 15 19 16   CREATININE 0.50* 0.55 0.56  CALCIUM 9.1 8.9 8.5*  GFRNONAA 82 >60 >60  GFRAA 94 >60 >60  ANIONGAP  --  11 7    Recent Labs  Lab 11/10/18 1746  PROT 7.9  ALBUMIN 3.5  AST 47*  ALT 56*  ALKPHOS 69  BILITOT 0.7   Hematology Recent Labs  Lab 11/05/18 1136 11/10/18 1746 11/11/18 0317  WBC 8.8 9.2 9.3  RBC 3.79 3.71* 3.52*  HGB 12.2 11.9* 11.4*  HCT 37.6 37.0 35.5*  MCV 99* 99.7 100.9*  MCH 32.2  32.1 32.4  MCHC 32.4 32.2 32.1  RDW 12.4 14.0 13.9  PLT 228 227 195   BNP Recent Labs  Lab 11/10/18 1746 11/11/18 0010  BNP 312.0* 283.0*    DDimer No results for input(s): DDIMER in the last 168 hours.   Radiology/Studies:  Dg Chest Portable 1 View  Result Date: 11/10/2018 CLINICAL DATA:  Shortness of breath EXAM: PORTABLE CHEST 1 VIEW COMPARISON:  10/10/2018 FINDINGS: Cardiac shadow remains enlarged. Aortic calcifications are seen. Bilateral pleural effusions right greater than left are now seen new from the prior exam. Underlying right basilar infiltrate is likely present. Mild vascular congestion is noted without interstitial edema. No bony abnormality is noted.  IMPRESSION: New pleural effusions and right basilar infiltrate. Increase in vascular congestion. Electronically Signed   By: Inez Catalina M.D.   On: 11/10/2018 18:15    Assessment and Plan:   Atrial Fibrillation with RVR - Ventricular rates improved from yesterday (with into 140s); however, patient still remains symptomatic with SOB/wheezing noted on exam and c/o feeling her heart beat against her chest. Telemetry shows Afib / Aflutter this AM. As in HPI, difficulty controlling rates in the past due to underlying bradycardia in SR.  - Plan for DCCV today 7/22. Risks and benefits have been discussed with patient and daughter.  - NPO since 8PM on 7/21. Continue in preparation for DCCV. - Patient has not missed any doses of Eliquis 2.5 mg twice daily.  Continue anticoagulation. -Continue holding any rate reducing medications including metoprolol and diltiazem this morning in preparation for DCCV as patient has a history of possible tachybradycardia syndrome with concern for bradycardia after DCCV and once in NSR. - Further recommendations after DCCV today 7/22. Will need to cancel previously scheduled DCCV for 7/27 following today's procedure.     For questions or updates, please contact [place] Please consult [email or web address] for contact info under     Signed, Arvil Chaco, PA-C  11/11/2018 7:27 AM

## 2018-11-11 NOTE — Interval H&P Note (Signed)
History and Physical Interval Note:  11/11/2018 9:04 AM  Ashley Weiss  has presented today for cardioversion, with the diagnosis of atrial flutter.  The various methods of treatment have been discussed with the patient and family. After consideration of risks, benefits and other options for treatment, the patient has consented to  Procedure(s): CARDIOVERSION (N/A) as a surgical intervention.  The patient's history has been reviewed, patient examined, no change in status, stable for surgery.  I have reviewed the patient's chart and labs.  Questions were answered to the patient's satisfaction.     Frandy Basnett

## 2018-11-11 NOTE — Evaluation (Signed)
Occupational Therapy Evaluation Patient Details Name: Ashley Weiss MRN: [id number] DOB: 11-29-1919 Today's Date: 11/11/2018    History of Present Illness Pt is 85 yo female that presented to the emergency room for evaluation of "fast heart rate" with shortness of breath and cough over the last 2 to 3 days.  Workup revealed atrial fibrilation/a flutter with RVR, S/p cardioversion 11/11/2018. PMH of afib, CHF, COPD with 2L at baseline, HTN, HLD, DVT, asthma, skin cancer.   Clinical Impression   Ashley Weiss was seen for OT evaluation this date. Prior to this admission, pt was independent in ADLs and using a "walking stick" for mobility, living in a 1 story home with 4 steps to enter. Pt used O2 at night only. Recently, pt reports becoming easily fatigued or out of breath with minimal exertion. Pt currently requires minimal assist for dressing and bathing tasks due to poor activity tolerance. Pt educated in energy conservation conservation strategies including pursed lip breathing, activity pacing, home/routines modifications, work simplification, AE/DME, prioritizing of meaningful occupations, and falls prevention. Handout provided. Pt verbalized understanding but would benefit from additional skilled OT services to maximize recall and carryover of learned techniques and facilitate implementation of learned techniques into daily routines. Upon discharge, recommend [place] services.       Follow Up Recommendations  Home health OT    Equipment Recommendations  None recommended by OT    Recommendations for Other Services       Precautions / Restrictions Precautions Precautions: Fall Restrictions Weight Bearing Restrictions: No      Mobility Bed Mobility Overal bed mobility: Modified Independent                Transfers Overall transfer level: Needs assistance Equipment used: Rolling walker (2 wheeled) Transfers: Sit to/from Stand Sit to Stand: Supervision;Min guard               Balance Overall balance assessment: Needs assistance Sitting-balance support: Feet supported Sitting balance-Leahy Scale: Good     Standing balance support: Bilateral upper extremity supported;During functional activity Standing balance-Leahy Scale: Fair                 High Level Balance Comments: Pt experiences instability with turns           ADL either performed or assessed with clinical judgement   ADL Overall ADL's : Needs assistance/impaired Eating/Feeding: Sitting;Independent   Grooming: Sitting;Set up;Supervision/safety   Upper Body Bathing: Sitting;Minimal assistance;Cueing for compensatory techniques;Min guard Upper Body Bathing Details (indicate cue type and reason): Cueing for ECS. Lower Body Bathing: Sitting/lateral leans;Minimal assistance Lower Body Bathing Details (indicate cue type and reason): Cueing for ECS. Upper Body Dressing : Sitting;Set up   Lower Body Dressing: Sit to/from stand;Set up;Supervision/safety   Toilet Transfer: RW;Set up;Min guard;BSC;Ambulation;Cueing for safety   Toileting- Clothing Manipulation and Hygiene: Set up;Supervision/safety;Sit to/from stand       Functional mobility during ADLs: Min guard;Rolling walker       Vision Baseline Vision/History: Wears glasses Wears Glasses: Reading only Patient Visual Report: No change from baseline       Perception     Praxis      Pertinent Vitals/Pain Pain Assessment: No/denies pain     Hand Dominance Right   Extremity/Trunk Assessment Upper Extremity Assessment Upper Extremity Assessment: Overall WFL for tasks assessed   Lower Extremity Assessment Lower Extremity Assessment: Generalized weakness   Cervical / Trunk Assessment Cervical / Trunk Assessment: Kyphotic   Communication Communication Communication: HOH   Cognition  Arousal/Alertness: Awake/alert Behavior During Therapy: WFL for tasks assessed/performed Overall Cognitive Status: Within  Functional Limits for tasks assessed                                     General Comments       Exercises Other Exercises Other Exercises: Pt educated in falls prevention strategies, energy conservation strategies including routines modifications, adaptive equipent options, and pursed lip breathing, as well as safe use of AE for functional mobility.   Shoulder Instructions      Home Living Family/patient expects to be discharged to:: Private residence Living Arrangements: Alone Available Help at Discharge: Family;Available PRN/intermittently Type of Home: House Home Access: Stairs to enter [employer] of Steps: 4 Entrance Stairs-Rails: Left;Right;Can reach both Home Layout: One level     Bathroom Shower/Tub: Occupational psychologist: Standard Bathroom Accessibility: No(Pt states walker probably couldn't fit but her "walking stick" does.)   Home Equipment: Walker - standard;Bedside commode;Shower seat;Grab bars - tub/shower   Additional Comments: walking stick that the patient will sometimes use in house, uses O2 at night      Prior Functioning/Environment Level of Independence: Independent with assistive device(s)        Comments: Pt endorses at least 2 falls in the past six months. Stated she "passed out" in her bathroom on one occasion, and endorses standing on uneven ground outside for any other falls she may have had.        OT Problem List: Decreased strength;Decreased coordination;Cardiopulmonary status limiting activity;Decreased range of motion;Decreased activity tolerance;Decreased safety awareness;Impaired balance (sitting and/or standing);Decreased knowledge of use of DME or AE;Decreased knowledge of precautions      OT Treatment/Interventions: Self-care/ADL training;Balance training;Therapeutic exercise;Therapeutic activities;Energy conservation;DME and/or AE instruction;Patient/family education    OT Goals(Current goals  can be found in the care plan section) Acute Rehab OT Goals Patient Stated Goal: to go home OT Goal Formulation: With patient Time For Goal Achievement: 11/25/18 Potential to Achieve Goals: Good ADL Goals Pt Will Perform Upper Body Dressing: sitting;with modified independence(With LRAD PRN for improved safety and functional independence.) Pt Will Perform Lower Body Dressing: with set-up;with supervision;sit to/from stand;with adaptive equipment(With LRAD PRN for improved safety and functional independence) Pt Will Transfer to Toilet: ambulating;regular height toilet;with modified independence(With LRAD PRN for improved safety and functional independence) Additional ADL Goal #1: Pt will independently verbalize a plan to implement at least 3 learned energy conservation strategies into her daily routines/activities for improved safety and functional independence upon hospital DC.  OT Frequency: Min 1X/week   Barriers to D/C:            Co-evaluation              AM-PAC OT "6 Clicks" Daily Activity     Outcome Measure Help from another person eating meals?: None Help from another person taking care of personal grooming?: A Little Help from another person toileting, which includes using toliet, bedpan, or urinal?: A Little Help from another person bathing (including washing, rinsing, drying)?: A Little Help from another person to put on and taking off regular upper body clothing?: A Little Help from another person to put on and taking off regular lower body clothing?: A Little 6 Click Score: 19   End of Session Equipment Utilized During Treatment: Gait belt;Rolling walker  Activity Tolerance:   Patient left: in chair;with call bell/phone within reach;with chair alarm set  OT Visit Diagnosis: Other abnormalities of gait and mobility (R26.89);History of falling (Z91.81);Muscle weakness (generalized) (M62.81)                Time: 5749-3552 OT Time Calculation (min): 28 min Charges:   OT General Charges $OT Visit: 1 Visit OT Evaluation $OT Eval Low Complexity: 1 Low OT Treatments $Self Care/Home Management : 8-22 mins  Shara Blazing, M.S., OTR/L Ascom: [phone] 11/11/18, 3:36 PM

## 2018-11-11 NOTE — Anesthesia Post-op Follow-up Note (Signed)
Anesthesia QCDR form completed.        

## 2018-11-11 NOTE — Progress Notes (Signed)
OT Cancellation Note  Patient Details Name: Ashley Weiss MRN: [id number] DOB: Aug 30, 1919   Cancelled Evaluation:    Reason Eval/Treat Not Completed: Other (comment)(Pt scheduled for cardioversion today. Will follow acutely and re-attempt when patient is more medically appropriate for exertional activities.)  [name] 11/11/2018, 12:46 PM

## 2018-11-11 NOTE — Progress Notes (Addendum)
Addendum: Agree with note below.   Eleonore Chiquito, PharmD, BCPS  PHARMACY - PHYSICIAN COMMUNICATION CRITICAL VALUE ALERT - BLOOD CULTURE IDENTIFICATION (BCID)  Ashley Weiss is an 85 y.o. female who presented to [facility] on 11/10/2018 with a chief complaint of tachycardia, cough, and increased SOB for 2-3 days  Assessment:  1/2 sets (2/4 bottles) positive for MecA negative coag negative staph- suspected contaminant. Pt is afebrile and un-elevated WBC  Name of physician (or Provider) Contacted: Dr. Anselm Jungling   Current antibiotics: None  Changes to prescribed antibiotics recommended: No antibiotics indicated Discussed results with provider and agreed with plan to not start antibiotics at this time  Results for orders placed or performed during the hospital encounter of 11/10/18  Blood Culture ID Panel (Reflexed) (Collected: 11/10/2018  6:33 PM)  Result Value Ref Range   Enterococcus species NOT DETECTED NOT DETECTED   Listeria monocytogenes NOT DETECTED NOT DETECTED   Staphylococcus species DETECTED (A) NOT DETECTED   Staphylococcus aureus (BCID) NOT DETECTED NOT DETECTED   Methicillin resistance NOT DETECTED NOT DETECTED   Streptococcus species NOT DETECTED NOT DETECTED   Streptococcus agalactiae NOT DETECTED NOT DETECTED   Streptococcus pneumoniae NOT DETECTED NOT DETECTED   Streptococcus pyogenes NOT DETECTED NOT DETECTED   Acinetobacter baumannii NOT DETECTED NOT DETECTED   Enterobacteriaceae species NOT DETECTED NOT DETECTED   Enterobacter cloacae complex NOT DETECTED NOT DETECTED   Escherichia coli NOT DETECTED NOT DETECTED   Klebsiella oxytoca NOT DETECTED NOT DETECTED   Klebsiella pneumoniae NOT DETECTED NOT DETECTED   Proteus species NOT DETECTED NOT DETECTED   Serratia marcescens NOT DETECTED NOT DETECTED   Haemophilus influenzae NOT DETECTED NOT DETECTED   Neisseria meningitidis NOT DETECTED NOT DETECTED   Pseudomonas aeruginosa NOT DETECTED NOT DETECTED   Candida  albicans NOT DETECTED NOT DETECTED   Candida glabrata NOT DETECTED NOT DETECTED   Candida krusei NOT DETECTED NOT DETECTED   Candida parapsilosis NOT DETECTED NOT DETECTED   Candida tropicalis NOT DETECTED NOT DETECTED    Ashley Weiss 11/11/2018  12:09 PM

## 2018-11-11 NOTE — Consult Note (Signed)
Cardiology Consultation:   Patient ID: Ashley Weiss MRN: [id number]; DOB: Sep 10, 1919  Admit date: 11/10/2018 Date of Consult: 11/11/2018  Primary Care Provider: Lavera Guise, MD Primary Cardiologist: Kathlyn Sacramento, MD  Primary Electrophysiologist:  None    Patient Profile:   Ashley Weiss is a 85 y.o. female with a hx of paroxysmal atrial fibrillation with rapid ventricular response, possible tachybradycardia syndrome, hyperlipidemia, essential hypertension, and who is being seen today for the evaluation of atrial fibrillation with rapid ventricular response at the request of Gardiner Barefoot, NP.  History of Present Illness:   Ashley Weiss is a 85 year old female with PMH as above and symptomatic paroxysmal atrial fibrillation.  She was reportedly been on metoprolol 150 mg twice daily and digoxin 0.25 mg once daily for many years to treat her atrial fibrillation.  Of note, she uses oxygen at home.  She also has reportedly been living with her daughters as of the last month.  She was most recently admitted to [facility] 6/20 with dizziness and weakness in the setting of atrial fibrillation with RVR and ventricular rate of 146 bpm.  She was given IV metoprolol, digoxin, and diltiazem with improvement in her heart rate.  Diltiazem was then added to her medication regimen during this hospitalization and in addition to low-dose Eliquis 2.33m BID for anticoagulation (ordered 10/12/2018 on review of EMR).  She underwent an echocardiogram that showed normal LV systolic function with moderately dilated left atrium, mild aortic stenosis, mild to moderate tricuspid regurgitation, and mild pulmonary hypertension.  Since that time, [facility]  Digoxin has subsequently been discontinued and the low-dose diltiazem increased.  Benicar has also been discontinued to allow for increased dose of diltiazem.  This allowed for slight improvement in her heart rate; however, she was reportedly  still often tachycardic. As a result, short acting diltiazem was added over the phone to be used on a PRN basis. [facility] and for her follow-up of atrial fibrillation / atrial flutter with planned outpatient cardioversion for this upcoming Monday 7/27.   On 7/20, the patient's daughter reportedly called the office after the patient woke up with her nasal canula out of her nose and c/o symptomatic atrial fibrillation with her ventricular rate had been fluctuating with rates as high as the 140s. Per patient, she felt as if her heart was beating hard against her chest but denied chest pain. She also denied any nausea, emesis, or diarrhea. She denied any recent illness or exposure to illness. She also denied recent time outside in the heat. O2 saturations were reported at 91-95% on oxygen per daughter. The patient herself denied any SOB at that time; however, per daughter, the patient did experience some SOB and wheezing. The patient also denied any recent LEE. She reported she feels her atrial fibrillation the most when laying flat in bed and that it has intermittently been bad over the last month.   Of note, as indicated above, the patient does have a history of possible tachy-brady syndrome with slower heart rates recorded in the past on review of EMR. She confirmed medication compliance and has taken her Eliquis as prescribed every day and without missing any doses. She reported her last meal as 7/21 at 8PM.   Heart Pathway Score:     Past Medical History:  Diagnosis Date   Arthritis    Asthma    [place]  Depression    DVT (deep venous thrombosis) ([place])    Hyperlipidemia    Hypertension    Phlebitis     Past Surgical History:  Procedure Laterality Date   BREAST CYST ASPIRATION Left 20+ yrs ago   CATARACT EXTRACTION Bilateral 2005,2009   cyst removal-right shoulder  1972   otosclerosis Left 1988   hardening of bone,  other 1989 redone   THYROIDECTOMY  1970   nodule and 1/2 bridge removal   WRIST SURGERY Left 11/29/2009     Home Medications:  Prior to Admission medications   Medication Sig Start Date End Date Taking? Authorizing Provider  apixaban (ELIQUIS) 2.5 MG TABS tablet Take 1 tablet (2.5 mg total) by mouth 2 (two) times daily. 11/05/18  Yes Wellington Hampshire, MD  denosumab (PROLIA) 60 MG/ML SOSY injection Inject 60 mg into the skin every 6 (six) months.   Yes [provider]  diltiazem (CARDIZEM) 60 MG tablet Take 30-60 mg by mouth 4 (four) times daily.   Yes [provider]  diltiazem (DILACOR XR) 180 MG 24 hr capsule Take 1 capsule (180 mg total) by mouth daily. 10/29/18  Yes Wellington Hampshire, MD  fluticasone (FLONASE) 50 MCG/ACT nasal spray Place 1 spray into both nostrils daily as needed for allergies or rhinitis.   Yes [provider]  fluticasone (FLOVENT HFA) 110 MCG/ACT inhaler Inhale 2 puffs into the lungs 2 (two) times daily. 09/30/18  Yes Scarboro, Audie Clear, NP  ipratropium-albuterol (DUONEB) 0.5-2.5 (3) MG/3ML SOLN Take 3 mLs by nebulization 3 (three) times daily as needed. Use 1 vial 3 times daily as needed for SOB dia j45.1 09/30/18 12/29/18 Yes Scarboro, Audie Clear, NP  levothyroxine (SYNTHROID, LEVOTHROID) 25 MCG tablet TAKE 1 DAILY BY MOUTH IN THE MORNING ON AN EMPTY STOMACH Patient taking differently: Take 25 mcg by mouth daily before breakfast. TAKE 1 DAILY BY MOUTH IN THE MORNING ON AN EMPTY STOMACH 06/24/18  Yes Scarboro, Audie Clear, NP  metoprolol tartrate (LOPRESSOR) 100 MG tablet Take 1.5 tablets twice daily. Patient taking differently: Take 150 mg by mouth 2 (two) times daily. Take 1.5 tablets twice daily.  09/30/18  Yes Scarboro, Audie Clear, NP  mometasone (ELOCON) 0.1 % lotion Apply 4 drops topically at bedtime as needed for itching. 09/14/18  Yes [provider]  montelukast (SINGULAIR) 10 MG tablet TAKE 1 TABLET BY MOUTH EVERY DAY FOR COUGH Patient taking  differently: Take 10 mg by mouth daily. TAKE 1 TABLET BY MOUTH EVERY DAY FOR COUGH 06/18/18  Yes Boscia, Greer Ee, NP  Multiple Vitamins-Minerals (MULTIVITAMIN WITH MINERALS) tablet Take 1 tablet by mouth daily.   Yes [provider]  rosuvastatin (CRESTOR) 5 MG tablet Take 1 tablet (5 mg total) by mouth daily at 6 PM. 10/12/18  Yes Fritzi Mandes, MD  sertraline (ZOLOFT) 100 MG tablet Take 1 tablet (100 mg total) by mouth daily. 03/23/18  Yes Boscia, Greer Ee, NP  vitamin C (ASCORBIC ACID) 500 MG tablet Take 500 mg by mouth daily.   Yes [provider]  albuterol (PROVENTIL HFA;VENTOLIN HFA) 108 (90 Base) MCG/ACT inhaler Inhale 2 puffs into the lungs every 6 (six) hours as needed for wheezing or shortness of breath. 07/08/17   Ronnell Freshwater, NP  Blood Glucose Monitoring Suppl (FREESTYLE FREEDOM LITE) w/Device KIT 1 kit by Does not apply route daily. Patient not taking: Reported on 11/10/2018 05/18/18   Kendell Bane, NP  glucose blood (FREESTYLE LITE) test strip Use as directed  once a daily Diag E11.65 Patient not taking: Reported on 11/10/2018 05/19/18   Ronnell Freshwater, NP  Lancets (FREESTYLE) lancets Use as directed once daily diag e11.65 Patient not taking: Reported on 11/10/2018 05/19/18   Ronnell Freshwater, NP  OXYGEN Inhale 2 L into the lungs every evening. Sleep with nasal cannula w/ O2 at 2lpm.    [provider]    Inpatient Medications: Scheduled Meds:  apixaban  2.5 mg Oral BID   budesonide  2 mL Inhalation BID   diltiazem  180 mg Oral Daily   furosemide  20 mg Intravenous Daily   levothyroxine  25 mcg Oral QAC breakfast   mouth rinse  15 mL Mouth Rinse BID   metoprolol tartrate  150 mg Oral BID   montelukast  10 mg Oral Daily   rosuvastatin  5 mg Oral q1800   sertraline  100 mg Oral Daily   sodium chloride flush  3 mL Intravenous Q12H   vitamin C  500 mg Oral Daily   Continuous Infusions:  sodium chloride     diltiazem (CARDIZEM)  infusion 5 mg/hr (11/11/18 0351)   PRN Meds: sodium chloride, acetaminophen, ondansetron (ZOFRAN) IV, sodium chloride flush  Allergies:    Allergies  Allergen Reactions   Hydralazine Itching and Swelling   Biaxin [Clarithromycin] Nausea And Vomiting   Ciprofloxacin Other (See Comments)    Hand cramping    Meloxicam Other (See Comments)    Unknown   Minocycline Other (See Comments)    Unknown    Social History:   Social History   Socioeconomic History   Marital status: Widowed    Spouse name: Not on file   Number of children: Not on file   Years of education: Not on file   Highest education level: Not on file  Occupational History   Not on file  Social Needs   Financial resource strain: Not on file   Food insecurity    Worry: Not on file    Inability: Not on file   Transportation needs    Medical: Not on file    Non-medical: Not on file  Tobacco Use   Smoking status: Never Smoker   Smokeless tobacco: Never Used  Substance and Sexual Activity   Alcohol use: No   Drug use: No   Sexual activity: Never  Lifestyle   Physical activity    Days per week: Not on file    Minutes per session: Not on file   Stress: Not on file  Relationships   Social connections    Talks on phone: Not on file    Gets together: Not on file    Attends religious service: Not on file    Active member of club or organization: Not on file    Attends meetings of clubs or organizations: Not on file    Relationship status: Not on file   Intimate partner violence    Fear of current or ex partner: Not on file    Emotionally abused: Not on file    Physically abused: Not on file    Forced sexual activity: Not on file  Other Topics Concern   Not on file  Social History Narrative   Not on file    Family History:    Family History  Problem Relation Age of Onset   Osteoarthritis Mother    Depression Father    Breast cancer Neg Hx      ROS:  Please see the  history of  present illness.  Review of Systems  Respiratory: Positive for shortness of breath and wheezing. Negative for hemoptysis.        SOB and wheezing per daughter  Cardiovascular: Negative for chest pain, palpitations and leg swelling.       Elevated rates with laying flat Feels elevated rates as if pounding in her chest  Gastrointestinal: Negative for abdominal pain, blood in stool, constipation, diarrhea, heartburn, melena, nausea and vomiting.  All other systems reviewed and are negative.   All other ROS reviewed and negative.     Physical Exam/Data:   Vitals:   11/10/18 2030 11/10/18 2057 11/10/18 2339 11/11/18 0410  BP: (!) 136/94 (!) 136/94 123/80 117/72  Pulse: (!) 139 (!) 138 72 67  Resp: (!) 26 20 16 16   Temp:   97.8 F (36.6 C) 97.8 F (36.6 C)  TempSrc:   Oral Oral  SpO2: 94% 94% 94% 95%  Weight:    59.3 kg  Height:   5' 2"  (1.575 m)     Intake/Output Summary (Last 24 hours) at 11/11/2018 0727 Last data filed at 11/11/2018 0351 Gross per 24 hour  Intake 52.58 ml  Output --  Net 52.58 ml   Last 3 Weights 11/11/2018 11/10/2018 11/05/2018  Weight (lbs) 130 lb 11.7 oz 138 lb 130 lb  Weight (kg) 59.3 kg 62.596 kg 58.968 kg     Body mass index is 23.91 kg/m.  General:  Elderly female in NAD HEENT: normal Neck: no JVD Vascular: No carotid bruits; radial pulses 2+ bilaterally Cardiac:  normal S1, H2;ZYYQ; 1/6 systolic murmur Lungs:  Reduced breath sounds bilaterally (poor inspiratory effort), expiratory weeze. No rhonchi or rales  Abd: soft, nontender, no hepatomegaly  Ext: No bilateral LEE Musculoskeletal:  No deformities, BUE and BLE strength normal and equal Skin: warm and dry  Neuro:  No focal abnormalities noted Psych:  Normal affect   EKG:  The EKG was personally reviewed and demonstrates:  Atrial fibrillation with rapid ventricular response, ventricular rate of 126 bpm, right axis deviation, IVCD with LPFB versus lead reversal Telemetry:  Telemetry  was personally reviewed and demonstrates:  Atrial fibrillation versus flutter with ventricular rates mid 60s-139 Relevant CV Studies:  Echo  10/22/2018 See scan under CV procedures tab: She underwent an echocardiogram that showed normal LV systolic function with moderately dilated left atrium, mild aortic stenosis, mild to moderate tricuspid regurgitation, and mild pulmonary hypertension.    Laboratory Data:  High Sensitivity Troponin:   Recent Labs  Lab 11/11/18 0010 11/11/18 0317  TROPONINIHS 5 5     Cardiac EnzymesNo results for input(s): TROPONINI in the last 168 hours. No results for input(s): TROPIPOC in the last 168 hours.  Chemistry Recent Labs  Lab 11/05/18 1136 11/10/18 1746 11/11/18 0317  NA 136 133* 136  K 4.5 4.5 4.2  CL 94* 95* 98  CO2 25 27 31   GLUCOSE 143* 142* 136*  BUN 15 19 16   CREATININE 0.50* 0.55 0.56  CALCIUM 9.1 8.9 8.5*  GFRNONAA 82 >60 >60  GFRAA 94 >60 >60  ANIONGAP  --  11 7    Recent Labs  Lab 11/10/18 1746  PROT 7.9  ALBUMIN 3.5  AST 47*  ALT 56*  ALKPHOS 69  BILITOT 0.7   Hematology Recent Labs  Lab 11/05/18 1136 11/10/18 1746 11/11/18 0317  WBC 8.8 9.2 9.3  RBC 3.79 3.71* 3.52*  HGB 12.2 11.9* 11.4*  HCT 37.6 37.0 35.5*  MCV 99* 99.7 100.9*  MCH 32.2  32.1 32.4  MCHC 32.4 32.2 32.1  RDW 12.4 14.0 13.9  PLT 228 227 195   BNP Recent Labs  Lab 11/10/18 1746 11/11/18 0010  BNP 312.0* 283.0*    DDimer No results for input(s): DDIMER in the last 168 hours.   Radiology/Studies:  Dg Chest Portable 1 View  Result Date: 11/10/2018 CLINICAL DATA:  Shortness of breath EXAM: PORTABLE CHEST 1 VIEW COMPARISON:  10/10/2018 FINDINGS: Cardiac shadow remains enlarged. Aortic calcifications are seen. Bilateral pleural effusions right greater than left are now seen new from the prior exam. Underlying right basilar infiltrate is likely present. Mild vascular congestion is noted without interstitial edema. No bony abnormality is noted.  IMPRESSION: New pleural effusions and right basilar infiltrate. Increase in vascular congestion. Electronically Signed   By: Inez Catalina M.D.   On: 11/10/2018 18:15    Assessment and Plan:   Atrial Fibrillation with RVR - Ventricular rates improved from yesterday (with into 140s); however, patient still remains symptomatic with SOB/wheezing noted on exam and c/o feeling her heart beat against her chest. Telemetry shows Afib / Aflutter this AM. As in HPI, difficulty controlling rates in the past due to underlying bradycardia in SR.  - Plan for DCCV today 7/22. Risks and benefits have been discussed with patient and daughter.  - NPO since 8PM on 7/21. Continue in preparation for DCCV. - Patient has not missed any doses of Eliquis 2.5 mg twice daily.  Continue anticoagulation. -Continue holding any rate reducing medications including metoprolol and diltiazem this morning in preparation for DCCV as patient has a history of possible tachybradycardia syndrome with concern for bradycardia after DCCV and once in NSR. - Further recommendations after DCCV today 7/22. Will need to cancel previously scheduled DCCV for 7/27 following today's procedure.     For questions or updates, please contact [place] Please consult [email or web address] for contact info under     Signed, Arvil Chaco, PA-C  11/11/2018 7:27 AM

## 2018-11-11 NOTE — Plan of Care (Signed)
Pt continues on cardizem gtt now @ 5mg /hr, rate controlled but still aflutter.   Problem: Nutrition: Goal: Adequate nutrition will be maintained Outcome: Progressing   Problem: Coping: Goal: Level of anxiety will decrease Outcome: Progressing   Problem: Pain Managment: Goal: General experience of comfort will improve Outcome: Progressing Note: No complaints of pain this shift   Problem: Safety: Goal: Ability to remain free from injury will improve Outcome: Progressing   Problem: Skin Integrity: Goal: Risk for impaired skin integrity will decrease Outcome: Progressing   Problem: Cardiac: Goal: Ability to achieve and maintain adequate cardiopulmonary perfusion will improve Outcome: Progressing

## 2018-11-11 NOTE — Progress Notes (Signed)
PT Cancellation Note  Patient Details Name: Ashley Weiss MRN: [id number] DOB: Sep 19, 1919   Cancelled Treatment:    Reason Eval/Treat Not Completed: Other (comment)(Pt scheduled for cardioversion today. PT will follow up when patient is more medically appropriate for exertional activities.)   Lieutenant Diego PT, DPT 8:56 AM,11/11/18 [phone]

## 2018-11-11 NOTE — Anesthesia Postprocedure Evaluation (Signed)
Anesthesia Post Note  Patient: Ashley Weiss  Procedure(s) Performed: CARDIOVERSION (N/A )  Patient location during evaluation: Specials Recovery Anesthesia Type: General Level of consciousness: awake and alert Pain management: pain level controlled Vital Signs Assessment: post-procedure vital signs reviewed and stable Respiratory status: spontaneous breathing, nonlabored ventilation, respiratory function stable and patient connected to nasal cannula oxygen Cardiovascular status: blood pressure returned to baseline and stable Postop Assessment: no apparent nausea or vomiting Anesthetic complications: no     Last Vitals:  Vitals:   11/11/18 0943 11/11/18 1000  BP: 111/69 123/67  Pulse: 70 72  Resp: 18 16  Temp:    SpO2: 97% 98%    Last Pain:  Vitals:   11/11/18 1000  TempSrc:   PainSc: 0-No pain                 Martha Clan

## 2018-11-11 NOTE — Progress Notes (Signed)
*  PRELIMINARY RESULTS* Echocardiogram 2D Echocardiogram has been performed.  Sherrie Sport 11/11/2018, 2:24 PM

## 2018-11-11 NOTE — Progress Notes (Signed)
[facility]  Date: 11/11/18 Time: 4:57 PM  Patient reevaluated and reports feeling better status post cardioversion today.  She still requiring supplemental oxygen.  Echocardiogram reviewed, demonstrating normal LVEF with moderate mitral and tricuspid regurgitation.  There is evidence of pleural effusion, as noted on yesterday's chest radiograph.  I think the patient would benefit from at least 1 more day of IV diuresis.  Will increase furosemide to 20 mg IV twice daily.  If she has recurrent shortness of breath or continued hypoxia, therapeutic thoracentesis may need to be considered.  Nelva Bush, MD [facility] Pager: [phone]

## 2018-11-11 NOTE — Progress Notes (Signed)
Advanced care plan. Purpose of the Encounter: CODE STATUS Parties in Attendance:Patient Patient's Decision Capacity:Good Subjective/Patient's story: Ashley Weiss  is a 85 y.o. female with a known history of atrial fibrillation, CHF, COPD, hypertension, hyperlipidemia. She presented to the emergency room for evaluation of "fast heart rate "with shortness of breath and cough over the last 2 to 3 days.  She has a history of chronic respiratory failure and is on oxygen at 2 L/min per nasal cannula at home.  She reports a history of atrial fibrillation with difficulty controlling the rate over the last few weeks.  She has been seeing Dr. Fletcher Anon and is planned for cardioversion on this upcoming Monday according to the patient.  Patient is currently taking diltiazem 180 mg daily as well as metoprolol tartrate 150 mg daily.  She is on Eliquis 2.5 mg twice daily as well.  She was found to be in atrial fibrillation with RVR with heart rate sustained in the 140s in the emergency room.  She was started on diltiazem infusion. She denies chest pain, nausea, vomiting, diarrhea.  She denies abdominal pain.  She denies fevers or chills.  She is experiencing mild palpitations.  She reports shortness of breath and hacking type nonproductive cough associated with elevated heart rate over the last few days. Objective/Medical story Patient found to have afib with rvr. Needs IV diltiazem infusion for rate control. Needs lasix iv for diuresis for chf. Goals of care determination:  Advance care directives and goals of care discussed. Patient does not want cpr, intubation and ventilator if the need arises. CODE STATUS: DNR Time spent discussing advanced care planning: 16 minutes

## 2018-11-11 NOTE — Progress Notes (Signed)
[facility] [place] NAME: Ashley Weiss    MR#:  [id number]  DATE OF BIRTH:  [birth year]  SUBJECTIVE:  CHIEF COMPLAINT:   Chief Complaint  Patient presents with  . Shortness of Breath   Came with shortness of breath and palpitation.  Noted to be in A. fib RVR.  She was scheduled to have outpatient cardioversion next week.  Seen by cardiologist and cardioversion was done today morning and she was feeling little bit better.  She still felt short of breath on ambulation today.  REVIEW OF SYSTEMS:  CONSTITUTIONAL: No fever, fatigue or weakness.  EYES: No blurred or double vision.  EARS, NOSE, AND THROAT: No tinnitus or ear pain.  RESPIRATORY: No cough, have shortness of breath, no wheezing or hemoptysis.  CARDIOVASCULAR: No chest pain, orthopnea, edema.  GASTROINTESTINAL: No nausea, vomiting, diarrhea or abdominal pain.  GENITOURINARY: No dysuria, hematuria.  ENDOCRINE: No polyuria, nocturia,  HEMATOLOGY: No anemia, easy bruising or bleeding SKIN: No rash or lesion. MUSCULOSKELETAL: No joint pain or arthritis.   NEUROLOGIC: No tingling, numbness, weakness.  PSYCHIATRY: No anxiety or depression.   ROS  DRUG ALLERGIES:   Allergies  Allergen Reactions  . Hydralazine Itching and Swelling  . Biaxin [Clarithromycin] Nausea And Vomiting  . Ciprofloxacin Other (See Comments)    Hand cramping   . Meloxicam Other (See Comments)    Unknown  . Minocycline Other (See Comments)    Unknown    VITALS:  Blood pressure 123/67, pulse 80, temperature 98.4 F (36.9 C), temperature source Oral, resp. rate 16, height 5\' 2"  (1.575 m), weight 59.3 kg, SpO2 98 %.  PHYSICAL EXAMINATION:  GENERAL:  85 y.o.-year-old patient lying in the bed with no acute distress.  EYES: Pupils equal, round, reactive to light and accommodation. No scleral icterus. Extraocular muscles intact.  HEENT: Head atraumatic, normocephalic. Oropharynx and nasopharynx clear.   NECK:  Supple, no jugular venous distention. No thyroid enlargement, no tenderness.  LUNGS: Normal breath sounds bilaterally, no wheezing, some crepitation. No use of accessory muscles of respiration.  CARDIOVASCULAR: S1, S2 normal. No murmurs, rubs, or gallops.  ABDOMEN: Soft, nontender, nondistended. Bowel sounds present. No organomegaly or mass.  EXTREMITIES: No pedal edema, cyanosis, or clubbing.  NEUROLOGIC: Cranial nerves II through XII are intact. Muscle strength 5/5 in all extremities. Sensation intact. Gait not checked.  PSYCHIATRIC: The patient is alert and oriented x 3.  SKIN: No obvious rash, lesion, or ulcer.   Physical Exam LABORATORY PANEL:   CBC Recent Labs  Lab 11/11/18 0317  WBC 9.3  HGB 11.4*  HCT 35.5*  PLT 195   ------------------------------------------------------------------------------------------------------------------  Chemistries  Recent Labs  Lab 11/10/18 1746 11/11/18 0317  NA 133* 136  K 4.5 4.2  CL 95* 98  CO2 27 31  GLUCOSE 142* 136*  BUN 19 16  CREATININE 0.55 0.56  CALCIUM 8.9 8.5*  MG 2.1  --   AST 47*  --   ALT 56*  --   ALKPHOS 69  --   BILITOT 0.7  --    ------------------------------------------------------------------------------------------------------------------  Cardiac Enzymes No results for input(s): TROPONINI in the last 168 hours. ------------------------------------------------------------------------------------------------------------------  RADIOLOGY:  Dg Chest Portable 1 View  Result Date: 11/10/2018 CLINICAL DATA:  Shortness of breath EXAM: PORTABLE CHEST 1 VIEW COMPARISON:  10/10/2018 FINDINGS: Cardiac shadow remains enlarged. Aortic calcifications are seen. Bilateral pleural effusions right greater than left are now seen new from the prior exam. Underlying right basilar infiltrate  is likely present. Mild vascular congestion is noted without interstitial edema. No bony abnormality is noted. IMPRESSION: New  pleural effusions and right basilar infiltrate. Increase in vascular congestion. Electronically Signed   By: Inez Catalina M.D.   On: 11/10/2018 18:15    ASSESSMENT AND PLAN:   Active Problems:   Atrial fibrillation with RVR (HCC)  1.  Atrial fibrillation with RVR - was on diltiazem infusion -Continue telemetry monitoring - Cardiology consulted-status post cardioversion, stopped IV Cardizem and started oral Cardizem. -O2 per nasal cannula -Continue Eliquis  2.  CHF with mild exacerbation - Patient received IV Lasix in the emergency room -Continue IV Lasix -O2 at 2 L per nasal cannula - Beta-blocker therapy stopped by cardiologist - Echocardiogram ordered -As patient continues to have some shortness of breath on exertion, cardiology suggested to monitor in hospital and if does not improve then may need therapeutic thoracocentesis  3.  COPD - DuoNeb every 6 hours - O2 per nasal cannula   DVT and PPI prophylaxis continued    All the records are reviewed and case discussed with Care Management/Social Workerr. Management plans discussed with the patient, family and they are in agreement.  CODE STATUS: DNR  TOTAL TIME TAKING CARE OF THIS PATIENT: 35 minutes.   I spoke to patient's daughter on the phone to explain patient's condition and improvement and she agreed with the overall plan of monitoring in the hospital tonight.  POSSIBLE D/C IN 1-2 DAYS, DEPENDING ON CLINICAL CONDITION.   Ashley Weiss M.D on 11/11/2018   Between 7am to 6pm - Pager - [phone]  After 6pm go to [email or web address] - password [email or web address] [facility]  Office  [phone]  CC: Primary care physician; Ashley Guise, MD  Note: This dictation was prepared with Dragon dictation along with smaller phrase technology. Any transcriptional errors that result from this process are unintentional.

## 2018-11-11 NOTE — Evaluation (Signed)
Physical Therapy Evaluation Patient Details Name: Ashley Weiss MRN: [id number] DOB: 09-May-1919 Today's Date: 11/11/2018   History of Present Illness  Pt is 85 yo female that presented to the emergency room for evaluation of "fast heart rate" with shortness of breath and cough over the last 2 to 3 days.  Workup revealed atrial fibrilation/a flutter with RVR, S/p cardioversion 11/11/2018. PMH of afib, CHF, COPD with 2L at baseline, HTN, HLD, DVT, asthma, skin cancer.    Clinical Impression  Patient woke to PT touch, PT voice, and remained alert, oriented, behavior WFLs throughout session, HOH noted. Pt reported that she lives alone and has family that checks on her at least once a week or that she can call and they'll come see her, mod I with ADLs, ambulation with walking stick intermittently. Patient endorsed several falls in the last 6 months and that she normally just gets herself back up.  Upon assessment pt demonstrated generalized weakness of LE, bed mobility mod I, and sat EOB for a few minutes with good balance. Sit <> stand with RW and CGA, 1-2 attempts for successful transfer without physical assist from PT. Pt ambulated ~155ft on 2L via [place]. Exhibited increased gait velocity and unsteadiness with increased speed. PT and pt discussed importance of activity pacing and proper RW use to increase safety at home and decrease risk of falls. Pt stated that she felt not quite back to her normal self that she was "more tired" than she usually is after walking. Overall the patient demonstrated mild deficits in functional mobility compared to PLOF and would benefit from further skilled PT to maximize safety, mobility, and decrease risk of falls. Recommednation is HHPT with intermittent supervision.     Follow Up Recommendations Home health PT;Supervision - Intermittent    Equipment Recommendations  Rolling walker with 5" wheels    Recommendations for Other Services       Precautions /  Restrictions Precautions Precautions: Fall      Mobility  Bed Mobility Overal bed mobility: Modified Independent                Transfers Overall transfer level: Needs assistance Equipment used: Rolling walker (2 wheeled) Transfers: Sit to/from Stand Sit to Stand: Supervision;Min guard            Ambulation/Gait Ambulation/Gait assistance: Min guard Gait Distance (Feet): 190 Feet Assistive device: Rolling walker (2 wheeled)       General Gait Details: Pt ambulates very quickly, cues to slow activity pacing to increase safety. Instability noted when patient turns 1 LOB corrected by PT. HR monitored throughout, [facility]  Stairs            Wheelchair Mobility    Modified Rankin (Stroke Patients Only)       Balance Overall balance assessment: Needs assistance Sitting-balance support: Feet supported Sitting balance-Leahy Scale: Good       Standing balance-Leahy Scale: Fair                 High Level Balance Comments: Pt experiences instability with turns             Pertinent Vitals/Pain Pain Assessment: No/denies pain    Home Living Family/patient expects to be discharged to:: Private residence Living Arrangements: Alone Available Help at Discharge: Family;Available PRN/intermittently Type of Home: House Home Access: Stairs to enter Entrance Stairs-Rails: Left;Right;Can reach both Entrance Stairs-Number of Steps: 4 Home Layout: One level Home Equipment: Walker - standard Additional Comments: walking stick that the patient  will sometimes use in house, uses O2 at night    Prior Function Level of Independence: Independent with assistive device(s)         Comments: Pt reported "a couple"  falls in the last 6 months. Stated that she may "overbalance" and that she is most often able to get herself right back up     Hand Dominance        Extremity/Trunk Assessment   Upper Extremity Assessment Upper Extremity Assessment: Overall  WFL for tasks assessed    Lower Extremity Assessment Lower Extremity Assessment: Generalized weakness    Cervical / Trunk Assessment Cervical / Trunk Assessment: Kyphotic  Communication   Communication: HOH  Cognition Arousal/Alertness: Awake/alert Behavior During Therapy: WFL for tasks assessed/performed Overall Cognitive Status: Within Functional Limits for tasks assessed                                        General Comments      Exercises     Assessment/Plan    PT Assessment Patient needs continued PT services  PT Problem List Decreased mobility;Decreased activity tolerance;Decreased balance       PT Treatment Interventions DME instruction;Therapeutic exercise;Gait training;Balance training;Neuromuscular re-education;Functional mobility training;Therapeutic activities;Patient/family education    PT Goals (Current goals can be found in the Care Plan section)  Acute Rehab PT Goals Patient Stated Goal: to go home PT Goal Formulation: With patient Time For Goal Achievement: 11/25/18 Potential to Achieve Goals: Good    Frequency Min 2X/week   Barriers to discharge        Co-evaluation               AM-PAC PT "6 Clicks" Mobility  Outcome Measure Help needed turning from your back to your side while in a flat bed without using bedrails?: None Help needed moving from lying on your back to sitting on the side of a flat bed without using bedrails?: None Help needed moving to and from a bed to a chair (including a wheelchair)?: A Little Help needed standing up from a chair using your arms (e.g., wheelchair or bedside chair)?: A Little Help needed to walk in hospital room?: A Little Help needed climbing 3-5 steps with a railing? : A Little 6 Click Score: 20    End of Session Equipment Utilized During Treatment: Gait belt;Other (comment);Oxygen(2L) Activity Tolerance: Patient tolerated treatment well Patient left: in chair;with chair alarm  set;with call bell/phone within reach Nurse Communication: Mobility status PT Visit Diagnosis: Other abnormalities of gait and mobility (R26.89);Unsteadiness on feet (R26.81)    Time: 9675-9163 PT Time Calculation (min) (ACUTE ONLY): 25 min   Charges:   PT Evaluation $PT Eval Moderate Complexity: 1 Mod PT Treatments $Therapeutic Exercise: 8-22 mins       Lieutenant Diego PT, DPT 1:51 PM,11/11/18 [phone]

## 2018-11-11 NOTE — Transfer of Care (Signed)
Immediate Anesthesia Transfer of Care Note  Patient: Ashley Weiss  Procedure(s) Performed: CARDIOVERSION (N/A )  Patient Location: PACU  Anesthesia Type:General  Level of Consciousness: drowsy  Airway & Oxygen Therapy: Patient Spontanous Breathing and Patient connected to nasal cannula oxygen  Post-op Assessment: Report given to RN and Post -op Vital signs reviewed and stable  Post vital signs: Reviewed and stable  Last Vitals:  Vitals Value Taken Time  BP 101/63 11/11/18 0918  Temp    Pulse 69 11/11/18 0919  Resp 23 11/11/18 0919  SpO2 93 % 11/11/18 0919    Last Pain:  Vitals:   11/11/18 0856  TempSrc: Oral  PainSc: 0-No pain         Complications: No apparent anesthesia complications

## 2018-11-11 NOTE — CV Procedure (Signed)
    Cardioversion Note  Ashley Weiss [id number] [birth year]  Procedure: DC Cardioversion Indications: Atrial flutter with rapid ventricular rates  Procedure Details Consent: Obtained Time Out: Verified patient identification, verified procedure, site/side was marked, verified correct patient position, special equipment/implants available, Radiology Safety Procedures followed,  medications/allergies/relevent history reviewed, required imaging and test results available.  Performed  The patient has been on adequate anticoagulation.  The patient received IV propofol by anesthesia for sedation.  Synchronous cardioversion was performed at 120 joules x 1.  The cardioversion was successful with restoration of normal sinus rhythm.  Complications: No apparent complications Patient did tolerate procedure well.  Plan: Hold oral metoprolol.  Continue diltiazem 180 mg daily.  Gentle diuresis with furosemide 20 mg IV daily.  Follow-up transthoracic echocardiogram.  Nelva Bush., MD 11/11/2018, 9:15 AM

## 2018-11-11 NOTE — Progress Notes (Signed)
Patient off floor at this time for cardioversion. Report given to [facility] RN in specials by this RN.  No complaints prior to transfer.

## 2018-11-11 NOTE — Anesthesia Preprocedure Evaluation (Signed)
Anesthesia Evaluation  Patient identified by MRN, date of birth, ID band Patient awake    Reviewed: Allergy & Precautions, H&P , NPO status , Patient's Chart, lab work & pertinent test results, reviewed documented beta blocker date and time   History of Anesthesia Complications Negative for: history of anesthetic complications  Airway Mallampati: III  TM Distance: >3 FB Neck ROM: full    Dental  (+) Poor Dentition, Partial Upper, Dental Advidsory Given   Pulmonary shortness of breath and with exertion, asthma , COPD,  COPD inhaler,    Pulmonary exam normal        Cardiovascular Exercise Tolerance: Good hypertension, (-) angina(-) Past MI and (-) Cardiac Stents + dysrhythmias Atrial Fibrillation + Valvular Problems/Murmurs      Neuro/Psych PSYCHIATRIC DISORDERS Depression negative neurological ROS     GI/Hepatic negative GI ROS, Neg liver ROS,   Endo/Other  diabetes (borderline)Hypothyroidism   Renal/GU negative Renal ROS  negative genitourinary   Musculoskeletal   Abdominal   Peds  Hematology negative hematology ROS (+)   Anesthesia Other Findings Past Medical History: No date: Arthritis No date: Asthma No date: Cancer ([place])     Comment:  skin No date: Depression No date: DVT (deep venous thrombosis) (HCC) No date: Hyperlipidemia No date: Hypertension No date: Phlebitis   Reproductive/Obstetrics negative OB ROS                             Anesthesia Physical Anesthesia Plan  ASA: III  Anesthesia Plan: General   Post-op Pain Management:    Induction: Intravenous  PONV Risk Score and Plan: 3 and Propofol infusion and TIVA  Airway Management Planned: Natural Airway and Nasal Cannula  Additional Equipment:   Intra-op Plan:   Post-operative Plan:   Informed Consent: I have reviewed the patients History and Physical, chart, labs and discussed the procedure including the  risks, benefits and alternatives for the proposed anesthesia with the patient or authorized representative who has indicated his/her understanding and acceptance.     Dental Advisory Given  Plan Discussed with: Anesthesiologist, CRNA and Surgeon  Anesthesia Plan Comments:         Anesthesia Quick Evaluation

## 2018-11-11 NOTE — Progress Notes (Signed)
Updated daughter Ashley Weiss about current condition.

## 2018-11-12 ENCOUNTER — Other Ambulatory Visit: Admission: RE | Admit: 2018-11-12 | Payer: Medicare Other | Source: Ambulatory Visit

## 2018-11-12 ENCOUNTER — Inpatient Hospital Stay: Payer: Medicare Other

## 2018-11-12 ENCOUNTER — Ambulatory Visit: Payer: Self-pay | Admitting: Internal Medicine

## 2018-11-12 DIAGNOSIS — I5031 Acute diastolic (congestive) heart failure: Secondary | ICD-10-CM

## 2018-11-12 LAB — URINE CULTURE

## 2018-11-12 LAB — BASIC METABOLIC PANEL
Anion gap: 9 (ref 5–15)
BUN: 12 mg/dL (ref 8–23)
CO2: 35 mmol/L — ABNORMAL HIGH (ref 22–32)
Calcium: 8.9 mg/dL (ref 8.9–10.3)
Chloride: 94 mmol/L — ABNORMAL LOW (ref 98–111)
Creatinine, Ser: 0.47 mg/dL (ref 0.44–1.00)
GFR calc Af Amer: >60 mL/min (ref >60)
GFR calc non Af Amer: >60 mL/min (ref >60)
Glucose, Bld: 116 mg/dL — ABNORMAL HIGH (ref 70–99)
Potassium: 3.6 mmol/L (ref 3.5–5.1)
Sodium: 138 mmol/L (ref 135–145)

## 2018-11-12 MED ORDER — FUROSEMIDE 10 MG/ML IJ SOLN
40.0000 mg | Freq: Two times a day (BID) | INTRAMUSCULAR | Status: DC
  Administered 2018-11-12 – 2018-11-16 (×9): 40 mg via INTRAVENOUS
  Filled 2018-11-12 (×9): qty 4

## 2018-11-12 MED ORDER — METOPROLOL TARTRATE 50 MG PO TABS
100.0000 mg | ORAL_TABLET | Freq: Two times a day (BID) | ORAL | Status: DC
  Administered 2018-11-12 – 2018-11-16 (×8): 100 mg via ORAL
  Filled 2018-11-12 (×8): qty 2

## 2018-11-12 MED ORDER — METOPROLOL TARTRATE 50 MG PO TABS
50.0000 mg | ORAL_TABLET | Freq: Once | ORAL | Status: AC
  Administered 2018-11-12: 50 mg via ORAL
  Filled 2018-11-12: qty 1

## 2018-11-12 MED ORDER — METOPROLOL TARTRATE 50 MG PO TABS
50.0000 mg | ORAL_TABLET | Freq: Two times a day (BID) | ORAL | Status: DC
  Administered 2018-11-12: 50 mg via ORAL
  Filled 2018-11-12: qty 1

## 2018-11-12 MED ORDER — AMIODARONE HCL 200 MG PO TABS
400.0000 mg | ORAL_TABLET | Freq: Two times a day (BID) | ORAL | Status: DC
  Administered 2018-11-12 – 2018-11-13 (×3): 400 mg via ORAL
  Filled 2018-11-12 (×3): qty 2

## 2018-11-12 MED ORDER — FUROSEMIDE 10 MG/ML IJ SOLN: 40.0000 mg | Freq: Two times a day (BID) | INTRAMUSCULAR | Status: DC

## 2018-11-12 NOTE — Progress Notes (Signed)
   11/12/18 1900  What Happened  Was fall witnessed? Yes  Who witnessed fall? RN  Patients activity before fall ambulating-unassisted;other (comment) (ambulating to the bathroom with walker)  Point of contact buttocks;arm/shoulder (left elbow/FA)  Was patient injured? No  Follow Up  MD notified Dr. Rufina Falco  Time MD notified [phone]  Family notified Yes - comment  Time family notified 1907 (dtr-Donna)  Additional tests No  Progress note created (see row info) Yes  Adult Fall Risk Assessment  Risk Factor Category (scoring not indicated) Fall has occurred during this admission (document High fall risk)  Age 85  Fall History: Fall within 6 months prior to admission 0  Elimination; Bowel and/or Urine Incontinence 2  Elimination; Bowel and/or Urine Urgency/Frequency 2  Medications: includes PCA/Opiates, Anti-convulsants, Anti-hypertensives, Diuretics, Hypnotics, Laxatives, Sedatives, and Psychotropics 5  Patient Care Equipment 1  Mobility-Assistance 2  Mobility-Gait 0  Mobility-Sensory Deficit 0  Altered awareness of immediate physical environment 0  Impulsiveness 0  Lack of understanding of one's physical/cognitive limitations 0  Total Score 15  Patient Fall Risk Level High fall risk  Adult Fall Risk Interventions  Required Bundle Interventions *See Row Information* High fall risk - low, moderate, and high requirements implemented  Additional Interventions Use of appropriate toileting equipment (bedpan, BSC, etc.);Room near nurses station  Screening for Fall Injury Risk (To be completed on HIGH fall risk patients) - Assessing Need for Low Bed  Risk For Fall Injury- Low Bed Criteria Previous fall this admission  Will Implement Low Bed and Floor Mats Yes     11/12/18 1900  What Happened  Was fall witnessed? Yes  Who witnessed fall? RN  Patients activity before fall ambulating-unassisted;other (comment) (ambulating to the bathroom with walker)  Point of contact  buttocks;arm/shoulder (left elbow/FA)  Was patient injured? No  Follow Up  MD notified Dr. Rufina Falco  Time MD notified [phone]  Family notified Yes - comment  Time family notified 1907 (dtr-Donna)  Additional tests No  Progress note created (see row info) Yes  Adult Fall Risk Assessment  Risk Factor Category (scoring not indicated) Fall has occurred during this admission (document High fall risk)  Age 85  Fall History: Fall within 6 months prior to admission 0  Elimination; Bowel and/or Urine Incontinence 2  Elimination; Bowel and/or Urine Urgency/Frequency 2  Medications: includes PCA/Opiates, Anti-convulsants, Anti-hypertensives, Diuretics, Hypnotics, Laxatives, Sedatives, and Psychotropics 5  Patient Care Equipment 1  Mobility-Assistance 2  Mobility-Gait 0  Mobility-Sensory Deficit 0  Altered awareness of immediate physical environment 0  Impulsiveness 0  Lack of understanding of one's physical/cognitive limitations 0  Total Score 15  Patient Fall Risk Level High fall risk  Adult Fall Risk Interventions  Required Bundle Interventions *See Row Information* High fall risk - low, moderate, and high requirements implemented  Additional Interventions Use of appropriate toileting equipment (bedpan, BSC, etc.);Room near [place] for Fall Injury Risk (To be completed on HIGH fall risk patients) - Assessing Need for Low Bed  Risk For Fall Injury- Low Bed Criteria Previous fall this admission  Will Implement Low Bed and Floor Mats Yes

## 2018-11-12 NOTE — Progress Notes (Signed)
Progress Note  Patient Name: Ashley Weiss Date of Encounter: 11/12/2018  Primary Cardiologist: Kathlyn Sacramento, MD   Subjective   No chest pain reported.   Continued but improved SOB/DOE on [place] oxygen with DOE on ambulation yesterday (7/22) and after DCCV. Reports not yet back at baseline breathing status and noted racing HR with ambulation, as well as fatigue.   Inpatient Medications    Scheduled Meds: . apixaban  2.5 mg Oral BID  . budesonide  2 mL Inhalation BID  . diltiazem  180 mg Oral Daily  . furosemide  20 mg Intravenous BID  . ipratropium-albuterol  3 mL Nebulization TID  . levothyroxine  25 mcg Oral QAC breakfast  . mouth rinse  15 mL Mouth Rinse BID  . montelukast  10 mg Oral Daily  . rosuvastatin  5 mg Oral q1800  . sertraline  100 mg Oral Daily  . sodium chloride flush  3 mL Intravenous Q12H  . sodium chloride flush  3 mL Intravenous Q12H  . vitamin C  500 mg Oral Daily   Continuous Infusions: . sodium chloride     PRN Meds: sodium chloride, acetaminophen, albuterol, ondansetron (ZOFRAN) IV, sodium chloride flush, sodium chloride flush   Vital Signs    Vitals:   11/11/18 2012 11/12/18 0329 11/12/18 0808 11/12/18 0816  BP:  (!) 155/89 140/77   Pulse:  90 91 93  Resp:  16 20 16   Temp:  97.9 F (36.6 C) 98.4 F (36.9 C)   TempSrc:  Oral Oral   SpO2: 94% 92% 95% 90%  Weight:  59 kg    Height:        Intake/Output Summary (Last 24 hours) at 11/12/2018 0859 Last data filed at 11/12/2018 0850 Gross per 24 hour  Intake 480 ml  Output 1000 ml  Net -520 ml   Last 3 Weights 11/12/2018 11/11/2018 11/10/2018  Weight (lbs) 130 lb 130 lb 11.7 oz 138 lb  Weight (kg) 58.968 kg 59.3 kg 62.596 kg      Telemetry    SR/ST with runs of atrial tachycardia since ~5AM and previously atrial flutter with variable block - Personally Reviewed  ECG    No new tracings available per EMR  - Personally Reviewed  Physical Exam   GEN: Elderly female, no acute  distress  Neck: No JVD. JVP ~8-9cm Cardiac: IRIR, no murmurs, rubs, or gallops.  Respiratory: Bibasilar reduced breath sounds, coarse breath sounds, expiratory wheeze GI: Soft, nontender, non-distended  MS: No edema; No deformity. Neuro:  Nonfocal  Psych: Normal affect   Labs    High Sensitivity Troponin:   Recent Labs  Lab 11/11/18 0010 11/11/18 0317  TROPONINIHS 5 5      Cardiac EnzymesNo results for input(s): TROPONINI in the last 168 hours. No results for input(s): TROPIPOC in the last 168 hours.   Chemistry Recent Labs  Lab 11/05/18 1136 11/10/18 1746 11/11/18 0317  NA 136 133* 136  K 4.5 4.5 4.2  CL 94* 95* 98  CO2 25 27 31   GLUCOSE 143* 142* 136*  BUN 15 19 16   CREATININE 0.50* 0.55 0.56  CALCIUM 9.1 8.9 8.5*  PROT  --  7.9  --   ALBUMIN  --  3.5  --   AST  --  47*  --   ALT  --  56*  --   ALKPHOS  --  69  --   BILITOT  --  0.7  --   GFRNONAA 82 >60 >  60  GFRAA 94 >60 >60  ANIONGAP  --  11 7     Hematology Recent Labs  Lab 11/05/18 1136 11/10/18 1746 11/11/18 0317  WBC 8.8 9.2 9.3  RBC 3.79 3.71* 3.52*  HGB 12.2 11.9* 11.4*  HCT 37.6 37.0 35.5*  MCV 99* 99.7 100.9*  MCH 32.2 32.1 32.4  MCHC 32.4 32.2 32.1  RDW 12.4 14.0 13.9  PLT 228 227 195    BNP Recent Labs  Lab 11/10/18 1746 11/11/18 0010  BNP 312.0* 283.0*     DDimer No results for input(s): DDIMER in the last 168 hours.   Radiology    Dg Chest 1 View  Result Date: 11/12/2018 CLINICAL DATA:  Follow-up pleural effusion EXAM: CHEST  1 VIEW COMPARISON:  None. FINDINGS: Heart size is mildly enlarged. Chronic aortic atherosclerosis. Pulmonary venous hypertension with interstitial and alveolar edema persists. Persistent effusions, right larger than left, with volume loss at lung bases. IMPRESSION: Persisting congestive heart failure pattern with edema, effusions and volume loss. Electronically Signed   By: Nelson Chimes M.D.   On: 11/12/2018 08:48   Dg Chest Portable 1 View  Result  Date: 11/10/2018 CLINICAL DATA:  Shortness of breath EXAM: PORTABLE CHEST 1 VIEW COMPARISON:  10/10/2018 FINDINGS: Cardiac shadow remains enlarged. Aortic calcifications are seen. Bilateral pleural effusions right greater than left are now seen new from the prior exam. Underlying right basilar infiltrate is likely present. Mild vascular congestion is noted without interstitial edema. No bony abnormality is noted. IMPRESSION: New pleural effusions and right basilar infiltrate. Increase in vascular congestion. Electronically Signed   By: Inez Catalina M.D.   On: 11/10/2018 18:15    Cardiac Studies    Echo 11/11/2018 1. The left ventricle has normal systolic function, with an ejection fraction of 55-60%. The cavity size was normal. Left ventricular diastolic Doppler parameters are consistent with restrictive filling. Elevated mean left atrial pressure No evidence of  left ventricular regional wall motion abnormalities.  2. The right ventricle has mildly reduced systolic function. The cavity was normal. There is no increase in right ventricular wall thickness. Right ventricular systolic pressure is moderately elevated with an estimated pressure of 58.3 mmHg.  3. Left atrial size was moderately dilated.  4. Right atrial size was mildly dilated.  5. The mitral valve is degenerative. Mild thickening of the mitral valve leaflet. There is mild mitral annular calcification present. Mitral valve regurgitation is moderate by color flow Doppler.  6. Tricuspid valve regurgitation is moderate.  7. The aortic valve is tricuspid. Moderate thickening of the aortic valve. Sclerosis without any evidence of stenosis of the aortic valve. Aortic valve regurgitation is trivial by color flow Doppler.  8. The aorta is normal in size and structure.  9. The inferior vena cava was normal in size with <50% respiratory variability.  Echo  10/22/2018 See scan under CV procedures tab: She underwent an echocardiogram that showed  normal LV systolic function with moderately dilated left atrium, mild aortic stenosis, mild to moderate tricuspid regurgitation, and mild pulmonary hypertension.    Patient Profile     85 y.o. female with a hx of paroxysmal atrial fibrillation with rapid ventricular response s/p DCCV 7/22, possible tachybradycardia syndrome, hyperlipidemia, essential hypertension, and who is being seen today for the evaluation of atrial fibrillation with rapid ventricular response s/p DCCV 7/22.  Assessment & Plan    Atrial Fibrillation with RVR - S/p DCCV and maintaining SR with suboptimal rates in the 90s this AM and suboptimal  BP with SBP up into the 150s. Patient has not yet received morning Cardizem 180mg  daily and suspect improvement after this medication. Metoprolol restarted due to runs of atrial tachycardia on telemetry this AM. --Echo updated as above and showing normal LVEF with moderate mitral and tricuspid regurgitation. RVSP 58.68mmHg. --TSH 4.231 on 7/22. - CHA2DS2VASc score of at least  (HTN, female, vascular/DVT, agex2) 5 with recommendation to continue long term anticoagulation with Eliquis 2.5mg  BID as meets criteria for reduced dosing.  - Continue restarted Cardizem and metoprolol. Could consider starting on amiodarone to ensure patient remains in SR following her DCCV yesterday 7/22 and given her runs of atrial tachycardia. Continue to monitor vitals closely and with ambulation given runs of atrial tachycardia.  Respiratory distress  Bilateral Pleural Effusions / Pulmonary HTN (EF 55-60%) / COPD - SOB/DOE still reported on [place] oxygen with oxygen saturations in low 90s. --BNP 7/22 283.0. Echo as above with normal EF, RVSP 58.104mmHg.  --Repeat CXR today with persistent interstitial and alveolar edema, effusions with R>L, volume loss at bases.  --Wt 138lbs  130.73lbs  130lbs. -267.4cc yesterday and -741 for admission. - Increased IV lasix to 40mg  BID. Continue diuresis. Monitor I&O's / urinary  output / daily standing weights.   --Ordered updated BMET. Scr 0.56 and stable 7/22. Replace electrolytes as indicated with potassium goal 4.0 and Mg 2.0.   --Continue [place] oxygen, breathing treatments. -- As previously noted, if patient continues to have recurrent shortness of breath or continued hypoxia, therapeutic thoracentesis may need to be considered.  HLD - LDL 42 with HDL low at 30  HTN --BP suboptimally controlled - restarted BB/Cardizem.  For questions or updates, please contact [place] Please consult [email or web address] for contact info under        Signed, Arvil Chaco, PA-C  11/12/2018, 8:59 AM

## 2018-11-12 NOTE — TOC Initial Note (Signed)
Transition of Care [facility]) - Initial/Assessment Note    Patient Details  Name: Ashley Weiss MRN: [id number] Date of Birth: [birth year]  Transition of Care [facility]) CM/SW Contact:    Katrina Stack, RN Phone Number: 11/12/2018, 6:24 PM  Clinical Narrative:                Presented for elective cardioversion. Experienced shortness of breath due to volume overload requiring IV diuresis.  Has chronic nocturnal; oxygen. Will need home oxygen assessment prior to discharge. Lives alone and prior to covid was able to drive.  Her daughter has moved in with her during the pandemic. Patient has a walker and a cane.  Agreeable to home health. No agency preference.  Referral for RN and PT called to and accepted by  [facility]   Expected Discharge Plan: [place] Barriers to Discharge: No Barriers Identified   Patient Goals and CMS Choice Patient states their goals for this hospitalization and ongoing recovery are:: Go home CMS Medicare.gov Compare Post Acute Care list provided to:: Patient Choice offered to / list presented to : Patient  Expected Discharge Plan and Services Expected Discharge Plan: [place]   Discharge Planning Services: CM Consult Post Acute Care Choice: [place] arrangements for the past 2 months: [place] Arranged: RN, PT [place] Agency: [place] Date [facility] Agency Contacted: 11/12/18 Time HH Agency Contacted: [phone] Representative spoke with at [place]: Georgina Snell  Prior Living Arrangements/Services Living arrangements for the past 2 months: [place] with:: Self, Adult Children Patient language and need for interpreter reviewed:: Yes Do you feel safe going back to the place where you live?: Yes      Need for Family Participation in Patient Care: Yes (Comment)   Current home services: DME(Nocturnal 02 with [place] Patient) Criminal Activity/Legal Involvement  Pertinent to Current Situation/Hospitalization: No - Comment as needed  Activities of Daily Living Home Assistive Devices/Equipment: Cane (specify quad or straight), Hearing aid, Dentures (specify type), Eyeglasses, Oxygen ADL Screening (condition at time of admission) Patient's cognitive ability adequate to safely complete daily activities?: Yes Is the patient deaf or have difficulty hearing?: Yes Does the patient have difficulty seeing, even when wearing glasses/contacts?: No Does the patient have difficulty concentrating, remembering, or making decisions?: No Patient able to express need for assistance with ADLs?: Yes Does the patient have difficulty dressing or bathing?: No Independently performs ADLs?: Yes (appropriate for developmental age) Does the patient have difficulty walking or climbing stairs?: Yes Weakness of Legs: Both Weakness of Arms/Hands: None  Permission Sought/Granted Permission sought to share information with : Chartered certified accountant granted to share information with : Yes, Verbal Permission Granted              Emotional Assessment Appearance:: Appears stated age Attitude/Demeanor/Rapport: Engaged Affect (typically observed): Calm Orientation: : Oriented to Self, Oriented to Place, Oriented to  Time, Oriented to Situation Alcohol / Substance Use: Not Applicable Psych Involvement: No (comment)  Admission diagnosis:  Atrial fibrillation with rapid ventricular response (HCC) [I48.91] Acute on chronic respiratory failure with hypoxia ([place]) [J96.21] Patient Active Problem List   Diagnosis Date Noted  . Acute heart failure with preserved ejection fraction (HFpEF) ([place])   . Shortness of breath 10/21/2018  . Atrial fibrillation with  rapid ventricular response ([place]) 10/10/2018  . Atrial fibrillation with RVR ([place]) 10/10/2018  . Nocturnal hypoxemia due to asthma 10/29/2017  . Acute pain of right wrist 09/30/2017  . Hyperlipidemia 07/23/2017   . Osteoporosis, post-menopausal 07/03/2017  . Chronic obstructive pulmonary disease, unspecified ([place]) 05/28/2017  . Pain in left knee 05/28/2017  . Essential (primary) hypertension 05/28/2017  . Cardiac arrhythmia 05/28/2017  . Hypothyroidism 05/28/2017  . Injury of right hip 06/15/2015  . Mild intermittent asthma without complication 20/81/3887   PCP:  Lavera Guise, MD Pharmacy:   [id number] - [place], [place] [address] [place] [address] Phone: [phone] Fax: [phone]  [id number] - [place], [place] [address] [place] [address] Phone: [phone] Fax: [phone]     Social Determinants of Health (SDOH) Interventions    Readmission Risk Interventions No flowsheet data found.

## 2018-11-12 NOTE — Progress Notes (Signed)
[place] [facility] [place] NAME: Ashley Weiss    MR#:  [id number]  DATE OF BIRTH:  1919/12/26  SUBJECTIVE:  CHIEF COMPLAINT:   Chief Complaint  Patient presents with  . Shortness of Breath   Patient reports that she has just returned from the restroom and says she is short of breath and her heart is beating fast. She says this has been happening. She is normally on 2L [place] at night, but is requiring it now with exertion.  REVIEW OF SYSTEMS:  ROS  DRUG ALLERGIES:   Allergies  Allergen Reactions  . Hydralazine Itching and Swelling  . Biaxin [Clarithromycin] Nausea And Vomiting  . Ciprofloxacin Other (See Comments)    Hand cramping   . Meloxicam Other (See Comments)    Unknown  . Minocycline Other (See Comments)    Unknown   VITALS:  Blood pressure 140/77, pulse 93, temperature 98.4 F (36.9 C), temperature source Oral, resp. rate 16, height 5\' 2"  (1.575 m), weight 59 kg, SpO2 90 %. PHYSICAL EXAMINATION:  Physical Exam Vitals signs reviewed.  Constitutional:      Appearance: She is well-developed.  HENT:     Head: Normocephalic and atraumatic.  Pulmonary:     Effort: Tachypnea present.     Comments: On 2L [place] Musculoskeletal:     Right lower leg: No edema.     Left lower leg: No edema.  Skin:    General: Skin is warm.  Neurological:     General: No focal deficit present.     Mental Status: She is alert.  Psychiatric:        Mood and Affect: Mood normal.        Behavior: Behavior normal.    LABORATORY PANEL:  Female CBC Recent Labs  Lab 11/11/18 0317  WBC 9.3  HGB 11.4*  HCT 35.5*  PLT 195   ------------------------------------------------------------------------------------------------------------------ Chemistries  Recent Labs  Lab 11/10/18 1746 11/11/18 0317  NA 133* 136  K 4.5 4.2  CL 95* 98  CO2 27 31  GLUCOSE 142* 136*  BUN 19 16  CREATININE 0.55 0.56  CALCIUM 8.9 8.5*  MG 2.1  --   AST 47*  --    ALT 56*  --   ALKPHOS 69  --   BILITOT 0.7  --    RADIOLOGY:  Dg Chest 1 View  Result Date: 11/12/2018 CLINICAL DATA:  Follow-up pleural effusion EXAM: CHEST  1 VIEW COMPARISON:  None. FINDINGS: Heart size is mildly enlarged. Chronic aortic atherosclerosis. Pulmonary venous hypertension with interstitial and alveolar edema persists. Persistent effusions, right larger than left, with volume loss at lung bases. IMPRESSION: Persisting congestive heart failure pattern with edema, effusions and volume loss. Electronically Signed   By: Nelson Chimes M.D.   On: 11/12/2018 08:48   ASSESSMENT AND PLAN:  85 yo female with known history of atrial fibrillation, CHF, COPD, hypertension, hyperlipidemia who presented with A fib with RVR.   1.Atrial fibrillation s/p cardioversion -s/p diltiazem gtt, started on oral diltiazem after cardioversion restarted metoprolol  -Cardiology following, appreciate recs -O2 per nasal cannula prn, wean to RA as able -Continue Eliquis 2.5 mg BID- CHA2DSVASc score of 5.   2. Acute on chronic HFpEF with BL pleural effusions: -increase IV Lasix to 40mg  BID given poor urine output -O2 at 2 L per nasal cannula prn, wean to RA as able - restarted metoprolol  -Echo with EF 55-60%. RVSP elevated at 58.3 mmHg. Moderate MV  and TV regurgitation. -may need therapeutic thoracocentesis for effusions if no improvment  3. COPD -DuoNeb every 6 hours -O2 per nasal cannula  All the records are reviewed and case discussed with Care Management/Social Worker. Management plans discussed with the patient, family and they are in agreement.  CODE STATUS: DNR  POSSIBLE D/C IN 1-2 DAYS, DEPENDING ON CLINICAL CONDITION.  [place] Eulogia Dismore, DO PGY-3, [facility]  11/12/2018 at 12:05 PM  Between 7am to 6pm - Pager - [phone]  After 6pm go to [email or web address] - password [email or web address] [place] [facility]  Office  [phone]  CC: Primary care  physician; Lavera Guise, MD

## 2018-11-13 DIAGNOSIS — J90 Pleural effusion, not elsewhere classified: Secondary | ICD-10-CM

## 2018-11-13 DIAGNOSIS — I4891 Unspecified atrial fibrillation: Secondary | ICD-10-CM

## 2018-11-13 DIAGNOSIS — J9621 Acute and chronic respiratory failure with hypoxia: Secondary | ICD-10-CM

## 2018-11-13 LAB — FOLATE: Folate: 34 ng/mL (ref >5.9)

## 2018-11-13 LAB — BASIC METABOLIC PANEL
Anion gap: 11 (ref 5–15)
BUN: 10 mg/dL (ref 8–23)
CO2: 33 mmol/L — ABNORMAL HIGH (ref 22–32)
Calcium: 8.6 mg/dL — ABNORMAL LOW (ref 8.9–10.3)
Chloride: 91 mmol/L — ABNORMAL LOW (ref 98–111)
Creatinine, Ser: 0.4 mg/dL — ABNORMAL LOW (ref 0.44–1.00)
GFR calc Af Amer: >60 mL/min (ref >60)
GFR calc non Af Amer: >60 mL/min (ref >60)
Glucose, Bld: 150 mg/dL — ABNORMAL HIGH (ref 70–99)
Potassium: 3.5 mmol/L (ref 3.5–5.1)
Sodium: 135 mmol/L (ref 135–145)

## 2018-11-13 LAB — CBC
HCT: 40.4 % (ref 36.0–46.0)
Hemoglobin: 13.3 g/dL (ref 12.0–15.0)
MCH: 32.5 pg (ref 26.0–34.0)
MCHC: 32.9 g/dL (ref 30.0–36.0)
MCV: 98.8 fL (ref 80.0–100.0)
Platelets: 219 10*3/uL (ref 150–400)
RBC: 4.09 MIL/uL (ref 3.87–5.11)
RDW: 13.6 % (ref 11.5–15.5)
WBC: 10.9 10*3/uL — ABNORMAL HIGH (ref 4.0–10.5)
nRBC: 0 % (ref 0.0–0.2)

## 2018-11-13 LAB — VITAMIN B12: Vitamin B-12: 1164 pg/mL — ABNORMAL HIGH (ref 180–914)

## 2018-11-13 MED ORDER — SODIUM CHLORIDE 0.9% FLUSH: 3.0000 mL | Freq: Two times a day (BID) | INTRAVENOUS | Status: DC

## 2018-11-13 MED ORDER — AMIODARONE LOAD VIA INFUSION
150.0000 mg | Freq: Once | INTRAVENOUS | Status: AC
  Administered 2018-11-13: 150 mg via INTRAVENOUS
  Filled 2018-11-13: qty 83.34

## 2018-11-13 MED ORDER — POTASSIUM CHLORIDE CRYS ER 20 MEQ PO TBCR
20.0000 meq | EXTENDED_RELEASE_TABLET | Freq: Two times a day (BID) | ORAL | Status: AC
  Administered 2018-11-13 (×2): 20 meq via ORAL
  Filled 2018-11-13 (×2): qty 1

## 2018-11-13 MED ORDER — DILTIAZEM HCL 25 MG/5ML IV SOLN
5.0000 mg | Freq: Once | INTRAVENOUS | Status: AC
  Administered 2018-11-13: 5 mg via INTRAVENOUS
  Filled 2018-11-13: qty 5

## 2018-11-13 MED ORDER — SODIUM CHLORIDE 0.9% FLUSH: 3.0000 mL | INTRAVENOUS | Status: DC | PRN

## 2018-11-13 MED ORDER — AMIODARONE HCL IN DEXTROSE 360-4.14 MG/200ML-% IV SOLN
30.0000 mg/h | INTRAVENOUS | Status: DC
  Administered 2018-11-13 – 2018-11-14 (×3): 30 mg/h via INTRAVENOUS
  Administered 2018-11-14: 60 mg/h via INTRAVENOUS
  Administered 2018-11-15: 30 mg/h via INTRAVENOUS
  Filled 2018-11-13 (×5): qty 200

## 2018-11-13 MED ORDER — SODIUM CHLORIDE 0.9 % IV SOLN: 250.0000 mL | INTRAVENOUS | Status: DC

## 2018-11-13 MED ORDER — AMIODARONE HCL IN DEXTROSE 360-4.14 MG/200ML-% IV SOLN
60.0000 mg/h | INTRAVENOUS | Status: AC
  Administered 2018-11-13: 60 mg/h via INTRAVENOUS
  Filled 2018-11-13 (×2): qty 200

## 2018-11-13 NOTE — Progress Notes (Signed)
Occupational Therapy Treatment Patient Details Name: Ashley Weiss MRN: [id number] DOB: 1919-07-06 Today's Date: 11/13/2018    History of present illness Pt is 85 yo female that presented to the emergency room for evaluation of "fast heart rate" with shortness of breath and cough over the last 2 to 3 days.  Workup revealed atrial fibrilation/a flutter with RVR, S/p cardioversion 11/11/2018. PMH of afib, CHF, COPD with 2L at baseline, HTN, HLD, DVT, asthma, skin cancer.   OT comments  Pt seen for OT treatment on this date. Upon arrival to room pt awake/alert semi-supine in bed, and A&O x 4. Pt reporting 0/10 pain and agreeable to OT tx on this date. Pt observed to have SpO2 of 94 while on 2L and her HR fluctuated from the 110's to upper 120's supine in bed. Mobility was deferred d/t pt HR at time of session. Pt completed bed-level grooming with set-up assist from this therapist. OT also provided reinforcement on PLB strategies for ECS. Pt return demonstrated PLB with min VC's for technique. Of note: since OT evaluation, pt has had a witnessed fall while in her hospital room, pt states she slipped while using her RW to get to the bathroom. Pt would benefit from further reinforcement in falls prevention strategies for home and hospital during this admission. Pt making progress toward goals. Pt continues to benefit from skilled OT services to maximize return to PLOF and minimize risk of future falls, injury, caregiver burden, and readmission. Will continue to follow POC. Discharge recommendation remains appropriate at this time, but will continue to assess at future sessions.    Follow Up Recommendations  Home health OT    Equipment Recommendations  None recommended by OT    Recommendations for Other Services      Precautions / Restrictions Precautions Precautions: Fall Restrictions Weight Bearing Restrictions: No       Mobility Bed Mobility               General bed mobility  comments: Mobility deferred for pt safety on this date.  Transfers                      Balance                                           ADL either performed or assessed with clinical judgement   ADL       Grooming: Set up;Oral care;Wash/dry face;Bed level Grooming Details (indicate cue type and reason): Pt completed grooming at bed level on this date as her HR when supine in bed was in the upper 120's. OT provided set-up assist and pt completed oral care and face washing without further assistance.                               General ADL Comments: Pt continues to require at least min guard to min assist for heavy ADLs, since OT eval has sustained a whitness fall using her RW to ambulate to her room bathroom. Pt continues to benefit from ADL training and ECS to support safety and functional independence.     Vision Wears Glasses: Reading only Patient Visual Report: No change from baseline     Perception     Praxis      Cognition Arousal/Alertness: Awake/alert Behavior During Therapy:  WFL for tasks assessed/performed Overall Cognitive Status: Within Functional Limits for tasks assessed                                 General Comments: Pt tearful at times. Expressed concerns over her condition. OT utilized therapeutic use of self and active listening to provide emotional support throughout session.        Exercises Other Exercises Other Exercises: OT provided reinforcement on prior education in ECS as well as provided set-up assist for bed level grooming on this date.   Shoulder Instructions       General Comments Pt with HR in variable ranges t/o session. Pt remained supine in bed and HR was observed to fluctuate between the 110's to upper 120's. Pt BP remained stable at 103/71 at first reading and 99/71 after ~10 minutes. Deferred mobility 2/2 pt HR at rest.    Pertinent Vitals/ Pain       Pain Assessment:  No/denies pain  Home Living                                          Prior Functioning/Environment              Frequency  Min 1X/week        Progress Toward Goals  OT Goals(current goals can now be found in the care plan section)  Progress towards OT goals: Progressing toward goals  Acute Rehab OT Goals Patient Stated Goal: to go home OT Goal Formulation: With patient Time For Goal Achievement: 11/25/18 Potential to Achieve Goals: Good  Plan Discharge plan remains appropriate    Co-evaluation                 AM-PAC OT "6 Clicks" Daily Activity     Outcome Measure   Help from another person eating meals?: A Little Help from another person taking care of personal grooming?: A Little Help from another person toileting, which includes using toliet, bedpan, or urinal?: A Little Help from another person bathing (including washing, rinsing, drying)?: A Little Help from another person to put on and taking off regular upper body clothing?: A Little Help from another person to put on and taking off regular lower body clothing?: A Little 6 Click Score: 18    End of Session    OT Visit Diagnosis: Other abnormalities of gait and mobility (R26.89);History of falling (Z91.81);Muscle weakness (generalized) (M62.81)   Activity Tolerance Patient tolerated treatment well;Treatment limited secondary to medical complications (Comment)(Pt increased HR limited mobility attempts at this time.)   Patient Left in bed;with call bell/phone within reach;with bed alarm set   Nurse Communication Other (comment)(Pt HR)        Time: 7939-0300 OT Time Calculation (min): 19 min  Charges: OT General Charges $OT Visit: 1 Visit OT Treatments $Self Care/Home Management : 8-22 mins  Shara Blazing, M.S., OTR/L Ascom: [phone] 11/13/18, 12:21 PM

## 2018-11-13 NOTE — Progress Notes (Signed)
[facility] [place] NAME: Ashley Weiss    MR#:  [id number]  DATE OF BIRTH:  [birth year]  SUBJECTIVE:  CHIEF COMPLAINT:   Chief Complaint  Patient presents with  . Shortness of Breath   Patient upset this morning that she has to remain in the hospital.  She went back into A. fib yesterday.  She was also quite tachycardic and had a fall overnight trying to go to the restroom.  Patient reports this morning that she is not having any pain from her fall.  She states that otherwise she is feeling ok. REVIEW OF SYSTEMS:  Review of Systems  Respiratory: Negative for shortness of breath.   Cardiovascular: Positive for palpitations. Negative for chest pain.  Gastrointestinal: Negative for abdominal pain.    DRUG ALLERGIES:   Allergies  Allergen Reactions  . Hydralazine Itching and Swelling  . Biaxin [Clarithromycin] Nausea And Vomiting  . Ciprofloxacin Other (See Comments)    Hand cramping   . Meloxicam Other (See Comments)    Unknown  . Minocycline Other (See Comments)    Unknown   VITALS:  Blood pressure (!) 115/94, pulse 94, temperature (!) 97.3 F (36.3 C), temperature source Oral, resp. rate 19, height 5\' 2"  (1.575 m), weight 59 kg, SpO2 97 %. PHYSICAL EXAMINATION:  Physical Exam Vitals signs reviewed.  Constitutional:      General: She is not in acute distress.    Appearance: She is well-developed.  HENT:     Head: Normocephalic and atraumatic.  Pulmonary:     Effort: Pulmonary effort is normal. No tachypnea.     Comments: On 2L [place] Abdominal:     Tenderness: There is no abdominal tenderness.  Neurological:     General: No focal deficit present.     Mental Status: She is alert.  Psychiatric:        Behavior: Behavior normal.    LABORATORY PANEL:  Female CBC Recent Labs  Lab 11/13/18 0602  WBC 10.9*  HGB 13.3  HCT 40.4  PLT 219    ------------------------------------------------------------------------------------------------------------------ Chemistries  Recent Labs  Lab 11/10/18 1746  11/13/18 0602  NA 133*   < > 135  K 4.5   < > 3.5  CL 95*   < > 91*  CO2 27   < > 33*  GLUCOSE 142*   < > 150*  BUN 19   < > 10  CREATININE 0.55   < > 0.40*  CALCIUM 8.9   < > 8.6*  MG 2.1  --   --   AST 47*  --   --   ALT 56*  --   --   ALKPHOS 69  --   --   BILITOT 0.7  --   --    < > = values in this interval not displayed.   RADIOLOGY:  No results found. ASSESSMENT AND PLAN:  85 yo female with known history of atrial fibrillation, CHF, COPD, hypertension, hyperlipidemia who presented with A fib with RVR.   1.Atrial fibrillation with RVR -continue oral diltiazem (s/p dilt gtt) and metoprolol as BP will tolerate -Cardiology following, appreciate recs  -s/p DCCV and converting back to atrial fibrillation with RVR -Patient started on amiodarone oral dosing with no improvement in rate, per cardiology will start amiodarone gtt and continue over the weekend for a possible cardioversion on 7/27 -O2 per [place] prn, wean to RA as able; patient normally uses qhs  -Continue Eliquis 2.5  mg BID- CHA2DSVASc score of 5.   2. Acute on chronic HFpEF with BL pleural effusions: -Continue IV Lasix to 40mg  BID; improved UOP overnight with net output since admission at 1.2L -O2 at 2 L per nasal cannula prn, wean to RA as able -Echo with EF 55-60%.RVSP elevated at 58.3 mmHg. Moderate MV and TV regurgitation. -may need therapeutic thoracocentesis for effusions if no improvement-we will try to avoid, repeat CXR in a.m.  3. COPD -DuoNeb every 6 hours -O2 per nasal cannula prn  4.  Fall overnight: -Patient is high fall risk. Precautions in place. Patient denies any pain after fall.  No indication for imaging at this time. -Continue to monitor -PT and OT recommending home health; order placed for DME rolling walker  All the  records are reviewed and case discussed with Care Management/Social Worker. Management plans discussed with the patient, family and they are in agreement.  CODE STATUS: DNR  POSSIBLE D/C IN 3 DAYS, DEPENDING ON CLINICAL CONDITION.  Martinique Kemarion Abbey, DO PGY-3, [facility]  11/13/2018 at 11:13 AM  Between 7am to 6pm - Pager - [phone]  After 6pm go to [email or web address] - password [email or web address] [facility]  Office  [phone]  CC: Primary care physician; Lavera Guise, MD

## 2018-11-13 NOTE — Progress Notes (Addendum)
PT Cancellation Note  Patient Details Name: Ashley Weiss MRN: [id number] DOB: 1919-09-01   Cancelled Treatment:    Reason Eval/Treat Not Completed: Other (comment)(PT spoke with OT and RN, pt with elevated HR at rest (visualized by PT on telemetry). Per RN plan is for pt to undergo second cardioversion 7/27. PT will follow up as able.)   Lieutenant Diego PT, DPT 11:59 AM,11/13/18 [phone]

## 2018-11-13 NOTE — Progress Notes (Signed)
Progress Note  Patient Name: Ashley Weiss Date of Encounter: 11/13/2018  Primary Cardiologist: Kathlyn Sacramento, MD   Subjective   No chest pain or significant shortness of breath Breakfast this morning, comfortable no distress Telemetry reviewed showing atrial fibrillation with RVR heart rate 130 even up to 150 at rest  Inpatient Medications    Scheduled Meds:  amiodarone  150 mg Intravenous Once   apixaban  2.5 mg Oral BID   budesonide  2 mL Inhalation BID   diltiazem  180 mg Oral Daily   furosemide  40 mg Intravenous BID   ipratropium-albuterol  3 mL Nebulization TID   levothyroxine  25 mcg Oral QAC breakfast   mouth rinse  15 mL Mouth Rinse BID   metoprolol tartrate  100 mg Oral BID   montelukast  10 mg Oral Daily   rosuvastatin  5 mg Oral q1800   sertraline  100 mg Oral Daily   sodium chloride flush  3 mL Intravenous Q12H   sodium chloride flush  3 mL Intravenous Q12H   vitamin C  500 mg Oral Daily   Continuous Infusions:  sodium chloride     amiodarone     Followed by   amiodarone     PRN Meds: sodium chloride, acetaminophen, albuterol, ondansetron (ZOFRAN) IV, sodium chloride flush, sodium chloride flush   Vital Signs    Vitals:   11/12/18 1958 11/13/18 0433 11/13/18 0720 11/13/18 0748  BP:  102/88 (!) 115/94   Pulse:  (!) 135 94   Resp:  18 19   Temp:  98 F (36.7 C) (!) 97.3 F (36.3 C)   TempSrc:  Oral Oral   SpO2: 94% 94% 97% 97%  Weight:      Height:        Intake/Output Summary (Last 24 hours) at 11/13/2018 0914 Last data filed at 11/12/2018 1921 Gross per 24 hour  Intake 246 ml  Output 1400 ml  Net -1154 ml   Last 3 Weights 11/12/2018 11/11/2018 11/10/2018  Weight (lbs) 130 lb 130 lb 11.7 oz 138 lb  Weight (kg) 58.968 kg 59.3 kg 62.596 kg      Telemetry    SR/ST with runs of atrial tachycardia since ~5AM and previously atrial flutter with variable block - Personally Reviewed  ECG    No new tracings available per  EMR  - Personally Reviewed  Physical Exam   GEN: Elderly female, no acute distress  Neck: No JVD. JVP ~8-9cm Cardiac: Irregularly irregular, rapid no murmurs, rubs, or gallops.  Respiratory:  Clear, scattered Rales GI: Soft, nontender, non-distended  MS: No edema; No deformity. Neuro:  Nonfocal  Psych: Normal affect   Labs    High Sensitivity Troponin:   Recent Labs  Lab 11/11/18 0010 11/11/18 0317  TROPONINIHS 5 5      Cardiac EnzymesNo results for input(s): TROPONINI in the last 168 hours. No results for input(s): TROPIPOC in the last 168 hours.   Chemistry Recent Labs  Lab 11/10/18 1746 11/11/18 0317 11/12/18 1240 11/13/18 0602  NA 133* 136 138 135  K 4.5 4.2 3.6 3.5  CL 95* 98 94* 91*  CO2 27 31 35* 33*  GLUCOSE 142* 136* 116* 150*  BUN 19 16 12 10   CREATININE 0.55 0.56 0.47 0.40*  CALCIUM 8.9 8.5* 8.9 8.6*  PROT 7.9  --   --   --   ALBUMIN 3.5  --   --   --   AST 47*  --   --   --  ALT 56*  --   --   --   ALKPHOS 69  --   --   --   BILITOT 0.7  --   --   --   GFRNONAA >60 >60 >60 >60  GFRAA >60 >60 >60 >60  ANIONGAP 11 7 9 11      Hematology Recent Labs  Lab 11/10/18 1746 11/11/18 0317 11/13/18 0602  WBC 9.2 9.3 10.9*  RBC 3.71* 3.52* 4.09  HGB 11.9* 11.4* 13.3  HCT 37.0 35.5* 40.4  MCV 99.7 100.9* 98.8  MCH 32.1 32.4 32.5  MCHC 32.2 32.1 32.9  RDW 14.0 13.9 13.6  PLT 227 195 219    BNP Recent Labs  Lab 11/10/18 1746 11/11/18 0010  BNP 312.0* 283.0*     DDimer No results for input(s): DDIMER in the last 168 hours.   Radiology    Dg Chest 1 View  Result Date: 11/12/2018 CLINICAL DATA:  Follow-up pleural effusion EXAM: CHEST  1 VIEW COMPARISON:  None. FINDINGS: Heart size is mildly enlarged. Chronic aortic atherosclerosis. Pulmonary venous hypertension with interstitial and alveolar edema persists. Persistent effusions, right larger than left, with volume loss at lung bases. IMPRESSION: Persisting congestive heart failure pattern  with edema, effusions and volume loss. Electronically Signed   By: Nelson Chimes M.D.   On: 11/12/2018 08:48    Cardiac Studies    Echo 11/11/2018 1. The left ventricle has normal systolic function, with an ejection fraction of 55-60%. The cavity size was normal. Left ventricular diastolic Doppler parameters are consistent with restrictive filling. Elevated mean left atrial pressure No evidence of  left ventricular regional wall motion abnormalities.  2. The right ventricle has mildly reduced systolic function. The cavity was normal. There is no increase in right ventricular wall thickness. Right ventricular systolic pressure is moderately elevated with an estimated pressure of 58.3 mmHg.  3. Left atrial size was moderately dilated.  4. Right atrial size was mildly dilated.  5. The mitral valve is degenerative. Mild thickening of the mitral valve leaflet. There is mild mitral annular calcification present. Mitral valve regurgitation is moderate by color flow Doppler.  6. Tricuspid valve regurgitation is moderate.  7. The aortic valve is tricuspid. Moderate thickening of the aortic valve. Sclerosis without any evidence of stenosis of the aortic valve. Aortic valve regurgitation is trivial by color flow Doppler.  8. The aorta is normal in size and structure.  9. The inferior vena cava was normal in size with <50% respiratory variability.  Echo  10/22/2018 See scan under CV procedures tab: She underwent an echocardiogram that showed normal LV systolic function with moderately dilated left atrium, mild aortic stenosis, mild to moderate tricuspid regurgitation, and mild pulmonary hypertension.    Patient Profile     85 y.o. female with a hx of paroxysmal atrial fibrillation with rapid ventricular response s/p DCCV 7/22, possible tachybradycardia syndrome, hyperlipidemia, essential hypertension, and who is being seen today for the evaluation of atrial fibrillation with rapid ventricular response s/p  DCCV 7/22.  Assessment & Plan    Atrial Fibrillation with RVR - S/p DCCV and converting back to atrial tachycardia followed by atrial flutter followed by atrial fibrillation with RVR -Started on amiodarone oral dosing third dose this morning with no improvement in rate --- Continue metoprolol and diltiazem as blood pressure tolerates --- We will change amiodarone tablet to amio loading dose with infusion given poor control rate up to 130 even 150 bpm this morning on exam - CHA2DS2VASc score  of 5  - Eliquis 2.5mg  BID  Suspect she will need amiodarone loading over the weekend with cardioversion on Monday morning. --We have tentatively placed orders for cardioversion on Monday morning July 27.  Anesthesia and specials recovery has been contacted to coordinate. Details discussed with daughter in detail   Respiratory distress  Bilateral Pleural Effusions / Pulmonary HTN (EF 55-60%) / COPD -In the setting of atrial fibrillation with RVR Echo as above with normal EF, RVSP 58.56mmHg.  CXR  with persistent interstitial and alveolar edema, effusions with R>L,  For now we will continue IV lasix  40mg  BID.  --We have tentatively placed orders for cardioversion on Monday morning July 27.  Anesthesia and specials recovery has been contacted to coordinate. Details discussed with daughter in detail  Total encounter time more than 35 minutes  Greater than 50% was spent in counseling and coordination of care with the patient  For questions or updates, please contact [place] Please consult [email or web address] for contact info under        Signed, Ida Rogue, MD  11/13/2018, 9:14 AM

## 2018-11-13 NOTE — Care Management Important Message (Signed)
Important Message  Patient Details  Name: Ashley Weiss MRN: [id number] Date of Birth: 12/30/1919   Medicare Important Message Given:  Yes     Dannette Barbara 11/13/2018, 12:38 PM

## 2018-11-14 ENCOUNTER — Other Ambulatory Visit: Payer: Self-pay | Admitting: Adult Health

## 2018-11-14 ENCOUNTER — Inpatient Hospital Stay: Payer: Medicare Other

## 2018-11-14 LAB — BASIC METABOLIC PANEL
Anion gap: 7 (ref 5–15)
BUN: 21 mg/dL (ref 8–23)
CO2: 34 mmol/L — ABNORMAL HIGH (ref 22–32)
Calcium: 8.4 mg/dL — ABNORMAL LOW (ref 8.9–10.3)
Chloride: 92 mmol/L — ABNORMAL LOW (ref 98–111)
Creatinine, Ser: 0.47 mg/dL (ref 0.44–1.00)
GFR calc Af Amer: >60 mL/min (ref >60)
GFR calc non Af Amer: >60 mL/min (ref >60)
Glucose, Bld: 156 mg/dL — ABNORMAL HIGH (ref 70–99)
Potassium: 3.4 mmol/L — ABNORMAL LOW (ref 3.5–5.1)
Sodium: 133 mmol/L — ABNORMAL LOW (ref 135–145)

## 2018-11-14 LAB — CBC
HCT: 37.7 % (ref 36.0–46.0)
Hemoglobin: 12.6 g/dL (ref 12.0–15.0)
MCH: 32.1 pg (ref 26.0–34.0)
MCHC: 33.4 g/dL (ref 30.0–36.0)
MCV: 96.2 fL (ref 80.0–100.0)
Platelets: 218 10*3/uL (ref 150–400)
RBC: 3.92 MIL/uL (ref 3.87–5.11)
RDW: 13.7 % (ref 11.5–15.5)
WBC: 11 10*3/uL — ABNORMAL HIGH (ref 4.0–10.5)
nRBC: 0 % (ref 0.0–0.2)

## 2018-11-14 MED ORDER — DOCUSATE SODIUM 100 MG PO CAPS
100.0000 mg | ORAL_CAPSULE | Freq: Two times a day (BID) | ORAL | Status: DC
  Administered 2018-11-14 – 2018-11-16 (×5): 100 mg via ORAL
  Filled 2018-11-14 (×5): qty 1

## 2018-11-14 MED ORDER — POTASSIUM CHLORIDE CRYS ER 20 MEQ PO TBCR
20.0000 meq | EXTENDED_RELEASE_TABLET | Freq: Every day | ORAL | Status: AC
  Administered 2018-11-14 – 2018-11-15 (×2): 20 meq via ORAL
  Filled 2018-11-14 (×2): qty 1

## 2018-11-14 NOTE — Progress Notes (Signed)
PT Cancellation Note  Patient Details Name: Ashley Weiss MRN: [id number] DOB: Jun 15, 1919   Cancelled Treatment:    Reason Eval/Treat Not Completed: Patient not medically ready Pt's HR remains elevated at rest 120s+ on amniodarone, to have cardioversion Monday (7/27).  PT held this date.  Kreg Shropshire , DPT 11/14/2018, 12:45 PM

## 2018-11-14 NOTE — Progress Notes (Addendum)
[facility] [place] NAME: Ashley Weiss    MR#:  [id number]  DATE OF BIRTH:  1919-12-30  SUBJECTIVE:  CHIEF COMPLAINT:   Chief Complaint  Patient presents with  . Shortness of Breath   Denies any complaints.  No chest pain or shortness of breath sleeping with her breakfast tray on her stomach.  Patient denies any complaints.  On amiodarone drip.  Heart rate still around 120 bpm. REVIEW OF SYSTEMS:  Review of Systems  Constitutional: Negative for chills and fever.  HENT: Negative for hearing loss.   Eyes: Negative for blurred vision, double vision and photophobia.  Respiratory: Negative for cough, hemoptysis and shortness of breath.   Cardiovascular: Positive for palpitations. Negative for chest pain, orthopnea and leg swelling.  Gastrointestinal: Negative for abdominal pain, diarrhea and vomiting.  Genitourinary: Negative for dysuria and urgency.  Musculoskeletal: Negative for myalgias and neck pain.  Skin: Negative for rash.  Neurological: Negative for dizziness, focal weakness, seizures, weakness and headaches.  Psychiatric/Behavioral: Negative for memory loss. The patient does not have insomnia.     DRUG ALLERGIES:   Allergies  Allergen Reactions  . Hydralazine Itching and Swelling  . Biaxin [Clarithromycin] Nausea And Vomiting  . Ciprofloxacin Other (See Comments)    Hand cramping   . Meloxicam Other (See Comments)    Unknown  . Minocycline Other (See Comments)    Unknown   VITALS:  Blood pressure 131/87, pulse (!) 124, temperature (!) 97.5 F (36.4 C), temperature source Oral, resp. rate 19, height 5\' 2"  (1.575 m), weight 56 kg, SpO2 98 %. PHYSICAL EXAMINATION:  Physical Exam Vitals signs reviewed.  Constitutional:      General: She is not in acute distress.    Appearance: She is well-developed.  HENT:     Head: Normocephalic and atraumatic.  Pulmonary:     Effort: Pulmonary effort is normal. No tachypnea.   Comments: On 2L [place] Abdominal:     Tenderness: There is no abdominal tenderness.  Neurological:     General: No focal deficit present.     Mental Status: She is alert.  Psychiatric:        Behavior: Behavior normal.    LABORATORY PANEL:  Female CBC Recent Labs  Lab 11/14/18 0512  WBC 11.0*  HGB 12.6  HCT 37.7  PLT 218   ------------------------------------------------------------------------------------------------------------------ Chemistries  Recent Labs  Lab 11/10/18 1746  11/14/18 0512  NA 133*   < > 133*  K 4.5   < > 3.4*  CL 95*   < > 92*  CO2 27   < > 34*  GLUCOSE 142*   < > 156*  BUN 19   < > 21  CREATININE 0.55   < > 0.47  CALCIUM 8.9   < > 8.4*  MG 2.1  --   --   AST 47*  --   --   ALT 56*  --   --   ALKPHOS 69  --   --   BILITOT 0.7  --   --    < > = values in this interval not displayed.   RADIOLOGY:  No results found. ASSESSMENT AND PLAN:  85 yo female with known history of atrial fibrillation, CHF, COPD, hypertension, hyperlipidemia who presented with A fib with RVR.   1.Atrial fibrillation with RVR -continue oral diltiazem (s/p dilt gtt) and metoprolol as BP will tolerate -Cardiology following, appreciate recs  -s/p DCCV and converting back to  atrial fibrillation with RVR -Patient started on amiodarone oral dosing with no improvement in rate, per cardiology started on amiodarone drip yesterday, continue over the weekend for a possible cardioversion on 7/27 -O2 per [place] prn, wean to RA as able; patient normally uses qhs  -Continue Eliquis 2.5 mg BID- CHA2DSVASc score of 5.   2. Acute on chronic HFpEF with BL pleural effusions: -Continue IV Lasix to 40mg  BID; i-O2 at 2 L per nasal cannula prn, wean to RA as able -Echo with EF 55-60%.RVSP elevated at 58.3 mmHg. Moderate MV and TV regurgitation. 3. COPD -DuoNeb every 6 hours -O2 per nasal cannula prn  4.  Fall before yesterday. -Patient is high fall risk. Precautions in place no imaging  done yesterday because patient did not have any symptoms of pain.  She is alert, awake, oriented. -Continue to monitor -PT and OT recommending home health; order placed for DME rolling walker  All the records are reviewed and case discussed with Care Management/Social Worker. Management plans discussed with the patient, family and they are in agreement.  CODE STATUS: DNR  POSSIBLE D/C IN 3 DAYS, DEPENDING ON CLINICAL CONDITION.  J7/25/2020 at 9:26 AM  Between 7am to 6pm - Pager - [phone]  After 6pm go to [email or web address] - password [email or web address] [facility]  Office  [phone]  CC: Primary care physician; Lavera Guise, MD

## 2018-11-14 NOTE — Progress Notes (Signed)
Talked to patient's daughter [name] and updated her about the plan of care for this patient.

## 2018-11-14 NOTE — Progress Notes (Addendum)
Talked to tele tech and per tech patient converted to NSR, HR in 70's at 1759. Patient still in amio drip at 33.3 ml/hr. Also notify Dr. Rockey Situ via text page about the patient's conversion. RN will continue to monitor.   Update:2100 Dr. Rockey Situ respond back and gave order to decrease Amiodarone dose to 30 mg/hr. Patient still in NSR.

## 2018-11-14 NOTE — Progress Notes (Signed)
Progress Note  Patient Name: Ashley Weiss Date of Encounter: 11/14/2018  Primary Cardiologist: Kathlyn Sacramento, MD   Subjective   Feels well this morning, no complaints Blood pressure stable, 528 systolic Telemetry reviewed heart rate 100 up to 120s Overnight run of atrial tachycardia/atrial flutter rate 120 On amiodarone infusion  Inpatient Medications    Scheduled Meds: . apixaban  2.5 mg Oral BID  . budesonide  2 mL Inhalation BID  . diltiazem  180 mg Oral Daily  . docusate sodium  100 mg Oral BID  . furosemide  40 mg Intravenous BID  . ipratropium-albuterol  3 mL Nebulization TID  . levothyroxine  25 mcg Oral QAC breakfast  . mouth rinse  15 mL Mouth Rinse BID  . metoprolol tartrate  100 mg Oral BID  . montelukast  10 mg Oral Daily  . potassium chloride  20 mEq Oral Daily  . rosuvastatin  5 mg Oral q1800  . sertraline  100 mg Oral Daily  . sodium chloride flush  3 mL Intravenous Q12H  . sodium chloride flush  3 mL Intravenous Q12H  . [START ON 11/16/2018] sodium chloride flush  3 mL Intravenous Q12H  . vitamin C  500 mg Oral Daily   Continuous Infusions: . sodium chloride    . [START ON 11/16/2018] sodium chloride    . amiodarone 60 mg/hr (11/14/18 1132)   PRN Meds: sodium chloride, acetaminophen, albuterol, ondansetron (ZOFRAN) IV, sodium chloride flush, sodium chloride flush, [START ON 11/16/2018] sodium chloride flush   Vital Signs    Vitals:   11/13/18 1903 11/13/18 1940 11/14/18 0523 11/14/18 0713  BP:  123/72 124/79 131/87  Pulse:  (!) 144 (!) 132 (!) 124  Resp:  20 16 19   Temp:  98.6 F (37 C) 97.9 F (36.6 C) (!) 97.5 F (36.4 C)  TempSrc:  Oral Oral Oral  SpO2: 95% 93% 94% 98%  Weight:   56 kg   Height:        Intake/Output Summary (Last 24 hours) at 11/14/2018 1255 Last data filed at 11/14/2018 0524 Gross per 24 hour  Intake 369.4 ml  Output 900 ml  Net -530.6 ml   Last 3 Weights 11/14/2018 11/12/2018 11/11/2018  Weight (lbs) 123 lb 8 oz  130 lb 130 lb 11.7 oz  Weight (kg) 56.019 kg 58.968 kg 59.3 kg      Telemetry    Atrial fibrillation rate 100 up to 120 bpm- Personally Reviewed  ECG    No new tracings available per EMR  - Personally Reviewed  Physical Exam   GEN: Elderly female, no acute distress  Neck: No JVD. JVP 8+ Cardiac: Irregularly irregular, rapid no murmurs, rubs, or gallops.  Respiratory:  GI: Soft, nontender, non-distended  MS: No edema; No deformity. Neuro:  Nonfocal  Psych: Normal affect   Labs    High Sensitivity Troponin:   Recent Labs  Lab 11/11/18 0010 11/11/18 0317  TROPONINIHS 5 5      Cardiac EnzymesNo results for input(s): TROPONINI in the last 168 hours. No results for input(s): TROPIPOC in the last 168 hours.   Chemistry Recent Labs  Lab 11/10/18 1746  11/12/18 1240 11/13/18 0602 11/14/18 0512  NA 133*   < > 138 135 133*  K 4.5   < > 3.6 3.5 3.4*  CL 95*   < > 94* 91* 92*  CO2 27   < > 35* 33* 34*  GLUCOSE 142*   < > 116* 150* 156*  BUN 19   < > 12 10 21   CREATININE 0.55   < > 0.47 0.40* 0.47  CALCIUM 8.9   < > 8.9 8.6* 8.4*  PROT 7.9  --   --   --   --   ALBUMIN 3.5  --   --   --   --   AST 47*  --   --   --   --   ALT 56*  --   --   --   --   ALKPHOS 69  --   --   --   --   BILITOT 0.7  --   --   --   --   GFRNONAA >60   < > >60 >60 >60  GFRAA >60   < > >60 >60 >60  ANIONGAP 11   < > 9 11 7    < > = values in this interval not displayed.     Hematology Recent Labs  Lab 11/11/18 0317 11/13/18 0602 11/14/18 0512  WBC 9.3 10.9* 11.0*  RBC 3.52* 4.09 3.92  HGB 11.4* 13.3 12.6  HCT 35.5* 40.4 37.7  MCV 100.9* 98.8 96.2  MCH 32.4 32.5 32.1  MCHC 32.1 32.9 33.4  RDW 13.9 13.6 13.7  PLT 195 219 218    BNP Recent Labs  Lab 11/10/18 1746 11/11/18 0010  BNP 312.0* 283.0*     DDimer No results for input(s): DDIMER in the last 168 hours.   Radiology    Dg Chest Port 1 View  Result Date: 11/14/2018 CLINICAL DATA:  Shortness of breath and pleural  effusion EXAM: PORTABLE CHEST 1 VIEW COMPARISON:  11/12/2018 and prior exams FINDINGS: Cardiomegaly with pulmonary vascular congestion again noted. Pulmonary edema has decreased since the prior study. Bilateral pleural effusions and bibasilar atelectasis/consolidation appear slightly decreased. No pneumothorax or acute bony abnormality identified. IMPRESSION: Decreased pulmonary edema. Slightly decreased bilateral pleural effusions and bibasilar atelectasis/consolidation. Electronically Signed   By: Margarette Canada M.D.   On: 11/14/2018 11:39    Cardiac Studies    Echo 11/11/2018 1. The left ventricle has normal systolic function, with an ejection fraction of 55-60%. The cavity size was normal. Left ventricular diastolic Doppler parameters are consistent with restrictive filling. Elevated mean left atrial pressure No evidence of  left ventricular regional wall motion abnormalities.  2. The right ventricle has mildly reduced systolic function. The cavity was normal. There is no increase in right ventricular wall thickness. Right ventricular systolic pressure is moderately elevated with an estimated pressure of 58.3 mmHg.  3. Left atrial size was moderately dilated.  4. Right atrial size was mildly dilated.  5. The mitral valve is degenerative. Mild thickening of the mitral valve leaflet. There is mild mitral annular calcification present. Mitral valve regurgitation is moderate by color flow Doppler.  6. Tricuspid valve regurgitation is moderate.  7. The aortic valve is tricuspid. Moderate thickening of the aortic valve. Sclerosis without any evidence of stenosis of the aortic valve. Aortic valve regurgitation is trivial by color flow Doppler.  8. The aorta is normal in size and structure.  9. The inferior vena cava was normal in size with <50% respiratory variability.  Echo  10/22/2018 See scan under CV procedures tab: She underwent an echocardiogram that showed normal LV systolic function with  moderately dilated left atrium, mild aortic stenosis, mild to moderate tricuspid regurgitation, and mild pulmonary hypertension.    Patient Profile     85 y.o. female with a hx of paroxysmal  atrial fibrillation with rapid ventricular response s/p DCCV 7/22, possible tachybradycardia syndrome, hyperlipidemia, essential hypertension, and who is being seen today for the evaluation of atrial fibrillation with rapid ventricular response s/p DCCV 7/22.  Assessment & Plan    Atrial Fibrillation with RVR - S/p DCCV and converting back to atrial tachycardia followed by atrial flutter followed by atrial fibrillation with RVR Overnight with atrial fibrillation, unable to exclude several hours of flutter ---We will continue amiodarone at 1 mg/min load, Plan for cardioversion Monday morning --- Continue metoprolol and diltiazem as blood pressure tolerates - CHA2DS2VASc score of 5  - Eliquis 2.5mg  BID   Respiratory distress  Bilateral Pleural Effusions / Pulmonary HTN (EF 55-60%) / COPD -In the setting of atrial fibrillation with RVR Echo as above with normal EF, RVSP 58.76mmHg.  CXR  with persistent interstitial and alveolar edema, effusions with R>L,  Would continue continue IV lasix  40mg  BID.   Total encounter time more than 25 minutes  Greater than 50% was spent in counseling and coordination of care with the patient   For questions or updates, please contact [place] Please consult [email or web address] for contact info under        Signed, Ida Rogue, MD  11/14/2018, 12:55 PM

## 2018-11-14 NOTE — Progress Notes (Signed)
OT Cancellation Note  Patient Details Name: SARINA ROBLETO MRN: [id number] DOB: 08-29-1919   Cancelled Treatment:    Reason Eval/Treat Not Completed: Medical issues which prohibited therapy(Pt. with elevated HR currently on Amiodarone Infusion with plans for Cardioversion on Monday 11/16/2018. Will hold OT services at this time, monitor, and resume when medically appropriate.)  Harrel Carina, MS, OTR/L 11/14/2018, 1:16 PM

## 2018-11-15 LAB — BASIC METABOLIC PANEL
Anion gap: 7 (ref 5–15)
BUN: 21 mg/dL (ref 8–23)
CO2: 34 mmol/L — ABNORMAL HIGH (ref 22–32)
Calcium: 8.3 mg/dL — ABNORMAL LOW (ref 8.9–10.3)
Chloride: 92 mmol/L — ABNORMAL LOW (ref 98–111)
Creatinine, Ser: 0.45 mg/dL (ref 0.44–1.00)
GFR calc Af Amer: >60 mL/min (ref >60)
GFR calc non Af Amer: >60 mL/min (ref >60)
Glucose, Bld: 157 mg/dL — ABNORMAL HIGH (ref 70–99)
Potassium: 3 mmol/L — ABNORMAL LOW (ref 3.5–5.1)
Sodium: 133 mmol/L — ABNORMAL LOW (ref 135–145)

## 2018-11-15 LAB — CULTURE, BLOOD (ROUTINE X 2): Culture: NO GROWTH

## 2018-11-15 MED ORDER — DILTIAZEM HCL ER COATED BEADS 120 MG PO CP24
120.0000 mg | ORAL_CAPSULE | Freq: Every day | ORAL | Status: DC
  Administered 2018-11-16: 120 mg via ORAL
  Filled 2018-11-15: qty 1

## 2018-11-15 MED ORDER — POTASSIUM CHLORIDE CRYS ER 20 MEQ PO TBCR
40.0000 meq | EXTENDED_RELEASE_TABLET | ORAL | Status: AC
  Administered 2018-11-15 (×2): 40 meq via ORAL
  Filled 2018-11-15 (×2): qty 2

## 2018-11-15 MED ORDER — AMIODARONE HCL 200 MG PO TABS
400.0000 mg | ORAL_TABLET | Freq: Two times a day (BID) | ORAL | Status: DC
  Administered 2018-11-15 – 2018-11-16 (×3): 400 mg via ORAL
  Filled 2018-11-15 (×3): qty 2

## 2018-11-15 NOTE — Progress Notes (Addendum)
[facility] [place] NAME: Ashley Weiss    MR#:  [id number]  DATE OF BIRTH:  Nov 17, 1919  SUBJECTIVE: Denies any complaints, heart rate stable at 75 bpm this morning.  CHIEF COMPLAINT:   Chief Complaint  Patient presents with  . Shortness of Breath   Denies any complaints.  No chest pain or shortness of breath sleeping with her breakfast tray on her stomach.  Patient denies any complaints.  On amiodarone drip.  Heart rate still around 120 bpm. REVIEW OF SYSTEMS:  Review of Systems  Constitutional: Negative for chills and fever.  HENT: Negative for hearing loss.   Eyes: Negative for blurred vision, double vision and photophobia.  Respiratory: Negative for cough, hemoptysis and shortness of breath.   Cardiovascular: Negative for chest pain, orthopnea and leg swelling.  Gastrointestinal: Negative for abdominal pain, diarrhea and vomiting.  Genitourinary: Negative for dysuria and urgency.  Musculoskeletal: Negative for myalgias and neck pain.  Skin: Negative for rash.  Neurological: Negative for dizziness, focal weakness, seizures, weakness and headaches.  Psychiatric/Behavioral: Negative for memory loss. The patient does not have insomnia.     DRUG ALLERGIES:   Allergies  Allergen Reactions  . Hydralazine Itching and Swelling  . Biaxin [Clarithromycin] Nausea And Vomiting  . Ciprofloxacin Other (See Comments)    Hand cramping   . Meloxicam Other (See Comments)    Unknown  . Minocycline Other (See Comments)    Unknown   VITALS:  Blood pressure 100/77, pulse 71, temperature 97.8 F (36.6 C), temperature source Oral, resp. rate 18, height 5\' 2"  (1.575 m), weight 56 kg, SpO2 98 %. PHYSICAL EXAMINATION:  Physical Exam Vitals signs reviewed.  Constitutional:      General: She is not in acute distress.    Appearance: She is well-developed.  HENT:     Head: Normocephalic and atraumatic.  Pulmonary:     Effort: Pulmonary effort  is normal. No tachypnea.     Comments: On 2L [place] Abdominal:     Tenderness: There is no abdominal tenderness.  Neurological:     General: No focal deficit present.     Mental Status: She is alert.  Psychiatric:        Behavior: Behavior normal.    LABORATORY PANEL:  Female CBC Recent Labs  Lab 11/14/18 0512  WBC 11.0*  HGB 12.6  HCT 37.7  PLT 218   ------------------------------------------------------------------------------------------------------------------ Chemistries  Recent Labs  Lab 11/10/18 1746  11/15/18 0412  NA 133*   < > 133*  K 4.5   < > 3.0*  CL 95*   < > 92*  CO2 27   < > 34*  GLUCOSE 142*   < > 157*  BUN 19   < > 21  CREATININE 0.55   < > 0.45  CALCIUM 8.9   < > 8.3*  MG 2.1  --   --   AST 47*  --   --   ALT 56*  --   --   ALKPHOS 69  --   --   BILITOT 0.7  --   --    < > = values in this interval not displayed.   RADIOLOGY:  No results found. ASSESSMENT AND PLAN:  85 yo female with known history of atrial fibrillation, CHF, COPD, hypertension, hyperlipidemia who presented with A fib with RVR.   1.Atrial fibrillation with RVR -continue oral diltiazem (s/p dilt gtt) and metoprolol as BP will tolerate -Cardiology following,  appreciate recs  -s/p DCCV and converting back to atrial fibrillation with RVR -Continue amiodarone drip, cardioversion tomorrow.-O2 per [place] prn, wean to RA as able; patient normally uses qhs  -Continue Eliquis 2.5 mg BID- CHA2DSVASc score of 5.   2. Acute on chronic HFpEF with BL pleural effusions: -Continue IV Lasix to 40mg  BID; i-O2 at 2 L per nasal cannula prn, wean to RA as able, now has hypokalemia, replace potassium. -Echo with EF 55-60%.RVSP elevated at 58.3 mmHg. Moderate MV and TV regurgitation. 3. COPD -DuoNeb every 6 hours -O2 per nasal cannula prn  4.  Fall at home -Patient is high fall risk. Precautions in place no imaging done yesterday because patient did not have any symptoms of pain.  She is alert,  awake, oriented. -Continue to monitor -PT and OT recommending home health; order placed for DME rolling walker  All the records are reviewed and case discussed with Care Management/Social Worker. Management plans discussed with the patient, family and they are in agreement.  CODE STATUS: DNR  POSSIBLE D/C IN 3 DAYS, DEPENDING ON CLINICAL CONDITION.    11/15/2018 at 1:32 PM  Between 7am to 6pm - Pager - [phone]  After 6pm go to [email or web address] - password [email or web address] [facility]  Office  [phone]  CC: Primary care physician; Lavera Guise, MD

## 2018-11-15 NOTE — Progress Notes (Signed)
Heart rate has been stable this shift. No complications noted. Patient rested without any complications.

## 2018-11-15 NOTE — Progress Notes (Signed)
Progress Note  Patient Name: Ashley Weiss Date of Encounter: 11/15/2018  Primary Cardiologist: Kathlyn Sacramento, MD   Subjective   No complaints this morning, feels more " calm in her chest" Denies shortness of breath or chest tightness Converted to normal sinus rhythm yesterday around 6 PM, 10 normal sinus rhythm overnight and this morning heart rate in the 70s Blood pressure borderline low but denies orthostasis symptoms  Continues on Lasix IV twice daily, stable renal function Low potassium  Inpatient Medications    Scheduled Meds: . amiodarone  400 mg Oral BID  . apixaban  2.5 mg Oral BID  . budesonide  2 mL Inhalation BID  . [START ON 11/16/2018] diltiazem  120 mg Oral Daily  . docusate sodium  100 mg Oral BID  . furosemide  40 mg Intravenous BID  . ipratropium-albuterol  3 mL Nebulization TID  . levothyroxine  25 mcg Oral QAC breakfast  . mouth rinse  15 mL Mouth Rinse BID  . metoprolol tartrate  100 mg Oral BID  . montelukast  10 mg Oral Daily  . rosuvastatin  5 mg Oral q1800  . sertraline  100 mg Oral Daily  . sodium chloride flush  3 mL Intravenous Q12H  . sodium chloride flush  3 mL Intravenous Q12H  . vitamin C  500 mg Oral Daily   Continuous Infusions: . sodium chloride     PRN Meds: sodium chloride, acetaminophen, albuterol, ondansetron (ZOFRAN) IV, sodium chloride flush, sodium chloride flush   Vital Signs    Vitals:   11/14/18 2337 11/15/18 0312 11/15/18 0558 11/15/18 0828  BP: (!) 101/53 (!) 113/53  100/77  Pulse: 65 68  71  Resp:  18  18  Temp:  98.6 F (37 C)  97.8 F (36.6 C)  TempSrc:  Oral  Oral  SpO2: 96% 97%  98%  Weight:   56 kg   Height:        Intake/Output Summary (Last 24 hours) at 11/15/2018 1335 Last data filed at 11/15/2018 1106 Gross per 24 hour  Intake 1418.01 ml  Output 1200 ml  Net 218.01 ml   Last 3 Weights 11/15/2018 11/14/2018 11/14/2018  Weight (lbs) 123 lb 8 oz 123 lb 14 oz 123 lb 8 oz  Weight (kg) 56.019 kg  56.19 kg 56.019 kg      Telemetry    Atrial fibrillation rate 100 up to 120 bpm- Personally Reviewed  ECG    No new tracings available per EMR  - Personally Reviewed  Physical Exam   Constitutional:  oriented to person, place, and time. No distress.  HENT:  Head: Grossly normal Eyes:  no discharge. No scleral icterus.  Neck: No JVD, no carotid bruits  Cardiovascular: Regular rate and rhythm, no murmurs appreciated Pulmonary/Chest: Clear to auscultation bilaterally, no wheezes or rails Abdominal: Soft.  no distension.  no tenderness.  Musculoskeletal: Normal range of motion Neurological:  normal muscle tone. Coordination normal. No atrophy Skin: Skin warm and dry Psychiatric: normal affect, pleasant   Labs    High Sensitivity Troponin:   Recent Labs  Lab 11/11/18 0010 11/11/18 0317  TROPONINIHS 5 5      Cardiac EnzymesNo results for input(s): TROPONINI in the last 168 hours. No results for input(s): TROPIPOC in the last 168 hours.   Chemistry Recent Labs  Lab 11/10/18 1746  11/13/18 0602 11/14/18 0512 11/15/18 0412  NA 133*   < > 135 133* 133*  K 4.5   < >  3.5 3.4* 3.0*  CL 95*   < > 91* 92* 92*  CO2 27   < > 33* 34* 34*  GLUCOSE 142*   < > 150* 156* 157*  BUN 19   < > 10 21 21   CREATININE 0.55   < > 0.40* 0.47 0.45  CALCIUM 8.9   < > 8.6* 8.4* 8.3*  PROT 7.9  --   --   --   --   ALBUMIN 3.5  --   --   --   --   AST 47*  --   --   --   --   ALT 56*  --   --   --   --   ALKPHOS 69  --   --   --   --   BILITOT 0.7  --   --   --   --   GFRNONAA >60   < > >60 >60 >60  GFRAA >60   < > >60 >60 >60  ANIONGAP 11   < > 11 7 7    < > = values in this interval not displayed.     Hematology Recent Labs  Lab 11/11/18 0317 11/13/18 0602 11/14/18 0512  WBC 9.3 10.9* 11.0*  RBC 3.52* 4.09 3.92  HGB 11.4* 13.3 12.6  HCT 35.5* 40.4 37.7  MCV 100.9* 98.8 96.2  MCH 32.4 32.5 32.1  MCHC 32.1 32.9 33.4  RDW 13.9 13.6 13.7  PLT 195 219 218    BNP Recent Labs   Lab 11/10/18 1746 11/11/18 0010  BNP 312.0* 283.0*     DDimer No results for input(s): DDIMER in the last 168 hours.   Radiology    Dg Chest Port 1 View  Result Date: 11/14/2018 CLINICAL DATA:  Shortness of breath and pleural effusion EXAM: PORTABLE CHEST 1 VIEW COMPARISON:  11/12/2018 and prior exams FINDINGS: Cardiomegaly with pulmonary vascular congestion again noted. Pulmonary edema has decreased since the prior study. Bilateral pleural effusions and bibasilar atelectasis/consolidation appear slightly decreased. No pneumothorax or acute bony abnormality identified. IMPRESSION: Decreased pulmonary edema. Slightly decreased bilateral pleural effusions and bibasilar atelectasis/consolidation. Electronically Signed   By: Margarette Canada M.D.   On: 11/14/2018 11:39    Cardiac Studies    Echo 11/11/2018 1. The left ventricle has normal systolic function, with an ejection fraction of 55-60%. The cavity size was normal. Left ventricular diastolic Doppler parameters are consistent with restrictive filling. Elevated mean left atrial pressure No evidence of  left ventricular regional wall motion abnormalities.  2. The right ventricle has mildly reduced systolic function. The cavity was normal. There is no increase in right ventricular wall thickness. Right ventricular systolic pressure is moderately elevated with an estimated pressure of 58.3 mmHg.  3. Left atrial size was moderately dilated.  4. Right atrial size was mildly dilated.  5. The mitral valve is degenerative. Mild thickening of the mitral valve leaflet. There is mild mitral annular calcification present. Mitral valve regurgitation is moderate by color flow Doppler.  6. Tricuspid valve regurgitation is moderate.  7. The aortic valve is tricuspid. Moderate thickening of the aortic valve. Sclerosis without any evidence of stenosis of the aortic valve. Aortic valve regurgitation is trivial by color flow Doppler.  8. The aorta is normal in  size and structure.  9. The inferior vena cava was normal in size with <50% respiratory variability.  Echo  10/22/2018 See scan under CV procedures tab: She underwent an echocardiogram that showed normal LV systolic function  with moderately dilated left atrium, mild aortic stenosis, mild to moderate tricuspid regurgitation, and mild pulmonary hypertension.    Patient Profile     85 y.o. female with a hx of paroxysmal atrial fibrillation with rapid ventricular response s/p DCCV 7/22, possible tachybradycardia syndrome, hyperlipidemia, essential hypertension, and who is being seen today for the evaluation of atrial fibrillation with rapid ventricular response s/p DCCV 7/22.  Assessment & Plan    Atrial Fibrillation with RVR - S/p DCCV and converting back to atrial tachycardia followed by atrial flutter followed by atrial fibrillation with RVR --- Amiodarone infusion, converting to normal sinus rhythm last night --We will cancel cardioversion scheduled for tomorrow morning --- Continue metoprolol and diltiazem as blood pressure tolerates Decrease diltiazem from 180 down to extended release 120 mg daily - CHA2DS2VASc score of 5  - Eliquis 2.5mg  BID  --- We will change off the amiodarone infusion to oral amiodarone 400 twice daily, continue load for 5 days before changing dose down to 200 twice daily  Respiratory distress  Bilateral Pleural Effusions / Pulmonary HTN (EF 55-60%) / COPD -In the setting of atrial fibrillation with RVR Echo as above with normal EF, RVSP 58.35mmHg.  CXR  with persistent interstitial and alveolar edema, effusions with R>L,  Would continue continue IV lasix  40mg  BID --- Close monitoring of renal function and blood pressure, We will likely need to transition to oral Lasix in the next day or so   Total encounter time more than 25 minutes  Greater than 50% was spent in counseling and coordination of care with the patient   For questions or updates, please contact  [place] Please consult [email or web address] for contact info under        Signed, Ida Rogue, MD  11/15/2018, 1:35 PM

## 2018-11-16 ENCOUNTER — Telehealth: Payer: Self-pay | Admitting: Cardiovascular Disease

## 2018-11-16 ENCOUNTER — Encounter: Admission: RE | Payer: Self-pay | Source: Home / Self Care

## 2018-11-16 ENCOUNTER — Ambulatory Visit: Admission: RE | Admit: 2018-11-16 | Payer: Medicare Other | Source: Home / Self Care | Admitting: Cardiovascular Disease

## 2018-11-16 LAB — BASIC METABOLIC PANEL
Anion gap: 8 (ref 5–15)
BUN: 21 mg/dL (ref 8–23)
CO2: 33 mmol/L — ABNORMAL HIGH (ref 22–32)
Calcium: 8.7 mg/dL — ABNORMAL LOW (ref 8.9–10.3)
Chloride: 97 mmol/L — ABNORMAL LOW (ref 98–111)
Creatinine, Ser: 0.4 mg/dL — ABNORMAL LOW (ref 0.44–1.00)
GFR calc Af Amer: >60 mL/min (ref >60)
GFR calc non Af Amer: >60 mL/min (ref >60)
Glucose, Bld: 143 mg/dL — ABNORMAL HIGH (ref 70–99)
Potassium: 4.1 mmol/L (ref 3.5–5.1)
Sodium: 138 mmol/L (ref 135–145)

## 2018-11-16 LAB — CULTURE, BLOOD (ROUTINE X 2): Special Requests: ADEQUATE

## 2018-11-16 LAB — CBC
HCT: 39 % (ref 36.0–46.0)
Hemoglobin: 12.5 g/dL (ref 12.0–15.0)
MCH: 31.9 pg (ref 26.0–34.0)
MCHC: 32.1 g/dL (ref 30.0–36.0)
MCV: 99.5 fL (ref 80.0–100.0)
Platelets: 231 10*3/uL (ref 150–400)
RBC: 3.92 MIL/uL (ref 3.87–5.11)
RDW: 13.6 % (ref 11.5–15.5)
WBC: 8.6 10*3/uL (ref 4.0–10.5)
nRBC: 0 % (ref 0.0–0.2)

## 2018-11-16 SURGERY — CARDIOVERSION
Anesthesia: General

## 2018-11-16 MED ORDER — AMIODARONE HCL 200 MG PO TABS: 400.0000 mg | ORAL_TABLET | Freq: Two times a day (BID) | ORAL | 0 refills | Status: DC

## 2018-11-16 MED ORDER — FUROSEMIDE 40 MG PO TABS
40.0000 mg | ORAL_TABLET | Freq: Every day | ORAL | Status: DC
  Administered 2018-11-16: 40 mg via ORAL
  Filled 2018-11-16: qty 1

## 2018-11-16 MED ORDER — POTASSIUM CHLORIDE CRYS ER 10 MEQ PO TBCR: 10.0000 meq | EXTENDED_RELEASE_TABLET | Freq: Every day | ORAL | 0 refills | Status: DC

## 2018-11-16 MED ORDER — FUROSEMIDE 40 MG PO TABS: 40.0000 mg | ORAL_TABLET | Freq: Every day | ORAL | 0 refills | Status: DC

## 2018-11-16 MED ORDER — POTASSIUM CHLORIDE CRYS ER 20 MEQ PO TBCR
20.0000 meq | EXTENDED_RELEASE_TABLET | Freq: Every day | ORAL | Status: DC
  Administered 2018-11-16: 20 meq via ORAL
  Filled 2018-11-16: qty 1

## 2018-11-16 MED ORDER — DILTIAZEM HCL ER COATED BEADS 120 MG PO CP24: 120.0000 mg | ORAL_CAPSULE | Freq: Every day | ORAL | 0 refills | Status: DC

## 2018-11-16 NOTE — Telephone Encounter (Signed)
TCM....  Patient is being discharged   They saw Dr Fletcher Anon   They are scheduled to see R. Dunn  on 8/6  They were seen for Afib with RVR   They need to be seen within 1-2 weeks   Pt not added to wait list   Please call

## 2018-11-16 NOTE — Telephone Encounter (Signed)
Currently admitted at this time. 

## 2018-11-16 NOTE — Progress Notes (Signed)
Spoke with daughter [name] about patient's progress on 7/26 at 21:30. Discussed side effects and purpose of the amiodarone. Explained the reason for discontinuing the purewick.  Daughter is in agreement with plan of care.

## 2018-11-16 NOTE — Progress Notes (Signed)
p to be discharged this afternoon. Iv and tele removed. disch instructions give n to pt's daughters.  disch via w.c.. jdaughters to transport.

## 2018-11-16 NOTE — Plan of Care (Signed)
  Problem: Education: Goal: Knowledge of General Education information will improve Description: Including pain rating scale, medication(s)/side effects and non-pharmacologic comfort measures Outcome: Progressing Note: Discussed with daughter via phone   Problem: Clinical Measurements: Goal: Ability to maintain clinical measurements within normal limits will improve Outcome: Progressing Goal: Will remain free from infection Outcome: Progressing Goal: Respiratory complications will improve Outcome: Progressing Goal: Cardiovascular complication will be avoided Outcome: Progressing   Problem: Activity: Goal: Risk for activity intolerance will decrease Outcome: Progressing   Problem: Education: Goal: Knowledge of disease or condition will improve Outcome: Progressing Note: Educated [place]   Problem: Cardiac: Goal: Ability to achieve and maintain adequate cardiopulmonary perfusion will improve Outcome: Progressing   Problem: Pain Managment: Goal: General experience of comfort will improve Outcome: Completed/Met   Problem: Safety: Goal: Ability to remain free from injury will improve Outcome: Completed/Met

## 2018-11-16 NOTE — Progress Notes (Addendum)
Cardiovascular and Pulmonary Nurse Navigator Note:    85 yo female with known hx of atrial fibrillation, CHF, COPD, HTN, HLD who presented wit aFib with RVR,  s/p DCCV 7/22, possible tachybradycardia syndrome.    Active problems this admission: 1. Atrial fibrillation with RVR.  Patient is now on oral amiodarone.   2. Acute on Chronic HFpEF with bilateral pleural effusions.   Echowith EF 55-60%.RVSP elevated at58.3 mmHg. Moderate MV and TV regurgitation. 3. COPD - Patient's baseline is oxygen in use at HS only.   4. Fall at home - PT and OT recommending [place].  Order for rolling walker has been placed.    Rounded on patient.  Patient stated she is feeling so much better than when she was first admitted to hospital.  Patient has been hospitalized for 6 days.   Patient for possible discharge today.  Patient lives at home alone and does all her own cooking and housework except vacuuming.  Patient reports she has functioning scale at home, but does not weigh herself daily.  Encouraged patient to weigh daily after getting up in the morning, urinating, and then weigh.   *Reviewed with patient the following information:  *Discussed when to call the Dr= weight gain of >2-3lb overnight of 5lb in a week, *Discussed yellow zone= call MD: weight gain of >2-3lb overnight of 5lb in a week, increased swelling, increased SOB when lying down, chest discomfort, dizziness, increased fatigue  *Red Zone= call 911: struggle to breath, fainting or near fainting, significant chest pain ?  *Heart Failure Zone Magnet given and reviewed with patient.  *Reviewed with patient the following information:  *Discussed when to call the Dr= weight gain of >2-3lb overnight of 5lb in a week,  *Discussed yellow zone= call MD: weight gain of >2-3lb overnight of 5lb in a week, increased swelling, increased SOB when lying down, chest discomfort, dizziness, increased fatigue  *Red Zone= call 911: struggle to breath, fainting or  near fainting, significant chest pain ?  *Heart Failure Zone Magnet given and reviewed with patient.   *Diet - Patient currently ordered Heart Healthy Diet.  Patient has declined offer of Dietitian Consultation.  Patient stated, "My daughter is a dietitian."   Patient stated she does her own cooking and does not use salt when she cooks.     *Discussed fluid intake with patient as well. Patient not currently on a fluid restriction, but advised no more than 64 ounces of fluid per day.  *Smoking Cessation- Patient is a NEVER smoker.   *Discussed exercise / activity.  Physical Therapy has recommended HH PT.  Rolling walker has been ordered for patient.  As previously stated, patient does all of her own household chores including cooking, with exception of vacuuming.  Encouraged patient to remain as active as possible    Patient is not followed in the [facility].  Patient's current outpatient treatment team includes:   Dr. Fletcher Anon, Dr. Clayborn Bigness, Dr. Dr. Bea Laura.    [facility] is going to be following patient at home.  HH with RN and PT.    Again, the 5 Steps to Living Better with Heart Failure were reviewed with patient.  Patient thanked me for providing the above information.   Roanna Epley, RN, BSN, [facility]  Cardiovascular & Pulmonary Nurse Navigator  Direct Line: [phone]  Department Phone #: [phone] Fax: [phone]  Email Address: [email or web address]

## 2018-11-16 NOTE — Progress Notes (Signed)
Progress Note  Patient Name: Ashley Weiss Date of Encounter: 11/16/2018  Primary Cardiologist: Kathlyn Sacramento, MD   Subjective   She remains in sinus rhythm.  She feels significantly better with improved shortness of breath.  No chest pain.  Inpatient Medications    Scheduled Meds:  amiodarone  400 mg Oral BID   apixaban  2.5 mg Oral BID   budesonide  2 mL Inhalation BID   diltiazem  120 mg Oral Daily   docusate sodium  100 mg Oral BID   furosemide  40 mg Oral Daily   ipratropium-albuterol  3 mL Nebulization TID   levothyroxine  25 mcg Oral QAC breakfast   mouth rinse  15 mL Mouth Rinse BID   metoprolol tartrate  100 mg Oral BID   montelukast  10 mg Oral Daily   potassium chloride  20 mEq Oral Daily   rosuvastatin  5 mg Oral q1800   sertraline  100 mg Oral Daily   sodium chloride flush  3 mL Intravenous Q12H   sodium chloride flush  3 mL Intravenous Q12H   vitamin C  500 mg Oral Daily   Continuous Infusions:  sodium chloride     PRN Meds: sodium chloride, acetaminophen, albuterol, ondansetron (ZOFRAN) IV, sodium chloride flush, sodium chloride flush   Vital Signs    Vitals:   11/15/18 1952 11/16/18 0310 11/16/18 0313 11/16/18 0744  BP: 125/66  125/65   Pulse: 79  73 76  Resp: 18  18 18   Temp: 98.2 F (36.8 C)  98.4 F (36.9 C)   TempSrc: Oral  Oral   SpO2: 99%  97% 96%  Weight:  54.7 kg    Height:        Intake/Output Summary (Last 24 hours) at 11/16/2018 0805 Last data filed at 11/16/2018 0316 Gross per 24 hour  Intake 600 ml  Output 1050 ml  Net -450 ml   Last 3 Weights 11/16/2018 11/15/2018 11/14/2018  Weight (lbs) 120 lb 9.6 oz 123 lb 8 oz 123 lb 14 oz  Weight (kg) 54.704 kg 56.019 kg 56.19 kg      Telemetry    Normal sinus rhythm with a heart rate in the 70s and 80s.- Personally Reviewed  ECG    No new tracings available per EMR  - Personally Reviewed  Physical Exam   Constitutional:  oriented to person, place, and  time. No distress.  HENT:  Head: Grossly normal Eyes:  no discharge. No scleral icterus.  Neck: No JVD, no carotid bruits  Cardiovascular: Regular rate and rhythm, no murmurs appreciated Pulmonary/Chest: Few bibasilar crackles. Abdominal: Soft.  no distension.  no tenderness.  Musculoskeletal: Normal range of motion Neurological:  normal muscle tone. Coordination normal. No atrophy Skin: Skin warm and dry Psychiatric: normal affect, pleasant   Labs    High Sensitivity Troponin:   Recent Labs  Lab 11/11/18 0010 11/11/18 0317  TROPONINIHS 5 5      Cardiac EnzymesNo results for input(s): TROPONINI in the last 168 hours. No results for input(s): TROPIPOC in the last 168 hours.   Chemistry Recent Labs  Lab 11/10/18 1746  11/14/18 0512 11/15/18 0412 11/16/18 0521  NA 133*   < > 133* 133* 138  K 4.5   < > 3.4* 3.0* 4.1  CL 95*   < > 92* 92* 97*  CO2 27   < > 34* 34* 33*  GLUCOSE 142*   < > 156* 157* 143*  BUN 19   < >  21 21 21   CREATININE 0.55   < > 0.47 0.45 0.40*  CALCIUM 8.9   < > 8.4* 8.3* 8.7*  PROT 7.9  --   --   --   --   ALBUMIN 3.5  --   --   --   --   AST 47*  --   --   --   --   ALT 56*  --   --   --   --   ALKPHOS 69  --   --   --   --   BILITOT 0.7  --   --   --   --   GFRNONAA >60   < > >60 >60 >60  GFRAA >60   < > >60 >60 >60  ANIONGAP 11   < > 7 7 8    < > = values in this interval not displayed.     Hematology Recent Labs  Lab 11/13/18 0602 11/14/18 0512 11/16/18 0521  WBC 10.9* 11.0* 8.6  RBC 4.09 3.92 3.92  HGB 13.3 12.6 12.5  HCT 40.4 37.7 39.0  MCV 98.8 96.2 99.5  MCH 32.5 32.1 31.9  MCHC 32.9 33.4 32.1  RDW 13.6 13.7 13.6  PLT 219 218 231    BNP Recent Labs  Lab 11/10/18 1746 11/11/18 0010  BNP 312.0* 283.0*     DDimer No results for input(s): DDIMER in the last 168 hours.   Radiology    Dg Chest Port 1 View  Result Date: 11/14/2018 CLINICAL DATA:  Shortness of breath and pleural effusion EXAM: PORTABLE CHEST 1 VIEW  COMPARISON:  11/12/2018 and prior exams FINDINGS: Cardiomegaly with pulmonary vascular congestion again noted. Pulmonary edema has decreased since the prior study. Bilateral pleural effusions and bibasilar atelectasis/consolidation appear slightly decreased. No pneumothorax or acute bony abnormality identified. IMPRESSION: Decreased pulmonary edema. Slightly decreased bilateral pleural effusions and bibasilar atelectasis/consolidation. Electronically Signed   By: Margarette Canada M.D.   On: 11/14/2018 11:39    Cardiac Studies    Echo 11/11/2018 1. The left ventricle has normal systolic function, with an ejection fraction of 55-60%. The cavity size was normal. Left ventricular diastolic Doppler parameters are consistent with restrictive filling. Elevated mean left atrial pressure No evidence of  left ventricular regional wall motion abnormalities.  2. The right ventricle has mildly reduced systolic function. The cavity was normal. There is no increase in right ventricular wall thickness. Right ventricular systolic pressure is moderately elevated with an estimated pressure of 58.3 mmHg.  3. Left atrial size was moderately dilated.  4. Right atrial size was mildly dilated.  5. The mitral valve is degenerative. Mild thickening of the mitral valve leaflet. There is mild mitral annular calcification present. Mitral valve regurgitation is moderate by color flow Doppler.  6. Tricuspid valve regurgitation is moderate.  7. The aortic valve is tricuspid. Moderate thickening of the aortic valve. Sclerosis without any evidence of stenosis of the aortic valve. Aortic valve regurgitation is trivial by color flow Doppler.  8. The aorta is normal in size and structure.  9. The inferior vena cava was normal in size with <50% respiratory variability.  Echo  10/22/2018 See scan under CV procedures tab: She underwent an echocardiogram that showed normal LV systolic function with moderately dilated left atrium, mild aortic  stenosis, mild to moderate tricuspid regurgitation, and mild pulmonary hypertension.    Patient Profile     85 y.o. female with a hx of paroxysmal atrial fibrillation with rapid ventricular response  s/p DCCV 7/22, possible tachybradycardia syndrome, hyperlipidemia, essential hypertension, and who is being seen today for the evaluation of atrial fibrillation with rapid ventricular response s/p DCCV 7/22.  Assessment & Plan    Atrial Fibrillation with RVR - S/p DCCV and converting back to atrial tachycardia followed by atrial flutter followed by atrial fibrillation with RVR.  She converted back to normal sinus rhythm with amiodarone infusion. Continue amiodarone 400 mg twice daily for another 3 days then decrease to 200 mg twice daily. Continue current dose of diltiazem and metoprolol.  We might be able to discontinue diltiazem in the near future. - CHA2DS2VASc score of 5  - Eliquis 2.5mg  BID    Acute diastolic heart failure due to A. fib with RVR: Moderate pulmonary hypertension. Echo as above with normal EF, RVSP 58.38mmHg.  Reviewed serial chest x-rays which showed initially significant pulmonary edema worse on the right side.  This has gradually improved.  I switch furosemide to 40 mg by mouth once daily with small dose potassium. The patient uses oxygen at night only at home.  I suspect she is no longer hypoxic and should wean off nasal cannula. Ambulate today and if she feels well enough, she can likely be discharged home in the afternoon.  I called the daughter and updated her about the patient's condition.    Total encounter time more than 25 minutes  Greater than 50% was spent in counseling and coordination of care with the patient   For questions or updates, please contact [place] Please consult [email or web address] for contact info under        Signed, Kathlyn Sacramento, MD  11/16/2018, 8:05 AM

## 2018-11-16 NOTE — Care Management Important Message (Signed)
Important Message  Patient Details  Name: Ashley Weiss MRN: [id number] Date of Birth: Aug 20, 1919   Medicare Important Message Given:  Yes     Dannette Barbara 11/16/2018, 12:04 PM

## 2018-11-16 NOTE — TOC Transition Note (Signed)
Transition of Care [facility]) - CM/SW Discharge Note   Patient Details  Name: Ashley Weiss MRN: [id number] Date of Birth: 1920/02/13  [facility] CM/SW Contact:  Shade Flood, LCSW Phone Number: 11/16/2018, 12:18 PM   Clinical Narrative:     Pt will likely dc home later today. Plan remains for [facility] to follow for RN and PT. Updated Georgina Snell at [place]. No other TOC needs anticipated for dc.   Final next level of care: [place] Barriers to Discharge: Barriers Resolved   Patient Goals and CMS Choice Patient states their goals for this hospitalization and ongoing recovery are:: Go home CMS Medicare.gov Compare Post Acute Care list provided to:: Patient Choice offered to / list presented to : Patient  Discharge Placement                       Discharge Plan and Services   Discharge Planning Services: CM Consult Post Acute Care Choice: Home Health                    HH Arranged: RN, PT [facility] Agency: [place] Date [facility] Agency Contacted: 11/12/18 Time [place]: [phone] Representative spoke with at [place]: [place] Interventions     Readmission Risk Interventions Readmission Risk Prevention Plan 11/16/2018  Post Dischage Appt Complete  Medication Screening Complete  Transportation Screening Complete  Some recent data might be hidden

## 2018-11-17 DIAGNOSIS — I480 Paroxysmal atrial fibrillation: Secondary | ICD-10-CM | POA: Diagnosis not present

## 2018-11-17 DIAGNOSIS — R7303 Prediabetes: Secondary | ICD-10-CM | POA: Diagnosis not present

## 2018-11-17 DIAGNOSIS — I501 Left ventricular failure: Secondary | ICD-10-CM | POA: Diagnosis not present

## 2018-11-17 DIAGNOSIS — Z7901 Long term (current) use of anticoagulants: Secondary | ICD-10-CM | POA: Diagnosis not present

## 2018-11-17 DIAGNOSIS — Z9181 History of falling: Secondary | ICD-10-CM | POA: Diagnosis not present

## 2018-11-17 DIAGNOSIS — F329 Major depressive disorder, single episode, unspecified: Secondary | ICD-10-CM | POA: Diagnosis not present

## 2018-11-17 DIAGNOSIS — I081 Rheumatic disorders of both mitral and tricuspid valves: Secondary | ICD-10-CM | POA: Diagnosis not present

## 2018-11-17 DIAGNOSIS — J449 Chronic obstructive pulmonary disease, unspecified: Secondary | ICD-10-CM | POA: Diagnosis not present

## 2018-11-17 DIAGNOSIS — I0981 Rheumatic heart failure: Secondary | ICD-10-CM | POA: Diagnosis not present

## 2018-11-17 DIAGNOSIS — E785 Hyperlipidemia, unspecified: Secondary | ICD-10-CM | POA: Diagnosis not present

## 2018-11-17 DIAGNOSIS — Z85828 Personal history of other malignant neoplasm of skin: Secondary | ICD-10-CM | POA: Diagnosis not present

## 2018-11-17 DIAGNOSIS — M479 Spondylosis, unspecified: Secondary | ICD-10-CM | POA: Diagnosis not present

## 2018-11-17 DIAGNOSIS — I5031 Acute diastolic (congestive) heart failure: Secondary | ICD-10-CM | POA: Diagnosis not present

## 2018-11-17 DIAGNOSIS — J918 Pleural effusion in other conditions classified elsewhere: Secondary | ICD-10-CM | POA: Diagnosis not present

## 2018-11-17 DIAGNOSIS — Z9981 Dependence on supplemental oxygen: Secondary | ICD-10-CM | POA: Diagnosis not present

## 2018-11-17 DIAGNOSIS — H9190 Unspecified hearing loss, unspecified ear: Secondary | ICD-10-CM | POA: Diagnosis not present

## 2018-11-17 DIAGNOSIS — Z602 Problems related to living alone: Secondary | ICD-10-CM | POA: Diagnosis not present

## 2018-11-17 DIAGNOSIS — I272 Pulmonary hypertension, unspecified: Secondary | ICD-10-CM | POA: Diagnosis not present

## 2018-11-17 DIAGNOSIS — Z86718 Personal history of other venous thrombosis and embolism: Secondary | ICD-10-CM | POA: Diagnosis not present

## 2018-11-17 DIAGNOSIS — I110 Hypertensive heart disease with heart failure: Secondary | ICD-10-CM | POA: Diagnosis not present

## 2018-11-17 DIAGNOSIS — J9610 Chronic respiratory failure, unspecified whether with hypoxia or hypercapnia: Secondary | ICD-10-CM | POA: Diagnosis not present

## 2018-11-17 NOTE — Telephone Encounter (Signed)
Patient's daughter, [name], contacted regarding discharge from [facility] on 11/16/18.  Patient's daughter answered the following questions: Patient understands to follow up with provider Laurine Blazer on 11/26/18 at 2pm at [place]. Patient understands discharge instructions? yes Patient understands medications and regiment? yes Patient understands to bring all medications to this visit? yes

## 2018-11-18 DIAGNOSIS — I5031 Acute diastolic (congestive) heart failure: Secondary | ICD-10-CM | POA: Diagnosis not present

## 2018-11-18 DIAGNOSIS — J918 Pleural effusion in other conditions classified elsewhere: Secondary | ICD-10-CM | POA: Diagnosis not present

## 2018-11-18 DIAGNOSIS — I110 Hypertensive heart disease with heart failure: Secondary | ICD-10-CM | POA: Diagnosis not present

## 2018-11-18 DIAGNOSIS — I0981 Rheumatic heart failure: Secondary | ICD-10-CM | POA: Diagnosis not present

## 2018-11-18 DIAGNOSIS — I501 Left ventricular failure: Secondary | ICD-10-CM | POA: Diagnosis not present

## 2018-11-18 DIAGNOSIS — I480 Paroxysmal atrial fibrillation: Secondary | ICD-10-CM | POA: Diagnosis not present

## 2018-11-19 ENCOUNTER — Encounter: Payer: Self-pay | Admitting: Internal Medicine

## 2018-11-19 ENCOUNTER — Telehealth: Payer: Self-pay | Admitting: Cardiovascular Disease

## 2018-11-19 ENCOUNTER — Ambulatory Visit (INDEPENDENT_AMBULATORY_CARE_PROVIDER_SITE_OTHER): Payer: Medicare Other | Admitting: Internal Medicine

## 2018-11-19 ENCOUNTER — Other Ambulatory Visit: Payer: Self-pay

## 2018-11-19 VITALS — BP 130/63 | HR 71 | Resp 16 | Ht 60.0 in | Wt 119.8 lb

## 2018-11-19 DIAGNOSIS — R0902 Hypoxemia: Secondary | ICD-10-CM

## 2018-11-19 DIAGNOSIS — I4891 Unspecified atrial fibrillation: Secondary | ICD-10-CM | POA: Diagnosis not present

## 2018-11-19 DIAGNOSIS — J449 Chronic obstructive pulmonary disease, unspecified: Secondary | ICD-10-CM

## 2018-11-19 DIAGNOSIS — R0602 Shortness of breath: Secondary | ICD-10-CM | POA: Diagnosis not present

## 2018-11-19 DIAGNOSIS — I6523 Occlusion and stenosis of bilateral carotid arteries: Secondary | ICD-10-CM

## 2018-11-19 DIAGNOSIS — I5031 Acute diastolic (congestive) heart failure: Secondary | ICD-10-CM

## 2018-11-19 DIAGNOSIS — J45909 Unspecified asthma, uncomplicated: Secondary | ICD-10-CM | POA: Diagnosis not present

## 2018-11-19 NOTE — Telephone Encounter (Signed)
Patient daughter wanted to call and give updated information Yesterday evening after dinner and after taking all amiodarone for the day BP was 97/48 with HR 66 Today they are starting the amiodarone 200 MG 2 times daily  After morning dose, BP was 128/71 HR 65 Please call to discuss

## 2018-11-19 NOTE — Progress Notes (Signed)
[facility] [place], [place] [address]  Pulmonary Sleep Medicine   Office Visit Note  Patient Name: Ashley Weiss DOB: [birth year] MRN [id number]  Date of Service: 11/19/2018  Complaints/HPI: She has been having issues with tachycardia being seen by the cardiologist. She has been diagnosed with CHF and also has been cardioverted after eliquis. Heart rate is down but she is on oxygen at this time and this is of concern to the family. She was apparently put on oxygen. Her CXR showed CHF. She also did have an enlarged RA noted. I discussed Atrial Fib and mechanisms with the patient and daughter and they now seem to have a better understanding of the mechanisms involved. Also discussed risk of stroke and also to watch her oxygen levels  ROS  General: (-) fever, (-) chills, (-) night sweats, (-) weakness Skin: (-) rashes, (-) itching,. Eyes: (-) visual changes, (-) redness, (-) itching. Nose and Sinuses: (-) nasal stuffiness or itchiness, (-) postnasal drip, (-) nosebleeds, (-) sinus trouble. Mouth and Throat: (-) sore throat, (-) hoarseness. Neck: (-) swollen glands, (-) enlarged thyroid, (-) neck pain. Respiratory: - cough, (-) bloody sputum, + shortness of breath, - wheezing. Cardiovascular: - ankle swelling, (-) chest pain. Lymphatic: (-) lymph node enlargement. Neurologic: (-) numbness, (-) tingling. Psychiatric: (-) anxiety, (-) depression   Current Medication: Outpatient Encounter Medications as of 11/19/2018  Medication Sig  . albuterol (PROVENTIL HFA;VENTOLIN HFA) 108 (90 Base) MCG/ACT inhaler Inhale 2 puffs into the lungs every 6 (six) hours as needed for wheezing or shortness of breath.  Marland Kitchen amiodarone (PACERONE) 200 MG tablet Take 2 tablets (400 mg total) by mouth 2 (two) times daily. 400 mg BID for 3 days and then 200 mg BID  . apixaban (ELIQUIS) 2.5 MG TABS tablet Take 1 tablet (2.5 mg total) by mouth 2 (two) times daily.  . Blood Glucose  Monitoring Suppl (FREESTYLE FREEDOM LITE) w/Device KIT 1 kit by Does not apply route daily.  Marland Kitchen denosumab (PROLIA) 60 MG/ML SOSY injection Inject 60 mg into the skin every 6 (six) months.  . diltiazem (CARDIZEM CD) 120 MG 24 hr capsule Take 1 capsule (120 mg total) by mouth daily.  . fluticasone (FLONASE) 50 MCG/ACT nasal spray Place 1 spray into both nostrils daily as needed for allergies or rhinitis.  . fluticasone (FLOVENT HFA) 110 MCG/ACT inhaler Inhale 2 puffs into the lungs 2 (two) times daily.  . furosemide (LASIX) 40 MG tablet Take 1 tablet (40 mg total) by mouth daily.  Marland Kitchen glucose blood (FREESTYLE LITE) test strip Use as directed once a daily Diag E11.65  . ipratropium-albuterol (DUONEB) 0.5-2.5 (3) MG/3ML SOLN Take 3 mLs by nebulization 3 (three) times daily as needed. Use 1 vial 3 times daily as needed for SOB dia j45.1  . Lancets (FREESTYLE) lancets Use as directed once daily diag e11.65  . levothyroxine (SYNTHROID) 25 MCG tablet TAKE 1 DAILY BY MOUTH IN THE MORNING ON AN EMPTY STOMACH  . metoprolol tartrate (LOPRESSOR) 100 MG tablet Take 1.5 tablets twice daily. (Patient taking differently: Take 150 mg by mouth 2 (two) times daily. Take 1.5 tablets twice daily. )  . mometasone (ELOCON) 0.1 % lotion Apply 4 drops topically at bedtime as needed for itching.  . montelukast (SINGULAIR) 10 MG tablet TAKE 1 TABLET BY MOUTH EVERY DAY FOR COUGH (Patient taking differently: Take 10 mg by mouth daily. TAKE 1 TABLET BY MOUTH EVERY DAY FOR COUGH)  . Multiple Vitamins-Minerals (MULTIVITAMIN WITH MINERALS)  tablet Take 1 tablet by mouth daily.  . OXYGEN Inhale 2 L into the lungs every evening. Sleep with nasal cannula w/ O2 at 2lpm.  . potassium chloride SA (K-DUR) 10 MEQ tablet Take 1 tablet (10 mEq total) by mouth daily.  . rosuvastatin (CRESTOR) 5 MG tablet Take 1 tablet (5 mg total) by mouth daily at 6 PM.  . sertraline (ZOLOFT) 100 MG tablet Take 1 tablet (100 mg total) by mouth daily.  . vitamin  C (ASCORBIC ACID) 500 MG tablet Take 500 mg by mouth daily.   No facility-administered encounter medications on file as of 11/19/2018.     Surgical History: Past Surgical History:  Procedure Laterality Date  . BREAST CYST ASPIRATION Left 20+ yrs ago  . CARDIOVERSION N/A 11/11/2018   Procedure: CARDIOVERSION;  Surgeon: Nelva Bush, MD;  Location: [facility] ORS;  Service: Cardiovascular;  Laterality: N/A;  . CATARACT EXTRACTION Bilateral [id number]  . cyst removal-right shoulder  1972  . otosclerosis Left 1988   hardening of bone, other 1989 redone  . THYROIDECTOMY  1970   nodule and 1/2 bridge removal  . WRIST SURGERY Left 11/29/2009    Medical History: Past Medical History:  Diagnosis Date  . Arthritis   . Asthma   . Cancer ([place])    skin  . Depression   . DVT (deep venous thrombosis) ([place])   . Hyperlipidemia   . Hypertension   . Phlebitis     Family History: Family History  Problem Relation Age of Onset  . Osteoarthritis Mother   . Depression Father   . Breast cancer Neg Hx     Social History: Social History   Socioeconomic History  . Marital status: Widowed    Spouse name: Not on file  . Number of children: Not on file  . Years of education: Not on file  . Highest education level: Not on file  Occupational History  . Not on file  Social Needs  . Financial resource strain: Not on file  . Food insecurity    Worry: Not on file    Inability: Not on file  . Transportation needs    Medical: Not on file    Non-medical: Not on file  Tobacco Use  . Smoking status: Never Smoker  . Smokeless tobacco: Never Used  Substance and Sexual Activity  . Alcohol use: No  . Drug use: No  . Sexual activity: Never  Lifestyle  . Physical activity    Days per week: Not on file    Minutes per session: Not on file  . Stress: Not on file  Relationships  . Social Herbalist on phone: Not on file    Gets together: Not on file    Attends religious service: Not on  file    Active member of club or organization: Not on file    Attends meetings of clubs or organizations: Not on file    Relationship status: Not on file  . Intimate partner violence    Fear of current or ex partner: Not on file    Emotionally abused: Not on file    Physically abused: Not on file    Forced sexual activity: Not on file  Other Topics Concern  . Not on file  Social History Narrative  . Not on file    Vital Signs: Blood pressure 130/63, pulse 71, resp. rate 16, height 5' (1.524 m), weight 119 lb 12.8 oz (54.3 kg), SpO2 (!) 88 %.  Examination:  General Appearance: The patient is well-developed, well-nourished, and in no distress. Skin: Gross inspection of skin unremarkable. Head: normocephalic, no gross deformities. Eyes: no gross deformities noted. ENT: ears appear grossly normal no exudates. Neck: Supple. No thyromegaly. No LAD. Respiratory: no rhnchih noted. Cardiovascular: Normal S1 and S2 without murmur or rub. Extremities: No cyanosis. pulses are equal. Neurologic: Alert and oriented. No involuntary movements.  LABS: Recent Results (from the past 2160 hour(s))  POCT HgB A1C     Status: Abnormal   Collection Time: 09/30/18 10:33 AM  Result Value Ref Range   Hemoglobin A1C 5.9 (A) 4.0 - 5.6 %   HbA1c POC (<> result, manual entry)     HbA1c, POC (prediabetic range)     HbA1c, POC (controlled diabetic range)    Digoxin level     Status: Abnormal   Collection Time: 10/09/18  8:40 AM  Result Value Ref Range   Digoxin, Serum 1.0 (H) 0.5 - 0.9 ng/mL    Comment: Concentrations above 2.0 ng/mL are generally considered toxic. Some overlap of toxic and non-toxic values have been reported.                             Detection Limit = 0.4 ng/mL Therapeutic range is derived from 2013 ACCF/AHA Guidelines for the Management of Heart Failure.   T4, free     Status: None   Collection Time: 10/09/18  8:40 AM  Result Value Ref Range   Free T4 1.22 0.82 - 1.77 ng/dL   CBC with Differential/Platelet     Status: None   Collection Time: 10/09/18  8:47 AM  Result Value Ref Range   WBC 8.1 3.4 - 10.8 x10E3/uL   RBC 4.26 3.77 - 5.28 x10E6/uL   Hemoglobin 13.8 11.1 - 15.9 g/dL   Hematocrit 41.1 34.0 - 46.6 %   MCV 97 79 - 97 fL   MCH 32.4 26.6 - 33.0 pg   MCHC 33.6 31.5 - 35.7 g/dL   RDW 12.2 11.7 - 15.4 %   Platelets 225 150 - 450 x10E3/uL   Neutrophils 66 Not Estab. %   Lymphs 20 Not Estab. %   Monocytes 8 Not Estab. %   Eos 5 Not Estab. %   Basos 1 Not Estab. %   Neutrophils Absolute 5.3 1.4 - 7.0 x10E3/uL   Lymphocytes Absolute 1.6 0.7 - 3.1 x10E3/uL   Monocytes Absolute 0.7 0.1 - 0.9 x10E3/uL   EOS (ABSOLUTE) 0.4 0.0 - 0.4 x10E3/uL   Basophils Absolute 0.1 0.0 - 0.2 x10E3/uL   Immature Granulocytes 0 Not Estab. %   Immature Grans (Abs) 0.0 0.0 - 0.1 x10E3/uL  TSH     Status: Abnormal   Collection Time: 10/09/18  8:48 AM  Result Value Ref Range   TSH 4.660 (H) 0.450 - 4.500 uIU/mL  Lipid Panel With LDL/HDL Ratio     Status: Abnormal   Collection Time: 10/09/18  8:48 AM  Result Value Ref Range   Cholesterol, Total 119 100 - 199 mg/dL   Triglycerides 121 0 - 149 mg/dL   HDL 38 (L) >39 mg/dL   VLDL Cholesterol Cal 24 5 - 40 mg/dL   LDL Calculated 57 0 - 99 mg/dL   LDl/HDL Ratio 1.5 0.0 - 3.2 ratio    Comment:  LDL/HDL Ratio                                             Men  Women                               1/2 Avg.Risk  1.0    1.5                                   Avg.Risk  3.6    3.2                                2X Avg.Risk  6.2    5.0                                3X Avg.Risk  8.0    6.1   CBC     Status: None   Collection Time: 10/10/18 10:58 AM  Result Value Ref Range   WBC 7.7 4.0 - 10.5 K/uL   RBC 4.19 3.87 - 5.11 MIL/uL   Hemoglobin 13.3 12.0 - 15.0 g/dL   HCT 41.2 36.0 - 46.0 %   MCV 98.3 80.0 - 100.0 fL   MCH 31.7 26.0 - 34.0 pg   MCHC 32.3 30.0 - 36.0 g/dL   RDW 13.7 11.5 - 15.5 %    Platelets 213 150 - 400 K/uL   nRBC 0.0 0.0 - 0.2 %    Comment: Performed at [facility], [address] [place], [place] [address]  Troponin I - ONCE - STAT     Status: Abnormal   Collection Time: 10/10/18 10:58 AM  Result Value Ref Range   Troponin I 0.04 (HH) <0.03 ng/mL    Comment: CRITICAL RESULT CALLED TO, READ BACK BY AND VERIFIED WITH KIM GAULT '@1254'$  10/10/18 AKT Performed at [facility], [address] [place], [place] [address]   Comprehensive metabolic panel     Status: Abnormal   Collection Time: 10/10/18 10:58 AM  Result Value Ref Range   Sodium 138 135 - 145 mmol/L   Potassium 4.3 3.5 - 5.1 mmol/L   Chloride 99 98 - 111 mmol/L   CO2 31 22 - 32 mmol/L   Glucose, Bld 176 (H) 70 - 99 mg/dL   BUN 17 8 - 23 mg/dL   Creatinine, Ser 0.48 0.44 - 1.00 mg/dL   Calcium 8.8 (L) 8.9 - 10.3 mg/dL   Total Protein 7.2 6.5 - 8.1 g/dL   Albumin 3.2 (L) 3.5 - 5.0 g/dL   AST 20 15 - 41 U/L   ALT 21 0 - 44 U/L   Alkaline Phosphatase 52 38 - 126 U/L   Total Bilirubin 0.7 0.3 - 1.2 mg/dL   GFR calc non Af Amer >60 >60 mL/min   GFR calc Af Amer >60 >60 mL/min   Anion gap 8 5 - 15    Comment: Performed at [facility], [address] [place], [place] [address]  Digoxin level     Status: Abnormal   Collection Time: 10/10/18 11:13 AM  Result Value Ref Range   Digoxin Level 0.7 (  L) 0.8 - 2.0 ng/mL    Comment: Performed at [facility], [address] [place], [place] [address]  Troponin I - ONCE - STAT     Status: Abnormal   Collection Time: 10/10/18  1:24 PM  Result Value Ref Range   Troponin I 0.03 (HH) <0.03 ng/mL    Comment: CRITICAL VALUE NOTED. VALUE IS CONSISTENT WITH PREVIOUSLY REPORTED/CALLED VALUE.QSD Performed at [facility], [address] [place], [place] [address]   SARS Coronavirus 2 (CEPHEID - Performed in [facility] hospital lab), Hosp Order     Status: None   Collection Time: 10/10/18  2:49 PM   Specimen:  Nasopharyngeal Swab  Result Value Ref Range   SARS Coronavirus 2 NEGATIVE NEGATIVE    Comment: (NOTE) If result is NEGATIVE SARS-CoV-2 target nucleic acids are NOT DETECTED. The SARS-CoV-2 RNA is generally detectable in upper and lower  respiratory specimens during the acute phase of infection. The lowest  concentration of SARS-CoV-2 viral copies this assay can detect is 250  copies / mL. A negative result does not preclude SARS-CoV-2 infection  and should not be used as the sole basis for treatment or other  patient management decisions.  A negative result may occur with  improper specimen collection / handling, submission of specimen other  than nasopharyngeal swab, presence of viral mutation(s) within the  areas targeted by this assay, and inadequate number of viral copies  (<250 copies / mL). A negative result must be combined with clinical  observations, patient history, and epidemiological information. If result is POSITIVE SARS-CoV-2 target nucleic acids are DETECTED. The SARS-CoV-2 RNA is generally detectable in upper and lower  respiratory specimens dur ing the acute phase of infection.  Positive  results are indicative of active infection with SARS-CoV-2.  Clinical  correlation with patient history and other diagnostic information is  necessary to determine patient infection status.  Positive results do  not rule out bacterial infection or co-infection with other viruses. If result is PRESUMPTIVE POSTIVE SARS-CoV-2 nucleic acids MAY BE PRESENT.   A presumptive positive result was obtained on the submitted specimen  and confirmed on repeat testing.  While 2019 novel coronavirus  (SARS-CoV-2) nucleic acids may be present in the submitted sample  additional confirmatory testing may be necessary for epidemiological  and / or clinical management purposes  to differentiate between  SARS-CoV-2 and other Sarbecovirus currently known to infect humans.  If clinically indicated  additional testing with an alternate test  methodology [phone]) is advised. The SARS-CoV-2 RNA is generally  detectable in upper and lower respiratory sp ecimens during the acute  phase of infection. The expected result is Negative. Fact Sheet for Patients:  StrictlyIdeas.no Fact Sheet for Healthcare Providers: BankingDealers.co.za This test is not yet approved or cleared by the [place] FDA and has been authorized for detection and/or diagnosis of SARS-CoV-2 by FDA under an Emergency Use Authorization (EUA).  This EUA will remain in effect (meaning this test can be used) for the duration of the COVID-19 declaration under Section 564(b)(1) of the Act, 21 U.S.C. section 360bbb-3(b)(1), unless the authorization is terminated or revoked sooner. Performed at [facility], [address] [place], [place] [address]   CBC with Differential/Platelet     Status: Abnormal   Collection Time: 11/05/18 11:36 AM  Result Value Ref Range   WBC 8.8 3.4 - 10.8 x10E3/uL   RBC 3.79 3.77 - 5.28 x10E6/uL   Hemoglobin 12.2 11.1 - 15.9 g/dL  Hematocrit 37.6 34.0 - 46.6 %   MCV 99 (H) 79 - 97 fL   MCH 32.2 26.6 - 33.0 pg   MCHC 32.4 31.5 - 35.7 g/dL   RDW 12.4 11.7 - 15.4 %   Platelets 228 150 - 450 x10E3/uL   Neutrophils 72 Not Estab. %   Lymphs 15 Not Estab. %   Monocytes 9 Not Estab. %   Eos 3 Not Estab. %   Basos 1 Not Estab. %   Neutrophils Absolute 6.4 1.4 - 7.0 x10E3/uL   Lymphocytes Absolute 1.3 0.7 - 3.1 x10E3/uL   Monocytes Absolute 0.8 0.1 - 0.9 x10E3/uL   EOS (ABSOLUTE) 0.3 0.0 - 0.4 x10E3/uL   Basophils Absolute 0.1 0.0 - 0.2 x10E3/uL   Immature Granulocytes 0 Not Estab. %   Immature Grans (Abs) 0.0 0.0 - 0.1 [id number]  Basic metabolic panel     Status: Abnormal   Collection Time: 11/05/18 11:36 AM  Result Value Ref Range   Glucose 143 (H) 65 - 99 mg/dL   BUN 15 10 - 36 mg/dL   Creatinine, Ser 0.50 (L) 0.57 - 1.00  mg/dL   GFR calc non Af Amer 82 >59 mL/min/1.73   GFR calc Af Amer 94 >59 mL/min/1.73   BUN/Creatinine Ratio 30 (H) 12 - 28   Sodium 136 134 - 144 mmol/L   Potassium 4.5 3.5 - 5.2 mmol/L   Chloride 94 (L) 96 - 106 mmol/L   CO2 25 20 - 29 mmol/L   Calcium 9.1 8.7 - 10.3 mg/dL  CBC with Differential/Platelet     Status: Abnormal   Collection Time: 11/10/18  5:46 PM  Result Value Ref Range   WBC 9.2 4.0 - 10.5 K/uL   RBC 3.71 (L) 3.87 - 5.11 MIL/uL   Hemoglobin 11.9 (L) 12.0 - 15.0 g/dL   HCT 37.0 36.0 - 46.0 %   MCV 99.7 80.0 - 100.0 fL   MCH 32.1 26.0 - 34.0 pg   MCHC 32.2 30.0 - 36.0 g/dL   RDW 14.0 11.5 - 15.5 %   Platelets 227 150 - 400 K/uL   nRBC 0.0 0.0 - 0.2 %   Neutrophils Relative % 70 %   Neutro Abs 6.4 1.7 - 7.7 K/uL   Lymphocytes Relative 17 %   Lymphs Abs 1.6 0.7 - 4.0 K/uL   Monocytes Relative 8 %   Monocytes Absolute 0.8 0.1 - 1.0 K/uL   Eosinophils Relative 4 %   Eosinophils Absolute 0.4 0.0 - 0.5 K/uL   Basophils Relative 1 %   Basophils Absolute 0.1 0.0 - 0.1 K/uL   Immature Granulocytes 0 %   Abs Immature Granulocytes 0.03 0.00 - 0.07 K/uL    Comment: Performed at [facility], [address] [place], [place] [address]  Comprehensive metabolic panel     Status: Abnormal   Collection Time: 11/10/18  5:46 PM  Result Value Ref Range   Sodium 133 (L) 135 - 145 mmol/L   Potassium 4.5 3.5 - 5.1 mmol/L   Chloride 95 (L) 98 - 111 mmol/L   CO2 27 22 - 32 mmol/L   Glucose, Bld 142 (H) 70 - 99 mg/dL   BUN 19 8 - 23 mg/dL   Creatinine, Ser 0.55 0.44 - 1.00 mg/dL   Calcium 8.9 8.9 - 10.3 mg/dL   Total Protein 7.9 6.5 - 8.1 g/dL   Albumin 3.5 3.5 - 5.0 g/dL   AST 47 (H) 15 - 41 U/L   ALT 56 (H)  0 - 44 U/L   Alkaline Phosphatase 69 38 - 126 U/L   Total Bilirubin 0.7 0.3 - 1.2 mg/dL   GFR calc non Af Amer >60 >60 mL/min   GFR calc Af Amer >60 >60 mL/min   Anion gap 11 5 - 15    Comment: Performed at [facility], [address]  [place], [place] [address]  Magnesium     Status: None   Collection Time: 11/10/18  5:46 PM  Result Value Ref Range   Magnesium 2.1 1.7 - 2.4 mg/dL    Comment: Performed at [facility], [address] [place], [place] [address]  Brain natriuretic peptide     Status: Abnormal   Collection Time: 11/10/18  5:46 PM  Result Value Ref Range   B Natriuretic Peptide 312.0 (H) 0.0 - 100.0 pg/mL    Comment: Performed at [facility], [address] [place], [place] [address]  Procalcitonin     Status: None   Collection Time: 11/10/18  5:46 PM  Result Value Ref Range   Procalcitonin <0.10 ng/mL    Comment:        Interpretation: PCT (Procalcitonin) <= 0.5 ng/mL: Systemic infection (sepsis) is not likely. Local bacterial infection is possible. (NOTE)       Sepsis PCT Algorithm           Lower Respiratory Tract                                      Infection PCT Algorithm    ----------------------------     ----------------------------         PCT < 0.25 ng/mL                PCT < 0.10 ng/mL         Strongly encourage             Strongly discourage   discontinuation of antibiotics    initiation of antibiotics    ----------------------------     -----------------------------       PCT 0.25 - 0.50 ng/mL            PCT 0.10 - 0.25 ng/mL               OR       >80% decrease in PCT            Discourage initiation of                                            antibiotics      Encourage discontinuation           of antibiotics    ----------------------------     -----------------------------         PCT >= 0.50 ng/mL              PCT 0.26 - 0.50 ng/mL               AND        <80% decrease in PCT             Encourage initiation of  antibiotics       Encourage continuation           of antibiotics    ----------------------------     -----------------------------        PCT >= 0.50 ng/mL                  PCT > 0.50 ng/mL                AND         increase in PCT                  Strongly encourage                                      initiation of antibiotics    Strongly encourage escalation           of antibiotics                                     -----------------------------                                           PCT <= 0.25 ng/mL                                                 OR                                        > 80% decrease in PCT                                     Discontinue / Do not initiate                                             antibiotics Performed at [facility], [address] [place], [place] [address]   SARS Coronavirus 2 (CEPHEID- Performed in [place] hospital lab), Hosp Order     Status: None   Collection Time: 11/10/18  5:47 PM   Specimen: Nasopharyngeal Swab  Result Value Ref Range   SARS Coronavirus 2 NEGATIVE NEGATIVE    Comment: (NOTE) If result is NEGATIVE SARS-CoV-2 target nucleic acids are NOT DETECTED. The SARS-CoV-2 RNA is generally detectable in upper and lower  respiratory specimens during the acute phase of infection. The lowest  concentration of SARS-CoV-2 viral copies this assay can detect is 250  copies / mL. A negative result does not preclude SARS-CoV-2 infection  and should not be used as the sole basis for treatment or other  patient management decisions.  A negative result may occur with  improper specimen collection / handling, submission of specimen other  than nasopharyngeal swab, presence of viral mutation(s) within the  areas targeted by this assay, and inadequate number  of viral copies  (<250 copies / mL). A negative result must be combined with clinical  observations, patient history, and epidemiological information. If result is POSITIVE SARS-CoV-2 target nucleic acids are DETECTED. The SARS-CoV-2 RNA is generally detectable in upper and lower  respiratory specimens dur ing the acute phase of infection.  Positive  results are  indicative of active infection with SARS-CoV-2.  Clinical  correlation with patient history and other diagnostic information is  necessary to determine patient infection status.  Positive results do  not rule out bacterial infection or co-infection with other viruses. If result is PRESUMPTIVE POSTIVE SARS-CoV-2 nucleic acids MAY BE PRESENT.   A presumptive positive result was obtained on the submitted specimen  and confirmed on repeat testing.  While 2019 novel coronavirus  (SARS-CoV-2) nucleic acids may be present in the submitted sample  additional confirmatory testing may be necessary for epidemiological  and / or clinical management purposes  to differentiate between  SARS-CoV-2 and other Sarbecovirus currently known to infect humans.  If clinically indicated additional testing with an alternate test  methodology [phone]) is advised. The SARS-CoV-2 RNA is generally  detectable in upper and lower respiratory sp ecimens during the acute  phase of infection. The expected result is Negative. Fact Sheet for Patients:  StrictlyIdeas.no Fact Sheet for Healthcare Providers: BankingDealers.co.za This test is not yet approved or cleared by the [place] FDA and has been authorized for detection and/or diagnosis of SARS-CoV-2 by FDA under an Emergency Use Authorization (EUA).  This EUA will remain in effect (meaning this test can be used) for the duration of the COVID-19 declaration under Section 564(b)(1) of the Act, 21 U.S.C. section 360bbb-3(b)(1), unless the authorization is terminated or revoked sooner. Performed at [facility], [address] [place], [place] [address]   Blood Culture (routine x 2)     Status: None   Collection Time: 11/10/18  6:33 PM   Specimen: BLOOD  Result Value Ref Range   Specimen Description BLOOD RIGHT ANTECUBITAL    Special Requests      BOTTLES DRAWN AEROBIC AND ANAEROBIC Blood Culture  results may not be optimal due to an inadequate volume of blood received in culture bottles   Culture      NO GROWTH 5 DAYS Performed at [facility], [address], [place], [place] [address]    Report Status 11/15/2018 FINAL   Blood Culture (routine x 2)     Status: Abnormal   Collection Time: 11/10/18  6:33 PM   Specimen: BLOOD  Result Value Ref Range   Specimen Description      BLOOD LEFT ANTECUBITAL Performed at [facility], [address], [place], [place] [address]    Special Requests      BOTTLES DRAWN AEROBIC AND ANAEROBIC Blood Culture adequate volume Performed at [facility], [address] [place], [place] [address]    Culture  Setup Time      Organism ID to follow [place] CRITICAL RESULT CALLED TO, READ BACK BY AND VERIFIED WITH:  SCOTT HALL ON 11/11/2018 AT 10:22. TIK Performed at [facility], [place], [place] [address]    Culture (A)     STAPHYLOCOCCUS SPECIES (COAGULASE NEGATIVE) THE SIGNIFICANCE OF ISOLATING THIS ORGANISM FROM A SINGLE SET OF BLOOD CULTURES WHEN MULTIPLE SETS ARE DRAWN IS UNCERTAIN. PLEASE NOTIFY THE MICROBIOLOGY DEPARTMENT WITHIN ONE WEEK IF SPECIATION AND SENSITIVITIES ARE REQUIRED. Performed at [facility]  [facility], [place] [address], [place], [place] [address]    Report Status 11/16/2018 FINAL   Urine culture     Status: Abnormal   Collection Time: 11/10/18  6:33 PM   Specimen: Urine, Random  Result Value Ref Range   Specimen Description      URINE, RANDOM Performed at [facility], [address] [place], [place] [address]    Special Requests      NONE Performed at [facility], [address] [place], [place] [address]    Culture MULTIPLE SPECIES PRESENT, SUGGEST RECOLLECTION (A)    Report Status 11/12/2018 FINAL   Urinalysis, Routine w reflex microscopic     Status: Abnormal   Collection Time: 11/10/18   6:33 PM  Result Value Ref Range   Color, Urine AMBER (A) YELLOW    Comment: BIOCHEMICALS MAY BE AFFECTED BY COLOR   APPearance CLOUDY (A) CLEAR   Specific Gravity, Urine 1.019 1.005 - 1.030   pH 6.0 5.0 - 8.0   Glucose, UA NEGATIVE NEGATIVE mg/dL   Hgb urine dipstick NEGATIVE NEGATIVE   Bilirubin Urine NEGATIVE NEGATIVE   Ketones, ur NEGATIVE NEGATIVE mg/dL   Protein, ur NEGATIVE NEGATIVE mg/dL   Nitrite NEGATIVE NEGATIVE   Leukocytes,Ua MODERATE (A) NEGATIVE   RBC / HPF 6-10 0 - 5 RBC/hpf   WBC, UA 21-50 0 - 5 WBC/hpf   Bacteria, UA NONE SEEN NONE SEEN   Squamous Epithelial / LPF 0-5 0 - 5   Mucus PRESENT     Comment: Performed at [facility], [address] [place], [place] [address]  Blood Culture ID Panel (Reflexed)     Status: Abnormal   Collection Time: 11/10/18  6:33 PM  Result Value Ref Range   Enterococcus species NOT DETECTED NOT DETECTED   Listeria monocytogenes NOT DETECTED NOT DETECTED   Staphylococcus species DETECTED (A) NOT DETECTED    Comment: Methicillin (oxacillin) susceptible coagulase negative staphylococcus. Possible blood culture contaminant (unless isolated from more than one blood culture draw or clinical case suggests pathogenicity). No antibiotic treatment is indicated for blood  culture contaminants. CRITICAL RESULT CALLED TO, READ BACK BY AND VERIFIED WITH:  SCOTT HALL ON 11/11/2018 AT 10:22. TIK    Staphylococcus aureus (BCID) NOT DETECTED NOT DETECTED   Methicillin resistance NOT DETECTED NOT DETECTED   Streptococcus species NOT DETECTED NOT DETECTED   Streptococcus agalactiae NOT DETECTED NOT DETECTED   Streptococcus pneumoniae NOT DETECTED NOT DETECTED   Streptococcus pyogenes NOT DETECTED NOT DETECTED   Acinetobacter baumannii NOT DETECTED NOT DETECTED   Enterobacteriaceae species NOT DETECTED NOT DETECTED   Enterobacter cloacae complex NOT DETECTED NOT DETECTED   Escherichia coli NOT DETECTED NOT DETECTED   Klebsiella oxytoca NOT  DETECTED NOT DETECTED   Klebsiella pneumoniae NOT DETECTED NOT DETECTED   Proteus species NOT DETECTED NOT DETECTED   Serratia marcescens NOT DETECTED NOT DETECTED   Haemophilus influenzae NOT DETECTED NOT DETECTED   Neisseria meningitidis NOT DETECTED NOT DETECTED   Pseudomonas aeruginosa NOT DETECTED NOT DETECTED   Candida albicans NOT DETECTED NOT DETECTED   Candida glabrata NOT DETECTED NOT DETECTED   Candida krusei NOT DETECTED NOT DETECTED   Candida parapsilosis NOT DETECTED NOT DETECTED   Candida tropicalis NOT DETECTED NOT DETECTED    Comment: Performed at [facility], [address] [place],  [address]  Lactic acid, plasma     Status: None   Collection Time: 11/11/18 12:10 AM  Result Value Ref Range  Lactic Acid, Venous 1.3 0.5 - 1.9 mmol/L    Comment: Performed at [facility], [address] [place], [place] [address]  TSH     Status: None   Collection Time: 11/11/18 12:10 AM  Result Value Ref Range   TSH 4.231 0.350 - 4.500 uIU/mL    Comment: Performed by a 3rd Generation assay with a functional sensitivity of <=0.01 uIU/mL. Performed at [facility], [address] [place], [place] [address]   Brain natriuretic peptide     Status: Abnormal   Collection Time: 11/11/18 12:10 AM  Result Value Ref Range   B Natriuretic Peptide 283.0 (H) 0.0 - 100.0 pg/mL    Comment: Performed at [facility], [place], [place] [address]  Troponin I (High Sensitivity)     Status: None   Collection Time: 11/11/18 12:10 AM  Result Value Ref Range   Troponin I (High Sensitivity) 5 <18 ng/L    Comment: (NOTE) Elevated high sensitivity troponin I (hsTnI) values and significant  changes across serial measurements may suggest ACS but many other  chronic and acute conditions are known to elevate hsTnI results.  Refer to the "Links" section for chest pain algorithms and additional  guidance. Performed at [facility],  [address] [place], [place] [address]   Glucose, capillary     Status: Abnormal   Collection Time: 11/11/18  1:43 AM  Result Value Ref Range   Glucose-Capillary 120 (H) 70 - 99 mg/dL  Lipid panel     Status: Abnormal   Collection Time: 11/11/18  3:17 AM  Result Value Ref Range   Cholesterol 79 0 - 200 mg/dL   Triglycerides 34 <150 mg/dL   HDL 30 (L) >40 mg/dL   Total CHOL/HDL Ratio 2.6 RATIO   VLDL 7 0 - 40 mg/dL   LDL Cholesterol 42 0 - 99 mg/dL    Comment:        Total Cholesterol/HDL:CHD Risk Coronary Heart Disease Risk Table                     Men   Women  1/2 Average Risk   3.4   3.3  Average Risk       5.0   4.4  2 X Average Risk   9.6   7.1  3 X Average Risk  23.4   11.0        Use the calculated Patient Ratio above and the CHD Risk Table to determine the patient's CHD Risk.        ATP III CLASSIFICATION (LDL):  <100     mg/dL   Optimal  100-129  mg/dL   Near or Above                    Optimal  130-159  mg/dL   Borderline  160-189  mg/dL   High  >190     mg/dL   Very High Performed at [facility], [address] [place], [place] [address]   CBC     Status: Abnormal   Collection Time: 11/11/18  3:17 AM  Result Value Ref Range   WBC 9.3 4.0 - 10.5 K/uL   RBC 3.52 (L) 3.87 - 5.11 MIL/uL   Hemoglobin 11.4 (L) 12.0 - 15.0 g/dL   HCT 35.5 (L) 36.0 - 46.0 %   MCV 100.9 (H) 80.0 - 100.0 fL   MCH 32.4 26.0 - 34.0 pg   MCHC  32.1 30.0 - 36.0 g/dL   RDW 13.9 11.5 - 15.5 %   Platelets 195 150 - 400 K/uL   nRBC 0.0 0.0 - 0.2 %    Comment: Performed at [facility], [address] [place], [place] [address]  Basic metabolic panel     Status: Abnormal   Collection Time: 11/11/18  3:17 AM  Result Value Ref Range   Sodium 136 135 - 145 mmol/L   Potassium 4.2 3.5 - 5.1 mmol/L   Chloride 98 98 - 111 mmol/L   CO2 31 22 - 32 mmol/L   Glucose, Bld 136 (H) 70 - 99 mg/dL   BUN 16 8 - 23 mg/dL   Creatinine, Ser 0.56 0.44 - 1.00 mg/dL   Calcium  8.5 (L) 8.9 - 10.3 mg/dL   GFR calc non Af Amer >60 >60 mL/min   GFR calc Af Amer >60 >60 mL/min   Anion gap 7 5 - 15    Comment: Performed at [facility], [address] [place], [place] [address]  Protime-INR     Status: Abnormal   Collection Time: 11/11/18  3:17 AM  Result Value Ref Range   Prothrombin Time 18.7 (H) 11.4 - 15.2 seconds   INR 1.6 (H) 0.8 - 1.2    Comment: (NOTE) INR goal varies based on device and disease states. Performed at [facility], [address] [place], [place] [address]   Lactic acid, plasma     Status: None   Collection Time: 11/11/18  3:17 AM  Result Value Ref Range   Lactic Acid, Venous 0.8 0.5 - 1.9 mmol/L    Comment: Performed at [facility], [place], [place] [address]  Troponin I (High Sensitivity)     Status: None   Collection Time: 11/11/18  3:17 AM  Result Value Ref Range   Troponin I (High Sensitivity) 5 <18 ng/L    Comment: (NOTE) Elevated high sensitivity troponin I (hsTnI) values and significant  changes across serial measurements may suggest ACS but many other  chronic and acute conditions are known to elevate hsTnI results.  Refer to the "Links" section for chest pain algorithms and additional  guidance. Performed at [facility], [address] [place], [place] [address]   ECHOCARDIOGRAM COMPLETE     Status: None   Collection Time: 11/11/18  2:24 PM  Result Value Ref Range   Weight 2,091.72 oz   Height 62 in   BP 123/67 mmHg  Basic metabolic panel     Status: Abnormal   Collection Time: 11/12/18 12:40 PM  Result Value Ref Range   Sodium 138 135 - 145 mmol/L   Potassium 3.6 3.5 - 5.1 mmol/L   Chloride 94 (L) 98 - 111 mmol/L   CO2 35 (H) 22 - 32 mmol/L   Glucose, Bld 116 (H) 70 - 99 mg/dL   BUN 12 8 - 23 mg/dL   Creatinine, Ser 0.47 0.44 - 1.00 mg/dL   Calcium 8.9 8.9 - 10.3 mg/dL   GFR calc non Af Amer >60 >60 mL/min   GFR calc Af Amer >60 >60 mL/min   Anion  gap 9 5 - 15    Comment: Performed at [facility], [address] [place], [place] [address]  CBC     Status: Abnormal   Collection Time: 11/13/18  6:02 AM  Result Value Ref Range   WBC 10.9 (H) 4.0 - 10.5 K/uL   RBC 4.09 3.87 - 5.11  MIL/uL   Hemoglobin 13.3 12.0 - 15.0 g/dL   HCT 40.4 36.0 - 46.0 %   MCV 98.8 80.0 - 100.0 fL   MCH 32.5 26.0 - 34.0 pg   MCHC 32.9 30.0 - 36.0 g/dL   RDW 13.6 11.5 - 15.5 %   Platelets 219 150 - 400 K/uL   nRBC 0.0 0.0 - 0.2 %    Comment: Performed at [facility], [address] [place], [place] [address]  Basic metabolic panel     Status: Abnormal   Collection Time: 11/13/18  6:02 AM  Result Value Ref Range   Sodium 135 135 - 145 mmol/L   Potassium 3.5 3.5 - 5.1 mmol/L   Chloride 91 (L) 98 - 111 mmol/L   CO2 33 (H) 22 - 32 mmol/L   Glucose, Bld 150 (H) 70 - 99 mg/dL   BUN 10 8 - 23 mg/dL   Creatinine, Ser 0.40 (L) 0.44 - 1.00 mg/dL   Calcium 8.6 (L) 8.9 - 10.3 mg/dL   GFR calc non Af Amer >60 >60 mL/min   GFR calc Af Amer >60 >60 mL/min   Anion gap 11 5 - 15    Comment: Performed at [facility], [address] [place], [place] [address]  Vitamin B12     Status: Abnormal   Collection Time: 11/13/18  6:02 AM  Result Value Ref Range   Vitamin B-12 1,164 (H) 180 - 914 pg/mL    Comment: (NOTE) This assay is not validated for testing neonatal or myeloproliferative syndrome specimens for Vitamin B12 levels. Performed at [facility], [place] [address], [place], [place] [address]   Folate     Status: None   Collection Time: 11/13/18  6:02 AM  Result Value Ref Range   Folate 34.0 >5.9 ng/mL    Comment: Performed at [facility], [address] [place], [place] [address]  Basic metabolic panel     Status: Abnormal   Collection Time: 11/14/18  5:12 AM  Result Value Ref Range   Sodium 133 (L) 135 - 145 mmol/L   Potassium 3.4 (L) 3.5 - 5.1 mmol/L   Chloride 92 (L) 98 - 111 mmol/L   CO2 34 (H) 22 -  32 mmol/L   Glucose, Bld 156 (H) 70 - 99 mg/dL   BUN 21 8 - 23 mg/dL   Creatinine, Ser 0.47 0.44 - 1.00 mg/dL   Calcium 8.4 (L) 8.9 - 10.3 mg/dL   GFR calc non Af Amer >60 >60 mL/min   GFR calc Af Amer >60 >60 mL/min   Anion gap 7 5 - 15    Comment: Performed at [facility], [address] [place], [place] [address]  CBC     Status: Abnormal   Collection Time: 11/14/18  5:12 AM  Result Value Ref Range   WBC 11.0 (H) 4.0 - 10.5 K/uL   RBC 3.92 3.87 - 5.11 MIL/uL   Hemoglobin 12.6 12.0 - 15.0 g/dL   HCT 37.7 36.0 - 46.0 %   MCV 96.2 80.0 - 100.0 fL   MCH 32.1 26.0 - 34.0 pg   MCHC 33.4 30.0 - 36.0 g/dL   RDW 13.7 11.5 - 15.5 %   Platelets 218 150 - 400 K/uL   nRBC 0.0 0.0 - 0.2 %    Comment: Performed at [facility], [address], [place], [place] [address]  Basic metabolic panel     Status: Abnormal   Collection Time: 11/15/18  4:12 AM  Result Value Ref Range   Sodium 133 (L) 135 - 145 mmol/L   Potassium 3.0 (L) 3.5 - 5.1 mmol/L   Chloride 92 (L) 98 - 111 mmol/L   CO2 34 (H) 22 - 32 mmol/L   Glucose, Bld 157 (H) 70 - 99 mg/dL   BUN 21 8 - 23 mg/dL   Creatinine, Ser 0.45 0.44 - 1.00 mg/dL   Calcium 8.3 (L) 8.9 - 10.3 mg/dL   GFR calc non Af Amer >60 >60 mL/min   GFR calc Af Amer >60 >60 mL/min   Anion gap 7 5 - 15    Comment: Performed at [facility], [address] [place], [place] [address]  CBC     Status: None   Collection Time: 11/16/18  5:21 AM  Result Value Ref Range   WBC 8.6 4.0 - 10.5 K/uL   RBC 3.92 3.87 - 5.11 MIL/uL   Hemoglobin 12.5 12.0 - 15.0 g/dL   HCT 39.0 36.0 - 46.0 %   MCV 99.5 80.0 - 100.0 fL   MCH 31.9 26.0 - 34.0 pg   MCHC 32.1 30.0 - 36.0 g/dL   RDW 13.6 11.5 - 15.5 %   Platelets 231 150 - 400 K/uL   nRBC 0.0 0.0 - 0.2 %    Comment: Performed at [facility], [address] [place], [place] [address]  Basic metabolic panel     Status: Abnormal   Collection Time: 11/16/18  5:21 AM  Result Value  Ref Range   Sodium 138 135 - 145 mmol/L   Potassium 4.1 3.5 - 5.1 mmol/L   Chloride 97 (L) 98 - 111 mmol/L   CO2 33 (H) 22 - 32 mmol/L   Glucose, Bld 143 (H) 70 - 99 mg/dL   BUN 21 8 - 23 mg/dL   Creatinine, Ser 0.40 (L) 0.44 - 1.00 mg/dL   Calcium 8.7 (L) 8.9 - 10.3 mg/dL   GFR calc non Af Amer >60 >60 mL/min   GFR calc Af Amer >60 >60 mL/min   Anion gap 8 5 - 15    Comment: Performed at [facility], [address] [place], [place] [address]    Radiology: Dg Chest 1 View  Result Date: 11/12/2018 CLINICAL DATA:  Follow-up pleural effusion EXAM: CHEST  1 VIEW COMPARISON:  None. FINDINGS: Heart size is mildly enlarged. Chronic aortic atherosclerosis. Pulmonary venous hypertension with interstitial and alveolar edema persists. Persistent effusions, right larger than left, with volume loss at lung bases. IMPRESSION: Persisting congestive heart failure pattern with edema, effusions and volume loss. Electronically Signed   By: Nelson Chimes M.D.   On: 11/12/2018 08:48    No results found.  Dg Chest 1 View  Result Date: 11/12/2018 CLINICAL DATA:  Follow-up pleural effusion EXAM: CHEST  1 VIEW COMPARISON:  None. FINDINGS: Heart size is mildly enlarged. Chronic aortic atherosclerosis. Pulmonary venous hypertension with interstitial and alveolar edema persists. Persistent effusions, right larger than left, with volume loss at lung bases. IMPRESSION: Persisting congestive heart failure pattern with edema, effusions and volume loss. Electronically Signed   By: Nelson Chimes M.D.   On: 11/12/2018 08:48   Dg Chest Port 1 View  Result Date: 11/14/2018 CLINICAL DATA:  Shortness of breath and pleural effusion EXAM: PORTABLE CHEST 1 VIEW COMPARISON:  11/12/2018 and prior exams FINDINGS: Cardiomegaly with pulmonary vascular congestion again noted. Pulmonary edema has decreased since the prior study. Bilateral pleural effusions and bibasilar atelectasis/consolidation appear slightly decreased.  No  pneumothorax or acute bony abnormality identified. IMPRESSION: Decreased pulmonary edema. Slightly decreased bilateral pleural effusions and bibasilar atelectasis/consolidation. Electronically Signed   By: Margarette Canada M.D.   On: 11/14/2018 11:39   Dg Chest Portable 1 View  Result Date: 11/10/2018 CLINICAL DATA:  Shortness of breath EXAM: PORTABLE CHEST 1 VIEW COMPARISON:  10/10/2018 FINDINGS: Cardiac shadow remains enlarged. Aortic calcifications are seen. Bilateral pleural effusions right greater than left are now seen new from the prior exam. Underlying right basilar infiltrate is likely present. Mild vascular congestion is noted without interstitial edema. No bony abnormality is noted. IMPRESSION: New pleural effusions and right basilar infiltrate. Increase in vascular congestion. Electronically Signed   By: Inez Catalina M.D.   On: 11/10/2018 18:15      Assessment and Plan: Patient Active Problem List   Diagnosis Date Noted  . Acute heart failure with preserved ejection fraction (HFpEF) ([place])   . Shortness of breath 10/21/2018  . Atrial fibrillation with rapid ventricular response ([place]) 10/10/2018  . Atrial fibrillation with RVR ([place]) 10/10/2018  . Nocturnal hypoxemia due to asthma 10/29/2017  . Acute pain of right wrist 09/30/2017  . Hyperlipidemia 07/23/2017  . Osteoporosis, post-menopausal 07/03/2017  . Chronic obstructive pulmonary disease, unspecified ([place]) 05/28/2017  . Pain in left knee 05/28/2017  . Essential (primary) hypertension 05/28/2017  . Cardiac arrhythmia 05/28/2017  . Hypothyroidism 05/28/2017  . Injury of right hip 06/15/2015  . Mild intermittent asthma without complication 43/88/8757    1. SOB likely due to CHF due to underlying a fib. She is on oxygen will check her ambulatory pulsox now. She already uses the oxygen at night 2. A fib patient is being followed by cardiology for this medications are being adjusted 3. COPD/Asthma this has been stable no  issues 4. CHF reviewed CXR continue with diuretics as prescribed  General Counseling: I have discussed the findings of the evaluation and examination with Arrielle.  I have also discussed any further diagnostic evaluation thatmay be needed or ordered today. Emelda verbalizes understanding of the findings of todays visit. We also reviewed her medications today and discussed drug interactions and side effects including but not limited excessive drowsiness and altered mental states. We also discussed that there is always a risk not just to her but also people around her. she has been encouraged to call the office with any questions or concerns that should arise related to todays visit.    Time spent: 48mn  I have personally obtained a history, examined the patient, evaluated laboratory and imaging results, formulated the assessment and plan and placed orders.    SAllyne Gee MD [facility]

## 2018-11-19 NOTE — Telephone Encounter (Signed)
Returned call to Cecille Rubin, Therapist, sports with [employer]. She received call last night that pt had BP of 97/48, HR 66 when she was laying in bed.   Pt with recent hospitalization and DCCV for a fib w RVR, low sats.  Pt on amiodarone taper, starting lower dose today of 200 mg BID.   BP 9 pm 111/55, HR 66 BP today 128/71, HR 65.  Daughter [name]) reports that she was overall been feeling well. Denies chest pain, inc dyspnea, no cough or fevers. No LE swelling.   Pt has been requiring o2 during the day, saw Humphrey Rolls pulm this am and prescribed with 2L o2 PRN for sats < 90%.   Cecille Rubin RN said that home health will be expiring soon and RN feels that the patient and family would benefit from ongoing home health nursing for CHF. She request 2 additional weeks at 2 x weekly. And once a week for 6 weeks.   I let RN and family know that I will call them back with any updates from Dr. Fletcher Anon.

## 2018-11-19 NOTE — Telephone Encounter (Signed)
Cecille Rubin, a nurse from [place] home [facility], calling in regarding some issues as daughter before. Last night patients BP was  97/45, patient on amiodarone that tapers down starting today. She needs orders to continue seeing the patient for disease and medication management teaching. [place] contact number [phone]

## 2018-11-19 NOTE — Patient Instructions (Signed)
Home Oxygen Use, Adult When a medical condition keeps you from getting enough oxygen, your health care provider may instruct you to take extra oxygen at home. Your health care provider will let you know:  When to take oxygen.  For how long to take oxygen.  How quickly oxygen should be delivered (flow rate), in liters per minute (LPM or L/M). Home oxygen can be given through:  A mask.  A nasal cannula. This is a device or tube that goes in the nostrils.  A transtracheal catheter. This is a small, flexible tube placed in the trachea.  A tracheostomy. This is a surgically made opening in the trachea. These devices are connected with tubing to an oxygen source, such as:  A tank. Tanks hold oxygen in gas form. They must be replaced when the oxygen is used up.  A liquid oxygen device. This holds oxygen in liquid form. It must be replaced when the oxygen is used up.  An oxygen concentrator machine. This filters oxygen in the room. It uses electricity, so you must have a backup cylinder of oxygen in case the power goes out. Supplies needed: To use oxygen, you will need:  A mask, nasal cannula, transtracheal catheter, or tracheostomy.  An oxygen tank, a liquid oxygen device, or an oxygen concentrator.  The tape that your health care provider recommends (optional). If you use a transtracheal catheter and your prescribed flow rate is 1 LPM or greater, you will also need a humidifier. Risks and complications  Fire. This can happen if the oxygen is exposed to a heat source, flame, or spark.  Injury to skin. This can happen if liquid oxygen touches your skin.  Organ damage. This can happen if you get too little oxygen. How to use oxygen Your health care provider or a representative from your medical device company will show you how to use your oxygen device. Follow her or his instructions. The instructions may look something like this: 1. Wash your hands. 2. If you use an oxygen  concentrator, make sure it is plugged in. 3. Place one end of the tube into the port on the tank, device, or machine. 4. Place the mask over your nose and mouth. Or, place the nasal cannula and secure it with tape if instructed. If you use a tracheostomy or transtracheal catheter, connect it to the oxygen source as directed. 5. Make sure the liter-flow setting on the machine is at the level prescribed by your health care provider. 6. Turn on the machine or adjust the knob on the tank or device to the correct liter-flow setting. 7. When you are done, turn off and unplug the machine, or turn the knob to OFF. How to clean and care for the oxygen supplies Nasal cannula  Clean it with a warm, wet cloth daily or as needed.  Wash it with a liquid soap once a week.  Rinse it thoroughly once or twice a week.  Replace it every 2-4 weeks.  If you have an infection, such as a cold or pneumonia, change the cannula when you get better. Mask  Replace it every 2-4 weeks.  If you have an infection, such as a cold or pneumonia, change the mask when you get better. Humidifier bottle  Wash the bottle between each refill: ? Wash it with soap and warm water. ? Rinse it thoroughly. ? Disinfect it and its top. ? Air-dry it.  Make sure it is dry before you refill it. Oxygen concentrator  Clean   the air filter at least twice a week according to directions from your home medical equipment and service company.  Wipe down the cabinet every day. To do this: ? Unplug the unit. ? Wipe down the cabinet with a damp cloth. ? Dry the cabinet. Other equipment  Change any extra tubing every 1-3 months.  Follow instructions from your health care provider about taking care of any other equipment. Safety tips Fire safety tips   Keep your oxygen and oxygen supplies at least 5 ft away from sources of heat, flames, and sparks at all times.  Do not allow smoking near your oxygen. Put up "no smoking" signs in  your home. Avoid smoking areas when in public.  Do not use materials that can burn (are flammable) while you use oxygen.  When you go to a restaurant with portable oxygen, ask to be seated in the nonsmoking section.  Keep a fire extinguisher close by. Let your fire department know that you have oxygen in your home.  Test your home smoke detectors regularly. Traveling  Secure your oxygen tank in the vehicle so that it does not move around. Follow instructions from your medical device company about how to safely secure your tank.  Make sure you have enough oxygen for the amount of time you will be away from home.  If you are planning air travel, contact the airline to find out if they allow the use of an approved portable oxygen concentrator. You may also need documents from your health care provider and medical device company before you travel. General safety tips  If you use an oxygen cylinder, make sure it is in a stand or secured to an object that will not move (fixed object).  If you use liquid oxygen, make sure its container is kept upright.  If you use an oxygen concentrator: ? Tell your electric company. Make sure you are given priority service in the event that your power goes out. ? Avoid using extension cords, if possible. Follow these instructions at home:  Use oxygen only as told by your health care provider.  Do not use alcohol or other drugs that make you relax (sedating drugs) unless instructed. They can slow down your breathing rate and make it hard to get in enough oxygen.  Know how and when to order a refill of oxygen.  Always keep a spare tank of oxygen. Plan ahead for holidays when you may not be able to get a prescription filled.  Use water-based lubricants on your lips or nostrils. Do not use oil-based products like petroleum jelly.  To prevent skin irritation on your cheeks or behind your ears, tuck some gauze under the tubing. Contact a health care  provider if:  You get headaches often.  You have shortness of breath.  You have a lasting cough.  You have anxiety.  You are sleepy all the time.  You develop an illness that affects your breathing.  You cannot exercise at your regular level.  You are restless.  You have difficult or irregular breathing, and it is getting worse.  You have a fever.  You have persistent redness under your nose. Get help right away if:  You are confused.  You have blue lips or fingernails.  You are struggling to breathe. Summary  Your health care provider or a representative from your medical device company will show you how to use your oxygen device. Follow her or his instructions.  If you use an oxygen concentrator, make   sure it is plugged in.  Make sure the liter-flow setting on the machine is at the level prescribed by your health care provider.  Keep your oxygen and oxygen supplies at least 5 ft away from sources of heat, flames, and sparks at all times. This information is not intended to replace advice given to you by your health care provider. Make sure you discuss any questions you have with your health care provider. Document Released: 06/29/2003 Document Revised: 09/25/2017 Document Reviewed: 10/31/2015 Elsevier Patient Education  2020 [employer]  

## 2018-11-19 NOTE — Patient Outreach (Signed)
[facility]) Care Management  11/19/2018  Ashley Weiss [birth year] [id number]  EMMI: general discharge red alert Referral date: 11/19/18 Referral reason: wounds healing well; No Insurance: Medicare Day # 1  Attempt #1  Telephone call to patient regarding EMMI general discharge red alert. Unable to reach patient. HIPAA compliant voice message left with call back phone number.    PLAN: RNCM will attempt 2nd telephone call to patient within 4 business days.  RNCM will send outreach letter to attempt contact.   Quinn Plowman RN,BSN,CCM [facility] Telephonic  [phone]

## 2018-11-19 NOTE — Telephone Encounter (Signed)
Continue to monitor for now without making any changes. I am okay extending home visits for an extra 2 weeks.

## 2018-11-20 ENCOUNTER — Other Ambulatory Visit: Payer: Self-pay

## 2018-11-20 DIAGNOSIS — I5031 Acute diastolic (congestive) heart failure: Secondary | ICD-10-CM | POA: Diagnosis not present

## 2018-11-20 DIAGNOSIS — J918 Pleural effusion in other conditions classified elsewhere: Secondary | ICD-10-CM | POA: Diagnosis not present

## 2018-11-20 DIAGNOSIS — I110 Hypertensive heart disease with heart failure: Secondary | ICD-10-CM | POA: Diagnosis not present

## 2018-11-20 DIAGNOSIS — I501 Left ventricular failure: Secondary | ICD-10-CM | POA: Diagnosis not present

## 2018-11-20 DIAGNOSIS — I480 Paroxysmal atrial fibrillation: Secondary | ICD-10-CM | POA: Diagnosis not present

## 2018-11-20 DIAGNOSIS — I0981 Rheumatic heart failure: Secondary | ICD-10-CM | POA: Diagnosis not present

## 2018-11-20 NOTE — Telephone Encounter (Signed)
Noted. Thanks.

## 2018-11-20 NOTE — Telephone Encounter (Signed)
Spoke to Gilbert, daughter, okay per [name]).   Reviewed POC from Dr. Fletcher Anon. She verbalized understanding and will continue to monitor vs. No med changes at this time. No new orders.   Call to [facility] with [facility] [phone]). Discussed POC and gave verbal for continuing [facility] for CHF teaching and support.   Cecille Rubin will fax form to clinic to be signed by Dr. Fletcher Anon. Made his nurse aware of incoming fax.   Advised pt to call for any further questions or concerns.

## 2018-11-20 NOTE — Discharge Summary (Signed)
[facility] [facility] [place] NAME: Ashley Weiss    MR#:  [id number]  DATE OF BIRTH:  [birth year]  DATE OF ADMISSION:  11/10/2018 ADMITTING PHYSICIAN: Christel Mormon, MD  DATE OF DISCHARGE: 11/16/2018  3:49 PM  PRIMARY CARE PHYSICIAN: Lavera Guise, MD   ADMISSION DIAGNOSIS:  Atrial fibrillation with rapid ventricular response (HCC) [I48.91] Acute on chronic respiratory failure with hypoxia (HCC) [J96.21]  DISCHARGE DIAGNOSIS:  Active Problems:   Atrial fibrillation with RVR (HCC)   Acute heart failure with preserved ejection fraction (HFpEF) ([place])   SECONDARY DIAGNOSIS:   Past Medical History:  Diagnosis Date  . Arthritis   . Asthma   . Cancer ([place])    skin  . Depression   . DVT (deep venous thrombosis) ([place])   . Hyperlipidemia   . Hypertension   . Phlebitis      ADMITTING HISTORY  HISTORY OF PRESENT ILLNESS:  Ashley Weiss  is a 85 y.o. female with a known history of atrial fibrillation, CHF, COPD, hypertension, hyperlipidemia. She presented to the emergency room for evaluation of "fast heart rate "with shortness of breath and cough over the last 2 to 3 days.  She has a history of chronic respiratory failure and is on oxygen at 2 L/min per nasal cannula at home.  She reports a history of atrial fibrillation with difficulty controlling the rate over the last few weeks.  She has been seeing Dr. Fletcher Anon and is planned for cardioversion on this upcoming Monday according to the patient.  Patient is currently taking diltiazem 180 mg daily as well as metoprolol tartrate 150 mg daily.  She is on Eliquis 2.5 mg twice daily as well.  She was found to be in atrial fibrillation with RVR with heart rate sustained in the 140s in the emergency room.  She was started on diltiazem infusion.  She denies chest pain, nausea, vomiting, diarrhea.  She denies abdominal pain.  She denies fevers or chills.  She is experiencing mild palpitations.  She reports shortness  of breath and hacking type nonproductive cough associated with elevated heart rate over the last few days.  BNP is 283.  Chest x-ray demonstrates new pleural effusions and right basilar infiltrate with increasing vascular congestion.  She received IV Lasix in the emergency room as well as DuoNeb treatment for shortness of breath.  We have admitted her to the hospitalist service for further management.   HOSPITAL COURSE:   67 yofemalewithknown history of atrial fibrillation, CHF, COPD, hypertension, hyperlipidemiawho presented with A fib with RVR.  1.Atrial fibrillation with RVR -continue oral diltiazem (s/p dilt gtt) and metoprolol as BP will tolerate -Cardiologyfollowed the patient during hospital stay -s/p DCCV and converting back to atrial fibrillation with RVR -Patient converted normal sinus rhythm on amiodarone drip.  Discharged home on amiodarone 400 mg twice a day for 3 more days and then 200 mg twice a day.  Follow-up with cardiology. -Continue Eliquis2.5 mg BID- CHA2DSVASc score of 5.  2. Acute on chronic HFpEF with BL pleural effusions: Treated with IV Lasix and diuresed well.  At time of discharge fluid overload has resolved.  She will be switched to oral Lasix.  Prescription given.  CHF clinic follow-up.  4.  Fall at home -Patient is high fall risk. Precautions in place no imaging done because patient did not have any symptoms of pain.  She is alert, awake, oriented. -Continue to monitor -PT and OT recommending home health; order placed  for DME rolling walker  Stable for discharge home.  CONSULTS OBTAINED:   Cardiology, physical therapy  DRUG ALLERGIES:   Allergies  Allergen Reactions  . Hydralazine Itching and Swelling  . Biaxin [Clarithromycin] Nausea And Vomiting  . Ciprofloxacin Other (See Comments)    Hand cramping   . Meloxicam Other (See Comments)    Unknown  . Minocycline Other (See Comments)    Unknown    DISCHARGE MEDICATIONS:    Allergies as of 11/16/2018      Reactions   Hydralazine Itching, Swelling   Biaxin [clarithromycin] Nausea And Vomiting   Ciprofloxacin Other (See Comments)   Hand cramping   Meloxicam Other (See Comments)   Unknown   Minocycline Other (See Comments)   Unknown      Medication List    STOP taking these medications   diltiazem 180 MG 24 hr capsule Commonly known as: DILACOR XR   diltiazem 60 MG tablet Commonly known as: CARDIZEM     TAKE these medications   albuterol 108 (90 Base) MCG/ACT inhaler Commonly known as: VENTOLIN HFA Inhale 2 puffs into the lungs every 6 (six) hours as needed for wheezing or shortness of breath.   amiodarone 200 MG tablet Commonly known as: PACERONE Take 2 tablets (400 mg total) by mouth 2 (two) times daily. 400 mg BID for 3 days and then 200 mg BID   apixaban 2.5 MG Tabs tablet Commonly known as: ELIQUIS Take 1 tablet (2.5 mg total) by mouth 2 (two) times daily.   denosumab 60 MG/ML Sosy injection Commonly known as: PROLIA Inject 60 mg into the skin every 6 (six) months.   diltiazem 120 MG 24 hr capsule Commonly known as: CARDIZEM CD Take 1 capsule (120 mg total) by mouth daily.   fluticasone 110 MCG/ACT inhaler Commonly known as: FLOVENT HFA Inhale 2 puffs into the lungs 2 (two) times daily.   fluticasone 50 MCG/ACT nasal spray Commonly known as: FLONASE Place 1 spray into both nostrils daily as needed for allergies or rhinitis.   FreeStyle Freedom Lite w/Device Kit 1 kit by Does not apply route daily.   freestyle lancets Use as directed once daily diag e11.65   furosemide 40 MG tablet Commonly known as: LASIX Take 1 tablet (40 mg total) by mouth daily.   glucose blood test strip Commonly known as: FREESTYLE LITE Use as directed once a daily Diag E11.65   ipratropium-albuterol 0.5-2.5 (3) MG/3ML Soln Commonly known as: DUONEB Take 3 mLs by nebulization 3 (three) times daily as needed. Use 1 vial 3 times daily as needed  for SOB dia j45.1   levothyroxine 25 MCG tablet Commonly known as: SYNTHROID TAKE 1 DAILY BY MOUTH IN THE MORNING ON AN EMPTY STOMACH What changed: See the new instructions.   metoprolol tartrate 100 MG tablet Commonly known as: LOPRESSOR Take 1.5 tablets twice daily. What changed:   how much to take  how to take this  when to take this   mometasone 0.1 % lotion Commonly known as: ELOCON Apply 4 drops topically at bedtime as needed for itching.   montelukast 10 MG tablet Commonly known as: SINGULAIR TAKE 1 TABLET BY MOUTH EVERY DAY FOR COUGH What changed:   how much to take  how to take this  when to take this   multivitamin with minerals tablet Take 1 tablet by mouth daily.   OXYGEN Inhale 2 L into the lungs every evening. Sleep with nasal cannula w/ O2 at 2lpm.   potassium  chloride 10 MEQ tablet Commonly known as: K-DUR Take 1 tablet (10 mEq total) by mouth daily.   rosuvastatin 5 MG tablet Commonly known as: CRESTOR Take 1 tablet (5 mg total) by mouth daily at 6 PM.   sertraline 100 MG tablet Commonly known as: ZOLOFT Take 1 tablet (100 mg total) by mouth daily.   vitamin C 500 MG tablet Commonly known as: ASCORBIC ACID Take 500 mg by mouth daily.       Today   VITAL SIGNS:  Blood pressure 115/64, pulse 76, temperature 98.3 F (36.8 C), resp. rate 18, height 5' 2"  (1.575 m), weight 54.7 kg, SpO2 96 %.  I/O:  No intake or output data in the 24 hours ending 11/20/18 1456  PHYSICAL EXAMINATION:  Physical Exam  GENERAL:  85 y.o.-year-old patient lying in the bed with no acute distress.  LUNGS: Normal breath sounds bilaterally, no wheezing, rales,rhonchi or crepitation. No use of accessory muscles of respiration.  CARDIOVASCULAR: S1, S2 normal. No murmurs, rubs, or gallops.  ABDOMEN: Soft, non-tender, non-distended. Bowel sounds present. No organomegaly or mass.  NEUROLOGIC: Moves all 4 extremities. PSYCHIATRIC: The patient is alert and  oriented x 3.  SKIN: No obvious rash, lesion, or ulcer.   DATA REVIEW:   CBC Recent Labs  Lab 11/16/18 0521  WBC 8.6  HGB 12.5  HCT 39.0  PLT 231    Chemistries  Recent Labs  Lab 11/16/18 0521  NA 138  K 4.1  CL 97*  CO2 33*  GLUCOSE 143*  BUN 21  CREATININE 0.40*  CALCIUM 8.7*    Cardiac Enzymes No results for input(s): TROPONINI in the last 168 hours.  Microbiology Results  Results for orders placed or performed during the hospital encounter of 11/10/18  SARS Coronavirus 2 (CEPHEID- Performed in [facility] hospital lab), Hosp Order     Status: None   Collection Time: 11/10/18  5:47 PM   Specimen: Nasopharyngeal Swab  Result Value Ref Range Status   SARS Coronavirus 2 NEGATIVE NEGATIVE Final    Comment: (NOTE) If result is NEGATIVE SARS-CoV-2 target nucleic acids are NOT DETECTED. The SARS-CoV-2 RNA is generally detectable in upper and lower  respiratory specimens during the acute phase of infection. The lowest  concentration of SARS-CoV-2 viral copies this assay can detect is 250  copies / mL. A negative result does not preclude SARS-CoV-2 infection  and should not be used as the sole basis for treatment or other  patient management decisions.  A negative result may occur with  improper specimen collection / handling, submission of specimen other  than nasopharyngeal swab, presence of viral mutation(s) within the  areas targeted by this assay, and inadequate number of viral copies  (<250 copies / mL). A negative result must be combined with clinical  observations, patient history, and epidemiological information. If result is POSITIVE SARS-CoV-2 target nucleic acids are DETECTED. The SARS-CoV-2 RNA is generally detectable in upper and lower  respiratory specimens dur ing the acute phase of infection.  Positive  results are indicative of active infection with SARS-CoV-2.  Clinical  correlation with patient history and other diagnostic information is   necessary to determine patient infection status.  Positive results do  not rule out bacterial infection or co-infection with other viruses. If result is PRESUMPTIVE POSTIVE SARS-CoV-2 nucleic acids MAY BE PRESENT.   A presumptive positive result was obtained on the submitted specimen  and confirmed on repeat testing.  While 2019 novel coronavirus  (SARS-CoV-2) nucleic acids  may be present in the submitted sample  additional confirmatory testing may be necessary for epidemiological  and / or clinical management purposes  to differentiate between  SARS-CoV-2 and other Sarbecovirus currently known to infect humans.  If clinically indicated additional testing with an alternate test  methodology [phone]) is advised. The SARS-CoV-2 RNA is generally  detectable in upper and lower respiratory sp ecimens during the acute  phase of infection. The expected result is Negative. Fact Sheet for Patients:  StrictlyIdeas.no Fact Sheet for Healthcare Providers: BankingDealers.co.za This test is not yet approved or cleared by the [place] FDA and has been authorized for detection and/or diagnosis of SARS-CoV-2 by FDA under an Emergency Use Authorization (EUA).  This EUA will remain in effect (meaning this test can be used) for the duration of the COVID-19 declaration under Section 564(b)(1) of the Act, 21 U.S.C. section 360bbb-3(b)(1), unless the authorization is terminated or revoked sooner. Performed at [facility], [address] [place], [place] [address]   Blood Culture (routine x 2)     Status: None   Collection Time: 11/10/18  6:33 PM   Specimen: BLOOD  Result Value Ref Range Status   Specimen Description BLOOD RIGHT ANTECUBITAL  Final   Special Requests   Final    BOTTLES DRAWN AEROBIC AND ANAEROBIC Blood Culture results may not be optimal due to an inadequate volume of blood received in culture bottles   Culture   Final     NO GROWTH 5 DAYS Performed at [facility], [address], [place], [place] [address]    Report Status 11/15/2018 FINAL  Final  Blood Culture (routine x 2)     Status: Abnormal   Collection Time: 11/10/18  6:33 PM   Specimen: BLOOD  Result Value Ref Range Status   Specimen Description   Final    BLOOD LEFT ANTECUBITAL Performed at [facility], [address] [place], [place] [address]    Special Requests   Final    BOTTLES DRAWN AEROBIC AND ANAEROBIC Blood Culture adequate volume Performed at [facility], [address] [place], [place] [address]    Culture  Setup Time   Final    Organism ID to follow GRAM POSITIVE COCCI IN BOTH AEROBIC AND ANAEROBIC BOTTLES CRITICAL RESULT CALLED TO, READ BACK BY AND VERIFIED WITH:  SCOTT HALL ON 11/11/2018 AT 10:22. TIK Performed at [facility], [address] [place], [place] [address]    Culture (A)  Final    STAPHYLOCOCCUS SPECIES (COAGULASE NEGATIVE) THE SIGNIFICANCE OF ISOLATING THIS ORGANISM FROM A SINGLE SET OF BLOOD CULTURES WHEN MULTIPLE SETS ARE DRAWN IS UNCERTAIN. PLEASE NOTIFY THE MICROBIOLOGY DEPARTMENT WITHIN ONE WEEK IF SPECIATION AND SENSITIVITIES ARE REQUIRED. Performed at [facility], [place] [address] [place], [place] [address]    Report Status 11/16/2018 FINAL  Final  Urine culture     Status: Abnormal   Collection Time: 11/10/18  6:33 PM   Specimen: Urine, Random  Result Value Ref Range Status   Specimen Description   Final    URINE, RANDOM Performed at [facility], [address], [place], [place] [address]    Special Requests   Final    NONE Performed at [facility], [address] [place],  [address]    Culture MULTIPLE SPECIES PRESENT, SUGGEST RECOLLECTION (A)  Final   Report Status 11/12/2018 FINAL  Final  Blood Culture ID Panel (Reflexed)     Status: Abnormal  Collection Time: 11/10/18  6:33 PM  Result Value Ref Range Status    Enterococcus species NOT DETECTED NOT DETECTED Final   Listeria monocytogenes NOT DETECTED NOT DETECTED Final   Staphylococcus species DETECTED (A) NOT DETECTED Final    Comment: Methicillin (oxacillin) susceptible coagulase negative staphylococcus. Possible blood culture contaminant (unless isolated from more than one blood culture draw or clinical case suggests pathogenicity). No antibiotic treatment is indicated for blood  culture contaminants. CRITICAL RESULT CALLED TO, READ BACK BY AND VERIFIED WITH:  SCOTT HALL ON 11/11/2018 AT 10:22. TIK    Staphylococcus aureus (BCID) NOT DETECTED NOT DETECTED Final   Methicillin resistance NOT DETECTED NOT DETECTED Final   Streptococcus species NOT DETECTED NOT DETECTED Final   Streptococcus agalactiae NOT DETECTED NOT DETECTED Final   Streptococcus pneumoniae NOT DETECTED NOT DETECTED Final   Streptococcus pyogenes NOT DETECTED NOT DETECTED Final   Acinetobacter baumannii NOT DETECTED NOT DETECTED Final   Enterobacteriaceae species NOT DETECTED NOT DETECTED Final   Enterobacter cloacae complex NOT DETECTED NOT DETECTED Final   Escherichia coli NOT DETECTED NOT DETECTED Final   Klebsiella oxytoca NOT DETECTED NOT DETECTED Final   Klebsiella pneumoniae NOT DETECTED NOT DETECTED Final   Proteus species NOT DETECTED NOT DETECTED Final   Serratia marcescens NOT DETECTED NOT DETECTED Final   Haemophilus influenzae NOT DETECTED NOT DETECTED Final   Neisseria meningitidis NOT DETECTED NOT DETECTED Final   Pseudomonas aeruginosa NOT DETECTED NOT DETECTED Final   Candida albicans NOT DETECTED NOT DETECTED Final   Candida glabrata NOT DETECTED NOT DETECTED Final   Candida krusei NOT DETECTED NOT DETECTED Final   Candida parapsilosis NOT DETECTED NOT DETECTED Final   Candida tropicalis NOT DETECTED NOT DETECTED Final    Comment: Performed at [facility], [address], [place], [place] [address]    RADIOLOGY:  No results found.  Follow  up with PCP in 1 week.  Management plans discussed with the patient, family and they are in agreement.  CODE STATUS:  Code Status History    Date Active Date Inactive Code Status Order ID Comments User Context   11/10/2018 2338 11/16/2018 1926 DNR [id number]  Mayer Camel, NP Inpatient   10/10/2018 1547 10/12/2018 1811 DNR [id number]  Lang Snow, NP ED   Advance Care Planning Activity    Questions for Most Recent Historical Code Status (Order [id number])    Question Answer Comment   In the event of cardiac or respiratory ARREST Do not call a "code blue"    In the event of cardiac or respiratory ARREST Do not perform Intubation, CPR, defibrillation or ACLS    In the event of cardiac or respiratory ARREST Use medication by any route, position, wound care, and other measures to relive pain and suffering. May use oxygen, suction and manual treatment of airway obstruction as needed for comfort.         Advance Directive Documentation     Most Recent Value  Type of Advance Directive  Living will, Healthcare Power of Attorney  Pre-existing out of facility DNR order (yellow form or pink MOST form)  -  "MOST" Form in Place?  -      TOTAL TIME TAKING CARE OF THIS PATIENT ON DAY OF DISCHARGE: more than 30 minutes.   Leia Alf Tianne Plott M.D on 11/20/2018 at 2:56 PM  Between 7am to 6pm - Pager - [phone]  After 6pm go to [email or web address] - password [email or web address] [place]  Office  [phone]  CC: Primary care physician; Lavera Guise, MD  Note: This dictation was prepared with Dragon dictation along with smaller phrase technology. Any transcriptional errors that result from this process are unintentional.

## 2018-11-20 NOTE — Patient Outreach (Signed)
[facility]) Care Management  11/20/2018  Ashley Weiss Nov 14, 1919 [id number]  EMMI:general discharge red alert Referral date:11/19/18 Referral reason:wounds healing well; No Insurance: Medicare Day #1   Telephone call to patient regarding EMMI general discharge red alert. HIPAA verified with patient.  Patient gave verbal permission to speak with her daughter, Loel Ro regarding.  Daughter states patient was in the hospital for a cardioversion. She denies patient having any wounds. Daughter states patient is receiving nursing services with [facility] home health.  She states patient had a follow up visit with her pulmonologist on yesterday 11/19/18.  She states patient's pulmonologist and primary care provider are in the same practice. Daughter states patient is taking her medications as prescribed. Daughter reports patient had a low blood pressures within the past couple of days. She states she contacted the 24 hour nurse for [facility] health and they followed up with patients doctor. Daughter states patients medication dose for her amiodarone has decreased. She states patient's blood pressure to day has been stable at 120/60, pulse 65 and oxygen saturation 96%.  Daughter states patient is wearing oxygen now. She reports she and her sister are keeping a log of patient's  blood pressure/ pulse and oxygen saturation. Daughter states patient is doing fine, eating well and ambulating better with her walker. RNCM discussed and offered [facility] care management services. Daughter states they are receiving good educational information related to patients medical condition from the [facility] home health nurse. She states she feels comfortable regarding how to manage patient's conditions at this time and knows which doctors to reports symptoms to. RNCM discussed Advanced directives with daughter. Daughter states her sister provided this information to the hospital.  RNCM advised daughter to  verify if the information provided was a health are power of attorney. RNCM also offered to mail an Advance directive to patient. Daughter verbally agreed and expressed appreciation of call and information.  RNCM advised patient to notify MD of any changes in condition prior to scheduled appointment. RNCM provided contact name and number: [phone] or main office number [phone] and 24 hour nurse advise line [phone] by mail.  RNCM verified patient aware of 911 services for urgent/ emergent needs.  PLAN: RNCM will close case due to patient being assessed and having no further needs.  RNCM will mail patient [facility] care management brochure/ magnet and advance directive  Quinn Plowman RN,BSN,CCM [facility] Telephonic  [phone]

## 2018-11-20 NOTE — Patient Outreach (Addendum)
[facility]) Care Management  11/20/2018  CHARLENA HAUB [birth year] [id number]  EMMI: general discharge red alert Referral date: 11/19/18 Referral reason: wounds healing well; No Insurance: Medicare Day # 1  Attempt #2  Telephone call to patient's daughter Emmalise Huard. Unable to reach. HIPAA compliant voice message left with call back phone number.   PLAN; RNCM will attempt 3rd telephone call to patient within 4 business days.  RNCM will send outreach letter to attempt contact. ( correction:  Outreach letter sent to patient on 11/19/18  Quinn Plowman RN,BSN,CCM [facility] Telephonic  [phone]

## 2018-11-21 DIAGNOSIS — J918 Pleural effusion in other conditions classified elsewhere: Secondary | ICD-10-CM | POA: Diagnosis not present

## 2018-11-21 DIAGNOSIS — I110 Hypertensive heart disease with heart failure: Secondary | ICD-10-CM | POA: Diagnosis not present

## 2018-11-21 DIAGNOSIS — I5031 Acute diastolic (congestive) heart failure: Secondary | ICD-10-CM | POA: Diagnosis not present

## 2018-11-21 DIAGNOSIS — I501 Left ventricular failure: Secondary | ICD-10-CM | POA: Diagnosis not present

## 2018-11-21 DIAGNOSIS — I480 Paroxysmal atrial fibrillation: Secondary | ICD-10-CM | POA: Diagnosis not present

## 2018-11-21 DIAGNOSIS — I0981 Rheumatic heart failure: Secondary | ICD-10-CM | POA: Diagnosis not present

## 2018-11-23 ENCOUNTER — Other Ambulatory Visit: Payer: Self-pay

## 2018-11-23 DIAGNOSIS — I501 Left ventricular failure: Secondary | ICD-10-CM | POA: Diagnosis not present

## 2018-11-23 DIAGNOSIS — I0981 Rheumatic heart failure: Secondary | ICD-10-CM | POA: Diagnosis not present

## 2018-11-23 DIAGNOSIS — I5031 Acute diastolic (congestive) heart failure: Secondary | ICD-10-CM | POA: Diagnosis not present

## 2018-11-23 DIAGNOSIS — I480 Paroxysmal atrial fibrillation: Secondary | ICD-10-CM | POA: Diagnosis not present

## 2018-11-23 DIAGNOSIS — I110 Hypertensive heart disease with heart failure: Secondary | ICD-10-CM | POA: Diagnosis not present

## 2018-11-23 DIAGNOSIS — J918 Pleural effusion in other conditions classified elsewhere: Secondary | ICD-10-CM | POA: Diagnosis not present

## 2018-11-23 NOTE — Patient Outreach (Signed)
[facility]) Care Management  11/23/2018  Ashley Weiss [birth year] [id number]  EMMI: general discharge Referral date: 11/23/18 Referral reason: sad/ hopeless/ anxious/ empty: yes Insurance: Medicare Day # 4   Telephone call to patient's daughter [name], [name].  HIPAA verified by daughter for patient.  Explained reason for call.  Daughter states patient is not having any issues with feeling sad/ hopeless/ anxious/ empty. She states they answered no to the automated call but it did not take.  Daughter states patient is doing fine at this time. Daughter had question regarding monitoring process of patient.  Daughter states, " should we be walking on eggshells with all of the home monitoring that's going on mom."  RNCM reassured daughter of home monitoring being a way to manage patients care at home and to be able to provide the doctor with information which directs her care.  Daughter verbalized understanding and appreciation.  Daughter denies any further needs at this time.  RNCM confirmed daughter has her contact phone number and 24 hour nurse advise line.  RNCM advised patient to notify MD of any changes in condition prior to scheduled appointment. RNCM verified patient aware of 911 services for urgent/ emergent needs.  PLAN: RNCM will close case due to patient being assessed and having no further needs.   Quinn Plowman RN,BSN,CCM [facility] Telephonic  [phone]

## 2018-11-24 ENCOUNTER — Ambulatory Visit: Payer: Medicare Other | Admitting: Adult Health

## 2018-11-24 ENCOUNTER — Telehealth: Payer: Self-pay

## 2018-11-24 DIAGNOSIS — J918 Pleural effusion in other conditions classified elsewhere: Secondary | ICD-10-CM | POA: Diagnosis not present

## 2018-11-24 DIAGNOSIS — I0981 Rheumatic heart failure: Secondary | ICD-10-CM | POA: Diagnosis not present

## 2018-11-24 DIAGNOSIS — I110 Hypertensive heart disease with heart failure: Secondary | ICD-10-CM | POA: Diagnosis not present

## 2018-11-24 DIAGNOSIS — I5031 Acute diastolic (congestive) heart failure: Secondary | ICD-10-CM | POA: Diagnosis not present

## 2018-11-24 DIAGNOSIS — I501 Left ventricular failure: Secondary | ICD-10-CM | POA: Diagnosis not present

## 2018-11-24 DIAGNOSIS — I480 Paroxysmal atrial fibrillation: Secondary | ICD-10-CM | POA: Diagnosis not present

## 2018-11-24 NOTE — Telephone Encounter (Signed)
[place] Patient new RX for portable and home concentrator orders Oxygen. beth

## 2018-11-26 ENCOUNTER — Encounter: Payer: Self-pay | Admitting: Physician Assistant

## 2018-11-26 ENCOUNTER — Ambulatory Visit
Admission: RE | Admit: 2018-11-26 | Discharge: 2018-11-26 | Disposition: A | Discharge status: Home / Self Care | Payer: Medicare Other | Source: Ambulatory Visit | Attending: Physician Assistant | Admitting: Physician Assistant

## 2018-11-26 ENCOUNTER — Other Ambulatory Visit: Payer: Self-pay

## 2018-11-26 ENCOUNTER — Other Ambulatory Visit
Admission: RE | Admit: 2018-11-26 | Discharge: 2018-11-26 | Disposition: A | Discharge status: Home / Self Care | Payer: Medicare Other | Source: Ambulatory Visit | Attending: Physician Assistant | Admitting: Physician Assistant

## 2018-11-26 ENCOUNTER — Ambulatory Visit (INDEPENDENT_AMBULATORY_CARE_PROVIDER_SITE_OTHER): Payer: Medicare Other | Admitting: Physician Assistant

## 2018-11-26 VITALS — BP 114/58 | HR 63 | Ht 62.0 in | Wt 119.0 lb

## 2018-11-26 DIAGNOSIS — I4892 Unspecified atrial flutter: Secondary | ICD-10-CM

## 2018-11-26 DIAGNOSIS — I4891 Unspecified atrial fibrillation: Secondary | ICD-10-CM | POA: Diagnosis not present

## 2018-11-26 DIAGNOSIS — R0989 Other specified symptoms and signs involving the circulatory and respiratory systems: Secondary | ICD-10-CM

## 2018-11-26 DIAGNOSIS — I5031 Acute diastolic (congestive) heart failure: Secondary | ICD-10-CM | POA: Diagnosis not present

## 2018-11-26 DIAGNOSIS — R05 Cough: Secondary | ICD-10-CM | POA: Diagnosis not present

## 2018-11-26 DIAGNOSIS — I6523 Occlusion and stenosis of bilateral carotid arteries: Secondary | ICD-10-CM

## 2018-11-26 LAB — COMPREHENSIVE METABOLIC PANEL
ALT: 25 U/L (ref 0–44)
AST: 27 U/L (ref 15–41)
Albumin: 3.5 g/dL (ref 3.5–5.0)
Alkaline Phosphatase: 63 U/L (ref 38–126)
Anion gap: 11 (ref 5–15)
BUN: 19 mg/dL (ref 8–23)
CO2: 33 mmol/L — ABNORMAL HIGH (ref 22–32)
Calcium: 9.1 mg/dL (ref 8.9–10.3)
Chloride: 92 mmol/L — ABNORMAL LOW (ref 98–111)
Creatinine, Ser: 0.57 mg/dL (ref 0.44–1.00)
GFR calc Af Amer: >60 mL/min (ref >60)
GFR calc non Af Amer: >60 mL/min (ref >60)
Glucose, Bld: 112 mg/dL — ABNORMAL HIGH (ref 70–99)
Potassium: 4 mmol/L (ref 3.5–5.1)
Sodium: 136 mmol/L (ref 135–145)
Total Bilirubin: 0.4 mg/dL (ref 0.3–1.2)
Total Protein: 8 g/dL (ref 6.5–8.1)

## 2018-11-26 LAB — CBC
HCT: 40 % (ref 36.0–46.0)
Hemoglobin: 13 g/dL (ref 12.0–15.0)
MCH: 31.6 pg (ref 26.0–34.0)
MCHC: 32.5 g/dL (ref 30.0–36.0)
MCV: 97.3 fL (ref 80.0–100.0)
Platelets: 255 10*3/uL (ref 150–400)
RBC: 4.11 MIL/uL (ref 3.87–5.11)
RDW: 13.2 % (ref 11.5–15.5)
WBC: 9.2 10*3/uL (ref 4.0–10.5)
nRBC: 0 % (ref 0.0–0.2)

## 2018-11-26 MED ORDER — ONE FLOW SPIROMETER DEVI: 0 refills | Status: DC

## 2018-11-26 MED ORDER — AMIODARONE HCL 200 MG PO TABS: 200.0000 mg | ORAL_TABLET | Freq: Every day | ORAL | 3 refills | Status: DC

## 2018-11-26 NOTE — Progress Notes (Signed)
Office Visit    Patient Name: Ashley Weiss Date of Encounter: 11/26/2018  Primary Care Provider:  Lavera Guise, MD Primary Cardiologist:  Kathlyn Sacramento, MD  [facility] follow-up  Past Medical History    Past Medical History:  Diagnosis Date   Arthritis    Asthma    Cancer ([place])    skin   Depression    DVT (deep venous thrombosis) ([place])    Hyperlipidemia    Hypertension    Phlebitis    Past Surgical History:  Procedure Laterality Date   BREAST CYST ASPIRATION Left 20+ yrs ago   CARDIOVERSION N/A 11/11/2018   Procedure: CARDIOVERSION;  Surgeon: Nelva Bush, MD;  Location: [facility] ORS;  Service: Cardiovascular;  Laterality: N/A;   CATARACT EXTRACTION Bilateral 2005,2009   cyst removal-right shoulder  1972   otosclerosis Left 1988   hardening of bone, other 1989 redone   THYROIDECTOMY  1970   nodule and 1/2 bridge removal   WRIST SURGERY Left 11/29/2009    Allergies  Allergies  Allergen Reactions   Hydralazine Itching and Swelling   Biaxin [Clarithromycin] Nausea And Vomiting   Ciprofloxacin Other (See Comments)    Hand cramping    Meloxicam Other (See Comments)    Unknown   Minocycline Other (See Comments)    Unknown    History of Present Illness    85 year old woman with history of paroxysmal atrial fibrillation/flutter, complicated by persistent RVR, and with a history of difficulty controlling rates in the past due to underlying bradycardia when in sinus rhythm.  She also has a history of HTN and hyperlipidemia.    She had been scheduled for outpatient DCCV with Dr. Fletcher Anon on 7/27 2/2 symptomatic PAF; however, before the date of this outpatient cardioversion, she was admitted to [facility] 7/21-7/27 due to shortness of breath and hypoxia, as well as feeling of rapid HR. Her daughter, living with over the last month to assist with the patient's care, had reportedly brought the patient to the ED once she noted oxygen  saturation in the low 80s.  In the ED, she was noted to have elevated ventricular rates into the 140s and was placed on diltiazem infusion with some alleviation of dyspnea/SOB and chest pain; however, she continued to c/o feelings of her heart racing and corresponding chest discomfort. She underwent echocardiogram, demonstrating normal LVEF with moderate mitral and tricuspid regurgitation.  With continued symptoms and elevated rates, despite medication management, she underwent successful cardioversion on 7/22. Rate reducing medications, held before cardioversion 2/2 h/o bradycardia, were to be resumed as tolerated. Following cardioversion, however, telemetry showed normal sinus rhythm  SR with episodes atrial tachycardia  atrial flutter  atrial fibrillation with RVR. With amiodarone infusion, she converted back to NSR; however, she continued to complain of shortness with repeat chest x-ray showing persistent interstitial and alveolar edema and bilateral effusions (right greater than left). After successful IV diuresis, she was transitioned to oral lasix 60m qd and oral amiodarone at discharge with plan for amiodarone 400 mg twice daily for an additional 3 days then decrease to 200 mg twice daily until time of follow-up. She was also instructed to continue her current dose of diltiazem and metoprolol.  It was noted that she could possibly discontinue diltiazem in the near future. Discharge wt 54.7kg. Anticoagulation was continued with Eliquis 2.5 mg twice daily given her age and weight [CHA2DS2VASc score of at least  5 (CHF, HTN, age2, female)].  Today, 11/26/2018,  the patient presented with her daughter for hospital follow-up with both noting the patient's continued improvement since discharge. The patient stated she had been feeling well with no complaints of chest pain, palpitations, or racing heart rate since converting back to NSR.  EKG showed she remained in sinus rhythm rate of 63 bpm. She continued to use  oxygen at home with daughter noting concern that her mother still needed oxygen 24 hours a day versus only in the PM as before her most recent admission. She was followed by pulmonology with patient suspecting that her asthma may be contributing to her need for oxygen, given she felt better with nebulizer treatments. The patient did deny any increase in shortness of breath since discharge and as long as using oxygen 24h/day. She noted a dry and unproductive cough, which remained unchanged and ongoing for some time now and was not new for her. No c/o increased lower extremity edema, abdominal distention, or early satiety. She weighted herself daily and denied increased weight or any significant weight changes since discharge with 8/6 clinic weight 119 lbs (10/2018 discharge weight 126lbs). She was eating healthy with daughter confirming a low salt diet, sometimes even making homemade cheese and broccoli soups to avoid canned foods. Systolic blood pressure was documented as predominantly in the 120s, except for x1 episode of SBP ~140 (immediately after hospitalization) and  x1 of asymptomatic hypotension with BP 97/48 after taking all her evening medications. She reported continued medication compliance, including anticoagulation compliance with Eliquis at reduced 2.'5mg'$  dosing given her weight and age. She denied any signs or symptoms of bleeding including hematuria, hematochezia, or melena.  No feelings of presyncope or recent falls noted. Of note, during the appointment, she did report sudden fatigue; however, her daughter suspected this likely due to length of time spent outside the home.   Home Medications    Prior to Admission medications   Medication Sig Start Date End Date Taking? Authorizing Provider  albuterol (PROVENTIL HFA;VENTOLIN HFA) 108 (90 Base) MCG/ACT inhaler Inhale 2 puffs into the lungs every 6 (six) hours as needed for wheezing or shortness of breath. 07/08/17  Yes Ronnell Freshwater, NP    amiodarone (PACERONE) 200 MG tablet Take 1 tablet (200 mg total) by mouth daily. 11/26/18  Yes Dunn, Areta Haber, PA-C  apixaban (ELIQUIS) 2.5 MG TABS tablet Take 1 tablet (2.5 mg total) by mouth 2 (two) times daily. 11/05/18  Yes Wellington Hampshire, MD  Blood Glucose Monitoring Suppl (FREESTYLE FREEDOM LITE) w/Device KIT 1 kit by Does not apply route daily. 05/18/18  Yes Scarboro, Audie Clear, NP  denosumab (PROLIA) 60 MG/ML SOSY injection Inject 60 mg into the skin every 6 (six) months.   Yes [provider]  diltiazem (CARDIZEM CD) 120 MG 24 hr capsule Take 1 capsule (120 mg total) by mouth daily. 11/17/18  Yes Sudini, Alveta Heimlich, MD  fluticasone (FLONASE) 50 MCG/ACT nasal spray Place 1 spray into both nostrils daily as needed for allergies or rhinitis.   Yes [provider]  fluticasone (FLOVENT HFA) 110 MCG/ACT inhaler Inhale 2 puffs into the lungs 2 (two) times daily. 09/30/18  Yes Scarboro, Audie Clear, NP  furosemide (LASIX) 40 MG tablet Take 1 tablet (40 mg total) by mouth daily. 11/17/18  Yes Hillary Bow, MD  glucose blood (FREESTYLE LITE) test strip Use as directed once a daily Diag E11.65 05/19/18  Yes Boscia, Heather E, NP  ipratropium-albuterol (DUONEB) 0.5-2.5 (3) MG/3ML SOLN Take 3 mLs by nebulization 3 (three)  times daily as needed. Use 1 vial 3 times daily as needed for SOB dia j45.1 09/30/18 12/29/18 Yes Scarboro, Audie Clear, NP  Lancets (FREESTYLE) lancets Use as directed once daily diag e11.65 05/19/18  Yes Boscia, Greer Ee, NP  levothyroxine (SYNTHROID) 25 MCG tablet TAKE 1 DAILY BY MOUTH IN THE MORNING ON AN EMPTY STOMACH 11/16/18  Yes Boscia, Heather E, NP  metoprolol tartrate (LOPRESSOR) 100 MG tablet Take 1.5 tablets twice daily. Patient taking differently: Take 150 mg by mouth 2 (two) times daily. Take 1.5 tablets twice daily.  09/30/18  Yes Scarboro, Audie Clear, NP  mometasone (ELOCON) 0.1 % lotion Apply 4 drops topically at bedtime as needed for itching. 09/14/18  Yes [provider]   montelukast (SINGULAIR) 10 MG tablet TAKE 1 TABLET BY MOUTH EVERY DAY FOR COUGH Patient taking differently: Take 10 mg by mouth daily. TAKE 1 TABLET BY MOUTH EVERY DAY FOR COUGH 06/18/18  Yes Boscia, Greer Ee, NP  Multiple Vitamins-Minerals (MULTIVITAMIN WITH MINERALS) tablet Take 1 tablet by mouth daily.   Yes [provider]  OXYGEN Inhale 2 L into the lungs every evening. Sleep with nasal cannula w/ O2 at 2lpm.   Yes [provider]  potassium chloride SA (K-DUR) 10 MEQ tablet Take 1 tablet (10 mEq total) by mouth daily. 11/17/18  Yes Hillary Bow, MD  rosuvastatin (CRESTOR) 5 MG tablet Take 1 tablet (5 mg total) by mouth daily at 6 PM. 10/12/18  Yes Fritzi Mandes, MD  sertraline (ZOLOFT) 100 MG tablet Take 1 tablet (100 mg total) by mouth daily. 03/23/18  Yes Boscia, Greer Ee, NP  vitamin C (ASCORBIC ACID) 500 MG tablet Take 500 mg by mouth daily.   Yes [provider]  Respiratory Therapy Supplies (ONE FLOW SPIROMETER) DEVI Please use device as tolerated and according to instruction provided. 11/26/18   Rise Mu, PA-C    Review of Systems    No CP, palpitation, or racing HR. No SOB or DOE if using [place] oxygen. No recent presyncope or syncope. No s/sx of bleeding.  All other systems reviewed and are otherwise negative except as noted above.  Physical Exam    VS:  BP (!) 114/58 (BP Location: Right Arm, Patient Position: Sitting, Cuff Size: Normal)    Pulse 63    Ht 5' 2"  (1.575 m)    Wt 119 lb (54 kg)    SpO2 96%    BMI 21.77 kg/m  , BMI Body mass index is 21.77 kg/m. GEN: Frail and elderly female in no acute distress. HEENT: Normal.  On nasal cannula oxygen Neck: Supple, no JVD or masses.  Cardiac: RRR, no murmurs, rubs, or gallops. No clubbing, cyanosis, edema.  Radials/DP/PT 2+ and equal bilaterally.  Respiratory:  Respirations regular and unlabored.  Trace bibasilar crackles with R>L. GI: Soft, nontender, nondistended, BS + x 4. MS: no deformity or atrophy.  Minimal to trace dependent LEE.  Wearing compression stockings. Skin: warm and dry, no rash. Neuro:  Strength and sensation are intact. Psych: Normal affect.  Accessory Clinical Findings    ECG personally reviewed by me today -Sinus rhythm with first-degree AV block, 63 bpm, PRi 246m, QTC 425 ms, left axis deviation, no acute changes from previous EKG.  Assessment & Plan    Atrial fibrillation / flutter with rapid ventricular rate, s/p DCCV 7/22 -S/p recent 10/2018 admission with DCCV 7/22 due to symptomatic atrial fibrillation. Telemetry after DCCV showed NSR  atrial tachycardia, followed by atrial flutter, followed  by atrial fibrillation with RVR. Converted back to normal sinus rhythm with amiodarone infusion.  Transitioned to oral amiodarone 400 mg twice daily for 3 days then decrease to 200 mg twice daily. --Maintaining sinus rhythm with documented rates in the 60s. No CP, SOB, rapid HR reported. --Continue rate control with metoprolol and diltiazem. As previously documented, she may be able to discontinue her diltiazem at future follow-up.  --Continue anticoagulation with Eliquis 2.5 mg, given need for reduced dose due to age and weight. CHA2DS2VASc score of at least  5. CBC checked today and without abnormality. No s/sx of bleeding or recent falls or report of near syncope.  --Continue amiodarone at reduced dose of 200 mg daily.  Rechecked liver function with CMP today and stable.  Recent TSH borderline but not concerning- she remains on synthroid and TSH will continue to be monitored. Annual eye exam and PFT recommended with patient noting she is followed closely by pulmonology.  --Follow-up with primary cardiologist in 2 weeks, and if stable, can likely decrease follow-up to 3 or 73-monthbasis.   Diastolic heart failure (EF 55-60%) - No increased SOB or report of LEE with recent diagnosis of acute diastolic heart failure per most recent hospitalization, likely due to atrial fibrillation  with RVR.  Moderate pulmonary hypertension was also noted.  Echo with normal EF and RSVP 58.3 mmHg. --Physical exam today notable for trace bibasilar crackles, greater on right than left. Trace b/l LEE. Weight decrease since discharge from 126lb to 119lbs with consideration of differences between scales. Given her bibasilar crackles, and as daughter reported that pulmonology also recently reported they heard crackles on exam, CXR ordered for same day given her age and recent hospitalization. CXR without acute findings and showed continued improvement from discharge with possible right base pleural effusion versus ILD.   --Continue Lasix 40 mg daily with small dose potassium.  CMP performed today showed stable renal function and electrolytes.  SBP 114 today with patient any symptoms of presyncope or syncope and labs not consistent with AKI / dehydration. --Recommend patient continue to attempt weaning off of nasal cannula oxygen as tolerated and per any additional pulmonology recommendations.  Due to patient and daughter request today, patient received a printed script for a spirometer today to assist with patient's continued improvement / recovery after hospitalization. Instructions were provided regarding use. Continued increased activity as tolerated was recommended as well. --Follow-up with primary cardiologist in 2 weeks.   Disposition MEDICATIONS: Decrease amiodarone to 200 mg daily. LABS: Repeat CMP to assess renal function, liver function.  Repeat CBC given anticoagulation. Update Chest x-ray given bibasilar trace crackles. Update: labs stable, CXR improved from discharge with R lower base effusion v. ILD FOLLOW-UP: Dr. AFletcher Anonin 2 weeks.  -- JArvil Chaco PA-C 11/26/2018, 5:34 PM

## 2018-11-26 NOTE — Patient Instructions (Signed)
Medication Instructions:  Your physician has recommended you make the following change in your medication:  1- Decrease Amiodarone Take 1 tablet (200 mg total) by mouth daily.  If you need a refill on your cardiac medications before your next appointment, please call your pharmacy.   Lab work: Your physician recommends that you return for lab work today at [facility] mall. No appt is needed. Hours are M-F 7AM- 6 PM.  If you have labs (blood work) drawn today and your tests are completely normal, you will receive your results only by: Marland Kitchen MyChart Message (if you have MyChart) OR . A paper copy in the mail If you have any lab test that is abnormal or we need to change your treatment, we will call you to review the results.  Testing/Procedures: 1- Chest xray at the medical mall   2- Use incentive spirometer during commecial breaks or each time you go to the bathroom.   Follow-Up: At [facility], you and your health needs are our priority.  As part of our continuing mission to provide you with exceptional heart care, we have created designated Provider Care Teams.  These Care Teams include your primary Cardiologist (physician) and Advanced Practice Providers (APPs -  Physician Assistants and Nurse Practitioners) who all work together to provide you with the care you need, when you need it. You will need a follow up appointment in 2 weeks.  You may see Kathlyn Sacramento, MD or Marrianne Mood, PA-C.

## 2018-11-27 ENCOUNTER — Telehealth: Payer: Self-pay

## 2018-11-27 ENCOUNTER — Ambulatory Visit: Payer: Medicare Other | Admitting: Physician Assistant

## 2018-11-27 DIAGNOSIS — I110 Hypertensive heart disease with heart failure: Secondary | ICD-10-CM | POA: Diagnosis not present

## 2018-11-27 DIAGNOSIS — I480 Paroxysmal atrial fibrillation: Secondary | ICD-10-CM | POA: Diagnosis not present

## 2018-11-27 DIAGNOSIS — J918 Pleural effusion in other conditions classified elsewhere: Secondary | ICD-10-CM | POA: Diagnosis not present

## 2018-11-27 DIAGNOSIS — I0981 Rheumatic heart failure: Secondary | ICD-10-CM | POA: Diagnosis not present

## 2018-11-27 DIAGNOSIS — I5031 Acute diastolic (congestive) heart failure: Secondary | ICD-10-CM | POA: Diagnosis not present

## 2018-11-27 DIAGNOSIS — I501 Left ventricular failure: Secondary | ICD-10-CM | POA: Diagnosis not present

## 2018-11-27 NOTE — Telephone Encounter (Signed)
-----   Message from Arvil Chaco, [place] sent at 11/26/2018 11:40 PM EDT ----- Please let Ashley Weiss know that her chest x-ray results were very reassuring and showed continued improvement in her lungs since her last chest x-ray  (taken just before her discharge from the hospital). Her 8/6 images showed significant improvement in pulmonary edema and bilateral pleural effusions (findings that mean fluid accumulation and water on the lungs). Review of images showed likely trace right sided lower lung pleural effusion, which could certainly explain her faint crackles heard on exam, but which are not concerning enough for [place] to increase her water pill (lasix), especially given she has not increased in weight and did not report any increased SOB since discharge. I suspect that she will continue to improve on her current medication. Given her ongoing cough, and given her images, we will continue to reassess her symptoms at follow-up and also periodically check her lung function / images, which is done for everyone when on amiodarone. Please have her write down anything she notices worsens or improves her cough and keep track of her spirometry progress or any changes in oxygen use until her next follow-up. Continue to document her blood pressure and weights. If she notes any worsening in her breathing and/or increased leg swelling that is associated with weight gain over 5 lbs in 2 days despite taking her lasix, she should call the office. If her weight goes down more than 3 pounds from her normal (baseline) weight, especially if associated with low blood pressure and feeling of light-headedness or confusion, she should also let us know, as we may have her hold her lasix one day depending on her symptoms and vital signs.   Of note, her images also showed old right side posterior rib fractures (ribs at the right side of her back), which may explain the back discomfort that she felt while sitting in her chair during  our appointment. If she continues to experience discomfort, she should follow-up with her PCP for further assessment regarding management of this pain.

## 2018-11-27 NOTE — Telephone Encounter (Signed)
Visser, Jacquelyn D, PA-C  P Cv Div [facility] Triage        Please let Ms. Branan know that her lab results all came back fine. Her complete metabolic panel showed stable kidney function and electrolytes. She should continue her current dose of potassium with her lasix 40mg  daily. Her liver function is unchanged since starting her amiodarone, and she should continue to take her amiodarone at the reduced dose of amiodarone 200mg  daily as discussed in clinic on 8/6.   Her CBC / complete blood cell count was unremarkable (normal with no changes) and she should continue her anticoagulation (blood thinner) of Eliquis 2.5mg  twice a day.   Her labs today are very reassuring - please let us know if questions or concerns before her next follow-up.    Call to daughter Lelan Pons with lab and x ray results.   She verbalized understanding and in agreement with POC. Confirmed upcoming appt.   Advised pt to call for any further questions or concerns.

## 2018-11-30 DIAGNOSIS — I0981 Rheumatic heart failure: Secondary | ICD-10-CM | POA: Diagnosis not present

## 2018-11-30 DIAGNOSIS — J918 Pleural effusion in other conditions classified elsewhere: Secondary | ICD-10-CM | POA: Diagnosis not present

## 2018-11-30 DIAGNOSIS — I5031 Acute diastolic (congestive) heart failure: Secondary | ICD-10-CM | POA: Diagnosis not present

## 2018-11-30 DIAGNOSIS — I110 Hypertensive heart disease with heart failure: Secondary | ICD-10-CM | POA: Diagnosis not present

## 2018-11-30 DIAGNOSIS — I480 Paroxysmal atrial fibrillation: Secondary | ICD-10-CM | POA: Diagnosis not present

## 2018-11-30 DIAGNOSIS — I501 Left ventricular failure: Secondary | ICD-10-CM | POA: Diagnosis not present

## 2018-12-02 ENCOUNTER — Telehealth: Payer: Self-pay

## 2018-12-02 DIAGNOSIS — J918 Pleural effusion in other conditions classified elsewhere: Secondary | ICD-10-CM | POA: Diagnosis not present

## 2018-12-02 DIAGNOSIS — I5031 Acute diastolic (congestive) heart failure: Secondary | ICD-10-CM | POA: Diagnosis not present

## 2018-12-02 DIAGNOSIS — I480 Paroxysmal atrial fibrillation: Secondary | ICD-10-CM | POA: Diagnosis not present

## 2018-12-02 DIAGNOSIS — I110 Hypertensive heart disease with heart failure: Secondary | ICD-10-CM | POA: Diagnosis not present

## 2018-12-02 DIAGNOSIS — I501 Left ventricular failure: Secondary | ICD-10-CM | POA: Diagnosis not present

## 2018-12-02 DIAGNOSIS — I0981 Rheumatic heart failure: Secondary | ICD-10-CM | POA: Diagnosis not present

## 2018-12-02 NOTE — Telephone Encounter (Signed)
CMN SIGNED AND PLACED IN [employer] PAT[employer] FOLDER. °

## 2018-12-03 ENCOUNTER — Ambulatory Visit: Payer: Medicare Other | Admitting: Physician Assistant

## 2018-12-03 DIAGNOSIS — I480 Paroxysmal atrial fibrillation: Secondary | ICD-10-CM | POA: Diagnosis not present

## 2018-12-03 DIAGNOSIS — I5031 Acute diastolic (congestive) heart failure: Secondary | ICD-10-CM | POA: Diagnosis not present

## 2018-12-03 DIAGNOSIS — I110 Hypertensive heart disease with heart failure: Secondary | ICD-10-CM | POA: Diagnosis not present

## 2018-12-03 DIAGNOSIS — J918 Pleural effusion in other conditions classified elsewhere: Secondary | ICD-10-CM | POA: Diagnosis not present

## 2018-12-03 DIAGNOSIS — I0981 Rheumatic heart failure: Secondary | ICD-10-CM | POA: Diagnosis not present

## 2018-12-03 DIAGNOSIS — I501 Left ventricular failure: Secondary | ICD-10-CM | POA: Diagnosis not present

## 2018-12-04 ENCOUNTER — Other Ambulatory Visit: Payer: Self-pay | Admitting: Cardiovascular Disease

## 2018-12-04 ENCOUNTER — Other Ambulatory Visit: Payer: Self-pay | Admitting: Physician Assistant

## 2018-12-04 ENCOUNTER — Other Ambulatory Visit: Payer: Self-pay

## 2018-12-04 MED ORDER — DILTIAZEM HCL ER COATED BEADS 120 MG PO CP24: 120.0000 mg | ORAL_CAPSULE | Freq: Every day | ORAL | 0 refills | Status: DC

## 2018-12-04 MED ORDER — POTASSIUM CHLORIDE CRYS ER 10 MEQ PO TBCR: 10.0000 meq | EXTENDED_RELEASE_TABLET | Freq: Every day | ORAL | 0 refills | Status: DC

## 2018-12-04 MED ORDER — AMIODARONE HCL 200 MG PO TABS: 200.0000 mg | ORAL_TABLET | Freq: Every day | ORAL | 0 refills | Status: DC

## 2018-12-04 MED ORDER — FUROSEMIDE 40 MG PO TABS: 40.0000 mg | ORAL_TABLET | Freq: Every day | ORAL | 0 refills | Status: DC

## 2018-12-04 MED ORDER — ROSUVASTATIN CALCIUM 5 MG PO TABS: 5.0000 mg | ORAL_TABLET | Freq: Every day | ORAL | 0 refills | Status: DC

## 2018-12-04 MED ORDER — APIXABAN 2.5 MG PO TABS: 2.5000 mg | ORAL_TABLET | Freq: Two times a day (BID) | ORAL | 5 refills | Status: DC

## 2018-12-04 NOTE — Telephone Encounter (Signed)
Last OV 11/26/2018 Scr 0.57 on 11/26/2018 85 years old 54 kg Eliquis 2.5mg  BID sent to pharmacy

## 2018-12-04 NOTE — Telephone Encounter (Signed)
Requested Prescriptions   Signed Prescriptions Disp Refills  . amiodarone (PACERONE) 200 MG tablet 30 tablet 0    Sig: Take 1 tablet (200 mg total) by mouth daily.    Authorizing Provider: Kathlyn Sacramento A    Ordering User: Janan Ridge diltiazem (CARDIZEM CD) 120 MG 24 hr capsule 30 capsule 0    Sig: Take 1 capsule (120 mg total) by mouth daily.    Authorizing Provider: Kathlyn Sacramento A    Ordering User: Janan Ridge furosemide (LASIX) 40 MG tablet 30 tablet 0    Sig: Take 1 tablet (40 mg total) by mouth daily.    Authorizing Provider: Kathlyn Sacramento A    Ordering User: Janan Ridge potassium chloride (K-DUR) 10 MEQ tablet 30 tablet 0    Sig: Take 1 tablet (10 mEq total) by mouth daily.    Authorizing Provider: Kathlyn Sacramento A    Ordering User: Janan Ridge rosuvastatin (CRESTOR) 5 MG tablet 30 tablet 0    Sig: Take 1 tablet (5 mg total) by mouth daily at 6 PM.    Authorizing Provider: Kathlyn Sacramento A    Ordering User: Janan Ridge

## 2018-12-04 NOTE — Telephone Encounter (Signed)
Please review for Eliquis refill, Thanks.     Requested Prescriptions   Signed Prescriptions Disp Refills  . amiodarone (PACERONE) 200 MG tablet 30 tablet 0    Sig: Take 1 tablet (200 mg total) by mouth daily.    Authorizing Provider: Kathlyn Sacramento A    Ordering User: Janan Ridge diltiazem (CARDIZEM CD) 120 MG 24 hr capsule 30 capsule 0    Sig: Take 1 capsule (120 mg total) by mouth daily.    Authorizing Provider: Kathlyn Sacramento A    Ordering User: Janan Ridge furosemide (LASIX) 40 MG tablet 30 tablet 0    Sig: Take 1 tablet (40 mg total) by mouth daily.    Authorizing Provider: Kathlyn Sacramento A    Ordering User: Janan Ridge potassium chloride (K-DUR) 10 MEQ tablet 30 tablet 0    Sig: Take 1 tablet (10 mEq total) by mouth daily.    Authorizing Provider: Kathlyn Sacramento A    Ordering User: Janan Ridge rosuvastatin (CRESTOR) 5 MG tablet 30 tablet 0    Sig: Take 1 tablet (5 mg total) by mouth daily at 6 PM.    Authorizing Provider: Kathlyn Sacramento A    Ordering User: Janan Ridge

## 2018-12-04 NOTE — Telephone Encounter (Signed)
°*  STAT* If patient is at the pharmacy, call can be transferred to refill team.   1. Which medications need to be refilled? (please list name of each medication and dose if known)     Amiodarone 200 mg po q d     Potassium  10 meq p q d     Diltiazem 120 mg po q d     Furosemide 40 mg po q d     rosuvastatin 5 mg po q d     Eliquis 2.5 mg po BID            2. Which pharmacy/location (including street and city if local pharmacy) is medication to be sent to?   [facility] dr. Lorina Rabon   3. Do they need a 30 day or 90 day supply? [place]

## 2018-12-07 NOTE — Progress Notes (Signed)
Cardiology Office Note    Date:  12/10/2018   ID:  Josepha, Barbier 1919-05-26, MRN [id number]  PCP:  Lavera Guise, MD  Cardiologist:  Kathlyn Sacramento, MD  Electrophysiologist:  None   Chief Complaint: Follow up  History of Present Illness:   Ashley Weiss is a 85 y.o. female with history of persistent atrial fibrillation/flutter on Eliquis and amiodarone status post cardioversion in 10/2018, possible tachybrady syndrome, chronic hypoxic respiratory failure secondary to possible ILD, hyperlipidemia, hypertension, and prior DVT who presents for follow-up of A. fib.  Patient reports a history of A. fib for numerous years with intermittent palpitations.  She was admitted to [facility] in 09/2018 with dizziness and weakness and was found to be in A. fib with RVR with ventricular rate of 146 bpm.  She was rate controlled with metoprolol, diltiazem, and digoxin.  Echo showed normal LV systolic function, moderately dilated left atrium, mild aortic stenosis, mild to moderate tricuspid regurgitation, and mild pulmonary hypertension.  In outpatient follow-up, digoxin and Benicar were subsequently discontinued with titration of diltiazem.  She was seen on 11/05/2018 and remained in A. fib with RVR with plans for outpatient cardioversion.  However, there was some concern regarding her baseline bradycardic heart rates, which prevented escalation of rate control therapy at that time.  Prior to the patient undergoing this outpatient cardioversion she presented to the hospital on 7/21 with increased shortness of breath, hypoxia, and palpitations.  She remained in A. fib with RVR with ventricular rates in the 140s bpm.  She underwent repeat echo which demonstrated normal LV systolic function with moderate mitral and tricuspid regurgitation.  Ventricular rates were difficult to control leading to successful cardioversion on 7/22.  However, while she was still admitted she developed atrial tachycardia followed by atrial  flutter with subsequent development of A. fib with RVR.  She was pharmacologically cardioverted with amiodarone infusion.  Her shortness of breath improved with restoration of sinus rhythm and IV diuresis prior to discharge.  She was seen in hospital follow-up on 11/26/2018 and was feeling well with stable weight of approximately 126 pounds.  The patient did remain on supplemental oxygen 24/7 (previously on nocturnal oxygen).  She was maintaining sinus rhythm.  Given shortness of breath, need for continued oxygen, and physical exam findings patient underwent stat chest x-ray following that visit which showed no acute cardiopulmonary disease with chronic cardiomegaly and aortic atherosclerosis.  She was continued on Lasix 40 mg daily.  Patient comes in accompanied by her daughter today and is doing quite well.  She denies any chest pain, increased shortness of breath, palpitations, dizziness, presyncope, or syncope.  No lower extremity swelling, abdominal surgery, orthopnea, PND, early satiety.  No falls, BRBPR, or melena.  Her weight has been stable around 119 to 120 pounds.  She is compliant with all medications.  She is using her incentive spirometer intermittently.  She remains on supplemental oxygen 24/7.  She is no longer using her albuterol nebulizer on a scheduled basis with improvement in breathing.  Energy and balance continue to improve.  Labs: 11/2018 -potassium 4.0, serum creatinine 0.57, albumin 3.5, AST/ALT normal, Hgb 13.0, PLT 255 10/2018 -total cholesterol 79, triglyceride 34, HDL 30, LDL 42, BNP 283, TSH normal, magnesium 2.1  Past Medical History:  Diagnosis Date   Arthritis    Asthma    Cancer ([place])    skin   Depression    DVT (deep venous thrombosis) (HCC)    Hyperlipidemia  Hypertension    Phlebitis     Past Surgical History:  Procedure Laterality Date   BREAST CYST ASPIRATION Left 20+ yrs ago   CARDIOVERSION N/A 11/11/2018   Procedure: CARDIOVERSION;  Surgeon:  Nelva Bush, MD;  Location: [facility] ORS;  Service: Cardiovascular;  Laterality: N/A;   CATARACT EXTRACTION Bilateral 2005,2009   cyst removal-right shoulder  1972   otosclerosis Left 1988   hardening of bone, other 1989 redone   THYROIDECTOMY  1970   nodule and 1/2 bridge removal   WRIST SURGERY Left 11/29/2009    Current Medications: Current Meds  Medication Sig   albuterol (PROVENTIL HFA;VENTOLIN HFA) 108 (90 Base) MCG/ACT inhaler Inhale 2 puffs into the lungs every 6 (six) hours as needed for wheezing or shortness of breath.   amiodarone (PACERONE) 200 MG tablet Take 1 tablet (200 mg total) by mouth daily.   apixaban (ELIQUIS) 2.5 MG TABS tablet Take 1 tablet (2.5 mg total) by mouth 2 (two) times daily.   Blood Glucose Monitoring Suppl (FREESTYLE FREEDOM LITE) w/Device KIT 1 kit by Does not apply route daily.   denosumab (PROLIA) 60 MG/ML SOSY injection Inject 60 mg into the skin every 6 (six) months.   diltiazem (CARDIZEM CD) 120 MG 24 hr capsule Take 1 capsule (120 mg total) by mouth daily.   fluticasone (FLONASE) 50 MCG/ACT nasal spray Place 1 spray into both nostrils daily as needed for allergies or rhinitis.   fluticasone (FLOVENT HFA) 110 MCG/ACT inhaler Inhale 2 puffs into the lungs 2 (two) times daily.   furosemide (LASIX) 40 MG tablet Take 1 tablet (40 mg total) by mouth daily.   glucose blood (FREESTYLE LITE) test strip Use as directed once a daily Diag E11.65   ipratropium-albuterol (DUONEB) 0.5-2.5 (3) MG/3ML SOLN Take 3 mLs by nebulization 3 (three) times daily as needed. Use 1 vial 3 times daily as needed for SOB dia j45.1   Lancets (FREESTYLE) lancets Use as directed once daily diag e11.65   levothyroxine (SYNTHROID) 25 MCG tablet TAKE 1 DAILY BY MOUTH IN THE MORNING ON AN EMPTY STOMACH   metoprolol tartrate (LOPRESSOR) 100 MG tablet Take 1.5 tablets twice daily. (Patient taking differently: Take 150 mg by mouth 2 (two) times daily. Take 1.5 tablets  twice daily. )   mometasone (ELOCON) 0.1 % lotion Apply 4 drops topically at bedtime as needed for itching.   montelukast (SINGULAIR) 10 MG tablet TAKE 1 TABLET BY MOUTH EVERY DAY FOR COUGH (Patient taking differently: Take 10 mg by mouth daily. TAKE 1 TABLET BY MOUTH EVERY DAY FOR COUGH)   Multiple Vitamins-Minerals (MULTIVITAMIN WITH MINERALS) tablet Take 1 tablet by mouth daily.   OXYGEN Inhale 2 L into the lungs every evening. Sleep with nasal cannula w/ O2 at 2lpm.   potassium chloride (K-DUR) 10 MEQ tablet Take 1 tablet (10 mEq total) by mouth daily.   Respiratory Therapy Supplies (ONE FLOW SPIROMETER) DEVI Please use device as tolerated and according to instruction provided.   rosuvastatin (CRESTOR) 5 MG tablet Take 1 tablet (5 mg total) by mouth daily at 6 PM.   sertraline (ZOLOFT) 100 MG tablet Take 1 tablet (100 mg total) by mouth daily.   vitamin C (ASCORBIC ACID) 500 MG tablet Take 500 mg by mouth daily.     Allergies:   Hydralazine, Biaxin [clarithromycin], Ciprofloxacin, Meloxicam, and Minocycline   Social History   Socioeconomic History   Marital status: Widowed    Spouse name: Not on file   Number of children:  Not on file   Years of education: Not on file   Highest education level: Not on file  Occupational History   Not on file  Social Needs   Financial resource strain: Not on file   Food insecurity    Worry: Not on file    Inability: Not on file   Transportation needs    Medical: Not on file    Non-medical: Not on file  Tobacco Use   Smoking status: Never Smoker   Smokeless tobacco: Never Used  Substance and Sexual Activity   Alcohol use: No   Drug use: No   Sexual activity: Never  Lifestyle   Physical activity    Days per week: Not on file    Minutes per session: Not on file   Stress: Not on file  Relationships   Social connections    Talks on phone: Not on file    Gets together: Not on file    Attends religious service: Not  on file    Active member of club or organization: Not on file    Attends meetings of clubs or organizations: Not on file    Relationship status: Not on file  Other Topics Concern   Not on file  Social History Narrative   Not on file     Family History:  The patient's family history includes Depression in her father; Osteoarthritis in her mother. There is no history of Breast cancer.  ROS:   Review of Systems  Constitutional: Positive for malaise/fatigue. Negative for chills, diaphoresis, fever and weight loss.  HENT: Negative for congestion.   Eyes: Negative for discharge and redness.  Respiratory: Positive for shortness of breath. Negative for cough, hemoptysis, sputum production and wheezing.        Improved shortness of breath  Cardiovascular: Negative for chest pain, palpitations, orthopnea, claudication, leg swelling and PND.  Gastrointestinal: Negative for abdominal pain, blood in stool, heartburn, melena, nausea and vomiting.  Genitourinary: Negative for hematuria.  Musculoskeletal: Negative for falls and myalgias.  Skin: Negative for rash.  Neurological: Negative for dizziness, tingling, tremors, sensory change, speech change, focal weakness, loss of consciousness and weakness.  Endo/Heme/Allergies: Does not bruise/bleed easily.  Psychiatric/Behavioral: Negative for substance abuse. The patient is not nervous/anxious.   All other systems reviewed and are negative.    EKGs/Labs/Other Studies Reviewed:    Studies reviewed were summarized above. The additional studies were reviewed today:  2D Echo 11/11/2018: 1. The left ventricle has normal systolic function, with an ejection fraction of 55-60%. The cavity size was normal. Left ventricular diastolic Doppler parameters are consistent with restrictive filling. Elevated mean left atrial pressure No evidence of  left ventricular regional wall motion abnormalities.  2. The right ventricle has mildly reduced systolic function.  The cavity was normal. There is no increase in right ventricular wall thickness. Right ventricular systolic pressure is moderately elevated with an estimated pressure of 58.3 mmHg.  3. Left atrial size was moderately dilated.  4. Right atrial size was mildly dilated.  5. The mitral valve is degenerative. Mild thickening of the mitral valve leaflet. There is mild mitral annular calcification present. Mitral valve regurgitation is moderate by color flow Doppler.  6. Tricuspid valve regurgitation is moderate.  7. The aortic valve is tricuspid. Moderate thickening of the aortic valve. Sclerosis without any evidence of stenosis of the aortic valve. Aortic valve regurgitation is trivial by color flow Doppler.  8. The aorta is normal in size and structure.  9. The  inferior vena cava was normal in size with <50% respiratory variability.  EKG:  EKG is ordered today.  The EKG ordered today demonstrates NSR, 62 bpm, first-degree AV block, normal axis, no acute ST-T changes  Recent Labs: 11/10/2018: Magnesium 2.1 11/11/2018: B Natriuretic Peptide 283.0; TSH 4.231 11/26/2018: ALT 25; BUN 19; Creatinine, Ser 0.57; Hemoglobin 13.0; Platelets 255; Potassium 4.0; Sodium 136  Recent Lipid Panel    Component Value Date/Time   CHOL 79 11/11/2018 0317   CHOL 119 10/09/2018 0848   TRIG 34 11/11/2018 0317   HDL 30 (L) 11/11/2018 0317   HDL 38 (L) 10/09/2018 0848   CHOLHDL 2.6 11/11/2018 0317   VLDL 7 11/11/2018 0317   LDLCALC 42 11/11/2018 0317   LDLCALC 57 10/09/2018 0848    PHYSICAL EXAM:    VS:  BP 118/62 (BP Location: Left Arm, Patient Position: Sitting, Cuff Size: Normal)    Pulse 62    Ht 5' 2"  (1.575 m)    Wt 123 lb (55.8 kg)    BMI 22.50 kg/m   BMI: Body mass index is 22.5 kg/m.  Physical Exam  Constitutional: She is oriented to person, place, and time. She appears well-developed and well-nourished.  HENT:  Head: Normocephalic and atraumatic.  Eyes: Right eye exhibits no discharge. Left eye  exhibits no discharge.  Neck: Normal range of motion. No JVD present.  Cardiovascular: Normal rate, regular rhythm, S1 normal and S2 normal. Exam reveals no distant heart sounds, no friction rub, no midsystolic click and no opening snap.  Murmur heard. High-pitched blowing holosystolic murmur is present with a grade of 2/6 at the apex. Pulses:      Posterior tibial pulses are 2+ on the right side and 2+ on the left side.  Compression stocking noted along the right lower extremity  Pulmonary/Chest: Effort normal and breath sounds normal. No respiratory distress. She has no decreased breath sounds. She has no wheezes. She has no rales. She exhibits no tenderness.  Abdominal: Soft. She exhibits no distension. There is no abdominal tenderness.  Musculoskeletal:        General: No edema.  Neurological: She is alert and oriented to person, place, and time.  Skin: Skin is warm and dry. No cyanosis. Nails show no clubbing.  Psychiatric: She has a normal mood and affect. Her speech is normal and behavior is normal. Judgment and thought content normal.    Wt Readings from Last 3 Encounters:  12/10/18 123 lb (55.8 kg)  11/26/18 119 lb (54 kg)  11/19/18 119 lb 12.8 oz (54.3 kg)     ASSESSMENT & PLAN:   1. Persistent A. Fib/flutter: Maintaining sinus rhythm with heart rate in the 60s bpm.  Continue rate control with Cardizem CD and Lopressor.  Continue amiodarone 200 mg daily for rhythm control.  Given concern for possible ILD on chest x-ray earlier this month I have asked the patient's daughter to contact the patient's pulmonologist for further follow-up of this and for input regarding continuation of amiodarone.  Continue Eliquis 2.5 mg twice daily given she meets reduced dosing criteria with age and weight.  CBC from earlier this month demonstrated a stable hemoglobin of 13.0.  2. HFpEF: She is appears euvolemic and well compensated.  Weight is stable at home though it is up 4 pounds today when  compared to visit earlier this month.  We will recheck a BMP today.  Continue current dose of Lasix 40 mg daily.  3. Chronic hypoxic respiratory failure: Concern for interstitial  lung disease based on prior providers physical exam findings earlier this month as well as chest x-ray obtained at that time.  Given this finding and in the setting the patient being on amiodarone, I recommend referral to pulmonology for further evaluation and input on whether amiodarone should be continued or discontinued.  4. Moderate mitral regurgitation: Asymptomatic.  Unlikely to be a candidate for invasive procedure given advanced age.  Can monitor clinically.  5. HTN: Blood pressure is well controlled today.  Continue current medications including Cardizem CD, Lasix, and Lopressor.  6. HLD: LDL of 42 from 10/2018.  Remains on Crestor 5 mg daily.  Disposition: F/u with Dr. Fletcher Anon or an APP in 2 months.   Medication Adjustments/Labs and Tests Ordered: Current medicines are reviewed at length with the patient today.  Concerns regarding medicines are outlined above. Medication changes, Labs and Tests ordered today are summarized above and listed in the Patient Instructions accessible in Encounters.   Signed, Christell Faith, PA-C 12/10/2018 8:32 AM     [facility] [address] [place], [place] [address] [phone]

## 2018-12-08 ENCOUNTER — Telehealth: Payer: Self-pay

## 2018-12-08 NOTE — Telephone Encounter (Signed)
CMN SIGNED AND PLACED IN [facility] FOLDER

## 2018-12-08 NOTE — Telephone Encounter (Signed)
Pt daughter called this morning that its ok to used neb as needed and how often spirometer to used as per dr Humphrey Rolls advised its ok to used neb as needed due to lung clear and also spirometer used 2 daily and also she can discussed with Physical therapy who advised her to get spirometer

## 2018-12-10 ENCOUNTER — Ambulatory Visit: Payer: Medicare Other | Admitting: Physician Assistant

## 2018-12-10 ENCOUNTER — Encounter

## 2018-12-10 ENCOUNTER — Other Ambulatory Visit: Payer: Self-pay

## 2018-12-10 ENCOUNTER — Ambulatory Visit (INDEPENDENT_AMBULATORY_CARE_PROVIDER_SITE_OTHER): Payer: Medicare Other | Admitting: Physician Assistant

## 2018-12-10 ENCOUNTER — Encounter: Payer: Self-pay | Admitting: Physician Assistant

## 2018-12-10 VITALS — BP 118/62 | HR 62 | Ht 62.0 in | Wt 123.0 lb

## 2018-12-10 DIAGNOSIS — I5032 Chronic diastolic (congestive) heart failure: Secondary | ICD-10-CM | POA: Diagnosis not present

## 2018-12-10 DIAGNOSIS — I501 Left ventricular failure: Secondary | ICD-10-CM | POA: Diagnosis not present

## 2018-12-10 DIAGNOSIS — I4892 Unspecified atrial flutter: Secondary | ICD-10-CM

## 2018-12-10 DIAGNOSIS — I110 Hypertensive heart disease with heart failure: Secondary | ICD-10-CM | POA: Diagnosis not present

## 2018-12-10 DIAGNOSIS — I480 Paroxysmal atrial fibrillation: Secondary | ICD-10-CM | POA: Diagnosis not present

## 2018-12-10 DIAGNOSIS — I4819 Other persistent atrial fibrillation: Secondary | ICD-10-CM

## 2018-12-10 DIAGNOSIS — J9611 Chronic respiratory failure with hypoxia: Secondary | ICD-10-CM | POA: Diagnosis not present

## 2018-12-10 DIAGNOSIS — E782 Mixed hyperlipidemia: Secondary | ICD-10-CM | POA: Diagnosis not present

## 2018-12-10 DIAGNOSIS — I340 Nonrheumatic mitral (valve) insufficiency: Secondary | ICD-10-CM | POA: Diagnosis not present

## 2018-12-10 DIAGNOSIS — I5031 Acute diastolic (congestive) heart failure: Secondary | ICD-10-CM | POA: Diagnosis not present

## 2018-12-10 DIAGNOSIS — I0981 Rheumatic heart failure: Secondary | ICD-10-CM | POA: Diagnosis not present

## 2018-12-10 DIAGNOSIS — I10 Essential (primary) hypertension: Secondary | ICD-10-CM

## 2018-12-10 DIAGNOSIS — I6523 Occlusion and stenosis of bilateral carotid arteries: Secondary | ICD-10-CM

## 2018-12-10 DIAGNOSIS — J918 Pleural effusion in other conditions classified elsewhere: Secondary | ICD-10-CM | POA: Diagnosis not present

## 2018-12-10 NOTE — Patient Instructions (Signed)
Medication Instructions:  Your physician recommends that you continue on your current medications as directed. Please refer to the Current Medication list given to you today.  If you need a refill on your cardiac medications before your next appointment, please call your pharmacy.   Lab work: Your physician recommends that you return for lab work today at the medical mall. ([facility]) No appt is needed. Hours are M-F 7AM- 6 PM.  If you have labs (blood work) drawn today and your tests are completely normal, you will receive your results only by: Marland Kitchen MyChart Message (if you have MyChart) OR . A paper copy in the mail If you have any lab test that is abnormal or we need to change your treatment, we will call you to review the results.  Testing/Procedures: None ordered   Follow-Up: At [facility], you and your health needs are our priority.  As part of our continuing mission to provide you with exceptional heart care, we have created designated Provider Care Teams.  These Care Teams include your primary Cardiologist (physician) and Advanced Practice Providers (APPs -  Physician Assistants and Nurse Practitioners) who all work together to provide you with the care you need, when you need it. You will need a follow up appointment in 2 months.   You may see Kathlyn Sacramento, MD or Christell Faith, PA-C.

## 2018-12-11 ENCOUNTER — Other Ambulatory Visit: Payer: Self-pay

## 2018-12-11 ENCOUNTER — Telehealth: Payer: Self-pay | Admitting: Physician Assistant

## 2018-12-11 LAB — BASIC METABOLIC PANEL
BUN/Creatinine Ratio: 29 — ABNORMAL HIGH (ref 12–28)
BUN: 16 mg/dL (ref 10–36)
CO2: 28 mmol/L (ref 20–29)
Calcium: 9.3 mg/dL (ref 8.7–10.3)
Chloride: 94 mmol/L — ABNORMAL LOW (ref 96–106)
Creatinine, Ser: 0.55 mg/dL — ABNORMAL LOW (ref 0.57–1.00)
GFR calc Af Amer: 92 mL/min/{1.73_m2} (ref >59)
GFR calc non Af Amer: 79 mL/min/{1.73_m2} (ref >59)
Glucose: 140 mg/dL — ABNORMAL HIGH (ref 65–99)
Potassium: 4.3 mmol/L (ref 3.5–5.2)
Sodium: 140 mmol/L (ref 134–144)

## 2018-12-11 MED ORDER — POTASSIUM CHLORIDE CRYS ER 10 MEQ PO TBCR: EXTENDED_RELEASE_TABLET | ORAL | Status: DC

## 2018-12-11 MED ORDER — MONTELUKAST SODIUM 10 MG PO TABS: ORAL_TABLET | ORAL | 1 refills | Status: DC

## 2018-12-11 MED ORDER — FUROSEMIDE 40 MG PO TABS: ORAL_TABLET | ORAL | Status: DC

## 2018-12-11 NOTE — Telephone Encounter (Signed)
I spoke with the patient's daughter's [name] & Lelan Pons regarding the patient's lab results and recommendations from [place]. [name] was driving and had me call Lelan Pons at [phone] to discuss with her further as she was at the patient's home and could write the new instructions down.  All instructions from [place], [place] were reviewed with Lelan Pons and she read them all back to me.  I also clarified with Thurmond Butts, PA that we will decrease the patient's potassium 10 meq once daily to PRN on the days she takes lasix.  Lelan Pons made aware and voiced understanding.   All other questions were answered to the best of my ability.

## 2018-12-11 NOTE — Telephone Encounter (Signed)
Notes recorded by Rise Mu, PA-C on 12/11/2018 at 7:15 AM EDT  Renal function is stable.  Potassium is at goal.  Now that she is maintaining sinus rhythm, change Lasix to 40 mg prn weight gain > 3 pounds overnight, 5 pounds in a week, increased lower extremity swelling or increased SOB.

## 2018-12-14 ENCOUNTER — Other Ambulatory Visit: Payer: Self-pay | Admitting: Adult Health

## 2018-12-16 DIAGNOSIS — I501 Left ventricular failure: Secondary | ICD-10-CM | POA: Diagnosis not present

## 2018-12-16 DIAGNOSIS — I480 Paroxysmal atrial fibrillation: Secondary | ICD-10-CM | POA: Diagnosis not present

## 2018-12-16 DIAGNOSIS — J918 Pleural effusion in other conditions classified elsewhere: Secondary | ICD-10-CM | POA: Diagnosis not present

## 2018-12-16 DIAGNOSIS — I110 Hypertensive heart disease with heart failure: Secondary | ICD-10-CM | POA: Diagnosis not present

## 2018-12-16 DIAGNOSIS — I0981 Rheumatic heart failure: Secondary | ICD-10-CM | POA: Diagnosis not present

## 2018-12-16 DIAGNOSIS — I5031 Acute diastolic (congestive) heart failure: Secondary | ICD-10-CM | POA: Diagnosis not present

## 2018-12-17 ENCOUNTER — Ambulatory Visit: Payer: Self-pay | Admitting: Internal Medicine

## 2018-12-17 ENCOUNTER — Encounter: Payer: Self-pay | Admitting: Internal Medicine

## 2018-12-17 ENCOUNTER — Ambulatory Visit (INDEPENDENT_AMBULATORY_CARE_PROVIDER_SITE_OTHER): Payer: Medicare Other | Admitting: Internal Medicine

## 2018-12-17 VITALS — BP 118/78 | HR 60 | Resp 16 | Ht 62.0 in | Wt 120.4 lb

## 2018-12-17 DIAGNOSIS — I480 Paroxysmal atrial fibrillation: Secondary | ICD-10-CM | POA: Diagnosis not present

## 2018-12-17 DIAGNOSIS — R0602 Shortness of breath: Secondary | ICD-10-CM

## 2018-12-17 DIAGNOSIS — Z9181 History of falling: Secondary | ICD-10-CM | POA: Diagnosis not present

## 2018-12-17 DIAGNOSIS — I272 Pulmonary hypertension, unspecified: Secondary | ICD-10-CM | POA: Diagnosis not present

## 2018-12-17 DIAGNOSIS — I5030 Unspecified diastolic (congestive) heart failure: Secondary | ICD-10-CM | POA: Diagnosis not present

## 2018-12-17 DIAGNOSIS — I6523 Occlusion and stenosis of bilateral carotid arteries: Secondary | ICD-10-CM

## 2018-12-17 DIAGNOSIS — J449 Chronic obstructive pulmonary disease, unspecified: Secondary | ICD-10-CM

## 2018-12-17 DIAGNOSIS — Z7901 Long term (current) use of anticoagulants: Secondary | ICD-10-CM | POA: Diagnosis not present

## 2018-12-17 DIAGNOSIS — R7303 Prediabetes: Secondary | ICD-10-CM | POA: Diagnosis not present

## 2018-12-17 DIAGNOSIS — I501 Left ventricular failure: Secondary | ICD-10-CM | POA: Diagnosis not present

## 2018-12-17 DIAGNOSIS — Z85828 Personal history of other malignant neoplasm of skin: Secondary | ICD-10-CM | POA: Diagnosis not present

## 2018-12-17 DIAGNOSIS — I5031 Acute diastolic (congestive) heart failure: Secondary | ICD-10-CM | POA: Diagnosis not present

## 2018-12-17 DIAGNOSIS — I081 Rheumatic disorders of both mitral and tricuspid valves: Secondary | ICD-10-CM | POA: Diagnosis not present

## 2018-12-17 DIAGNOSIS — J9610 Chronic respiratory failure, unspecified whether with hypoxia or hypercapnia: Secondary | ICD-10-CM | POA: Diagnosis not present

## 2018-12-17 DIAGNOSIS — H9190 Unspecified hearing loss, unspecified ear: Secondary | ICD-10-CM | POA: Diagnosis not present

## 2018-12-17 DIAGNOSIS — J918 Pleural effusion in other conditions classified elsewhere: Secondary | ICD-10-CM | POA: Diagnosis not present

## 2018-12-17 DIAGNOSIS — I110 Hypertensive heart disease with heart failure: Secondary | ICD-10-CM | POA: Diagnosis not present

## 2018-12-17 DIAGNOSIS — Z9981 Dependence on supplemental oxygen: Secondary | ICD-10-CM | POA: Diagnosis not present

## 2018-12-17 DIAGNOSIS — I0981 Rheumatic heart failure: Secondary | ICD-10-CM | POA: Diagnosis not present

## 2018-12-17 DIAGNOSIS — Z602 Problems related to living alone: Secondary | ICD-10-CM | POA: Diagnosis not present

## 2018-12-17 DIAGNOSIS — Z86718 Personal history of other venous thrombosis and embolism: Secondary | ICD-10-CM | POA: Diagnosis not present

## 2018-12-17 DIAGNOSIS — F329 Major depressive disorder, single episode, unspecified: Secondary | ICD-10-CM | POA: Diagnosis not present

## 2018-12-17 DIAGNOSIS — M479 Spondylosis, unspecified: Secondary | ICD-10-CM | POA: Diagnosis not present

## 2018-12-17 DIAGNOSIS — E785 Hyperlipidemia, unspecified: Secondary | ICD-10-CM | POA: Diagnosis not present

## 2018-12-17 NOTE — Progress Notes (Signed)
[facility] [place], [place] [address]  Pulmonary Sleep Medicine   Office Visit Note  Patient Name: Ashley Weiss DOB: 1919/12/19 MRN [id number]  Date of Service: 12/17/2018  Complaints/HPI: Patient is here for follow-up.she was seen by cardiology there was some concern whether amiodarone may be causing any toxicity.  I actually reviewed her chest x-ray which looks better than the last film from July.  The patient home has some shortness of breath but it is basically her baseline.  She has not had a pulmonary function done in a while and I think a DLCO would be helpful to determine if there is any issues related to the amiodarone.  No admissions to the hospital no chest pain no cough she does have some shortness of breath is already mentioned.  ROS  General: (-) fever, (-) chills, (-) night sweats, (-) weakness Skin: (-) rashes, (-) itching,. Eyes: (-) visual changes, (-) redness, (-) itching. Nose and Sinuses: (-) nasal stuffiness or itchiness, (-) postnasal drip, (-) nosebleeds, (-) sinus trouble. Mouth and Throat: (-) sore throat, (-) hoarseness. Neck: (-) swollen glands, (-) enlarged thyroid, (-) neck pain. Respiratory: - cough, (-) bloody sputum, + shortness of breath, - wheezing. Cardiovascular: - ankle swelling, (-) chest pain. Lymphatic: (-) lymph node enlargement. Neurologic: (-) numbness, (-) tingling. Psychiatric: (-) anxiety, (-) depression   Current Medication: Outpatient Encounter Medications as of 12/17/2018  Medication Sig  . albuterol (PROVENTIL HFA;VENTOLIN HFA) 108 (90 Base) MCG/ACT inhaler Inhale 2 puffs into the lungs every 6 (six) hours as needed for wheezing or shortness of breath.  Marland Kitchen amiodarone (PACERONE) 200 MG tablet Take 1 tablet (200 mg total) by mouth daily.  Marland Kitchen apixaban (ELIQUIS) 2.5 MG TABS tablet Take 1 tablet (2.5 mg total) by mouth 2 (two) times daily.  . Blood Glucose Monitoring Suppl (FREESTYLE FREEDOM LITE) w/Device  KIT 1 kit by Does not apply route daily.  Marland Kitchen denosumab (PROLIA) 60 MG/ML SOSY injection Inject 60 mg into the skin every 6 (six) months.  . diltiazem (CARDIZEM CD) 120 MG 24 hr capsule Take 1 capsule (120 mg total) by mouth daily.  . fluticasone (FLONASE) 50 MCG/ACT nasal spray PLACE 1 SPRAY INTO BOTH NOSTRILS DAILY.  . fluticasone (FLOVENT HFA) 110 MCG/ACT inhaler Inhale 2 puffs into the lungs 2 (two) times daily.  . furosemide (LASIX) 40 MG tablet Take 1 tablet (40 mg) by mouth once daily as needed for weight gain, increased shortness of breath, or swelling  . glucose blood (FREESTYLE LITE) test strip Use as directed once a daily Diag E11.65  . ipratropium-albuterol (DUONEB) 0.5-2.5 (3) MG/3ML SOLN Take 3 mLs by nebulization 3 (three) times daily as needed. Use 1 vial 3 times daily as needed for SOB dia j45.1  . Lancets (FREESTYLE) lancets Use as directed once daily diag e11.65  . levothyroxine (SYNTHROID) 25 MCG tablet TAKE 1 DAILY BY MOUTH IN THE MORNING ON AN EMPTY STOMACH  . metoprolol tartrate (LOPRESSOR) 100 MG tablet Take 1.5 tablets twice daily. (Patient taking differently: Take 150 mg by mouth 2 (two) times daily. Take 1.5 tablets twice daily. )  . mometasone (ELOCON) 0.1 % lotion Apply 4 drops topically at bedtime as needed for itching.  . montelukast (SINGULAIR) 10 MG tablet TAKE 1 TABLET BY MOUTH EVERY DAY FOR COUGH  . Multiple Vitamins-Minerals (MULTIVITAMIN WITH MINERALS) tablet Take 1 tablet by mouth daily.  . OXYGEN Inhale 2 L into the lungs every evening. Sleep with nasal cannula w/  O2 at 2lpm.  . potassium chloride (K-DUR) 10 MEQ tablet Take 1 tablet (10 meq) by mouth once daily as needed on days you are taking lasix  . Respiratory Therapy Supplies (ONE FLOW SPIROMETER) DEVI Please use device as tolerated and according to instruction provided.  . rosuvastatin (CRESTOR) 5 MG tablet Take 1 tablet (5 mg total) by mouth daily at 6 PM.  . sertraline (ZOLOFT) 100 MG tablet Take 1  tablet (100 mg total) by mouth daily.  . vitamin C (ASCORBIC ACID) 500 MG tablet Take 500 mg by mouth daily.   No facility-administered encounter medications on file as of 12/17/2018.     Surgical History: Past Surgical History:  Procedure Laterality Date  . BREAST CYST ASPIRATION Left 20+ yrs ago  . CARDIOVERSION N/A 11/11/2018   Procedure: CARDIOVERSION;  Surgeon: Nelva Bush, MD;  Location: [facility] ORS;  Service: Cardiovascular;  Laterality: N/A;  . CATARACT EXTRACTION Bilateral [id number]  . cyst removal-right shoulder  1972  . otosclerosis Left 1988   hardening of bone, other 1989 redone  . THYROIDECTOMY  1970   nodule and 1/2 bridge removal  . WRIST SURGERY Left 11/29/2009    Medical History: Past Medical History:  Diagnosis Date  . Arthritis   . Asthma   . Cancer ([place])    skin  . Depression   . DVT (deep venous thrombosis) ([place])   . Hyperlipidemia   . Hypertension   . Phlebitis     Family History: Family History  Problem Relation Age of Onset  . Osteoarthritis Mother   . Depression Father   . Breast cancer Neg Hx     Social History: Social History   Socioeconomic History  . Marital status: Widowed    Spouse name: Not on file  . Number of children: Not on file  . Years of education: Not on file  . Highest education level: Not on file  Occupational History  . Not on file  Social Needs  . Financial resource strain: Not on file  . Food insecurity    Worry: Not on file    Inability: Not on file  . Transportation needs    Medical: Not on file    Non-medical: Not on file  Tobacco Use  . Smoking status: Never Smoker  . Smokeless tobacco: Never Used  Substance and Sexual Activity  . Alcohol use: No  . Drug use: No  . Sexual activity: Never  Lifestyle  . Physical activity    Days per week: Not on file    Minutes per session: Not on file  . Stress: Not on file  Relationships  . Social Herbalist on phone: Not on file    Gets together:  Not on file    Attends religious service: Not on file    Active member of club or organization: Not on file    Attends meetings of clubs or organizations: Not on file    Relationship status: Not on file  . Intimate partner violence    Fear of current or ex partner: Not on file    Emotionally abused: Not on file    Physically abused: Not on file    Forced sexual activity: Not on file  Other Topics Concern  . Not on file  Social History Narrative  . Not on file    Vital Signs: Blood pressure 118/78, pulse 60, resp. rate 16, height _0  (1.575 m), weight 120 lb 6.4 oz (54.6 kg), SpO2 99 %.  Examination: General Appearance: The patient is well-developed, well-nourished, and in no distress. Skin: Gross inspection of skin unremarkable. Head: normocephalic, no gross deformities. Eyes: no gross deformities noted. ENT: ears appear grossly normal no exudates. Neck: Supple. No thyromegaly. No LAD. Respiratory: few rhonchi. Cardiovascular: Normal S1 and S2 without murmur or rub. Extremities: No cyanosis. pulses are equal. Neurologic: Alert and oriented. No involuntary movements.  LABS: Recent Results (from the past 2160 hour(s))  POCT HgB A1C     Status: Abnormal   Collection Time: 09/30/18 10:33 AM  Result Value Ref Range   Hemoglobin A1C 5.9 (A) 4.0 - 5.6 %   HbA1c POC (<> result, manual entry)     HbA1c, POC (prediabetic range)     HbA1c, POC (controlled diabetic range)    Digoxin level     Status: Abnormal   Collection Time: 10/09/18  8:40 AM  Result Value Ref Range   Digoxin, Serum 1.0 (H) 0.5 - 0.9 ng/mL    Comment: Concentrations above 2.0 ng/mL are generally considered toxic. Some overlap of toxic and non-toxic values have been reported.                             Detection Limit = 0.4 ng/mL Therapeutic range is derived from 2013 ACCF/AHA Guidelines for the Management of Heart Failure.   T4, free     Status: None   Collection Time: 10/09/18  8:40 AM  Result Value  Ref Range   Free T4 1.22 0.82 - 1.77 ng/dL  CBC with Differential/Platelet     Status: None   Collection Time: 10/09/18  8:47 AM  Result Value Ref Range   WBC 8.1 3.4 - 10.8 x10E3/uL   RBC 4.26 3.77 - 5.28 x10E6/uL   Hemoglobin 13.8 11.1 - 15.9 g/dL   Hematocrit 41.1 34.0 - 46.6 %   MCV 97 79 - 97 fL   MCH 32.4 26.6 - 33.0 pg   MCHC 33.6 31.5 - 35.7 g/dL   RDW 12.2 11.7 - 15.4 %   Platelets 225 150 - 450 x10E3/uL   Neutrophils 66 Not Estab. %   Lymphs 20 Not Estab. %   Monocytes 8 Not Estab. %   Eos 5 Not Estab. %   Basos 1 Not Estab. %   Neutrophils Absolute 5.3 1.4 - 7.0 x10E3/uL   Lymphocytes Absolute 1.6 0.7 - 3.1 x10E3/uL   Monocytes Absolute 0.7 0.1 - 0.9 x10E3/uL   EOS (ABSOLUTE) 0.4 0.0 - 0.4 x10E3/uL   Basophils Absolute 0.1 0.0 - 0.2 x10E3/uL   Immature Granulocytes 0 Not Estab. %   Immature Grans (Abs) 0.0 0.0 - 0.1 x10E3/uL  TSH     Status: Abnormal   Collection Time: 10/09/18  8:48 AM  Result Value Ref Range   TSH 4.660 (H) 0.450 - 4.500 uIU/mL  Lipid Panel With LDL/HDL Ratio     Status: Abnormal   Collection Time: 10/09/18  8:48 AM  Result Value Ref Range   Cholesterol, Total 119 100 - 199 mg/dL   Triglycerides 121 0 - 149 mg/dL   HDL 38 (L) >39 mg/dL   VLDL Cholesterol Cal 24 5 - 40 mg/dL   LDL Calculated 57 0 - 99 mg/dL   LDl/HDL Ratio 1.5 0.0 - 3.2 ratio    Comment:  LDL/HDL Ratio                                             Men  Women                               1/2 Avg.Risk  1.0    1.5                                   Avg.Risk  3.6    3.2                                2X Avg.Risk  6.2    5.0                                3X Avg.Risk  8.0    6.1   CBC     Status: None   Collection Time: 10/10/18 10:58 AM  Result Value Ref Range   WBC 7.7 4.0 - 10.5 K/uL   RBC 4.19 3.87 - 5.11 MIL/uL   Hemoglobin 13.3 12.0 - 15.0 g/dL   HCT 41.2 36.0 - 46.0 %   MCV 98.3 80.0 - 100.0 fL   MCH 31.7 26.0 - 34.0 pg   MCHC 32.3  30.0 - 36.0 g/dL   RDW 13.7 11.5 - 15.5 %   Platelets 213 150 - 400 K/uL   nRBC 0.0 0.0 - 0.2 %    Comment: Performed at [facility], [address] [place], [place] [address]  Troponin I - ONCE - STAT     Status: Abnormal   Collection Time: 10/10/18 10:58 AM  Result Value Ref Range   Troponin I 0.04 (HH) <0.03 ng/mL    Comment: CRITICAL RESULT CALLED TO, READ BACK BY AND VERIFIED WITH KIM GAULT _0  10/10/18 AKT Performed at [facility], [address] [place], [place] [address]   Comprehensive metabolic panel     Status: Abnormal   Collection Time: 10/10/18 10:58 AM  Result Value Ref Range   Sodium 138 135 - 145 mmol/L   Potassium 4.3 3.5 - 5.1 mmol/L   Chloride 99 98 - 111 mmol/L   CO2 31 22 - 32 mmol/L   Glucose, Bld 176 (H) 70 - 99 mg/dL   BUN 17 8 - 23 mg/dL   Creatinine, Ser 0.48 0.44 - 1.00 mg/dL   Calcium 8.8 (L) 8.9 - 10.3 mg/dL   Total Protein 7.2 6.5 - 8.1 g/dL   Albumin 3.2 (L) 3.5 - 5.0 g/dL   AST 20 15 - 41 U/L   ALT 21 0 - 44 U/L   Alkaline Phosphatase 52 38 - 126 U/L   Total Bilirubin 0.7 0.3 - 1.2 mg/dL   GFR calc non Af Amer >60 >60 mL/min   GFR calc Af Amer >60 >60 mL/min   Anion gap 8 5 - 15    Comment: Performed at [facility], [address] [place], [place] [address]  Digoxin level     Status: Abnormal   Collection Time: 10/10/18 11:13 AM  Result Value Ref Range   Digoxin Level 0.7 (  L) 0.8 - 2.0 ng/mL    Comment: Performed at [facility], [address] [place], [place] [address]  Troponin I - ONCE - STAT     Status: Abnormal   Collection Time: 10/10/18  1:24 PM  Result Value Ref Range   Troponin I 0.03 (HH) <0.03 ng/mL    Comment: CRITICAL VALUE NOTED. VALUE IS CONSISTENT WITH PREVIOUSLY REPORTED/CALLED VALUE.QSD Performed at [facility], [address] [place], [place] [address]   SARS Coronavirus 2 (CEPHEID - Performed in [facility] hospital lab), Hosp Order     Status: None    Collection Time: 10/10/18  2:49 PM   Specimen: Nasopharyngeal Swab  Result Value Ref Range   SARS Coronavirus 2 NEGATIVE NEGATIVE    Comment: (NOTE) If result is NEGATIVE SARS-CoV-2 target nucleic acids are NOT DETECTED. The SARS-CoV-2 RNA is generally detectable in upper and lower  respiratory specimens during the acute phase of infection. The lowest  concentration of SARS-CoV-2 viral copies this assay can detect is 250  copies / mL. A negative result does not preclude SARS-CoV-2 infection  and should not be used as the sole basis for treatment or other  patient management decisions.  A negative result may occur with  improper specimen collection / handling, submission of specimen other  than nasopharyngeal swab, presence of viral mutation(s) within the  areas targeted by this assay, and inadequate number of viral copies  (<250 copies / mL). A negative result must be combined with clinical  observations, patient history, and epidemiological information. If result is POSITIVE SARS-CoV-2 target nucleic acids are DETECTED. The SARS-CoV-2 RNA is generally detectable in upper and lower  respiratory specimens dur ing the acute phase of infection.  Positive  results are indicative of active infection with SARS-CoV-2.  Clinical  correlation with patient history and other diagnostic information is  necessary to determine patient infection status.  Positive results do  not rule out bacterial infection or co-infection with other viruses. If result is PRESUMPTIVE POSTIVE SARS-CoV-2 nucleic acids MAY BE PRESENT.   A presumptive positive result was obtained on the submitted specimen  and confirmed on repeat testing.  While 2019 novel coronavirus  (SARS-CoV-2) nucleic acids may be present in the submitted sample  additional confirmatory testing may be necessary for epidemiological  and / or clinical management purposes  to differentiate between  SARS-CoV-2 and other Sarbecovirus currently known  to infect humans.  If clinically indicated additional testing with an alternate test  methodology [id number]) is advised. The SARS-CoV-2 RNA is generally  detectable in upper and lower respiratory sp ecimens during the acute  phase of infection. The expected result is Negative. Fact Sheet for Patients:  StrictlyIdeas.no Fact Sheet for Healthcare Providers: BankingDealers.co.za This test is not yet approved or cleared by the [place] FDA and has been authorized for detection and/or diagnosis of SARS-CoV-2 by FDA under an Emergency Use Authorization (EUA).  This EUA will remain in effect (meaning this test can be used) for the duration of the COVID-19 declaration under Section 564(b)(1) of the Act, 21 U.S.C. section 360bbb-3(b)(1), unless the authorization is terminated or revoked sooner. Performed at [facility], [address] [place], [place] [address]   CBC with Differential/Platelet     Status: Abnormal   Collection Time: 11/05/18 11:36 AM  Result Value Ref Range   WBC 8.8 3.4 - 10.8 x10E3/uL   RBC 3.79 3.77 - 5.28 x10E6/uL   Hemoglobin 12.2 11.1 - 15.9 g/dL  Hematocrit 37.6 34.0 - 46.6 %   MCV 99 (H) 79 - 97 fL   MCH 32.2 26.6 - 33.0 pg   MCHC 32.4 31.5 - 35.7 g/dL   RDW 12.4 11.7 - 15.4 %   Platelets 228 150 - 450 x10E3/uL   Neutrophils 72 Not Estab. %   Lymphs 15 Not Estab. %   Monocytes 9 Not Estab. %   Eos 3 Not Estab. %   Basos 1 Not Estab. %   Neutrophils Absolute 6.4 1.4 - 7.0 x10E3/uL   Lymphocytes Absolute 1.3 0.7 - 3.1 x10E3/uL   Monocytes Absolute 0.8 0.1 - 0.9 x10E3/uL   EOS (ABSOLUTE) 0.3 0.0 - 0.4 x10E3/uL   Basophils Absolute 0.1 0.0 - 0.2 x10E3/uL   Immature Granulocytes 0 Not Estab. %   Immature Grans (Abs) 0.0 0.0 - 0.1 M19Q2/IW  Basic metabolic panel     Status: Abnormal   Collection Time: 11/05/18 11:36 AM  Result Value Ref Range   Glucose 143 (H) 65 - 99 mg/dL   BUN 15 10 - 36 mg/dL    Creatinine, Ser 0.50 (L) 0.57 - 1.00 mg/dL   GFR calc non Af Amer 82 >59 mL/min/1.73   GFR calc Af Amer 94 >59 mL/min/1.73   BUN/Creatinine Ratio 30 (H) 12 - 28   Sodium 136 134 - 144 mmol/L   Potassium 4.5 3.5 - 5.2 mmol/L   Chloride 94 (L) 96 - 106 mmol/L   CO2 25 20 - 29 mmol/L   Calcium 9.1 8.7 - 10.3 mg/dL  CBC with Differential/Platelet     Status: Abnormal   Collection Time: 11/10/18  5:46 PM  Result Value Ref Range   WBC 9.2 4.0 - 10.5 K/uL   RBC 3.71 (L) 3.87 - 5.11 MIL/uL   Hemoglobin 11.9 (L) 12.0 - 15.0 g/dL   HCT 37.0 36.0 - 46.0 %   MCV 99.7 80.0 - 100.0 fL   MCH 32.1 26.0 - 34.0 pg   MCHC 32.2 30.0 - 36.0 g/dL   RDW 14.0 11.5 - 15.5 %   Platelets 227 150 - 400 K/uL   nRBC 0.0 0.0 - 0.2 %   Neutrophils Relative % 70 %   Neutro Abs 6.4 1.7 - 7.7 K/uL   Lymphocytes Relative 17 %   Lymphs Abs 1.6 0.7 - 4.0 K/uL   Monocytes Relative 8 %   Monocytes Absolute 0.8 0.1 - 1.0 K/uL   Eosinophils Relative 4 %   Eosinophils Absolute 0.4 0.0 - 0.5 K/uL   Basophils Relative 1 %   Basophils Absolute 0.1 0.0 - 0.1 K/uL   Immature Granulocytes 0 %   Abs Immature Granulocytes 0.03 0.00 - 0.07 K/uL    Comment: Performed at [facility], [address] [place], [place] [address]  Comprehensive metabolic panel     Status: Abnormal   Collection Time: 11/10/18  5:46 PM  Result Value Ref Range   Sodium 133 (L) 135 - 145 mmol/L   Potassium 4.5 3.5 - 5.1 mmol/L   Chloride 95 (L) 98 - 111 mmol/L   CO2 27 22 - 32 mmol/L   Glucose, Bld 142 (H) 70 - 99 mg/dL   BUN 19 8 - 23 mg/dL   Creatinine, Ser 0.55 0.44 - 1.00 mg/dL   Calcium 8.9 8.9 - 10.3 mg/dL   Total Protein 7.9 6.5 - 8.1 g/dL   Albumin 3.5 3.5 - 5.0 g/dL   AST 47 (H) 15 - 41 U/L   ALT 56 (H)  0 - 44 U/L   Alkaline Phosphatase 69 38 - 126 U/L   Total Bilirubin 0.7 0.3 - 1.2 mg/dL   GFR calc non Af Amer >60 >60 mL/min   GFR calc Af Amer >60 >60 mL/min   Anion gap 11 5 - 15    Comment: Performed at [facility], [address] [place], [place] [address]  Magnesium     Status: None   Collection Time: 11/10/18  5:46 PM  Result Value Ref Range   Magnesium 2.1 1.7 - 2.4 mg/dL    Comment: Performed at [facility], [address] [place], [place] [address]  Brain natriuretic peptide     Status: Abnormal   Collection Time: 11/10/18  5:46 PM  Result Value Ref Range   B Natriuretic Peptide 312.0 (H) 0.0 - 100.0 pg/mL    Comment: Performed at [facility], [address] [place], [place] [address]  Procalcitonin     Status: None   Collection Time: 11/10/18  5:46 PM  Result Value Ref Range   Procalcitonin <0.10 ng/mL    Comment:        Interpretation: PCT (Procalcitonin) <= 0.5 ng/mL: Systemic infection (sepsis) is not likely. Local bacterial infection is possible. (NOTE)       Sepsis PCT Algorithm           Lower Respiratory Tract                                      Infection PCT Algorithm    ----------------------------     ----------------------------         PCT < 0.25 ng/mL                PCT < 0.10 ng/mL         Strongly encourage             Strongly discourage   discontinuation of antibiotics    initiation of antibiotics    ----------------------------     -----------------------------       PCT 0.25 - 0.50 ng/mL            PCT 0.10 - 0.25 ng/mL               OR       >80% decrease in PCT            Discourage initiation of                                            antibiotics      Encourage discontinuation           of antibiotics    ----------------------------     -----------------------------         PCT >= 0.50 ng/mL              PCT 0.26 - 0.50 ng/mL               AND        <80% decrease in PCT             Encourage initiation of  antibiotics       Encourage continuation           of antibiotics    ----------------------------     -----------------------------        PCT >= 0.50 ng/mL                   PCT > 0.50 ng/mL               AND         increase in PCT                  Strongly encourage                                      initiation of antibiotics    Strongly encourage escalation           of antibiotics                                     -----------------------------                                           PCT <= 0.25 ng/mL                                                 OR                                        > 80% decrease in PCT                                     Discontinue / Do not initiate                                             antibiotics Performed at [facility], [address] [place], [place] [address]   SARS Coronavirus 2 (CEPHEID- Performed in [place] hospital lab), Hosp Order     Status: None   Collection Time: 11/10/18  5:47 PM   Specimen: Nasopharyngeal Swab  Result Value Ref Range   SARS Coronavirus 2 NEGATIVE NEGATIVE    Comment: (NOTE) If result is NEGATIVE SARS-CoV-2 target nucleic acids are NOT DETECTED. The SARS-CoV-2 RNA is generally detectable in upper and lower  respiratory specimens during the acute phase of infection. The lowest  concentration of SARS-CoV-2 viral copies this assay can detect is 250  copies / mL. A negative result does not preclude SARS-CoV-2 infection  and should not be used as the sole basis for treatment or other  patient management decisions.  A negative result may occur with  improper specimen collection / handling, submission of specimen other  than nasopharyngeal swab, presence of viral mutation(s) within the  areas targeted by this assay, and inadequate number  of viral copies  (<250 copies / mL). A negative result must be combined with clinical  observations, patient history, and epidemiological information. If result is POSITIVE SARS-CoV-2 target nucleic acids are DETECTED. The SARS-CoV-2 RNA is generally detectable in upper and lower  respiratory specimens dur ing the acute phase of  infection.  Positive  results are indicative of active infection with SARS-CoV-2.  Clinical  correlation with patient history and other diagnostic information is  necessary to determine patient infection status.  Positive results do  not rule out bacterial infection or co-infection with other viruses. If result is PRESUMPTIVE POSTIVE SARS-CoV-2 nucleic acids MAY BE PRESENT.   A presumptive positive result was obtained on the submitted specimen  and confirmed on repeat testing.  While 2019 novel coronavirus  (SARS-CoV-2) nucleic acids may be present in the submitted sample  additional confirmatory testing may be necessary for epidemiological  and / or clinical management purposes  to differentiate between  SARS-CoV-2 and other Sarbecovirus currently known to infect humans.  If clinically indicated additional testing with an alternate test  methodology [id number]) is advised. The SARS-CoV-2 RNA is generally  detectable in upper and lower respiratory sp ecimens during the acute  phase of infection. The expected result is Negative. Fact Sheet for Patients:  StrictlyIdeas.no Fact Sheet for Healthcare Providers: BankingDealers.co.za This test is not yet approved or cleared by the [place] FDA and has been authorized for detection and/or diagnosis of SARS-CoV-2 by FDA under an Emergency Use Authorization (EUA).  This EUA will remain in effect (meaning this test can be used) for the duration of the COVID-19 declaration under Section 564(b)(1) of the Act, 21 U.S.C. section 360bbb-3(b)(1), unless the authorization is terminated or revoked sooner. Performed at [facility], [address] [place], [place] [address]   Blood Culture (routine x 2)     Status: None   Collection Time: 11/10/18  6:33 PM   Specimen: BLOOD  Result Value Ref Range   Specimen Description BLOOD RIGHT ANTECUBITAL    Special Requests      BOTTLES DRAWN AEROBIC  AND ANAEROBIC Blood Culture results may not be optimal due to an inadequate volume of blood received in culture bottles   Culture      NO GROWTH 5 DAYS Performed at [facility], [address], [place], [place] [address]    Report Status 11/15/2018 FINAL   Blood Culture (routine x 2)     Status: Abnormal   Collection Time: 11/10/18  6:33 PM   Specimen: BLOOD  Result Value Ref Range   Specimen Description      BLOOD LEFT ANTECUBITAL Performed at [facility], [address], [place], [place] [address]    Special Requests      BOTTLES DRAWN AEROBIC AND ANAEROBIC Blood Culture adequate volume Performed at [facility], [address] [place], [place] [address]    Culture  Setup Time      Organism ID to follow [place] CRITICAL RESULT CALLED TO, READ BACK BY AND VERIFIED WITH:  SCOTT HALL ON 11/11/2018 AT 10:22. TIK Performed at [facility], [place], [place] [address]    Culture (A)     STAPHYLOCOCCUS SPECIES (COAGULASE NEGATIVE) THE SIGNIFICANCE OF ISOLATING THIS ORGANISM FROM A SINGLE SET OF BLOOD CULTURES WHEN MULTIPLE SETS ARE DRAWN IS UNCERTAIN. PLEASE NOTIFY THE MICROBIOLOGY DEPARTMENT WITHIN ONE WEEK IF SPECIATION AND SENSITIVITIES ARE REQUIRED. Performed at [facility], [place] [address], [place], [place] [address]    Report Status 11/16/2018 FINAL   Urine culture     Status: Abnormal   Collection Time: 11/10/18  6:33 PM   Specimen: Urine, Random  Result Value Ref Range   Specimen Description      URINE, RANDOM Performed at [facility], [address] [place], [place] [address]    Special Requests      NONE Performed at [facility], [address] [place], [place] [address]    Culture MULTIPLE SPECIES PRESENT, SUGGEST RECOLLECTION (A)    Report Status 11/12/2018 FINAL   Urinalysis, Routine w reflex microscopic     Status: Abnormal    Collection Time: 11/10/18  6:33 PM  Result Value Ref Range   Color, Urine AMBER (A) YELLOW    Comment: BIOCHEMICALS MAY BE AFFECTED BY COLOR   APPearance CLOUDY (A) CLEAR   Specific Gravity, Urine 1.019 1.005 - 1.030   pH 6.0 5.0 - 8.0   Glucose, UA NEGATIVE NEGATIVE mg/dL   Hgb urine dipstick NEGATIVE NEGATIVE   Bilirubin Urine NEGATIVE NEGATIVE   Ketones, ur NEGATIVE NEGATIVE mg/dL   Protein, ur NEGATIVE NEGATIVE mg/dL   Nitrite NEGATIVE NEGATIVE   Leukocytes,Ua MODERATE (A) NEGATIVE   RBC / HPF 6-10 0 - 5 RBC/hpf   WBC, UA 21-50 0 - 5 WBC/hpf   Bacteria, UA NONE SEEN NONE SEEN   Squamous Epithelial / LPF 0-5 0 - 5   Mucus PRESENT     Comment: Performed at [facility], [address] [place], [place] [address]  Blood Culture ID Panel (Reflexed)     Status: Abnormal   Collection Time: 11/10/18  6:33 PM  Result Value Ref Range   Enterococcus species NOT DETECTED NOT DETECTED   Listeria monocytogenes NOT DETECTED NOT DETECTED   Staphylococcus species DETECTED (A) NOT DETECTED    Comment: Methicillin (oxacillin) susceptible coagulase negative staphylococcus. Possible blood culture contaminant (unless isolated from more than one blood culture draw or clinical case suggests pathogenicity). No antibiotic treatment is indicated for blood  culture contaminants. CRITICAL RESULT CALLED TO, READ BACK BY AND VERIFIED WITH:  SCOTT HALL ON 11/11/2018 AT 10:22. TIK    Staphylococcus aureus (BCID) NOT DETECTED NOT DETECTED   Methicillin resistance NOT DETECTED NOT DETECTED   Streptococcus species NOT DETECTED NOT DETECTED   Streptococcus agalactiae NOT DETECTED NOT DETECTED   Streptococcus pneumoniae NOT DETECTED NOT DETECTED   Streptococcus pyogenes NOT DETECTED NOT DETECTED   Acinetobacter baumannii NOT DETECTED NOT DETECTED   Enterobacteriaceae species NOT DETECTED NOT DETECTED   Enterobacter cloacae complex NOT DETECTED NOT DETECTED   Escherichia coli NOT DETECTED NOT  DETECTED   Klebsiella oxytoca NOT DETECTED NOT DETECTED   Klebsiella pneumoniae NOT DETECTED NOT DETECTED   Proteus species NOT DETECTED NOT DETECTED   Serratia marcescens NOT DETECTED NOT DETECTED   Haemophilus influenzae NOT DETECTED NOT DETECTED   Neisseria meningitidis NOT DETECTED NOT DETECTED   Pseudomonas aeruginosa NOT DETECTED NOT DETECTED   Candida albicans NOT DETECTED NOT DETECTED   Candida glabrata NOT DETECTED NOT DETECTED   Candida krusei NOT DETECTED NOT DETECTED   Candida parapsilosis NOT DETECTED NOT DETECTED   Candida tropicalis NOT DETECTED NOT DETECTED    Comment: Performed at [facility], [address] [place], [place] [address]  Lactic acid, plasma     Status: None   Collection Time: 11/11/18 12:10 AM  Result Value Ref Range  Lactic Acid, Venous 1.3 0.5 - 1.9 mmol/L    Comment: Performed at [facility], [address] [place], [place] [address]  TSH     Status: None   Collection Time: 11/11/18 12:10 AM  Result Value Ref Range   TSH 4.231 0.350 - 4.500 uIU/mL    Comment: Performed by a 3rd Generation assay with a functional sensitivity of <=0.01 uIU/mL. Performed at [facility], [address] [place], [place] [address]   Brain natriuretic peptide     Status: Abnormal   Collection Time: 11/11/18 12:10 AM  Result Value Ref Range   B Natriuretic Peptide 283.0 (H) 0.0 - 100.0 pg/mL    Comment: Performed at [facility], [place], [place] [address]  Troponin I (High Sensitivity)     Status: None   Collection Time: 11/11/18 12:10 AM  Result Value Ref Range   Troponin I (High Sensitivity) 5 <18 ng/L    Comment: (NOTE) Elevated high sensitivity troponin I (hsTnI) values and significant  changes across serial measurements may suggest ACS but many other  chronic and acute conditions are known to elevate hsTnI results.  Refer to the "Links" section for chest pain algorithms and additional   guidance. Performed at [facility], [address] [place], [place] [address]   Glucose, capillary     Status: Abnormal   Collection Time: 11/11/18  1:43 AM  Result Value Ref Range   Glucose-Capillary 120 (H) 70 - 99 mg/dL  Lipid panel     Status: Abnormal   Collection Time: 11/11/18  3:17 AM  Result Value Ref Range   Cholesterol 79 0 - 200 mg/dL   Triglycerides 34 <150 mg/dL   HDL 30 (L) >40 mg/dL   Total CHOL/HDL Ratio 2.6 RATIO   VLDL 7 0 - 40 mg/dL   LDL Cholesterol 42 0 - 99 mg/dL    Comment:        Total Cholesterol/HDL:CHD Risk Coronary Heart Disease Risk Table                     Men   Women  1/2 Average Risk   3.4   3.3  Average Risk       5.0   4.4  2 X Average Risk   9.6   7.1  3 X Average Risk  23.4   11.0        Use the calculated Patient Ratio above and the CHD Risk Table to determine the patient's CHD Risk.        ATP III CLASSIFICATION (LDL):  <100     mg/dL   Optimal  100-129  mg/dL   Near or Above                    Optimal  130-159  mg/dL   Borderline  160-189  mg/dL   High  >190     mg/dL   Very High Performed at [facility], [address] [place], [place] [address]   CBC     Status: Abnormal   Collection Time: 11/11/18  3:17 AM  Result Value Ref Range   WBC 9.3 4.0 - 10.5 K/uL   RBC 3.52 (L) 3.87 - 5.11 MIL/uL   Hemoglobin 11.4 (L) 12.0 - 15.0 g/dL   HCT 35.5 (L) 36.0 - 46.0 %   MCV 100.9 (H) 80.0 - 100.0 fL   MCH 32.4 26.0 - 34.0 pg   MCHC  32.1 30.0 - 36.0 g/dL   RDW 13.9 11.5 - 15.5 %   Platelets 195 150 - 400 K/uL   nRBC 0.0 0.0 - 0.2 %    Comment: Performed at [facility], [address] [place], [place] [address]  Basic metabolic panel     Status: Abnormal   Collection Time: 11/11/18  3:17 AM  Result Value Ref Range   Sodium 136 135 - 145 mmol/L   Potassium 4.2 3.5 - 5.1 mmol/L   Chloride 98 98 - 111 mmol/L   CO2 31 22 - 32 mmol/L   Glucose, Bld 136 (H) 70 - 99 mg/dL   BUN 16 8 - 23 mg/dL    Creatinine, Ser 0.56 0.44 - 1.00 mg/dL   Calcium 8.5 (L) 8.9 - 10.3 mg/dL   GFR calc non Af Amer >60 >60 mL/min   GFR calc Af Amer >60 >60 mL/min   Anion gap 7 5 - 15    Comment: Performed at [facility], [address] [place], [place] [address]  Protime-INR     Status: Abnormal   Collection Time: 11/11/18  3:17 AM  Result Value Ref Range   Prothrombin Time 18.7 (H) 11.4 - 15.2 seconds   INR 1.6 (H) 0.8 - 1.2    Comment: (NOTE) INR goal varies based on device and disease states. Performed at [facility], [address] [place], [place] [address]   Lactic acid, plasma     Status: None   Collection Time: 11/11/18  3:17 AM  Result Value Ref Range   Lactic Acid, Venous 0.8 0.5 - 1.9 mmol/L    Comment: Performed at [facility], [place], [place] [address]  Troponin I (High Sensitivity)     Status: None   Collection Time: 11/11/18  3:17 AM  Result Value Ref Range   Troponin I (High Sensitivity) 5 <18 ng/L    Comment: (NOTE) Elevated high sensitivity troponin I (hsTnI) values and significant  changes across serial measurements may suggest ACS but many other  chronic and acute conditions are known to elevate hsTnI results.  Refer to the "Links" section for chest pain algorithms and additional  guidance. Performed at [facility], [address] [place], [place] [address]   ECHOCARDIOGRAM COMPLETE     Status: None   Collection Time: 11/11/18  2:24 PM  Result Value Ref Range   Weight 2,091.72 oz   Height 62 in   BP 123/67 mmHg  Basic metabolic panel     Status: Abnormal   Collection Time: 11/12/18 12:40 PM  Result Value Ref Range   Sodium 138 135 - 145 mmol/L   Potassium 3.6 3.5 - 5.1 mmol/L   Chloride 94 (L) 98 - 111 mmol/L   CO2 35 (H) 22 - 32 mmol/L   Glucose, Bld 116 (H) 70 - 99 mg/dL   BUN 12 8 - 23 mg/dL   Creatinine, Ser 0.47 0.44 - 1.00 mg/dL   Calcium 8.9 8.9 - 10.3 mg/dL   GFR calc non Af Amer >60 >60  mL/min   GFR calc Af Amer >60 >60 mL/min   Anion gap 9 5 - 15    Comment: Performed at [facility], [address] [place], [place] [address]  CBC     Status: Abnormal   Collection Time: 11/13/18  6:02 AM  Result Value Ref Range   WBC 10.9 (H) 4.0 - 10.5 K/uL   RBC 4.09 3.87 - 5.11  MIL/uL   Hemoglobin 13.3 12.0 - 15.0 g/dL   HCT 40.4 36.0 - 46.0 %   MCV 98.8 80.0 - 100.0 fL   MCH 32.5 26.0 - 34.0 pg   MCHC 32.9 30.0 - 36.0 g/dL   RDW 13.6 11.5 - 15.5 %   Platelets 219 150 - 400 K/uL   nRBC 0.0 0.0 - 0.2 %    Comment: Performed at [facility], [address] [place], [place] [address]  Basic metabolic panel     Status: Abnormal   Collection Time: 11/13/18  6:02 AM  Result Value Ref Range   Sodium 135 135 - 145 mmol/L   Potassium 3.5 3.5 - 5.1 mmol/L   Chloride 91 (L) 98 - 111 mmol/L   CO2 33 (H) 22 - 32 mmol/L   Glucose, Bld 150 (H) 70 - 99 mg/dL   BUN 10 8 - 23 mg/dL   Creatinine, Ser 0.40 (L) 0.44 - 1.00 mg/dL   Calcium 8.6 (L) 8.9 - 10.3 mg/dL   GFR calc non Af Amer >60 >60 mL/min   GFR calc Af Amer >60 >60 mL/min   Anion gap 11 5 - 15    Comment: Performed at [facility], [address] [place], [place] [address]  Vitamin B12     Status: Abnormal   Collection Time: 11/13/18  6:02 AM  Result Value Ref Range   Vitamin B-12 1,164 (H) 180 - 914 pg/mL    Comment: (NOTE) This assay is not validated for testing neonatal or myeloproliferative syndrome specimens for Vitamin B12 levels. Performed at [facility], [place] [address], [place], [place] [address]   Folate     Status: None   Collection Time: 11/13/18  6:02 AM  Result Value Ref Range   Folate 34.0 >5.9 ng/mL    Comment: Performed at [facility], [address] [place], [place] [address]  Basic metabolic panel     Status: Abnormal   Collection Time: 11/14/18  5:12 AM  Result Value Ref Range   Sodium 133 (L) 135 - 145 mmol/L   Potassium 3.4 (L) 3.5 - 5.1 mmol/L    Chloride 92 (L) 98 - 111 mmol/L   CO2 34 (H) 22 - 32 mmol/L   Glucose, Bld 156 (H) 70 - 99 mg/dL   BUN 21 8 - 23 mg/dL   Creatinine, Ser 0.47 0.44 - 1.00 mg/dL   Calcium 8.4 (L) 8.9 - 10.3 mg/dL   GFR calc non Af Amer >60 >60 mL/min   GFR calc Af Amer >60 >60 mL/min   Anion gap 7 5 - 15    Comment: Performed at [facility], [address] [place], [place] [address]  CBC     Status: Abnormal   Collection Time: 11/14/18  5:12 AM  Result Value Ref Range   WBC 11.0 (H) 4.0 - 10.5 K/uL   RBC 3.92 3.87 - 5.11 MIL/uL   Hemoglobin 12.6 12.0 - 15.0 g/dL   HCT 37.7 36.0 - 46.0 %   MCV 96.2 80.0 - 100.0 fL   MCH 32.1 26.0 - 34.0 pg   MCHC 33.4 30.0 - 36.0 g/dL   RDW 13.7 11.5 - 15.5 %   Platelets 218 150 - 400 K/uL   nRBC 0.0 0.0 - 0.2 %    Comment: Performed at [facility], [address], [place], [place] [address]  Basic metabolic panel     Status: Abnormal   Collection Time: 11/15/18  4:12 AM  Result Value Ref Range   Sodium 133 (L) 135 - 145 mmol/L   Potassium 3.0 (L) 3.5 - 5.1 mmol/L   Chloride 92 (L) 98 - 111 mmol/L   CO2 34 (H) 22 - 32 mmol/L   Glucose, Bld 157 (H) 70 - 99 mg/dL   BUN 21 8 - 23 mg/dL   Creatinine, Ser 0.45 0.44 - 1.00 mg/dL   Calcium 8.3 (L) 8.9 - 10.3 mg/dL   GFR calc non Af Amer >60 >60 mL/min   GFR calc Af Amer >60 >60 mL/min   Anion gap 7 5 - 15    Comment: Performed at [facility], [address] [place], [place] [address]  CBC     Status: None   Collection Time: 11/16/18  5:21 AM  Result Value Ref Range   WBC 8.6 4.0 - 10.5 K/uL   RBC 3.92 3.87 - 5.11 MIL/uL   Hemoglobin 12.5 12.0 - 15.0 g/dL   HCT 39.0 36.0 - 46.0 %   MCV 99.5 80.0 - 100.0 fL   MCH 31.9 26.0 - 34.0 pg   MCHC 32.1 30.0 - 36.0 g/dL   RDW 13.6 11.5 - 15.5 %   Platelets 231 150 - 400 K/uL   nRBC 0.0 0.0 - 0.2 %    Comment: Performed at [facility], [address] [place], [place] [address]  Basic metabolic panel     Status: Abnormal    Collection Time: 11/16/18  5:21 AM  Result Value Ref Range   Sodium 138 135 - 145 mmol/L   Potassium 4.1 3.5 - 5.1 mmol/L   Chloride 97 (L) 98 - 111 mmol/L   CO2 33 (H) 22 - 32 mmol/L   Glucose, Bld 143 (H) 70 - 99 mg/dL   BUN 21 8 - 23 mg/dL   Creatinine, Ser 0.40 (L) 0.44 - 1.00 mg/dL   Calcium 8.7 (L) 8.9 - 10.3 mg/dL   GFR calc non Af Amer >60 >60 mL/min   GFR calc Af Amer >60 >60 mL/min   Anion gap 8 5 - 15    Comment: Performed at [facility], [address] [place], [place] [address]  CBC     Status: None   Collection Time: 11/26/18  4:29 PM  Result Value Ref Range   WBC 9.2 4.0 - 10.5 K/uL   RBC 4.11 3.87 - 5.11 MIL/uL   Hemoglobin 13.0 12.0 - 15.0 g/dL   HCT 40.0 36.0 - 46.0 %   MCV 97.3 80.0 - 100.0 fL   MCH 31.6 26.0 - 34.0 pg   MCHC 32.5 30.0 - 36.0 g/dL   RDW 13.2 11.5 - 15.5 %   Platelets 255 150 - 400 K/uL   nRBC 0.0 0.0 - 0.2 %    Comment: Performed at [facility], [address] [place], [place] [address]  Comprehensive metabolic panel     Status: Abnormal   Collection Time: 11/26/18  4:29 PM  Result Value Ref Range   Sodium 136 135 - 145 mmol/L   Potassium 4.0 3.5 - 5.1 mmol/L   Chloride 92 (L) 98 - 111 mmol/L   CO2 33 (H) 22 - 32 mmol/L   Glucose, Bld 112 (H) 70 - 99 mg/dL   BUN 19 8 - 23 mg/dL   Creatinine, Ser 0.57 0.44 - 1.00 mg/dL   Calcium 9.1 8.9 - 10.3 mg/dL   Total Protein 8.0 6.5 - 8.1 g/dL   Albumin 3.5 3.5 -  5.0 g/dL   AST 27 15 - 41 U/L   ALT 25 0 - 44 U/L   Alkaline Phosphatase 63 38 - 126 U/L   Total Bilirubin 0.4 0.3 - 1.2 mg/dL   GFR calc non Af Amer >60 >60 mL/min   GFR calc Af Amer >60 >60 mL/min   Anion gap 11 5 - 15    Comment: Performed at [facility], [address] [place], [place] [address]  Basic metabolic panel     Status: Abnormal   Collection Time: 12/10/18  9:10 AM  Result Value Ref Range   Glucose 140 (H) 65 - 99 mg/dL   BUN 16 10 - 36 mg/dL   Creatinine, Ser 0.55 (L) 0.57 -  1.00 mg/dL   GFR calc non Af Amer 79 >59 mL/min/1.73   GFR calc Af Amer 92 >59 mL/min/1.73   BUN/Creatinine Ratio 29 (H) 12 - 28   Sodium 140 134 - 144 mmol/L   Potassium 4.3 3.5 - 5.2 mmol/L   Chloride 94 (L) 96 - 106 mmol/L   CO2 28 20 - 29 mmol/L   Calcium 9.3 8.7 - 10.3 mg/dL    Radiology: Dg Chest 2 View  Result Date: 11/26/2018 CLINICAL DATA:  Bibasilar crackles. Occasional nonproductive cough. EXAM: CHEST - 2 VIEW COMPARISON:  11/14/2018 FINDINGS: Chronic cardiomegaly. Pulmonary vascularity is normal. Lungs are clear. Multiple old right posterior rib fractures. No acute bone abnormality. Aortic atherosclerosis. No effusions. IMPRESSION: 1. No acute cardiopulmonary disease. Chronic cardiomegaly. 2. Aortic atherosclerosis. Electronically Signed   By: Lorriane Shire M.D.   On: 11/26/2018 16:50    No results found.  Dg Chest 2 View  Result Date: 11/26/2018 CLINICAL DATA:  Bibasilar crackles. Occasional nonproductive cough. EXAM: CHEST - 2 VIEW COMPARISON:  11/14/2018 FINDINGS: Chronic cardiomegaly. Pulmonary vascularity is normal. Lungs are clear. Multiple old right posterior rib fractures. No acute bone abnormality. Aortic atherosclerosis. No effusions. IMPRESSION: 1. No acute cardiopulmonary disease. Chronic cardiomegaly. 2. Aortic atherosclerosis. Electronically Signed   By: Lorriane Shire M.D.   On: 11/26/2018 16:50      Assessment and Plan: Patient Active Problem List   Diagnosis Date Noted  . Acute heart failure with preserved ejection fraction (HFpEF) ([place])   . Shortness of breath 10/21/2018  . Atrial fibrillation with rapid ventricular response ([place]) 10/10/2018  . Atrial fibrillation with RVR ([place]) 10/10/2018  . Nocturnal hypoxemia due to asthma 10/29/2017  . Acute pain of right wrist 09/30/2017  . Hyperlipidemia 07/23/2017  . Osteoporosis, post-menopausal 07/03/2017  . Chronic obstructive pulmonary disease, unspecified ([place]) 05/28/2017  . Pain in left knee 05/28/2017   . Essential (primary) hypertension 05/28/2017  . Cardiac arrhythmia 05/28/2017  . Hypothyroidism 05/28/2017  . Injury of right hip 06/15/2015  . Mild intermittent asthma without complication 19/41/7408    1. SOB patient is at baseline with his shortness of breath we will schedule pulmonary function study however to make sure that there is no amiodarone related toxicity. 2. CHF followed by cardiology is well controlled.  Last chest x-ray has already mentioned above shows significant improvement and the current reading is basically lungs are without this is normal. 3. A fib rate is controlled at this time we will continue with present management 4. Amiodarone therapy for now I would continue with amiodarone therapy 5. COPD she is at baseline we will continue with supportive care.  Reviewed the use of nebulizers incentive spirometry today with the daughter and the patient.  General Counseling: I  have discussed the findings of the evaluation and examination with Adely.  I have also discussed any further diagnostic evaluation thatmay be needed or ordered today. Lakayla verbalizes understanding of the findings of todays visit. We also reviewed her medications today and discussed drug interactions and side effects including but not limited excessive drowsiness and altered mental states. We also discussed that there is always a risk not just to her but also people around her. she has been encouraged to call the office with any questions or concerns that should arise related to todays visit.    Time spent: 25 minutes  I have personally obtained a history, examined the patient, evaluated laboratory and imaging results, formulated the assessment and plan and placed orders.    Allyne Gee, MD [facility] Pulmonary and Critical Care Sleep medicine

## 2018-12-17 NOTE — Patient Instructions (Signed)

## 2018-12-21 DIAGNOSIS — H903 Sensorineural hearing loss, bilateral: Secondary | ICD-10-CM | POA: Diagnosis not present

## 2018-12-21 DIAGNOSIS — H6123 Impacted cerumen, bilateral: Secondary | ICD-10-CM | POA: Diagnosis not present

## 2018-12-21 DIAGNOSIS — H6062 Unspecified chronic otitis externa, left ear: Secondary | ICD-10-CM | POA: Diagnosis not present

## 2018-12-22 DIAGNOSIS — I480 Paroxysmal atrial fibrillation: Secondary | ICD-10-CM | POA: Diagnosis not present

## 2018-12-22 DIAGNOSIS — I5031 Acute diastolic (congestive) heart failure: Secondary | ICD-10-CM | POA: Diagnosis not present

## 2018-12-22 DIAGNOSIS — I0981 Rheumatic heart failure: Secondary | ICD-10-CM | POA: Diagnosis not present

## 2018-12-22 DIAGNOSIS — I501 Left ventricular failure: Secondary | ICD-10-CM | POA: Diagnosis not present

## 2018-12-22 DIAGNOSIS — I110 Hypertensive heart disease with heart failure: Secondary | ICD-10-CM | POA: Diagnosis not present

## 2018-12-22 DIAGNOSIS — J918 Pleural effusion in other conditions classified elsewhere: Secondary | ICD-10-CM | POA: Diagnosis not present

## 2018-12-24 ENCOUNTER — Ambulatory Visit: Payer: Medicare Other | Admitting: Cardiovascular Disease

## 2018-12-24 ENCOUNTER — Telehealth: Payer: Self-pay

## 2018-12-24 ENCOUNTER — Encounter

## 2018-12-24 NOTE — Telephone Encounter (Signed)
CMN SIGNED AND PLACED IN [employer] PAT[employer] FOLDER. °

## 2018-12-27 ENCOUNTER — Other Ambulatory Visit: Payer: Self-pay | Admitting: Adult Health

## 2018-12-31 DIAGNOSIS — I480 Paroxysmal atrial fibrillation: Secondary | ICD-10-CM | POA: Diagnosis not present

## 2018-12-31 DIAGNOSIS — I501 Left ventricular failure: Secondary | ICD-10-CM | POA: Diagnosis not present

## 2018-12-31 DIAGNOSIS — I0981 Rheumatic heart failure: Secondary | ICD-10-CM | POA: Diagnosis not present

## 2018-12-31 DIAGNOSIS — I110 Hypertensive heart disease with heart failure: Secondary | ICD-10-CM | POA: Diagnosis not present

## 2018-12-31 DIAGNOSIS — J918 Pleural effusion in other conditions classified elsewhere: Secondary | ICD-10-CM | POA: Diagnosis not present

## 2018-12-31 DIAGNOSIS — I5031 Acute diastolic (congestive) heart failure: Secondary | ICD-10-CM | POA: Diagnosis not present

## 2019-01-02 ENCOUNTER — Other Ambulatory Visit: Payer: Self-pay | Admitting: Cardiovascular Disease

## 2019-01-06 ENCOUNTER — Other Ambulatory Visit: Payer: Self-pay | Admitting: Cardiovascular Disease

## 2019-01-13 DIAGNOSIS — I0981 Rheumatic heart failure: Secondary | ICD-10-CM | POA: Diagnosis not present

## 2019-01-13 DIAGNOSIS — J918 Pleural effusion in other conditions classified elsewhere: Secondary | ICD-10-CM | POA: Diagnosis not present

## 2019-01-13 DIAGNOSIS — I110 Hypertensive heart disease with heart failure: Secondary | ICD-10-CM | POA: Diagnosis not present

## 2019-01-13 DIAGNOSIS — I480 Paroxysmal atrial fibrillation: Secondary | ICD-10-CM | POA: Diagnosis not present

## 2019-01-13 DIAGNOSIS — I501 Left ventricular failure: Secondary | ICD-10-CM | POA: Diagnosis not present

## 2019-01-13 DIAGNOSIS — I5031 Acute diastolic (congestive) heart failure: Secondary | ICD-10-CM | POA: Diagnosis not present

## 2019-01-15 DIAGNOSIS — Z23 Encounter for immunization: Secondary | ICD-10-CM | POA: Diagnosis not present

## 2019-01-17 ENCOUNTER — Other Ambulatory Visit: Payer: Self-pay | Admitting: Internal Medicine

## 2019-01-22 ENCOUNTER — Telehealth: Payer: Self-pay

## 2019-01-22 NOTE — Telephone Encounter (Signed)
CMN SIGNED AND PLACED IN [employer] PAT[employer] FOLDER. °

## 2019-01-28 ENCOUNTER — Other Ambulatory Visit: Payer: Self-pay | Admitting: *Deleted

## 2019-01-28 ENCOUNTER — Telehealth: Payer: Self-pay | Admitting: Cardiovascular Disease

## 2019-01-28 MED ORDER — ROSUVASTATIN CALCIUM 5 MG PO TABS: 5.0000 mg | ORAL_TABLET | Freq: Every day | ORAL | 1 refills | Status: DC

## 2019-01-28 NOTE — Telephone Encounter (Signed)
Requested Prescriptions   Signed Prescriptions Disp Refills  . rosuvastatin (CRESTOR) 5 MG tablet 90 tablet 1    Sig: Take 1 tablet (5 mg total) by mouth daily at 6 PM.    Authorizing Provider: Kathlyn Sacramento A    Ordering User: Britt Bottom

## 2019-01-28 NOTE — Telephone Encounter (Signed)
°*  STAT* If patient is at the pharmacy, call can be transferred to refill team.   1. Which medications need to be refilled? (please list name of each medication and dose if known) rosuvastatin (CRESTOR) 5 MG 1 tablet daily    2. Which pharmacy/location (including street and city if local pharmacy) is medication to be sent to? [facility] on [address] - Not in [facility]  3. Do they need a 30 day or 90 day supply? 90 day

## 2019-01-30 ENCOUNTER — Other Ambulatory Visit: Payer: Self-pay | Admitting: Physician Assistant

## 2019-02-03 ENCOUNTER — Other Ambulatory Visit: Payer: Self-pay

## 2019-02-03 ENCOUNTER — Encounter: Payer: Self-pay | Admitting: Adult Health

## 2019-02-03 ENCOUNTER — Ambulatory Visit (INDEPENDENT_AMBULATORY_CARE_PROVIDER_SITE_OTHER): Payer: Medicare Other | Admitting: Adult Health

## 2019-02-03 VITALS — BP 128/70 | HR 68 | Temp 97.8°F | Resp 16 | Ht 62.0 in | Wt 127.0 lb

## 2019-02-03 DIAGNOSIS — R7303 Prediabetes: Secondary | ICD-10-CM | POA: Diagnosis not present

## 2019-02-03 DIAGNOSIS — I480 Paroxysmal atrial fibrillation: Secondary | ICD-10-CM | POA: Diagnosis not present

## 2019-02-03 DIAGNOSIS — J449 Chronic obstructive pulmonary disease, unspecified: Secondary | ICD-10-CM

## 2019-02-03 LAB — POCT GLYCOSYLATED HEMOGLOBIN (HGB A1C): Hemoglobin A1C: 5.1 % (ref 4.0–5.6)

## 2019-02-03 NOTE — Progress Notes (Addendum)
[facility] [place], [place] [address]  Internal MEDICINE  Office Visit Note  Patient Name: Ashley Weiss  [id number]  [id number]  Date of Service: 02/03/2019  Chief Complaint  Patient presents with  . Hypertension  . Hyperlipidemia  . Depression    HPI  PT is here for follow up on HTN, pre-DM, HLD and depression. Overall she is doing well.  Denies any blood pressure or depression issues.  Her pre-DM is well controlled with diet.  Her daughter is a former diabetes educator and has monitored that patients blood sugar and diet.  Her bp is well controlled.  Denies Chest pain, Shortness of breath, palpitations, headache, or blurred vision.     Current Medication: Outpatient Encounter Medications as of 02/03/2019  Medication Sig  . albuterol (PROVENTIL HFA;VENTOLIN HFA) 108 (90 Base) MCG/ACT inhaler Inhale 2 puffs into the lungs every 6 (six) hours as needed for wheezing or shortness of breath.  Marland Kitchen amiodarone (PACERONE) 200 MG tablet Take 1 tablet (200 mg total) by mouth daily.  Marland Kitchen apixaban (ELIQUIS) 2.5 MG TABS tablet Take 1 tablet (2.5 mg total) by mouth 2 (two) times daily.  . Blood Glucose Monitoring Suppl (FREESTYLE FREEDOM LITE) w/Device KIT 1 kit by Does not apply route daily.  Marland Kitchen denosumab (PROLIA) 60 MG/ML SOSY injection Inject 60 mg into the skin every 6 (six) months.  . diltiazem (CARDIZEM CD) 120 MG 24 hr capsule TAKE 1 CAPSULE BY MOUTH EVERY DAY  . fluticasone (FLONASE) 50 MCG/ACT nasal spray PLACE 1 SPRAY INTO BOTH NOSTRILS DAILY.  . fluticasone (FLOVENT HFA) 110 MCG/ACT inhaler Inhale 2 puffs into the lungs 2 (two) times daily.  . furosemide (LASIX) 40 MG tablet Take 1 tablet (40 mg) by mouth once daily as needed for weight gain, increased shortness of breath, or swelling  . glucose blood (FREESTYLE LITE) test strip Use as directed once a daily Diag E11.65  . ipratropium-albuterol (DUONEB) 0.5-2.5 (3) MG/3ML SOLN USE 1 VIAL 3 TIMES DAILY AS NEEDED  FOR SHORTNESS OF BREATH  . Lancets (FREESTYLE) lancets Use as directed once daily diag e11.65  . levothyroxine (SYNTHROID) 25 MCG tablet TAKE 1 DAILY BY MOUTH IN THE MORNING ON AN EMPTY STOMACH  . metoprolol tartrate (LOPRESSOR) 100 MG tablet Take 1.5 tablets twice daily. (Patient taking differently: Take 150 mg by mouth 2 (two) times daily. Take 1.5 tablets twice daily. )  . mometasone (ELOCON) 0.1 % lotion Apply 4 drops topically at bedtime as needed for itching.  . montelukast (SINGULAIR) 10 MG tablet TAKE 1 TABLET BY MOUTH EVERY DAY FOR COUGH  . Multiple Vitamins-Minerals (MULTIVITAMIN WITH MINERALS) tablet Take 1 tablet by mouth daily.  . OXYGEN Inhale 2 L into the lungs every evening. Sleep with nasal cannula w/ O2 at 2lpm.  . potassium chloride (KLOR-CON M10) 10 MEQ tablet TAKE 1 TABLET BY MOUTH EVERY DAY PRN ON DAYS WHEN TAKING LASIX  . Respiratory Therapy Supplies (ONE FLOW SPIROMETER) DEVI Please use device as tolerated and according to instruction provided.  . rosuvastatin (CRESTOR) 5 MG tablet Take 1 tablet (5 mg total) by mouth daily at 6 PM.  . sertraline (ZOLOFT) 100 MG tablet Take 1 tablet (100 mg total) by mouth daily.  . vitamin C (ASCORBIC ACID) 500 MG tablet Take 500 mg by mouth daily.   No facility-administered encounter medications on file as of 02/03/2019.     Surgical History: Past Surgical History:  Procedure Laterality Date  . BREAST CYST ASPIRATION  Left 20+ yrs ago  . CARDIOVERSION N/A 11/11/2018   Procedure: CARDIOVERSION;  Surgeon: Nelva Bush, MD;  Location: [facility] ORS;  Service: Cardiovascular;  Laterality: N/A;  . CATARACT EXTRACTION Bilateral [id number]  . cyst removal-right shoulder  1972  . otosclerosis Left 1988   hardening of bone, other 1989 redone  . THYROIDECTOMY  1970   nodule and 1/2 bridge removal  . WRIST SURGERY Left 11/29/2009    Medical History: Past Medical History:  Diagnosis Date  . Arthritis   . Asthma   . Cancer ([place])    skin   . Depression   . DVT (deep venous thrombosis) ([place])   . Hyperlipidemia   . Hypertension   . Phlebitis     Family History: Family History  Problem Relation Age of Onset  . Osteoarthritis Mother   . Depression Father   . Breast cancer Neg Hx     Social History   Socioeconomic History  . Marital status: Widowed    Spouse name: Not on file  . Number of children: Not on file  . Years of education: Not on file  . Highest education level: Not on file  Occupational History  . Not on file  Social Needs  . Financial resource strain: Not on file  . Food insecurity    Worry: Not on file    Inability: Not on file  . Transportation needs    Medical: Not on file    Non-medical: Not on file  Tobacco Use  . Smoking status: Never Smoker  . Smokeless tobacco: Never Used  Substance and Sexual Activity  . Alcohol use: No  . Drug use: No  . Sexual activity: Never  Lifestyle  . Physical activity    Days per week: Not on file    Minutes per session: Not on file  . Stress: Not on file  Relationships  . Social Herbalist on phone: Not on file    Gets together: Not on file    Attends religious service: Not on file    Active member of club or organization: Not on file    Attends meetings of clubs or organizations: Not on file    Relationship status: Not on file  . Intimate partner violence    Fear of current or ex partner: Not on file    Emotionally abused: Not on file    Physically abused: Not on file    Forced sexual activity: Not on file  Other Topics Concern  . Not on file  Social History Narrative  . Not on file      Review of Systems  Constitutional: Negative for chills, fatigue and unexpected weight change.  HENT: Negative for congestion, rhinorrhea, sneezing and sore throat.   Eyes: Negative for photophobia, pain and redness.  Respiratory: Negative for cough, chest tightness and shortness of breath.   Cardiovascular: Negative for chest pain and  palpitations.  Gastrointestinal: Negative for abdominal pain, constipation, diarrhea, nausea and vomiting.  Endocrine: Negative.   Genitourinary: Negative for dysuria and frequency.  Musculoskeletal: Negative for arthralgias, back pain, joint swelling and neck pain.  Skin: Negative for rash.  Allergic/Immunologic: Negative.   Neurological: Negative for tremors and numbness.  Hematological: Negative for adenopathy. Does not bruise/bleed easily.  Psychiatric/Behavioral: Negative for behavioral problems and sleep disturbance. The patient is not nervous/anxious.     Vital Signs: BP 128/70   Pulse 68   Temp 97.8 F (36.6 C)   Resp 16  Ht _0  (1.575 m)   Wt 127 lb (57.6 kg)   SpO2 98%   BMI 23.23 kg/m    Physical Exam Vitals signs and nursing note reviewed.  Constitutional:      General: She is not in acute distress.    Appearance: She is well-developed. She is not diaphoretic.  HENT:     Head: Normocephalic and atraumatic.     Mouth/Throat:     Pharynx: No oropharyngeal exudate.  Eyes:     Pupils: Pupils are equal, round, and reactive to light.  Neck:     Musculoskeletal: Normal range of motion and neck supple.     Thyroid: No thyromegaly.     Vascular: No JVD.     Trachea: No tracheal deviation.  Cardiovascular:     Rate and Rhythm: Normal rate and regular rhythm.     Heart sounds: Normal heart sounds. No murmur. No friction rub. No gallop.   Pulmonary:     Effort: Pulmonary effort is normal. No respiratory distress.     Breath sounds: Normal breath sounds. No wheezing or rales.  Chest:     Chest wall: No tenderness.  Abdominal:     Palpations: Abdomen is soft.     Tenderness: There is no abdominal tenderness. There is no guarding.  Musculoskeletal: Normal range of motion.  Lymphadenopathy:     Cervical: No cervical adenopathy.  Skin:    General: Skin is warm and dry.  Neurological:     Mental Status: She is alert and oriented to person, place, and time.      Cranial Nerves: No cranial nerve deficit.  Psychiatric:        Behavior: Behavior normal.        Thought Content: Thought content normal.        Judgment: Judgment normal.    Assessment/Plan: 1. Pre-diabetes A1C is well controlled, at 5.1.  Will continue to follow in future. - POCT HgB A1C  2. Chronic obstructive pulmonary disease, unspecified COPD type ([place]) Stable, continue present management.   3. Paroxysmal atrial fibrillation (HCC) Stable, continue to follow up with cardiology as scheduled.   General Counseling: Janaiah verbalizes understanding of the findings of todays visit and agrees with plan of treatment. I have discussed any further diagnostic evaluation that may be needed or ordered today. We also reviewed her medications today. she has been encouraged to call the office with any questions or concerns that should arise related to todays visit.    Orders Placed This Encounter  Procedures  . POCT HgB A1C    No orders of the defined types were placed in this encounter.   Time spent: 25 Minutes   This patient was seen by Orson Gear AGNP-C in Collaboration with Dr Lavera Guise as a part of collaborative care agreement     Kendell Bane AGNP-C Internal medicine

## 2019-02-09 ENCOUNTER — Other Ambulatory Visit: Payer: Self-pay

## 2019-02-09 ENCOUNTER — Other Ambulatory Visit: Payer: Self-pay | Admitting: Physician Assistant

## 2019-02-09 ENCOUNTER — Ambulatory Visit (INDEPENDENT_AMBULATORY_CARE_PROVIDER_SITE_OTHER): Payer: Medicare Other | Admitting: Cardiovascular Disease

## 2019-02-09 ENCOUNTER — Encounter: Payer: Self-pay | Admitting: Cardiovascular Disease

## 2019-02-09 VITALS — BP 140/70 | HR 63 | Temp 99.5°F | Ht 62.0 in | Wt 127.0 lb

## 2019-02-09 DIAGNOSIS — I10 Essential (primary) hypertension: Secondary | ICD-10-CM | POA: Diagnosis not present

## 2019-02-09 DIAGNOSIS — I5032 Chronic diastolic (congestive) heart failure: Secondary | ICD-10-CM | POA: Diagnosis not present

## 2019-02-09 DIAGNOSIS — I6523 Occlusion and stenosis of bilateral carotid arteries: Secondary | ICD-10-CM

## 2019-02-09 DIAGNOSIS — I4891 Unspecified atrial fibrillation: Secondary | ICD-10-CM

## 2019-02-09 MED ORDER — METOPROLOL TARTRATE 100 MG PO TABS: 100.0000 mg | ORAL_TABLET | Freq: Two times a day (BID) | ORAL | Status: DC

## 2019-02-09 NOTE — Progress Notes (Signed)
Cardiology Office Note   Date:  02/09/2019   ID:  Ashley Weiss, Alferd Apa 04-Jun-1919, MRN [id number]  PCP:  Lavera Guise, MD  Cardiologist:   Kathlyn Sacramento, MD   Chief Complaint  Patient presents with  . office visit    2 mo F/U-Atrial Flutter      History of Present Illness: Ashley Weiss is a 85 y.o. female who is here today for follow-up visit regarding persistent atrial fibrillation maintaining in sinus rhythm with amiodarone and chronic diastolic heart failure.  She has history of hyperlipidemia, essential hypertension, hyperlipidemia and persistent atrial fibrillation.  She  Rate control was difficult on her and ultimately she underwent successful cardioversion in July after stopping amiodarone. Echocardiogram in July showed an EF of 55 to 60% with moderately dilated left atrium, moderate pulmonary hypertension and moderate tricuspid regurgitation. She has been doing well with no recent chest pain or worsening dyspnea.  She continues to require supplemental oxygen.  Chest x-ray in August showed no active cardiopulmonary disease.  She saw Dr. Humphrey Rolls recently to consider pulmonary function testing given that she is on amiodarone but the patient feels that she is still weak to undergo pulmonary function testing.  She does feel that she is improving gradually.   Past Medical History:  Diagnosis Date  . Arthritis   . Asthma   . Cancer ([place])    skin  . Depression   . DVT (deep venous thrombosis) ([place])   . Hyperlipidemia   . Hypertension   . Phlebitis     Past Surgical History:  Procedure Laterality Date  . BREAST CYST ASPIRATION Left 20+ yrs ago  . CARDIOVERSION N/A 11/11/2018   Procedure: CARDIOVERSION;  Surgeon: Nelva Bush, MD;  Location: [facility] ORS;  Service: Cardiovascular;  Laterality: N/A;  . CATARACT EXTRACTION Bilateral [id number]  . cyst removal-right shoulder  1972  . otosclerosis Left 1988   hardening of bone, other 1989 redone  . THYROIDECTOMY  1970   nodule and 1/2 bridge removal  . WRIST SURGERY Left 11/29/2009     Current Outpatient Medications  Medication Sig Dispense Refill  . albuterol (PROVENTIL HFA;VENTOLIN HFA) 108 (90 Base) MCG/ACT inhaler Inhale 2 puffs into the lungs every 6 (six) hours as needed for wheezing or shortness of breath. 1 Inhaler 5  . amiodarone (PACERONE) 200 MG tablet Take 1 tablet (200 mg total) by mouth daily. 30 tablet 0  . apixaban (ELIQUIS) 2.5 MG TABS tablet Take 1 tablet (2.5 mg total) by mouth 2 (two) times daily. 30 tablet 5  . Blood Glucose Monitoring Suppl (FREESTYLE FREEDOM LITE) w/Device KIT 1 kit by Does not apply route daily. 1 each 0  . denosumab (PROLIA) 60 MG/ML SOSY injection Inject 60 mg into the skin every 6 (six) months.    . diltiazem (CARDIZEM CD) 120 MG 24 hr capsule TAKE 1 CAPSULE BY MOUTH EVERY DAY 30 capsule 5  . Fluocinolone Acetonide 0.01 % OIL Apply 4 drops to eye at bedtime as needed.    . fluticasone (FLONASE) 50 MCG/ACT nasal spray PLACE 1 SPRAY INTO BOTH NOSTRILS DAILY. 48 mL 2  . fluticasone (FLOVENT HFA) 110 MCG/ACT inhaler Inhale 2 puffs into the lungs 2 (two) times daily. 3 Inhaler 3  . furosemide (LASIX) 40 MG tablet Take 1 tablet (40 mg) by mouth once daily as needed for weight gain, increased shortness of breath, or swelling    . glucose blood (FREESTYLE LITE) test strip Use as directed once a  daily Diag E11.65 100 each 12  . ipratropium-albuterol (DUONEB) 0.5-2.5 (3) MG/3ML SOLN USE 1 VIAL 3 TIMES DAILY AS NEEDED FOR SHORTNESS OF BREATH 810 mL 0  . Lancets (FREESTYLE) lancets Use as directed once daily diag e11.65 100 each 12  . levothyroxine (SYNTHROID) 25 MCG tablet TAKE 1 DAILY BY MOUTH IN THE MORNING ON AN EMPTY STOMACH 90 tablet 1  . metoprolol tartrate (LOPRESSOR) 100 MG tablet Take 1.5 tablets twice daily. (Patient taking differently: Take 150 mg by mouth 2 (two) times daily. Take 1.5 tablets twice daily. ) 270 tablet 1  . montelukast (SINGULAIR) 10 MG tablet TAKE 1  TABLET BY MOUTH EVERY DAY FOR COUGH 90 tablet 1  . Multiple Vitamins-Minerals (MULTIVITAMIN WITH MINERALS) tablet Take 1 tablet by mouth daily.    . OXYGEN Inhale 2 L into the lungs every evening. Sleep with nasal cannula w/ O2 at 2lpm.    . potassium chloride (KLOR-CON M10) 10 MEQ tablet TAKE 1 TABLET BY MOUTH EVERY DAY PRN ON DAYS WHEN TAKING LASIX 90 tablet 0  . Respiratory Therapy Supplies (ONE FLOW SPIROMETER) DEVI Please use device as tolerated and according to instruction provided. 1 Device 0  . rosuvastatin (CRESTOR) 5 MG tablet Take 1 tablet (5 mg total) by mouth daily at 6 PM. 90 tablet 1  . sertraline (ZOLOFT) 100 MG tablet Take 1 tablet (100 mg total) by mouth daily. 90 tablet 2  . vitamin C (ASCORBIC ACID) 500 MG tablet Take 500 mg by mouth daily.    . mometasone (ELOCON) 0.1 % lotion Apply 4 drops topically at bedtime as needed for itching.     No current facility-administered medications for this visit.     Allergies:   Hydralazine, Biaxin [clarithromycin], Ciprofloxacin, Meloxicam, and Minocycline    Social History:  The patient  reports that she has never smoked. She has never used smokeless tobacco. She reports that she does not drink alcohol or use drugs.   Family History:  The patient's family history includes Depression in her father; Osteoarthritis in her mother.    ROS:  Please see the history of present illness.   Otherwise, review of systems are positive for none.   All other systems are reviewed and negative.    PHYSICAL EXAM: VS:  BP 140/70 (BP Location: Right Arm, Patient Position: Sitting, Cuff Size: Normal)   Pulse 63   Temp 99.5 F (37.5 C)   Ht _0  (1.575 m)   Wt 127 lb (57.6 kg)   SpO2 99% Comment: on 2L O2  BMI 23.23 kg/m  , BMI Body mass index is 23.23 kg/m. GEN: Well nourished, well developed, in no acute distress  HEENT: normal  Neck: no JVD, carotid bruits, or masses Cardiac: Regular rate and rhythm; no murmurs, rubs, or gallops,no edema   Respiratory:  clear to auscultation bilaterally, normal work of breathing GI: soft, nontender, nondistended, + BS MS: no deformity or atrophy  Skin: warm and dry, no rash Neuro:  Strength and sensation are intact Psych: euthymic mood, full affect   EKG:  EKG is ordered today. The ekg ordered today demonstrates sinus rhythm with first-degree AV block, minimal LVH.   Recent Labs: 11/10/2018: Magnesium 2.1 11/11/2018: B Natriuretic Peptide 283.0; TSH 4.231 11/26/2018: ALT 25; Hemoglobin 13.0; Platelets 255 12/10/2018: BUN 16; Creatinine, Ser 0.55; Potassium 4.3; Sodium 140    Lipid Panel    Component Value Date/Time   CHOL 79 11/11/2018 0317   CHOL 119 10/09/2018 0848  TRIG 34 11/11/2018 0317   HDL 30 (L) 11/11/2018 0317   HDL 38 (L) 10/09/2018 0848   CHOLHDL 2.6 11/11/2018 0317   VLDL 7 11/11/2018 0317   LDLCALC 42 11/11/2018 0317   LDLCALC 57 10/09/2018 0848      Wt Readings from Last 3 Encounters:  02/09/19 127 lb (57.6 kg)  02/03/19 127 lb (57.6 kg)  12/17/18 120 lb 6.4 oz (54.6 kg)      PAD Screen 10/29/2018  Previous PAD dx? No  Previous surgical procedure? No  Pain with walking? No  Feet/toe relief with dangling? No  Painful, non-healing ulcers? No  Extremities discolored? No      ASSESSMENT AND PLAN:  1.  Persistent atrial fibrillation/flutter: She is maintaining in sinus rhythm with amiodarone.  There was concern about interstitial lung disease on previous x-ray from July but most recent x-rays were unremarkable and the patient is clinically improving.  I think the benefit of amiodarone outweighs the risk at the present time considering how difficult rate control was on her. Today, I elected to decrease metoprolol to 100 mg twice daily to avoid bradycardia given that she is on amiodarone and diltiazem also.  2.  Essential hypertension: Blood pressure is reasonably controlled.  3.  Chronic diastolic heart failure she appears to be euvolemic.  She uses  furosemide as needed and has not required this.    Disposition:   FU with me in 3 month.  Signed,  Kathlyn Sacramento, MD  02/09/2019 2:30 PM    [place]

## 2019-02-09 NOTE — Patient Instructions (Addendum)
Medication Instructions:  Your physician has recommended you make the following change in your medication:   REDUCE Metoprolol to 100mg  twice daily.  *If you need a refill on your cardiac medications before your next appointment, please call your pharmacy*  Lab Work: None ordered If you have labs (blood work) drawn today and your tests are completely normal, you will receive your results only by: Marland Kitchen MyChart Message (if you have MyChart) OR . A paper copy in the mail If you have any lab test that is abnormal or we need to change your treatment, we will call you to review the results.  Testing/Procedures: None ordered  Follow-Up: At [facility], you and your health needs are our priority.  As part of our continuing mission to provide you with exceptional heart care, we have created designated Provider Care Teams.  These Care Teams include your primary Cardiologist (physician) and Advanced Practice Providers (APPs -  Physician Assistants and Nurse Practitioners) who all work together to provide you with the care you need, when you need it.  Your next appointment:   3 months  The format for your next appointment:   In Person  Provider:    You may see Kathlyn Sacramento, MD or one of the following Advanced Practice Providers on your designated Care Team:    Murray Hodgkins, NP  Christell Faith, PA-C  Marrianne Mood, PA-C   Other Instructions N/A

## 2019-02-18 ENCOUNTER — Ambulatory Visit: Payer: Self-pay | Admitting: Internal Medicine

## 2019-02-22 ENCOUNTER — Telehealth: Payer: Self-pay

## 2019-02-22 NOTE — Telephone Encounter (Signed)
Pt daughter called to inform us that she canceled pt PFT until pt regains strength and was advised by cardiologist of this.

## 2019-02-24 ENCOUNTER — Ambulatory Visit: Payer: Medicare Other | Admitting: Internal Medicine

## 2019-03-02 ENCOUNTER — Telehealth: Payer: Self-pay

## 2019-03-02 NOTE — Telephone Encounter (Signed)
CMN SIGNED AND PLACED IN [employer] PAT[employer] FOLDER. °

## 2019-03-03 NOTE — Addendum Note (Signed)
Addended by: Kendell Bane on: 03/03/2019 12:35 PM   Modules accepted: Level of Service

## 2019-03-09 ENCOUNTER — Telehealth: Payer: Self-pay

## 2019-03-09 NOTE — Telephone Encounter (Signed)
Confirmed appointment with daughter. klh

## 2019-03-09 NOTE — Telephone Encounter (Signed)
PLAN OF CARE SIGNED AND MAILED TO [facility]. 

## 2019-03-09 NOTE — Telephone Encounter (Signed)
CMN SIGNED AND PLACED IN [employer] PAT[employer] FOLDER. °

## 2019-03-11 ENCOUNTER — Encounter: Payer: Self-pay | Admitting: Internal Medicine

## 2019-03-11 ENCOUNTER — Other Ambulatory Visit: Payer: Self-pay

## 2019-03-11 ENCOUNTER — Ambulatory Visit (INDEPENDENT_AMBULATORY_CARE_PROVIDER_SITE_OTHER): Payer: Medicare Other | Admitting: Internal Medicine

## 2019-03-11 VITALS — BP 140/68 | HR 66 | Temp 97.9°F | Resp 16 | Ht 62.0 in | Wt 128.0 lb

## 2019-03-11 DIAGNOSIS — I480 Paroxysmal atrial fibrillation: Secondary | ICD-10-CM | POA: Diagnosis not present

## 2019-03-11 DIAGNOSIS — I5030 Unspecified diastolic (congestive) heart failure: Secondary | ICD-10-CM | POA: Diagnosis not present

## 2019-03-11 DIAGNOSIS — I6523 Occlusion and stenosis of bilateral carotid arteries: Secondary | ICD-10-CM | POA: Diagnosis not present

## 2019-03-11 DIAGNOSIS — R0602 Shortness of breath: Secondary | ICD-10-CM

## 2019-03-11 NOTE — Progress Notes (Signed)
[facility] [place], [place] [address]  Pulmonary Sleep Medicine   Office Visit Note  Patient Name: Ashley Weiss DOB: 12/26/1919 MRN [id number]  Date of Service: 03/11/2019  Complaints/HPI: Generally she has been doing well no admissions to the hospital.  She denies having any cough no congestion.  Denies having any fevers no chills.  No chest pain.  Daughter states that time she was wondering about her being able to live on her own.  Explained to her that due to her age and her general frailness is probably not a good idea for her to be living entirely by herself so the family is going to apparently take care of her closer to home.  ROS  General: (-) fever, (-) chills, (-) night sweats, (-) weakness Skin: (-) rashes, (-) itching,. Eyes: (-) visual changes, (-) redness, (-) itching. Nose and Sinuses: (-) nasal stuffiness or itchiness, (-) postnasal drip, (-) nosebleeds, (-) sinus trouble. Mouth and Throat: (-) sore throat, (-) hoarseness. Neck: (-) swollen glands, (-) enlarged thyroid, (-) neck pain. Respiratory: - cough, (-) bloody sputum, - shortness of breath, - wheezing. Cardiovascular: - ankle swelling, (-) chest pain. Lymphatic: (-) lymph node enlargement. Neurologic: (-) numbness, (-) tingling. Psychiatric: (-) anxiety, (-) depression   Current Medication: Outpatient Encounter Medications as of 03/11/2019  Medication Sig  . albuterol (PROVENTIL HFA;VENTOLIN HFA) 108 (90 Base) MCG/ACT inhaler Inhale 2 puffs into the lungs every 6 (six) hours as needed for wheezing or shortness of breath.  Marland Kitchen amiodarone (PACERONE) 200 MG tablet Take 1 tablet (200 mg total) by mouth daily.  Marland Kitchen apixaban (ELIQUIS) 2.5 MG TABS tablet Take 1 tablet (2.5 mg total) by mouth 2 (two) times daily.  . Blood Glucose Monitoring Suppl (FREESTYLE FREEDOM LITE) w/Device KIT 1 kit by Does not apply route daily.  Marland Kitchen denosumab (PROLIA) 60 MG/ML SOSY injection Inject 60 mg into the  skin every 6 (six) months.  . diltiazem (CARDIZEM CD) 120 MG 24 hr capsule TAKE 1 CAPSULE BY MOUTH EVERY DAY  . Fluocinolone Acetonide 0.01 % OIL Apply 4 drops to eye at bedtime as needed.  . fluticasone (FLONASE) 50 MCG/ACT nasal spray PLACE 1 SPRAY INTO BOTH NOSTRILS DAILY.  . fluticasone (FLOVENT HFA) 110 MCG/ACT inhaler Inhale 2 puffs into the lungs 2 (two) times daily.  . furosemide (LASIX) 40 MG tablet Take 1 tablet (40 mg) by mouth once daily as needed for weight gain, increased shortness of breath, or swelling  . glucose blood (FREESTYLE LITE) test strip Use as directed once a daily Diag E11.65  . ipratropium-albuterol (DUONEB) 0.5-2.5 (3) MG/3ML SOLN USE 1 VIAL 3 TIMES DAILY AS NEEDED FOR SHORTNESS OF BREATH  . Lancets (FREESTYLE) lancets Use as directed once daily diag e11.65  . levothyroxine (SYNTHROID) 25 MCG tablet TAKE 1 DAILY BY MOUTH IN THE MORNING ON AN EMPTY STOMACH  . metoprolol tartrate (LOPRESSOR) 100 MG tablet Take 1 tablet (100 mg total) by mouth 2 (two) times daily.  . mometasone (ELOCON) 0.1 % lotion Apply 4 drops topically at bedtime as needed for itching.  . montelukast (SINGULAIR) 10 MG tablet TAKE 1 TABLET BY MOUTH EVERY DAY FOR COUGH  . Multiple Vitamins-Minerals (MULTIVITAMIN WITH MINERALS) tablet Take 1 tablet by mouth daily.  . OXYGEN Inhale 2 L into the lungs every evening. Sleep with nasal cannula w/ O2 at 2lpm.  . potassium chloride (KLOR-CON M10) 10 MEQ tablet TAKE 1 TABLET BY MOUTH EVERY DAY PRN ON DAYS WHEN  TAKING LASIX  . Respiratory Therapy Supplies (ONE FLOW SPIROMETER) DEVI Please use device as tolerated and according to instruction provided.  . rosuvastatin (CRESTOR) 5 MG tablet Take 1 tablet (5 mg total) by mouth daily at 6 PM.  . sertraline (ZOLOFT) 100 MG tablet Take 1 tablet (100 mg total) by mouth daily.  . vitamin C (ASCORBIC ACID) 500 MG tablet Take 500 mg by mouth daily.   No facility-administered encounter medications on file as of 03/11/2019.      Surgical History: Past Surgical History:  Procedure Laterality Date  . BREAST CYST ASPIRATION Left 20+ yrs ago  . CARDIOVERSION N/A 11/11/2018   Procedure: CARDIOVERSION;  Surgeon: Nelva Bush, MD;  Location: [facility] ORS;  Service: Cardiovascular;  Laterality: N/A;  . CATARACT EXTRACTION Bilateral [id number]  . cyst removal-right shoulder  1972  . otosclerosis Left 1988   hardening of bone, other 1989 redone  . THYROIDECTOMY  1970   nodule and 1/2 bridge removal  . WRIST SURGERY Left 11/29/2009    Medical History: Past Medical History:  Diagnosis Date  . Arthritis   . Asthma   . Cancer ([place])    skin  . Depression   . DVT (deep venous thrombosis) ([place])   . Hyperlipidemia   . Hypertension   . Phlebitis     Family History: Family History  Problem Relation Age of Onset  . Osteoarthritis Mother   . Depression Father   . Breast cancer Neg Hx     Social History: Social History   Socioeconomic History  . Marital status: Widowed    Spouse name: Not on file  . Number of children: Not on file  . Years of education: Not on file  . Highest education level: Not on file  Occupational History  . Not on file  Social Needs  . Financial resource strain: Not on file  . Food insecurity    Worry: Not on file    Inability: Not on file  . Transportation needs    Medical: Not on file    Non-medical: Not on file  Tobacco Use  . Smoking status: Never Smoker  . Smokeless tobacco: Never Used  Substance and Sexual Activity  . Alcohol use: No  . Drug use: No  . Sexual activity: Never  Lifestyle  . Physical activity    Days per week: Not on file    Minutes per session: Not on file  . Stress: Not on file  Relationships  . Social Herbalist on phone: Not on file    Gets together: Not on file    Attends religious service: Not on file    Active member of club or organization: Not on file    Attends meetings of clubs or organizations: Not on file    Relationship  status: Not on file  . Intimate partner violence    Fear of current or ex partner: Not on file    Emotionally abused: Not on file    Physically abused: Not on file    Forced sexual activity: Not on file  Other Topics Concern  . Not on file  Social History Narrative  . Not on file    Vital Signs: Blood pressure 140/68, pulse 66, temperature 97.9 F (36.6 C), resp. rate 16, height _0  (1.575 m), weight 128 lb (58.1 kg), SpO2 93 %.  Examination: General Appearance: The patient is well-developed, well-nourished, and in no distress. Skin: Gross inspection of skin unremarkable. Head: normocephalic, no  gross deformities. Eyes: no gross deformities noted. ENT: ears appear grossly normal no exudates. Neck: Supple. No thyromegaly. No LAD. Respiratory: no rhonchi noted. Cardiovascular: Normal S1 and S2 without murmur or rub. Extremities: No cyanosis. pulses are equal. Neurologic: Alert and oriented. No involuntary movements.  LABS: Recent Results (from the past 2160 hour(s))  POCT HgB A1C     Status: None   Collection Time: 02/03/19 10:39 AM  Result Value Ref Range   Hemoglobin A1C 5.1 4.0 - 5.6 %   HbA1c POC (<> result, manual entry)     HbA1c, POC (prediabetic range)     HbA1c, POC (controlled diabetic range)      Radiology: Dg Chest 2 View  Result Date: 11/26/2018 CLINICAL DATA:  Bibasilar crackles. Occasional nonproductive cough. EXAM: CHEST - 2 VIEW COMPARISON:  11/14/2018 FINDINGS: Chronic cardiomegaly. Pulmonary vascularity is normal. Lungs are clear. Multiple old right posterior rib fractures. No acute bone abnormality. Aortic atherosclerosis. No effusions. IMPRESSION: 1. No acute cardiopulmonary disease. Chronic cardiomegaly. 2. Aortic atherosclerosis. Electronically Signed   By: Lorriane Shire M.D.   On: 11/26/2018 16:50    No results found.  No results found.    Assessment and Plan: Patient Active Problem List   Diagnosis Date Noted  . Acute heart failure with  preserved ejection fraction (HFpEF) ([place])   . Shortness of breath 10/21/2018  . Atrial fibrillation with rapid ventricular response ([place]) 10/10/2018  . Atrial fibrillation with RVR ([place]) 10/10/2018  . Nocturnal hypoxemia due to asthma 10/29/2017  . Acute pain of right wrist 09/30/2017  . Hyperlipidemia 07/23/2017  . Osteoporosis, post-menopausal 07/03/2017  . Chronic obstructive pulmonary disease, unspecified ([place]) 05/28/2017  . Pain in left knee 05/28/2017  . Essential (primary) hypertension 05/28/2017  . Cardiac arrhythmia 05/28/2017  . Hypothyroidism 05/28/2017  . Injury of right hip 06/15/2015  . Mild intermittent asthma without complication 45/40/9811    1. Atrial fibrillation this is currently rate controlled she will continue to follow with her primary cardiologist.  They did have a question regarding the amiodarone and doing the pulmonary functions they would want to defer the pulmonary functions.  I explained to them that although we routinely would recommend someone that is on amiodarone to get a DLCO done based on her age and being asymptomatic so far a we will forego the DLCO check per their request.  They do understand that there is always potential for lung toxicity. 2. SOB she is basically at her baseline we will continue with supportive care inhalers as necessary 3. Chronic heart failure HFpEF being managed by her primary cardiologist time she will follow up with the primary cardiologist  General Counseling: I have discussed the findings of the evaluation and examination with Ane.  I have also discussed any further diagnostic evaluation thatmay be needed or ordered today. Kady verbalizes understanding of the findings of todays visit. We also reviewed her medications today and discussed drug interactions and side effects including but not limited excessive drowsiness and altered mental states. We also discussed that there is always a risk not just to her but also people around  her. she has been encouraged to call the office with any questions or concerns that should arise related to todays visit.  No orders of the defined types were placed in this encounter.   Time spent: 25 minutes  I have personally obtained a history, examined the patient, evaluated laboratory and imaging results, formulated the assessment and plan and placed orders.    Katlen Seyer  Richardson Dopp, MD [facility] Sleep medicine

## 2019-03-11 NOTE — Patient Instructions (Signed)
Atrial Fibrillation ° °Atrial fibrillation is a type of heartbeat that is irregular or fast (rapid). If you have this condition, your heart beats without any order. This makes it hard for your heart to pump blood in a normal way. Having this condition gives you more risk for stroke, heart failure, and other heart problems. °Atrial fibrillation may start all of a sudden and then stop on its own, or it may become a long-lasting problem. °What are the causes? °This condition may be caused by heart conditions, such as: °· High blood pressure. °· Heart failure. °· Heart valve disease. °· Heart surgery. °Other causes include: °· Pneumonia. °· Obstructive sleep apnea. °· Lung cancer. °· Thyroid disease. °· Drinking too much alcohol. °Sometimes the cause is not known. °What increases the risk? °You are more likely to develop this condition if: °· You smoke. °· You are older. °· You have diabetes. °· You are overweight. °· You have a family history of this condition. °· You exercise often and hard. °What are the signs or symptoms? °Common symptoms of this condition include: °· A feeling like your heart is beating very fast. °· Chest pain. °· Feeling short of breath. °· Feeling light-headed or weak. °· Getting tired easily. °Follow these instructions at home: °Medicines °· Take over-the-counter and prescription medicines only as told by your doctor. °· If your doctor gives you a blood-thinning medicine, take it exactly as told. Taking too much of it can cause bleeding. Taking too little of it does not protect you against clots. Clots can cause a stroke. °Lifestyle ° °  ° °· Do not use any tobacco products. These include cigarettes, chewing tobacco, and e-cigarettes. If you need help quitting, ask your doctor. °· Do not drink alcohol. °· Do not drink beverages that have caffeine. These include coffee, soda, and tea. °· Follow diet instructions as told by your doctor. °· Exercise regularly as told by your doctor. °General  instructions °· If you have a condition that causes breathing to stop for a short period of time (apnea), treat it as told by your doctor. °· Keep a healthy weight. Do not use diet pills unless your doctor says they are safe for you. Diet pills may make heart problems worse. °· Keep all follow-up visits as told by your doctor. This is important. °Contact a doctor if: °· You notice a change in the speed, rhythm, or strength of your heartbeat. °· You are taking a blood-thinning medicine and you see more bruising. °· You get tired more easily when you move or exercise. °· You have a sudden change in weight. °Get help right away if: ° °· You have pain in your chest or your belly (abdomen). °· You have trouble breathing. °· You have blood in your vomit, poop, or pee (urine). °· You have any signs of a stroke. "BE FAST" is an easy way to remember the main warning signs: °? B - Balance. Signs are dizziness, sudden trouble walking, or loss of balance. °? E - Eyes. Signs are trouble seeing or a change in how you see. °? F - Face. Signs are sudden weakness or loss of feeling in the face, or the face or eyelid drooping on one side. °? A - Arms. Signs are weakness or loss of feeling in an arm. This happens suddenly and usually on one side of the body. °? S - Speech. Signs are sudden trouble speaking, slurred speech, or trouble understanding what people say. °? T - Time.   Time to call emergency services. Write down what time symptoms started. °· You have other signs of a stroke, such as: °? A sudden, very bad headache with no known cause. °? Feeling sick to your stomach (nausea). °? Throwing up (vomiting). °? Jerky movements you cannot control (seizure). °These symptoms may be an emergency. Do not wait to see if the symptoms will go away. Get medical help right away. Call your local emergency services (911 in the [place] Do not drive yourself to the hospital. °Summary °· Atrial fibrillation is a type of heartbeat that is irregular  or fast (rapid). °· You are at higher risk of this condition if you smoke, are older, have diabetes, or are overweight. °· Follow your doctor's instructions about medicines, diet, exercise, and follow-up visits. °· Get help right away if you think that you have signs of a stroke. °This information is not intended to replace advice given to you by your health care provider. Make sure you discuss any questions you have with your health care provider. °Document Released: 01/16/2008 Document Revised: 06/12/2017 Document Reviewed: 05/30/2017 °Elsevier Patient Education © 2020 [employer] Inc[employer] ° °

## 2019-03-25 DIAGNOSIS — H6060 Unspecified chronic otitis externa, unspecified ear: Secondary | ICD-10-CM | POA: Diagnosis not present

## 2019-03-25 DIAGNOSIS — H6062 Unspecified chronic otitis externa, left ear: Secondary | ICD-10-CM | POA: Diagnosis not present

## 2019-03-25 DIAGNOSIS — H6123 Impacted cerumen, bilateral: Secondary | ICD-10-CM | POA: Diagnosis not present

## 2019-03-25 DIAGNOSIS — H903 Sensorineural hearing loss, bilateral: Secondary | ICD-10-CM | POA: Diagnosis not present

## 2019-03-25 DIAGNOSIS — H90A31 Mixed conductive and sensorineural hearing loss, unilateral, right ear with restricted hearing on the contralateral side: Secondary | ICD-10-CM | POA: Diagnosis not present

## 2019-03-26 ENCOUNTER — Telehealth: Payer: Self-pay

## 2019-03-26 NOTE — Telephone Encounter (Signed)
CONFIRMED AND SCREENED FOR COVID FOR 03-30-19 OV.

## 2019-03-30 ENCOUNTER — Other Ambulatory Visit: Payer: Self-pay

## 2019-03-30 ENCOUNTER — Encounter: Payer: Self-pay | Admitting: Internal Medicine

## 2019-03-30 ENCOUNTER — Ambulatory Visit (INDEPENDENT_AMBULATORY_CARE_PROVIDER_SITE_OTHER): Payer: Medicare Other | Admitting: Internal Medicine

## 2019-03-30 VITALS — BP 153/66 | HR 64 | Resp 16 | Ht 62.0 in | Wt 129.6 lb

## 2019-03-30 DIAGNOSIS — Z0001 Encounter for general adult medical examination with abnormal findings: Secondary | ICD-10-CM | POA: Diagnosis not present

## 2019-03-30 DIAGNOSIS — I5043 Acute on chronic combined systolic (congestive) and diastolic (congestive) heart failure: Secondary | ICD-10-CM

## 2019-03-30 DIAGNOSIS — R300 Dysuria: Secondary | ICD-10-CM | POA: Diagnosis not present

## 2019-03-30 DIAGNOSIS — R0602 Shortness of breath: Secondary | ICD-10-CM

## 2019-03-30 DIAGNOSIS — Z1231 Encounter for screening mammogram for malignant neoplasm of breast: Secondary | ICD-10-CM

## 2019-03-30 DIAGNOSIS — I480 Paroxysmal atrial fibrillation: Secondary | ICD-10-CM

## 2019-03-30 DIAGNOSIS — J4520 Mild intermittent asthma, uncomplicated: Secondary | ICD-10-CM

## 2019-03-30 NOTE — Progress Notes (Signed)
[facility] [place], [place] [address]  Internal MEDICINE  Office Visit Note  Patient Name: Ashley Weiss  [id number]  [id number]  Date of Service: 04/05/2019  Chief Complaint  Patient presents with  . Medicare Wellness  . Hypertension  . Hyperlipidemia  . Asthma  . Arthritis   HPI Pt is here for routine health maintenance examination. Feels well, living with her daughter now due to the recent visit and admission to [facility]. She went in atrial fib with RVR, acute CHF, Became hypoxic as well. She is discharged home on oxygen. Pt also has asthma but it is controlled. She is hard of hearing but does well with her daughter's interpretation.   She will need follow up PFT's and CXR  Current Medication: Outpatient Encounter Medications as of 03/30/2019  Medication Sig  . albuterol (PROVENTIL HFA;VENTOLIN HFA) 108 (90 Base) MCG/ACT inhaler Inhale 2 puffs into the lungs every 6 (six) hours as needed for wheezing or shortness of breath.  Marland Kitchen amiodarone (PACERONE) 200 MG tablet Take 1 tablet (200 mg total) by mouth daily.  Marland Kitchen apixaban (ELIQUIS) 2.5 MG TABS tablet Take 1 tablet (2.5 mg total) by mouth 2 (two) times daily.  . Blood Glucose Monitoring Suppl (FREESTYLE FREEDOM LITE) w/Device KIT 1 kit by Does not apply route daily.  Marland Kitchen denosumab (PROLIA) 60 MG/ML SOSY injection Inject 60 mg into the skin every 6 (six) months.  . diltiazem (CARDIZEM CD) 120 MG 24 hr capsule TAKE 1 CAPSULE BY MOUTH EVERY DAY  . Fluocinolone Acetonide 0.01 % OIL Apply 4 drops to eye at bedtime as needed.  . fluticasone (FLONASE) 50 MCG/ACT nasal spray PLACE 1 SPRAY INTO BOTH NOSTRILS DAILY.  . fluticasone (FLOVENT HFA) 110 MCG/ACT inhaler Inhale 2 puffs into the lungs 2 (two) times daily.  . furosemide (LASIX) 40 MG tablet Take 1 tablet (40 mg) by mouth once daily as needed for weight gain, increased shortness of breath, or swelling  . glucose blood (FREESTYLE LITE) test strip Use as directed  once a daily Diag E11.65  . ipratropium-albuterol (DUONEB) 0.5-2.5 (3) MG/3ML SOLN USE 1 VIAL 3 TIMES DAILY AS NEEDED FOR SHORTNESS OF BREATH  . Lancets (FREESTYLE) lancets Use as directed once daily diag e11.65  . levothyroxine (SYNTHROID) 25 MCG tablet TAKE 1 DAILY BY MOUTH IN THE MORNING ON AN EMPTY STOMACH  . metoprolol tartrate (LOPRESSOR) 100 MG tablet Take 1 tablet (100 mg total) by mouth 2 (two) times daily.  . mometasone (ELOCON) 0.1 % lotion Apply 4 drops topically at bedtime as needed for itching.  . montelukast (SINGULAIR) 10 MG tablet TAKE 1 TABLET BY MOUTH EVERY DAY FOR COUGH  . Multiple Vitamins-Minerals (MULTIVITAMIN WITH MINERALS) tablet Take 1 tablet by mouth daily.  . OXYGEN Inhale 2 L into the lungs every evening. Sleep with nasal cannula w/ O2 at 2lpm.  . potassium chloride (KLOR-CON M10) 10 MEQ tablet TAKE 1 TABLET BY MOUTH EVERY DAY PRN ON DAYS WHEN TAKING LASIX  . Respiratory Therapy Supplies (ONE FLOW SPIROMETER) DEVI Please use device as tolerated and according to instruction provided.  . rosuvastatin (CRESTOR) 5 MG tablet Take 1 tablet (5 mg total) by mouth daily at 6 PM.  . sertraline (ZOLOFT) 100 MG tablet Take 1 tablet (100 mg total) by mouth daily.  . vitamin C (ASCORBIC ACID) 500 MG tablet Take 500 mg by mouth daily.   No facility-administered encounter medications on file as of 03/30/2019.    Surgical History: Past  Surgical History:  Procedure Laterality Date  . BREAST CYST ASPIRATION Left 20+ yrs ago  . CARDIOVERSION N/A 11/11/2018   Procedure: CARDIOVERSION;  Surgeon: Nelva Bush, MD;  Location: [facility] ORS;  Service: Cardiovascular;  Laterality: N/A;  . CATARACT EXTRACTION Bilateral [id number]  . cyst removal-right shoulder  1972  . otosclerosis Left 1988   hardening of bone, other 1989 redone  . THYROIDECTOMY  1970   nodule and 1/2 bridge removal  . WRIST SURGERY Left 11/29/2009    Medical History: Past Medical History:  Diagnosis Date  .  Arthritis   . Asthma   . Cancer ([place])    skin  . Depression   . DVT (deep venous thrombosis) ([place])   . Hyperlipidemia   . Hypertension   . Phlebitis     Family History: Family History  Problem Relation Age of Onset  . Osteoarthritis Mother   . Depression Father   . Breast cancer Neg Hx    Review of Systems  Constitutional: Negative for chills, diaphoresis and fatigue.  HENT: Negative for ear pain, postnasal drip and sinus pressure.   Eyes: Negative for photophobia, discharge, redness, itching and visual disturbance.  Respiratory: Negative for cough, shortness of breath and wheezing.   Cardiovascular: Negative for chest pain, palpitations and leg swelling.  Gastrointestinal: Negative for abdominal pain, constipation, diarrhea, nausea and vomiting.  Genitourinary: Negative for dysuria and flank pain.  Musculoskeletal: Negative for arthralgias, back pain, gait problem and neck pain.  Skin: Negative for color change.  Allergic/Immunologic: Negative for environmental allergies and food allergies.  Neurological: Negative for dizziness and headaches.  Hematological: Does not bruise/bleed easily.  Psychiatric/Behavioral: Negative for agitation, behavioral problems (depression) and hallucinations.   Vital Signs: BP (!) 153/66   Pulse 64   Resp 16   Ht 5' 2"  (1.575 m)   Wt 129 lb 9.6 oz (58.8 kg)   SpO2 99%   BMI 23.70 kg/m   Physical Exam Constitutional:      Appearance: Normal appearance.  HENT:     Head: Normocephalic and atraumatic.     Right Ear: Tympanic membrane normal.     Left Ear: Tympanic membrane normal.  Eyes:     Extraocular Movements: Extraocular movements intact.     Pupils: Pupils are equal, round, and reactive to light.  Cardiovascular:     Rate and Rhythm: Normal rate and regular rhythm.     Pulses: Normal pulses.     Heart sounds: Normal heart sounds.  Pulmonary:     Effort: Pulmonary effort is normal.     Breath sounds: Wheezing present.  Chest:      Breasts:        Right: No swelling or mass.        Left: No swelling or mass.  Abdominal:     General: Abdomen is flat.     Palpations: Abdomen is soft.  Musculoskeletal:        General: Normal range of motion.     Cervical back: No rigidity.  Skin:    General: Skin is warm and dry.  Neurological:     General: No focal deficit present.     Mental Status: She is alert and oriented to person, place, and time.    LABS: Recent Results (from the past 2160 hour(s))  POCT HgB A1C     Status: None   Collection Time: 02/03/19 10:39 AM  Result Value Ref Range   Hemoglobin A1C 5.1 4.0 - 5.6 %   HbA1c  POC (<> result, manual entry)     HbA1c, POC (prediabetic range)     HbA1c, POC (controlled diabetic range)    UA/M w/rflx Culture, Routine     Status: None   Collection Time: 03/30/19 12:00 AM   Specimen: Urine   URINE  Result Value Ref Range   Specific Gravity, UA 1.012 1.005 - 1.030   pH, UA 7.5 5.0 - 7.5   Color, UA Yellow Yellow   Appearance Ur Clear Clear   Leukocytes,UA Negative Negative   Protein,UA Negative Negative/Trace   Glucose, UA Negative Negative   Ketones, UA Negative Negative   RBC, UA Negative Negative   Bilirubin, UA Negative Negative   Urobilinogen, Ur 1.0 0.2 - 1.0 mg/dL   Nitrite, UA Negative Negative   Microscopic Examination Comment     Comment: Microscopic follows if indicated.   Microscopic Examination See below:     Comment: Microscopic was indicated and was performed.   Urinalysis Reflex Comment     Comment: This specimen will not reflex to a Urine Culture.  Microscopic Examination     Status: Abnormal   Collection Time: 03/30/19 12:00 AM   URINE  Result Value Ref Range   WBC, UA 0-5 0 - 5 /hpf   RBC None seen 0 - 2 /hpf   Epithelial Cells (non renal) None seen 0 - 10 /hpf   Casts None seen None seen /lpf   Crystals Present (A) N/A   Crystal Type Calcium Oxalate N/A   Mucus, UA Present Not Estab.   Bacteria, UA None seen None seen/Few    Assessment/Plan: 1. Encounter for general adult medical examination with abnormal findings - She is up to date on all her exams. Gets prolia inj for osteoporosis, depression is controlled   2. CHF (congestive heart failure), NYHA class II, acute on chronic, combined ([place]) - Followed up by cardiology, will continue on o2 and will need follow up  cxr - DG Chest 2 View; Future  3. Paroxysmal atrial fibrillation (HCC) - Controlled for now. She is on Eliquis   4. Mild intermittent chronic asthma without complication - Continue with all MDI as before  - Pulmonary function test; Future  5. Visit for screening mammogram - MM DIGITAL SCREENING BILATERAL; Future  6. Dysuria - UA/M w/rflx Culture, Routine - Microscopic Examination  General Counseling: Folashade verbalizes understanding of the findings of todays visit and agrees with plan of treatment. I have discussed any further diagnostic evaluation that may be needed or ordered today. We also reviewed her medications today. she has been encouraged to call the office with any questions or concerns that should arise related to todays visit.  Orders Placed This Encounter  Procedures  . Microscopic Examination  . DG Chest 2 View  . MM DIGITAL SCREENING BILATERAL  . UA/M w/rflx Culture, Routine  . Pulmonary function test   Time spent:25 [place], MD  Internal Medicine

## 2019-03-31 LAB — MICROSCOPIC EXAMINATION
Bacteria, UA: NONE SEEN
Casts: NONE SEEN /lpf
Epithelial Cells (non renal): NONE SEEN /hpf (ref 0–10)
RBC, Urine: NONE SEEN /hpf (ref 0–2)

## 2019-03-31 LAB — UA/M W/RFLX CULTURE, ROUTINE
Bilirubin, UA: NEGATIVE
Glucose, UA: NEGATIVE
Ketones, UA: NEGATIVE
Leukocytes,UA: NEGATIVE
Nitrite, UA: NEGATIVE
Protein,UA: NEGATIVE
RBC, UA: NEGATIVE
Specific Gravity, UA: 1.012 (ref 1.005–1.030)
Urobilinogen, Ur: 1 mg/dL (ref 0.2–1.0)
pH, UA: 7.5 (ref 5.0–7.5)

## 2019-04-05 DIAGNOSIS — M810 Age-related osteoporosis without current pathological fracture: Secondary | ICD-10-CM | POA: Diagnosis not present

## 2019-04-26 ENCOUNTER — Telehealth: Payer: Self-pay

## 2019-04-26 NOTE — Telephone Encounter (Signed)
Confirmed appointment with patient. klh °

## 2019-04-26 NOTE — Telephone Encounter (Signed)
Called lmom informing patient of appointment. klh 

## 2019-04-28 ENCOUNTER — Ambulatory Visit
Admission: RE | Admit: 2019-04-28 | Discharge: 2019-04-28 | Disposition: A | Discharge status: Home / Self Care | Payer: Medicare Other | Source: Ambulatory Visit | Attending: Internal Medicine | Admitting: Internal Medicine

## 2019-04-28 ENCOUNTER — Ambulatory Visit (INDEPENDENT_AMBULATORY_CARE_PROVIDER_SITE_OTHER): Payer: Medicare Other | Admitting: Internal Medicine

## 2019-04-28 ENCOUNTER — Other Ambulatory Visit: Payer: Self-pay

## 2019-04-28 ENCOUNTER — Ambulatory Visit
Admission: RE | Admit: 2019-04-28 | Discharge: 2019-04-28 | Disposition: A | Discharge status: Home / Self Care | Payer: Medicare Other | Attending: Internal Medicine | Admitting: Internal Medicine

## 2019-04-28 DIAGNOSIS — I5043 Acute on chronic combined systolic (congestive) and diastolic (congestive) heart failure: Secondary | ICD-10-CM | POA: Insufficient documentation

## 2019-04-28 DIAGNOSIS — R0602 Shortness of breath: Secondary | ICD-10-CM | POA: Diagnosis not present

## 2019-04-28 LAB — PULMONARY FUNCTION TEST

## 2019-05-05 NOTE — Procedures (Signed)
[place], [id number]  DATE OF SERVICE: April 28, 2019  Complete Pulmonary Function Testing Interpretation:  FINDINGS:  Forced vital capacity is moderately decreased.  The FEV1 is mildly decreased.  FEV1 FVC ratio is normal.  Postbronchodilator there is no significant improvement in the FEV1 clinical improvement may occur in the absence of spirometric improvement.  Total lung capacity is severely decreased residual volume is decreased residual volume total lung capacity ratio is decreased FRC is decreased.  DLCO is mildly decreased.  DLCO corrected for alveolar volume is normal  IMPRESSION:  This pulmonary function study is consistent with severe restrictive lung disease DLCO is severely decreased.  Allyne Gee, MD [facility] Medicine Sleep Medicine

## 2019-05-07 ENCOUNTER — Telehealth: Payer: Self-pay | Admitting: Cardiovascular Disease

## 2019-05-07 MED ORDER — APIXABAN 2.5 MG PO TABS: 2.5000 mg | ORAL_TABLET | Freq: Two times a day (BID) | ORAL | 2 refills | Status: DC

## 2019-05-07 NOTE — Telephone Encounter (Signed)
Eliquis 2.5mg  refill request received, pt is 86 yrs old, weight-58.8kg, Crea-0.55 on 12/10/18 and 0.60 on 04/05/2019 per Care Everywhere via [place], Diagnosis-Afib, and last seen by Dr. Fletcher Anon on 02/09/2019. Dose is appropriate based on dosing criteria. Will send in refill to requested pharmacy as a 90 day supply.   Pt is living out of town with her daughter and she is aware that the med will be sent in.

## 2019-05-07 NOTE — Telephone Encounter (Signed)
Patient daughter calling  Wants to know if Eliquis will do a 90 day supply instead of a 30 day  Please call to discuss

## 2019-05-11 IMAGING — RF DG SWALLOWING FUNCTION
13 of 19 series · 13 of 24 positions shown · non-contrast
Comparison: None.

CLINICAL DATA: Dysphagia

EXAM:
MODIFIED BARIUM SWALLOW
TECHNIQUE: Different consistencies of barium were administered orally to the
patient by the Speech Pathologist. Imaging of the pharynx was
performed in the lateral projection.
FLUOROSCOPY TIME:  Fluoroscopy Time: 1 minutes 30 seconds
Number of Acquired Spot Images: 1

[Series 1: run · 1 of 25 frames shown (1 of 13)]
[frame 4/25]
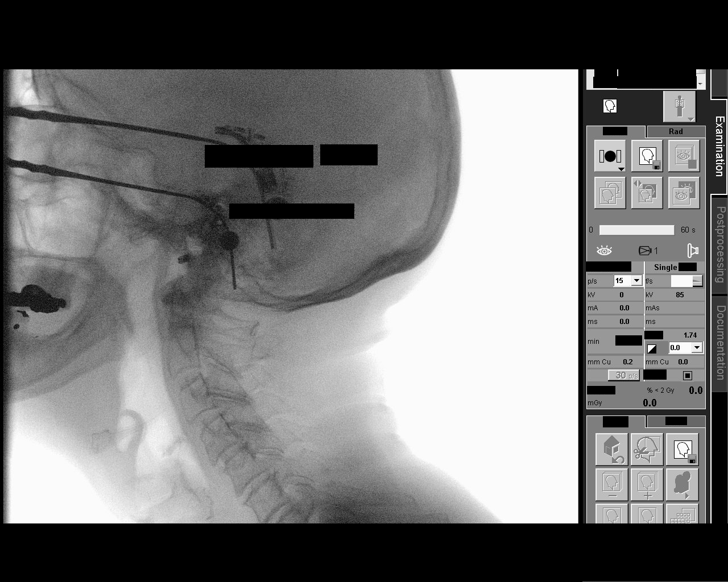

[Series 2: run · 1 of 51 frames shown (2 of 13)]
[frame 44/51]
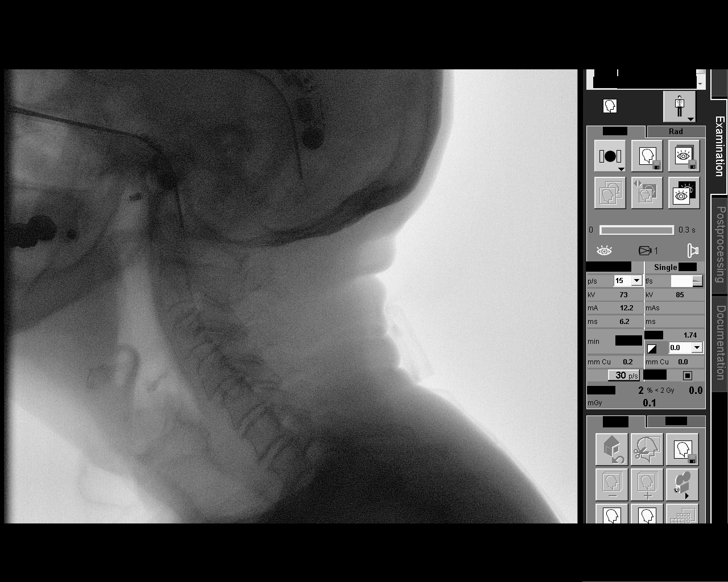

[Series 4: run · 1 of 135 frames shown (3 of 13)]
[frame 48/135]
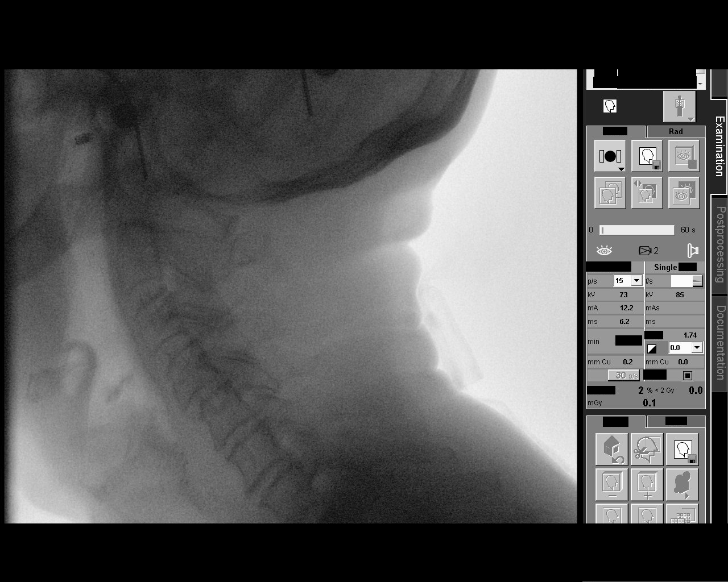

[Series 6: run · 1 of 25 frames shown (4 of 13)]
[frame 4/25]
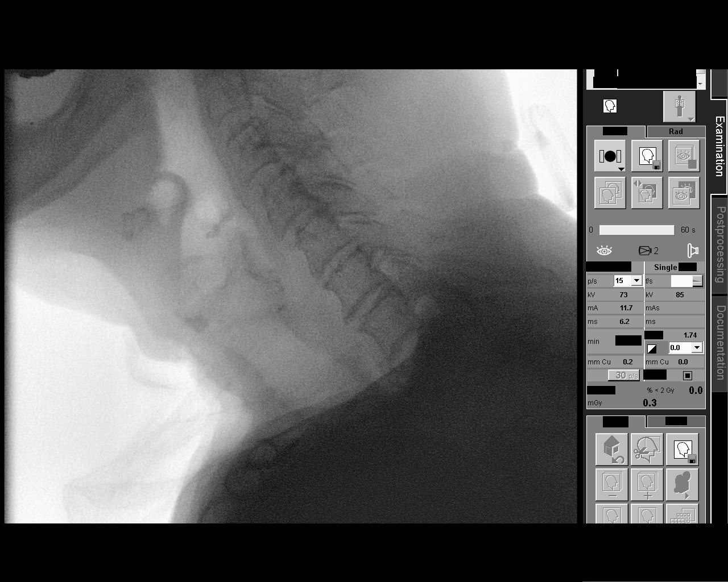

[Series 7: run · 1 of 371 frames shown (5 of 13)]
[frame 186/371]
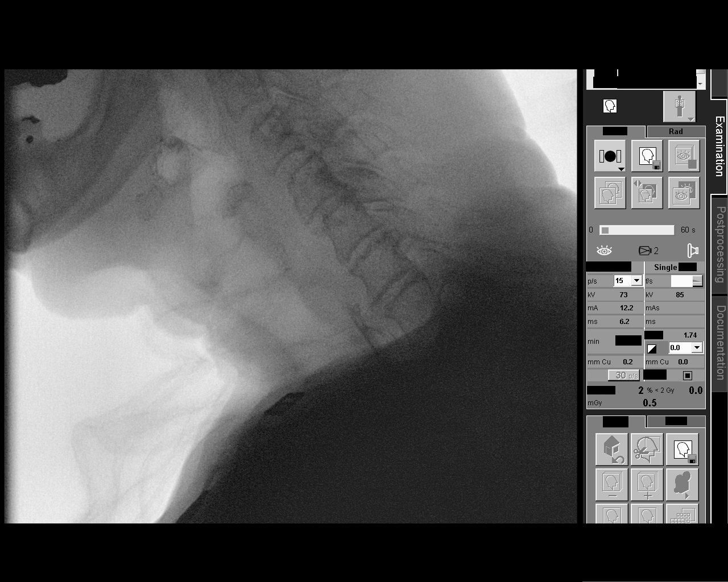

[Series 9: run · 1 of 160 frames shown (6 of 13)]
[frame 81/160]
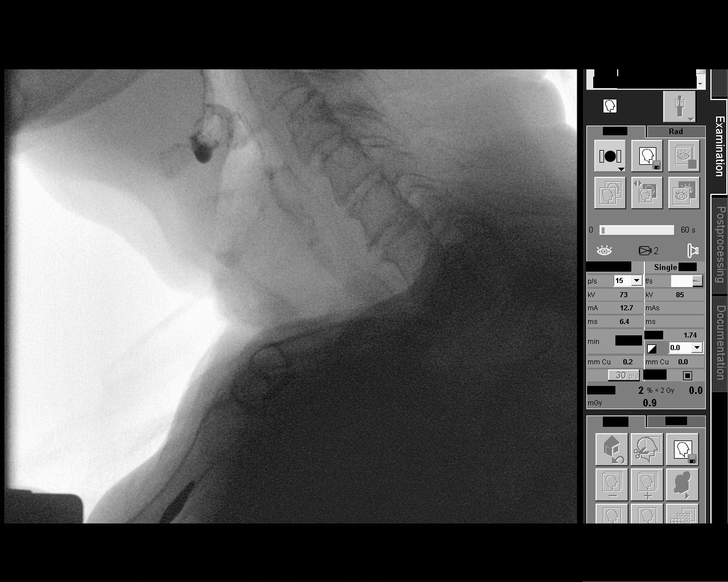

[Series 10: run · 1 of 286 frames shown (7 of 13)]
[frame 244/286]
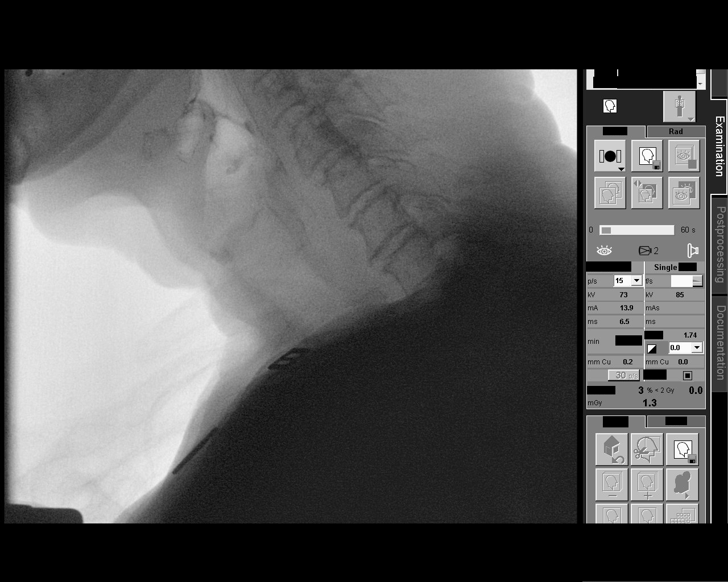

[Series 11: run · 1 of 480 frames shown (8 of 13)]
[frame 301/480]
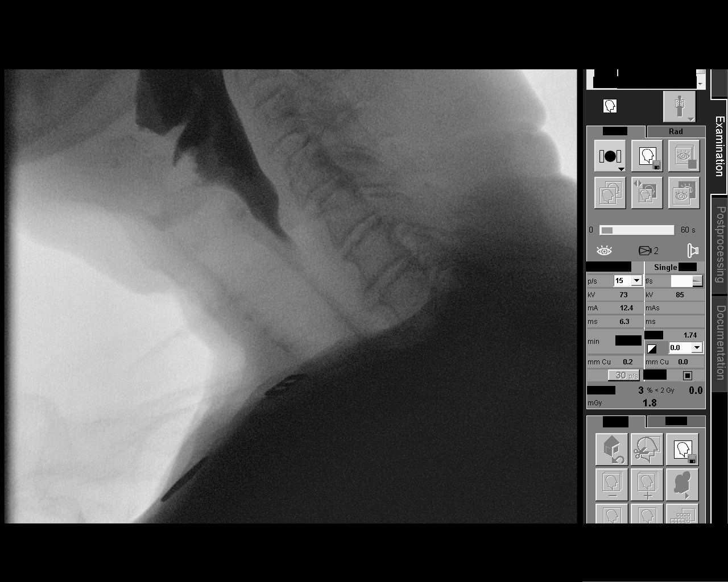

[Series 13: run · 1 of 128 frames shown (9 of 13)]
[frame 65/128]
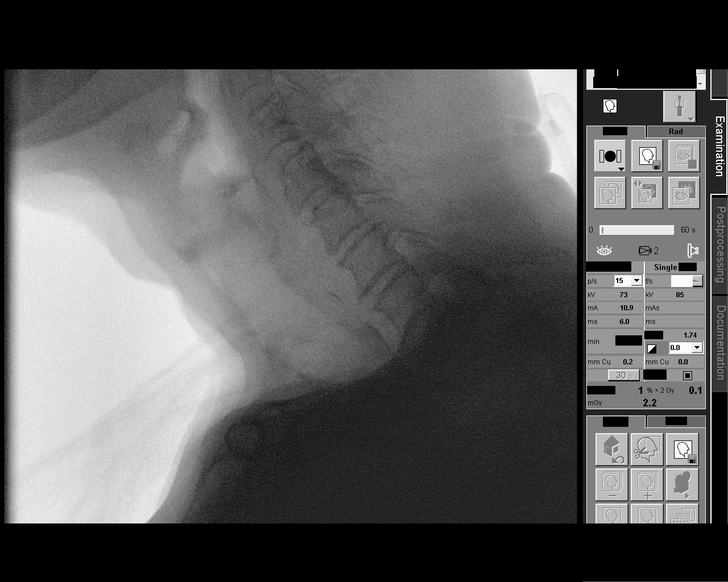

[Series 14: run · 1 of 144 frames shown (10 of 13)]
[frame 142/144]
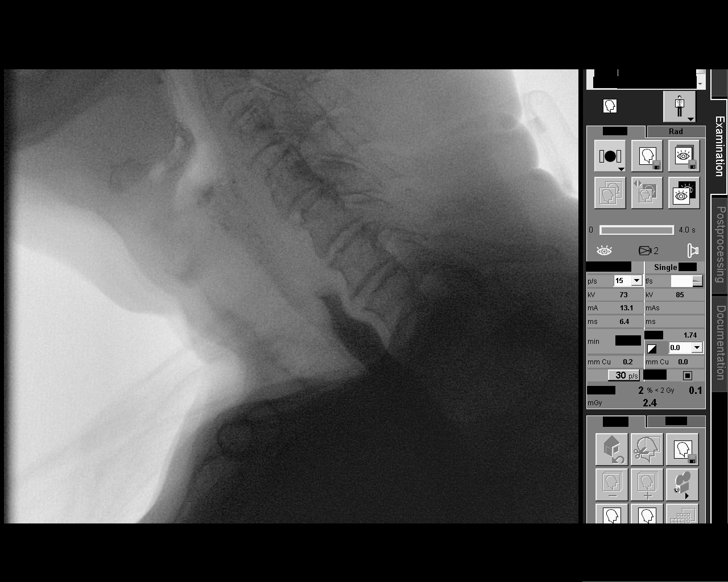

[Series 16: run · 1 of 307 frames shown (11 of 13)]
[frame 154/307]
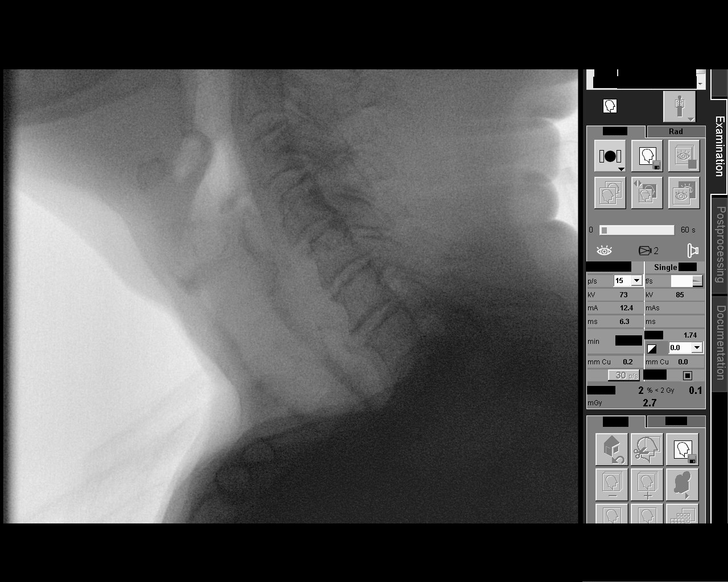

[Series 18: run · 1 of 23 frames shown (12 of 13)]
[frame 4/23]
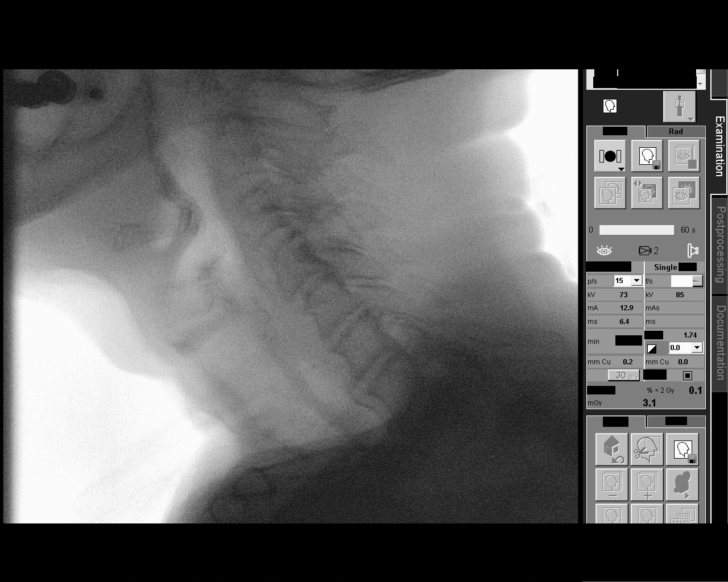

[Series 19: run · 1 of 39 frames shown (13 of 13)]
[frame 38/39]
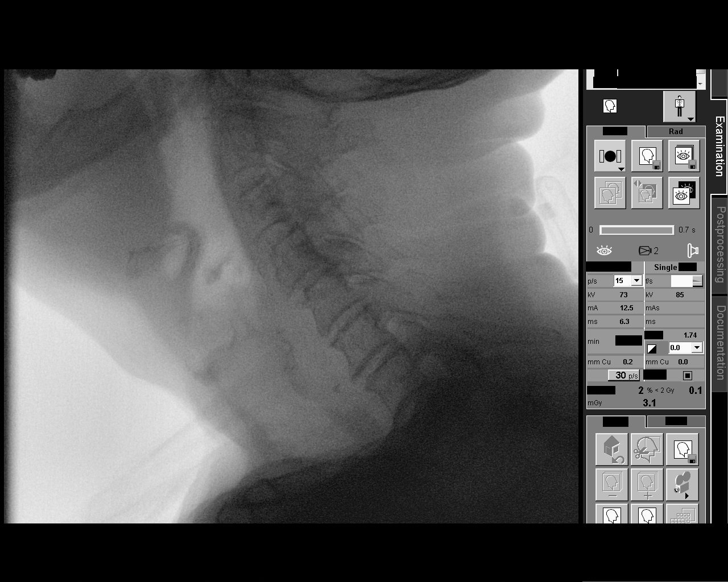

[13 of 24 positions shown; findings below may reference images not displayed]

FINDINGS: Thin liquid-There was transient laryngeal penetration without
aspiration. No pharyngeal pooling.

Nectar thick liquid- There was transient laryngeal penetration
without laceration. No pharyngeal pooling.

Honey: Not studied

Pure-no penetration or aspiration.  No pooling.

Pure with cracker- within normal limits

Barium tablet-not studied
IMPRESSION: Transient laryngeal penetration without laceration with thin liquid
and nectar consistencies. Study otherwise unremarkable.

Please refer to the Speech Pathologists report for complete details
and recommendations.

## 2019-05-13 ENCOUNTER — Ambulatory Visit (INDEPENDENT_AMBULATORY_CARE_PROVIDER_SITE_OTHER): Payer: Medicare Other | Admitting: Cardiovascular Disease

## 2019-05-13 ENCOUNTER — Other Ambulatory Visit: Payer: Self-pay

## 2019-05-13 ENCOUNTER — Encounter: Payer: Self-pay | Admitting: Cardiovascular Disease

## 2019-05-13 VITALS — BP 110/62 | HR 62 | Wt 123.1 lb

## 2019-05-13 DIAGNOSIS — I5032 Chronic diastolic (congestive) heart failure: Secondary | ICD-10-CM | POA: Diagnosis not present

## 2019-05-13 DIAGNOSIS — I4819 Other persistent atrial fibrillation: Secondary | ICD-10-CM

## 2019-05-13 DIAGNOSIS — I10 Essential (primary) hypertension: Secondary | ICD-10-CM

## 2019-05-13 NOTE — Progress Notes (Signed)
Cardiology Office Note   Date:  05/13/2019   ID:  Ashley Weiss, DOB 10-09-1919, MRN [id number]  PCP:  Lavera Guise, MD  Cardiologist:   Kathlyn Sacramento, MD   Chief Complaint  Patient presents with  . Other    3 month follow up. Patient denies chest pain and SOB. Meds reviewed verbally with patient.       History of Present Illness: Ashley Weiss is a 86 y.o. female who is here today for follow-up visit regarding persistent atrial fibrillation maintaining in sinus rhythm with amiodarone and chronic diastolic heart failure.  She has history of hyperlipidemia, essential hypertension, hyperlipidemia and persistent atrial fibrillation.  She  Rate control was difficult on her and ultimately she underwent successful cardioversion in July after starting amiodarone. Echocardiogram in July showed an EF of 55 to 60% with moderately dilated left atrium, moderate pulmonary hypertension and moderate tricuspid regurgitation. Since then, she has required oxygen therapy.  She has been doing well from a cardiac standpoint with no chest pain or worsening dyspnea.  She continues to be on 2 L of nasal cannula.  She has not required furosemide and her weight has been stable. She does complain of some nighttime palpitations but every time she checked her pulse it was in the 60s.  She did have a recent chest x-ray done which showed diffuse increased pulmonary interstitial opacities noted since August.  She had pulmonary function testing done which showed severe restrictive lung disease with severely decreased DLCO.  Past Medical History:  Diagnosis Date  . Arthritis   . Asthma   . Cancer ([facility])    skin  . Depression   . DVT (deep venous thrombosis) ([place])   . Hyperlipidemia   . Hypertension   . Phlebitis     Past Surgical History:  Procedure Laterality Date  . BREAST CYST ASPIRATION Left 20+ yrs ago  . CARDIOVERSION N/A 11/11/2018   Procedure: CARDIOVERSION;  Surgeon: Nelva Bush, MD;   Location: [facility] ORS;  Service: Cardiovascular;  Laterality: N/A;  . CATARACT EXTRACTION Bilateral [id number]  . cyst removal-right shoulder  1972  . otosclerosis Left 1988   hardening of bone, other 1989 redone  . THYROIDECTOMY  1970   nodule and 1/2 bridge removal  . WRIST SURGERY Left 11/29/2009     Current Outpatient Medications  Medication Sig Dispense Refill  . albuterol (PROVENTIL HFA;VENTOLIN HFA) 108 (90 Base) MCG/ACT inhaler Inhale 2 puffs into the lungs every 6 (six) hours as needed for wheezing or shortness of breath. 1 Inhaler 5  . amiodarone (PACERONE) 200 MG tablet Take 1 tablet (200 mg total) by mouth daily. 30 tablet 0  . apixaban (ELIQUIS) 2.5 MG TABS tablet Take 1 tablet (2.5 mg total) by mouth 2 (two) times daily. 180 tablet 2  . Blood Glucose Monitoring Suppl (FREESTYLE FREEDOM LITE) w/Device KIT 1 kit by Does not apply route daily. 1 each 0  . denosumab (PROLIA) 60 MG/ML SOSY injection Inject 60 mg into the skin every 6 (six) months.    . diltiazem (CARDIZEM CD) 120 MG 24 hr capsule TAKE 1 CAPSULE BY MOUTH EVERY DAY 30 capsule 5  . Fluocinolone Acetonide 0.01 % OIL Place 4 drops in ear(s) at bedtime as needed.     . fluticasone (FLONASE) 50 MCG/ACT nasal spray PLACE 1 SPRAY INTO BOTH NOSTRILS DAILY. 48 mL 2  . fluticasone (FLOVENT HFA) 110 MCG/ACT inhaler Inhale 2 puffs into the lungs 2 (two) times daily. 3  Inhaler 3  . furosemide (LASIX) 40 MG tablet Take 1 tablet (40 mg) by mouth once daily as needed for weight gain, increased shortness of breath, or swelling    . glucose blood (FREESTYLE LITE) test strip Use as directed once a daily Diag E11.65 100 each 12  . ipratropium-albuterol (DUONEB) 0.5-2.5 (3) MG/3ML SOLN USE 1 VIAL 3 TIMES DAILY AS NEEDED FOR SHORTNESS OF BREATH 810 mL 0  . Lancets (FREESTYLE) lancets Use as directed once daily diag e11.65 100 each 12  . levothyroxine (SYNTHROID) 25 MCG tablet TAKE 1 DAILY BY MOUTH IN THE MORNING ON AN EMPTY STOMACH 90 tablet  1  . metoprolol tartrate (LOPRESSOR) 100 MG tablet Take 1 tablet (100 mg total) by mouth 2 (two) times daily.    . montelukast (SINGULAIR) 10 MG tablet TAKE 1 TABLET BY MOUTH EVERY DAY FOR COUGH 90 tablet 1  . Multiple Vitamins-Minerals (MULTIVITAMIN WITH MINERALS) tablet Take 1 tablet by mouth daily.    . OXYGEN Inhale 2 L into the lungs every evening. Sleep with nasal cannula w/ O2 at 2lpm.    . potassium chloride (KLOR-CON M10) 10 MEQ tablet TAKE 1 TABLET BY MOUTH EVERY DAY PRN ON DAYS WHEN TAKING LASIX 90 tablet 0  . Respiratory Therapy Supplies (ONE FLOW SPIROMETER) DEVI Please use device as tolerated and according to instruction provided. 1 Device 0  . rosuvastatin (CRESTOR) 5 MG tablet Take 1 tablet (5 mg total) by mouth daily at 6 PM. 90 tablet 1  . sertraline (ZOLOFT) 100 MG tablet Take 1 tablet (100 mg total) by mouth daily. 90 tablet 2  . vitamin C (ASCORBIC ACID) 500 MG tablet Take 500 mg by mouth daily.     No current facility-administered medications for this visit.    Allergies:   Hydralazine, Biaxin [clarithromycin], Ciprofloxacin, Meloxicam, and Minocycline    Social History:  The patient  reports that she has never smoked. She has never used smokeless tobacco. She reports that she does not drink alcohol or use drugs.   Family History:  The patient's family history includes Depression in her father; Osteoarthritis in her mother.    ROS:  Please see the history of present illness.   Otherwise, review of systems are positive for none.   All other systems are reviewed and negative.    PHYSICAL EXAM: VS:  BP 110/62 (BP Location: Right Arm, Patient Position: Sitting, Cuff Size: Normal)   Pulse 62   Wt 123 lb 2 oz (55.8 kg)   SpO2 99%   BMI 22.52 kg/m  , BMI Body mass index is 22.52 kg/m. GEN: Well nourished, well developed, in no acute distress  HEENT: normal  Neck: no JVD, carotid bruits, or masses Cardiac: Regular rate and rhythm; no murmurs, rubs, or gallops,no  edema  Respiratory:  clear to auscultation bilaterally, normal work of breathing GI: soft, nontender, nondistended, + BS MS: no deformity or atrophy  Skin: warm and dry, no rash Neuro:  Strength and sensation are intact Psych: euthymic mood, full affect   EKG:  EKG is ordered today. The ekg ordered today demonstrates normal sinus rhythm with minimal LVH.   Recent Labs: 11/10/2018: Magnesium 2.1 11/11/2018: B Natriuretic Peptide 283.0; TSH 4.231 11/26/2018: ALT 25; Hemoglobin 13.0; Platelets 255 12/10/2018: BUN 16; Creatinine, Ser 0.55; Potassium 4.3; Sodium 140    Lipid Panel    Component Value Date/Time   CHOL 79 11/11/2018 0317   CHOL 119 10/09/2018 0848   TRIG 34 11/11/2018 0317  HDL 30 (L) 11/11/2018 0317   HDL 38 (L) 10/09/2018 0848   CHOLHDL 2.6 11/11/2018 0317   VLDL 7 11/11/2018 0317   LDLCALC 42 11/11/2018 0317   LDLCALC 57 10/09/2018 0848      Wt Readings from Last 3 Encounters:  05/13/19 123 lb 2 oz (55.8 kg)  03/30/19 129 lb 9.6 oz (58.8 kg)  03/11/19 128 lb (58.1 kg)      PAD Screen 10/29/2018  Previous PAD dx? No  Previous surgical procedure? No  Pain with walking? No  Feet/toe relief with dangling? No  Painful, non-healing ulcers? No  Extremities discolored? No      ASSESSMENT AND PLAN:  1.  Persistent atrial fibrillation/flutter: She is maintaining in sinus rhythm with amiodarone.  However, her pulmonary function testing showed evidence of severe restrictive lung disease with severely reduced DLCO.  In addition, chest x-ray showed evidence of interstitial lung disease.  The patient did not have this in the past and this is very concerning for amiodarone induced lung toxicity.  Due to that, I elected to discontinue amiodarone.  Continue current dose of metoprolol and diltiazem.   Continue anticoagulation with low-dose Eliquis. If she develops recurrent atrial fibrillation, a different antiarrhythmic medication will have to be considered.  Atrial  fibrillation in the past was associated with significant difficulty in rate control and was complicated by heart failure.  I discussed the case with Dr. Humphrey Rolls to see if there is any benefit from starting steroids for suspected amiodarone induced lung toxicity.  He does not recommend that.  2.  Essential hypertension: Blood pressure is controlled  3.  Chronic diastolic heart failure she appears to be euvolemic.  This was only in the setting of atrial fibrillation.  She has not required furosemide lately.    Disposition:   FU with me in 3 month.  Signed,  Kathlyn Sacramento, MD  05/13/2019 11:15 AM    [place]

## 2019-05-13 NOTE — Patient Instructions (Signed)
Medication Instructions:  Your physician has recommended you make the following change in your medication:   STOP Amiodarone  *If you need a refill on your cardiac medications before your next appointment, please call your pharmacy*  Lab Work: None ordered If you have labs (blood work) drawn today and your tests are completely normal, you will receive your results only by: Marland Kitchen MyChart Message (if you have MyChart) OR . A paper copy in the mail If you have any lab test that is abnormal or we need to change your treatment, we will call you to review the results.  Testing/Procedures: None ordered  Follow-Up: At [facility], you and your health needs are our priority.  As part of our continuing mission to provide you with exceptional heart care, we have created designated Provider Care Teams.  These Care Teams include your primary Cardiologist (physician) and Advanced Practice Providers (APPs -  Physician Assistants and Nurse Practitioners) who all work together to provide you with the care you need, when you need it.  Your next appointment:   3 month(s)  The format for your next appointment:   In Person  Provider:    You may see Kathlyn Sacramento, MD or one of the following Advanced Practice Providers on your designated Care Team:    Murray Hodgkins, NP  Christell Faith, PA-C  Marrianne Mood, PA-C   Other Instructions N/A

## 2019-06-01 ENCOUNTER — Encounter: Payer: Self-pay | Admitting: Internal Medicine

## 2019-06-01 ENCOUNTER — Ambulatory Visit (INDEPENDENT_AMBULATORY_CARE_PROVIDER_SITE_OTHER): Payer: Medicare Other | Admitting: Internal Medicine

## 2019-06-01 VITALS — BP 145/66 | HR 66 | Temp 98.5°F | Ht 62.0 in | Wt 122.8 lb

## 2019-06-01 DIAGNOSIS — J4520 Mild intermittent asthma, uncomplicated: Secondary | ICD-10-CM | POA: Diagnosis not present

## 2019-06-01 DIAGNOSIS — I4891 Unspecified atrial fibrillation: Secondary | ICD-10-CM | POA: Diagnosis not present

## 2019-06-01 DIAGNOSIS — I5043 Acute on chronic combined systolic (congestive) and diastolic (congestive) heart failure: Secondary | ICD-10-CM | POA: Diagnosis not present

## 2019-06-01 NOTE — Patient Instructions (Signed)
Home Oxygen Use, Adult When a medical condition keeps you from getting enough oxygen, your health care provider may instruct you to take extra oxygen at home. Your health care provider will let you know:  When to take oxygen.  For how long to take oxygen.  How quickly oxygen should be delivered (flow rate), in liters per minute (LPM or L/M). Home oxygen can be given through:  A mask.  A nasal cannula. This is a device or tube that goes in the nostrils.  A transtracheal catheter. This is a small, flexible tube placed in the trachea.  A tracheostomy. This is a surgically made opening in the trachea. These devices are connected with tubing to an oxygen source, such as:  A tank. Tanks hold oxygen in gas form. They must be replaced when the oxygen is used up.  A liquid oxygen device. This holds oxygen in liquid form. It must be replaced when the oxygen is used up.  An oxygen concentrator machine. This filters oxygen in the room. It uses electricity, so you must have a backup cylinder of oxygen in case the power goes out. Supplies needed: To use oxygen, you will need:  A mask, nasal cannula, transtracheal catheter, or tracheostomy.  An oxygen tank, a liquid oxygen device, or an oxygen concentrator.  The tape that your health care provider recommends (optional). If you use a transtracheal catheter and your prescribed flow rate is 1 LPM or greater, you will also need a humidifier. Risks and complications  Fire. This can happen if the oxygen is exposed to a heat source, flame, or spark.  Injury to skin. This can happen if liquid oxygen touches your skin.  Organ damage. This can happen if you get too little oxygen. How to use oxygen Your health care provider or a representative from your medical device company will show you how to use your oxygen device. Follow her or his instructions. The instructions may look something like this: 1. Wash your hands. 2. If you use an oxygen  concentrator, make sure it is plugged in. 3. Place one end of the tube into the port on the tank, device, or machine. 4. Place the mask over your nose and mouth. Or, place the nasal cannula and secure it with tape if instructed. If you use a tracheostomy or transtracheal catheter, connect it to the oxygen source as directed. 5. Make sure the liter-flow setting on the machine is at the level prescribed by your health care provider. 6. Turn on the machine or adjust the knob on the tank or device to the correct liter-flow setting. 7. When you are done, turn off and unplug the machine, or turn the knob to OFF. How to clean and care for the oxygen supplies Nasal cannula  Clean it with a warm, wet cloth daily or as needed.  Wash it with a liquid soap once a week.  Rinse it thoroughly once or twice a week.  Replace it every 2-4 weeks.  If you have an infection, such as a cold or pneumonia, change the cannula when you get better. Mask  Replace it every 2-4 weeks.  If you have an infection, such as a cold or pneumonia, change the mask when you get better. Humidifier bottle  Wash the bottle between each refill: ? Wash it with soap and warm water. ? Rinse it thoroughly. ? Disinfect it and its top. ? Air-dry it.  Make sure it is dry before you refill it. Oxygen concentrator  Clean   the air filter at least twice a week according to directions from your home medical equipment and service company.  Wipe down the cabinet every day. To do this: ? Unplug the unit. ? Wipe down the cabinet with a damp cloth. ? Dry the cabinet. Other equipment  Change any extra tubing every 1-3 months.  Follow instructions from your health care provider about taking care of any other equipment. Safety tips Fire safety tips   Keep your oxygen and oxygen supplies at least 5 ft away from sources of heat, flames, and sparks at all times.  Do not allow smoking near your oxygen. Put up "no smoking" signs in  your home. Avoid smoking areas when in public.  Do not use materials that can burn (are flammable) while you use oxygen.  When you go to a restaurant with portable oxygen, ask to be seated in the nonsmoking section.  Keep a fire extinguisher close by. Let your fire department know that you have oxygen in your home.  Test your home smoke detectors regularly. Traveling  Secure your oxygen tank in the vehicle so that it does not move around. Follow instructions from your medical device company about how to safely secure your tank.  Make sure you have enough oxygen for the amount of time you will be away from home.  If you are planning air travel, contact the airline to find out if they allow the use of an approved portable oxygen concentrator. You may also need documents from your health care provider and medical device company before you travel. General safety tips  If you use an oxygen cylinder, make sure it is in a stand or secured to an object that will not move (fixed object).  If you use liquid oxygen, make sure its container is kept upright.  If you use an oxygen concentrator: ? Tell your electric company. Make sure you are given priority service in the event that your power goes out. ? Avoid using extension cords, if possible. Follow these instructions at home:  Use oxygen only as told by your health care provider.  Do not use alcohol or other drugs that make you relax (sedating drugs) unless instructed. They can slow down your breathing rate and make it hard to get in enough oxygen.  Know how and when to order a refill of oxygen.  Always keep a spare tank of oxygen. Plan ahead for holidays when you may not be able to get a prescription filled.  Use water-based lubricants on your lips or nostrils. Do not use oil-based products like petroleum jelly.  To prevent skin irritation on your cheeks or behind your ears, tuck some gauze under the tubing. Contact a health care  provider if:  You get headaches often.  You have shortness of breath.  You have a lasting cough.  You have anxiety.  You are sleepy all the time.  You develop an illness that affects your breathing.  You cannot exercise at your regular level.  You are restless.  You have difficult or irregular breathing, and it is getting worse.  You have a fever.  You have persistent redness under your nose. Get help right away if:  You are confused.  You have blue lips or fingernails.  You are struggling to breathe. Summary  Your health care provider or a representative from your medical device company will show you how to use your oxygen device. Follow her or his instructions.  If you use an oxygen concentrator, make   sure it is plugged in.  Make sure the liter-flow setting on the machine is at the level prescribed by your health care provider.  Keep your oxygen and oxygen supplies at least 5 ft away from sources of heat, flames, and sparks at all times. This information is not intended to replace advice given to you by your health care provider. Make sure you discuss any questions you have with your health care provider. Document Revised: 09/25/2017 Document Reviewed: 10/31/2015 Elsevier Patient Education  2020 [employer]  

## 2019-06-01 NOTE — Progress Notes (Signed)
[facility] [place], [place] [address]  Internal MEDICINE  Telephone Visit  Patient Name: Ashley Weiss  [id number]  [id number]  Date of Service: 06/01/2019  I connected with the patient at 223 by telephone and video and verified the patients identity using two identifiers.   I discussed the limitations, risks, security and privacy concerns of performing an evaluation and management service by telephone and the availability of in person appointments. I also discussed with the patient that there may be a patient responsible charge related to the service.  The patient expressed understanding and agrees to proceed.    Chief Complaint  Patient presents with  . Telephone Assessment  . Telephone Screen  . Asthma    HPI Patient being seen for multiple medical issues including hypoxia the patient has had more than likely damage to the lungs from amiodarone and she was asked to discontinue the amiodarone.  She is on oxygen therapy.  The daughters report that she uses about 2 L of oxygen.  The patient does have desaturations when she is not using oxygen and she exerts herself.  Right now is comfortable did not appear to be in any distress.  Both daughters were with her during the encounter.  No admissions to the hospital are noted.  I did review Dr. Jacklynn Ganong note from her cardiology visit.  Also reviewed the patient's pulmonary functions from January 6 which showed her severe reduction of her DLCO and mild reduction of her FEV1    Current Medication: Outpatient Encounter Medications as of 06/01/2019  Medication Sig  . albuterol (PROVENTIL HFA;VENTOLIN HFA) 108 (90 Base) MCG/ACT inhaler Inhale 2 puffs into the lungs every 6 (six) hours as needed for wheezing or shortness of breath.  Marland Kitchen apixaban (ELIQUIS) 2.5 MG TABS tablet Take 1 tablet (2.5 mg total) by mouth 2 (two) times daily.  . Blood Glucose Monitoring Suppl (FREESTYLE FREEDOM LITE) w/Device KIT 1 kit by Does not apply  route daily.  Marland Kitchen denosumab (PROLIA) 60 MG/ML SOSY injection Inject 60 mg into the skin every 6 (six) months.  . diltiazem (CARDIZEM CD) 120 MG 24 hr capsule TAKE 1 CAPSULE BY MOUTH EVERY DAY  . Fluocinolone Acetonide 0.01 % OIL Place 4 drops in ear(s) at bedtime as needed.   . fluticasone (FLONASE) 50 MCG/ACT nasal spray PLACE 1 SPRAY INTO BOTH NOSTRILS DAILY.  . fluticasone (FLOVENT HFA) 110 MCG/ACT inhaler Inhale 2 puffs into the lungs 2 (two) times daily.  . furosemide (LASIX) 40 MG tablet Take 1 tablet (40 mg) by mouth once daily as needed for weight gain, increased shortness of breath, or swelling  . glucose blood (FREESTYLE LITE) test strip Use as directed once a daily Diag E11.65  . ipratropium-albuterol (DUONEB) 0.5-2.5 (3) MG/3ML SOLN USE 1 VIAL 3 TIMES DAILY AS NEEDED FOR SHORTNESS OF BREATH  . Lancets (FREESTYLE) lancets Use as directed once daily diag e11.65  . levothyroxine (SYNTHROID) 25 MCG tablet TAKE 1 DAILY BY MOUTH IN THE MORNING ON AN EMPTY STOMACH  . metoprolol tartrate (LOPRESSOR) 100 MG tablet Take 1 tablet (100 mg total) by mouth 2 (two) times daily.  . montelukast (SINGULAIR) 10 MG tablet TAKE 1 TABLET BY MOUTH EVERY DAY FOR COUGH  . Multiple Vitamins-Minerals (MULTIVITAMIN WITH MINERALS) tablet Take 1 tablet by mouth daily.  . OXYGEN Inhale 2 L into the lungs every evening. Sleep with nasal cannula w/ O2 at 2lpm.  . potassium chloride (KLOR-CON M10) 10 MEQ tablet TAKE 1  TABLET BY MOUTH EVERY DAY PRN ON DAYS WHEN TAKING LASIX  . Respiratory Therapy Supplies (ONE FLOW SPIROMETER) DEVI Please use device as tolerated and according to instruction provided.  . rosuvastatin (CRESTOR) 5 MG tablet Take 1 tablet (5 mg total) by mouth daily at 6 PM.  . sertraline (ZOLOFT) 100 MG tablet Take 1 tablet (100 mg total) by mouth daily.  . vitamin C (ASCORBIC ACID) 500 MG tablet Take 500 mg by mouth daily.   No facility-administered encounter medications on file as of 06/01/2019.     Surgical History: Past Surgical History:  Procedure Laterality Date  . BREAST CYST ASPIRATION Left 20+ yrs ago  . CARDIOVERSION N/A 11/11/2018   Procedure: CARDIOVERSION;  Surgeon: Nelva Bush, MD;  Location: [facility] ORS;  Service: Cardiovascular;  Laterality: N/A;  . CATARACT EXTRACTION Bilateral [id number]  . cyst removal-right shoulder  1972  . otosclerosis Left 1988   hardening of bone, other 1989 redone  . THYROIDECTOMY  1970   nodule and 1/2 bridge removal  . WRIST SURGERY Left 11/29/2009    Medical History: Past Medical History:  Diagnosis Date  . Arthritis   . Asthma   . Cancer ([place])    skin  . Depression   . DVT (deep venous thrombosis) ([place])   . Hyperlipidemia   . Hypertension   . Phlebitis     Family History: Family History  Problem Relation Age of Onset  . Osteoarthritis Mother   . Depression Father   . Breast cancer Neg Hx     Social History   Socioeconomic History  . Marital status: Widowed    Spouse name: Not on file  . Number of children: Not on file  . Years of education: Not on file  . Highest education level: Not on file  Occupational History  . Not on file  Tobacco Use  . Smoking status: Never Smoker  . Smokeless tobacco: Never Used  Substance and Sexual Activity  . Alcohol use: No  . Drug use: No  . Sexual activity: Never  Other Topics Concern  . Not on file  Social History Narrative  . Not on file   Social Determinants of Health   Financial Resource Strain:   . Difficulty of Paying Living Expenses: Not on file  Food Insecurity:   . Worried About Charity fundraiser in the Last Year: Not on file  . Ran Out of Food in the Last Year: Not on file  Transportation Needs:   . Lack of Transportation (Medical): Not on file  . Lack of Transportation (Non-Medical): Not on file  Physical Activity:   . Days of Exercise per Week: Not on file  . Minutes of Exercise per Session: Not on file  Stress:   . Feeling of Stress : Not on  file  Social Connections:   . Frequency of Communication with Friends and Family: Not on file  . Frequency of Social Gatherings with Friends and Family: Not on file  . Attends Religious Services: Not on file  . Active Member of Clubs or Organizations: Not on file  . Attends Archivist Meetings: Not on file  . Marital Status: Not on file  Intimate Partner Violence:   . Fear of Current or Ex-Partner: Not on file  . Emotionally Abused: Not on file  . Physically Abused: Not on file  . Sexually Abused: Not on file      Review of Systems  Positive shortness of breath on exertion Denies chest  pain Denies edema Denies headaches Denies nausea vomiting or diarrhea  Vital Signs: BP (!) 145/66   Pulse 66   Temp 98.5 F (36.9 C)   Ht 5' 2" (1.575 m)   Wt 122 lb 12.8 oz (55.7 kg)   SpO2 99% Comment: 2 litre  BMI 22.46 kg/m    Observation/Objective: Did not appear to be any distress she was using her oxygen as prescribed    Assessment/Plan: #1 atrial fibrillation rapid ventricular response patient now is under control patient had been on amiodarone which had to be stopped as per discussion with cardiology in agreement and their recommendations to discontinue because of the drop in the DLCO.  Her heart rate appears to be controlled and she is not having any issues so far so we will continue to monitor.  2.  Oxygen dependence she is currently using about 2 L instructed the family on how to properly manage her oxygen usage.  3.  Chronic obstructive asthma this is under control using inhalers as necessary we will continue to monitor closely.  General Counseling: Judythe verbalizes understanding of the findings of today's phone visit and agrees with plan of treatment. I have discussed any further diagnostic evaluation that may be needed or ordered today. We also reviewed her medications today. she has been encouraged to call the office with any questions or concerns that should  arise related to todays visit.    No orders of the defined types were placed in this encounter.   No orders of the defined types were placed in this encounter.   Time spent: 15 minutes    Allyne Gee MD [facility] Pulmonary Medicine

## 2019-06-07 ENCOUNTER — Other Ambulatory Visit: Payer: Self-pay

## 2019-06-07 MED ORDER — SERTRALINE HCL 100 MG PO TABS: 100.0000 mg | ORAL_TABLET | Freq: Every day | ORAL | 2 refills | Status: DC

## 2019-06-08 ENCOUNTER — Ambulatory Visit: Payer: Medicare Other | Admitting: Internal Medicine

## 2019-06-09 ENCOUNTER — Ambulatory Visit: Payer: Medicare Other | Admitting: Adult Health

## 2019-06-16 ENCOUNTER — Other Ambulatory Visit: Payer: Self-pay | Admitting: Cardiovascular Disease

## 2019-06-25 ENCOUNTER — Telehealth: Payer: Self-pay

## 2019-06-25 DIAGNOSIS — H6062 Unspecified chronic otitis externa, left ear: Secondary | ICD-10-CM | POA: Diagnosis not present

## 2019-06-25 DIAGNOSIS — H903 Sensorineural hearing loss, bilateral: Secondary | ICD-10-CM | POA: Diagnosis not present

## 2019-06-25 DIAGNOSIS — H6123 Impacted cerumen, bilateral: Secondary | ICD-10-CM | POA: Diagnosis not present

## 2019-06-25 NOTE — Telephone Encounter (Signed)
CONFIRMED AND SCREENED FOR 06-29-19 OV. 

## 2019-06-29 ENCOUNTER — Ambulatory Visit: Payer: Medicare Other | Admitting: Internal Medicine

## 2019-06-30 ENCOUNTER — Encounter: Payer: Self-pay | Admitting: Internal Medicine

## 2019-06-30 ENCOUNTER — Ambulatory Visit (INDEPENDENT_AMBULATORY_CARE_PROVIDER_SITE_OTHER): Payer: Medicare Other | Admitting: Internal Medicine

## 2019-06-30 DIAGNOSIS — E119 Type 2 diabetes mellitus without complications: Secondary | ICD-10-CM

## 2019-06-30 DIAGNOSIS — I4891 Unspecified atrial fibrillation: Secondary | ICD-10-CM

## 2019-06-30 DIAGNOSIS — J4520 Mild intermittent asthma, uncomplicated: Secondary | ICD-10-CM | POA: Diagnosis not present

## 2019-06-30 DIAGNOSIS — I10 Essential (primary) hypertension: Secondary | ICD-10-CM | POA: Diagnosis not present

## 2019-06-30 MED ORDER — MONTELUKAST SODIUM 10 MG PO TABS: ORAL_TABLET | ORAL | 3 refills | Status: DC

## 2019-06-30 NOTE — Progress Notes (Signed)
[facility] [place], [place] [address]  Internal MEDICINE  Telephone Visit  Patient Name: Ashley Weiss  [id number]  [id number]  Date of Service: 07/01/2019  I connected with the patient at  By video  and verified the patients identity using two identifiers.   I discussed the limitations, risks, security and privacy concerns of performing an evaluation and management service by telephone and the availability of in person appointments. I also discussed with the patient that there may be a patient responsible charge related to the service.  The patient expressed understanding and agrees to proceed.    Chief Complaint  Patient presents with  . Telephone Assessment    [id number]  . Telephone Screen  . Hyperlipidemia  . Hypertension    BP at night 167/88  . Arthritis  . Hemorrhoids    bleeding twice   . Sore Throat    HPI I connected with the patient at  By video  and verified the patients identity using two identifiers. Both of her daughters are with her. She is feeling well. BP is under good control except few times.  They are monitoring her pulse as well,  Current Medication: Outpatient Encounter Medications as of 06/30/2019  Medication Sig  . albuterol (PROVENTIL HFA;VENTOLIN HFA) 108 (90 Base) MCG/ACT inhaler Inhale 2 puffs into the lungs every 6 (six) hours as needed for wheezing or shortness of breath.  Marland Kitchen apixaban (ELIQUIS) 2.5 MG TABS tablet Take 1 tablet (2.5 mg total) by mouth 2 (two) times daily.  . Blood Glucose Monitoring Suppl (FREESTYLE FREEDOM LITE) w/Device KIT 1 kit by Does not apply route daily.  Marland Kitchen denosumab (PROLIA) 60 MG/ML SOSY injection Inject 60 mg into the skin every 6 (six) months.  . diltiazem (CARDIZEM CD) 120 MG 24 hr capsule TAKE 1 CAPSULE BY MOUTH EVERY DAY  . Fluocinolone Acetonide 0.01 % OIL Place 4 drops in ear(s) at bedtime as needed.   . fluticasone (FLONASE) 50 MCG/ACT nasal spray PLACE 1 SPRAY INTO BOTH NOSTRILS DAILY.   . fluticasone (FLOVENT HFA) 110 MCG/ACT inhaler Inhale 2 puffs into the lungs 2 (two) times daily.  . furosemide (LASIX) 40 MG tablet Take 1 tablet (40 mg) by mouth once daily as needed for weight gain, increased shortness of breath, or swelling  . glucose blood (FREESTYLE LITE) test strip Use as directed once a daily Diag E11.65  . ipratropium-albuterol (DUONEB) 0.5-2.5 (3) MG/3ML SOLN USE 1 VIAL 3 TIMES DAILY AS NEEDED FOR SHORTNESS OF BREATH  . Lancets (FREESTYLE) lancets Use as directed once daily diag e11.65  . levothyroxine (SYNTHROID) 25 MCG tablet TAKE 1 DAILY BY MOUTH IN THE MORNING ON AN EMPTY STOMACH  . metoprolol tartrate (LOPRESSOR) 100 MG tablet Take 1 tablet (100 mg total) by mouth 2 (two) times daily.  . montelukast (SINGULAIR) 10 MG tablet TAKE 1 TABLET BY MOUTH EVERY DAY FOR COUGH  . Multiple Vitamins-Minerals (MULTIVITAMIN WITH MINERALS) tablet Take 1 tablet by mouth daily.  . OXYGEN Inhale 2 L into the lungs every evening. Sleep with nasal cannula w/ O2 at 2lpm.  . potassium chloride (KLOR-CON M10) 10 MEQ tablet TAKE 1 TABLET BY MOUTH EVERY DAY PRN ON DAYS WHEN TAKING LASIX  . Respiratory Therapy Supplies (ONE FLOW SPIROMETER) DEVI Please use device as tolerated and according to instruction provided.  . rosuvastatin (CRESTOR) 5 MG tablet Take 1 tablet (5 mg total) by mouth daily at 6 PM.  . sertraline (ZOLOFT) 100 MG tablet Take  1 tablet (100 mg total) by mouth daily.  . vitamin C (ASCORBIC ACID) 500 MG tablet Take 500 mg by mouth daily.  . [DISCONTINUED] montelukast (SINGULAIR) 10 MG tablet TAKE 1 TABLET BY MOUTH EVERY DAY FOR COUGH   No facility-administered encounter medications on file as of 06/30/2019.    Surgical History: Past Surgical History:  Procedure Laterality Date  . BREAST CYST ASPIRATION Left 20+ yrs ago  . CARDIOVERSION N/A 11/11/2018   Procedure: CARDIOVERSION;  Surgeon: Nelva Bush, MD;  Location: [facility] ORS;  Service: Cardiovascular;  Laterality:  N/A;  . CATARACT EXTRACTION Bilateral [id number]  . cyst removal-right shoulder  1972  . otosclerosis Left 1988   hardening of bone, other 1989 redone  . THYROIDECTOMY  1970   nodule and 1/2 bridge removal  . WRIST SURGERY Left 11/29/2009    Medical History: Past Medical History:  Diagnosis Date  . Arthritis   . Asthma   . Cancer ([place])    skin  . Depression   . DVT (deep venous thrombosis) ([place])   . Hyperlipidemia   . Hypertension   . Phlebitis     Family History: Family History  Problem Relation Age of Onset  . Osteoarthritis Mother   . Depression Father   . Breast cancer Neg Hx     Social History   Socioeconomic History  . Marital status: Widowed    Spouse name: Not on file  . Number of children: Not on file  . Years of education: Not on file  . Highest education level: Not on file  Occupational History  . Not on file  Tobacco Use  . Smoking status: Never Smoker  . Smokeless tobacco: Never Used  Substance and Sexual Activity  . Alcohol use: No  . Drug use: No  . Sexual activity: Never  Other Topics Concern  . Not on file  Social History Narrative  . Not on file   Social Determinants of Health   Financial Resource Strain:   . Difficulty of Paying Living Expenses:   Food Insecurity:   . Worried About Charity fundraiser in the Last Year:   . Arboriculturist in the Last Year:   Transportation Needs:   . Film/video editor (Medical):   Marland Kitchen Lack of Transportation (Non-Medical):   Physical Activity:   . Days of Exercise per Week:   . Minutes of Exercise per Session:   Stress:   . Feeling of Stress :   Social Connections:   . Frequency of Communication with Friends and Family:   . Frequency of Social Gatherings with Friends and Family:   . Attends Religious Services:   . Active Member of Clubs or Organizations:   . Attends Archivist Meetings:   Marland Kitchen Marital Status:   Intimate Partner Violence:   . Fear of Current or Ex-Partner:   .  Emotionally Abused:   Marland Kitchen Physically Abused:   . Sexually Abused:      Review of Systems  Constitutional: Negative.   HENT: Negative.   Respiratory: Negative.   Cardiovascular: Negative for chest pain.    Vital Signs: BP 130/80   Pulse 69   Resp 16   Ht 5' 2" (1.575 m)   Wt 124 lb 3.2 oz (56.3 kg)   SpO2 99% Comment: 1 litre  BMI 22.72 kg/m    Observation/Objective: NAD, very pleasant to talk to  Assessment/Plan: 1. Essential (primary) hypertension BP is slightly elevated at times. However she is feeling  well,   2. Atrial fibrillation with RVR ([facility]) Controlled   3. Mild intermittent chronic asthma without complication - DG Chest 2 View; Future, follow up w 4. Diabetes mellitus without complication ([place]) Controlled, DIET   General Counseling: Vernita verbalizes understanding of the findings of today's phone visit and agrees with plan of treatment. I have discussed any further diagnostic evaluation that may be needed or ordered today. We also reviewed her medications today. she has been encouraged to call the office with any questions or concerns that should arise related to todays visit.    Orders Placed This Encounter  Procedures  . DG Chest 2 View  . CBC with Differential/Platelet  . Lipid Panel With LDL/HDL Ratio  . TSH  . T4, free  . Comprehensive metabolic panel    Meds ordered this encounter  Medications  . montelukast (SINGULAIR) 10 MG tablet    Sig: TAKE 1 TABLET BY MOUTH EVERY DAY FOR COUGH    Dispense:  90 tablet    Refill:  3    Time spent :26 Minutes  Dr Lavera Guise Internal medicine

## 2019-07-02 ENCOUNTER — Telehealth: Payer: Self-pay

## 2019-07-02 DIAGNOSIS — R279 Unspecified lack of coordination: Secondary | ICD-10-CM | POA: Diagnosis not present

## 2019-07-02 DIAGNOSIS — I700 Atherosclerosis of aorta: Secondary | ICD-10-CM | POA: Diagnosis not present

## 2019-07-02 DIAGNOSIS — Z7189 Other specified counseling: Secondary | ICD-10-CM | POA: Diagnosis not present

## 2019-07-02 DIAGNOSIS — I10 Essential (primary) hypertension: Secondary | ICD-10-CM | POA: Diagnosis not present

## 2019-07-02 DIAGNOSIS — I2510 Atherosclerotic heart disease of native coronary artery without angina pectoris: Secondary | ICD-10-CM | POA: Diagnosis not present

## 2019-07-02 DIAGNOSIS — Z9981 Dependence on supplemental oxygen: Secondary | ICD-10-CM | POA: Diagnosis not present

## 2019-07-02 DIAGNOSIS — I672 Cerebral atherosclerosis: Secondary | ICD-10-CM | POA: Diagnosis not present

## 2019-07-02 DIAGNOSIS — I499 Cardiac arrhythmia, unspecified: Secondary | ICD-10-CM | POA: Diagnosis not present

## 2019-07-02 DIAGNOSIS — Z79899 Other long term (current) drug therapy: Secondary | ICD-10-CM | POA: Diagnosis not present

## 2019-07-02 DIAGNOSIS — R0689 Other abnormalities of breathing: Secondary | ICD-10-CM | POA: Diagnosis present

## 2019-07-02 DIAGNOSIS — F329 Major depressive disorder, single episode, unspecified: Secondary | ICD-10-CM | POA: Diagnosis present

## 2019-07-02 DIAGNOSIS — Y998 Other external cause status: Secondary | ICD-10-CM | POA: Diagnosis not present

## 2019-07-02 DIAGNOSIS — G3184 Mild cognitive impairment, so stated: Secondary | ICD-10-CM | POA: Diagnosis not present

## 2019-07-02 DIAGNOSIS — Z9181 History of falling: Secondary | ICD-10-CM | POA: Diagnosis not present

## 2019-07-02 DIAGNOSIS — R0789 Other chest pain: Secondary | ICD-10-CM | POA: Diagnosis not present

## 2019-07-02 DIAGNOSIS — Z7951 Long term (current) use of inhaled steroids: Secondary | ICD-10-CM | POA: Diagnosis not present

## 2019-07-02 DIAGNOSIS — J90 Pleural effusion, not elsewhere classified: Secondary | ICD-10-CM | POA: Diagnosis not present

## 2019-07-02 DIAGNOSIS — R079 Chest pain, unspecified: Secondary | ICD-10-CM | POA: Diagnosis not present

## 2019-07-02 DIAGNOSIS — E785 Hyperlipidemia, unspecified: Secondary | ICD-10-CM | POA: Diagnosis not present

## 2019-07-02 DIAGNOSIS — J45909 Unspecified asthma, uncomplicated: Secondary | ICD-10-CM | POA: Diagnosis not present

## 2019-07-02 DIAGNOSIS — W07XXXA Fall from chair, initial encounter: Secondary | ICD-10-CM | POA: Diagnosis not present

## 2019-07-02 DIAGNOSIS — Z79890 Hormone replacement therapy (postmenopausal): Secondary | ICD-10-CM | POA: Diagnosis not present

## 2019-07-02 DIAGNOSIS — I4891 Unspecified atrial fibrillation: Secondary | ICD-10-CM | POA: Diagnosis present

## 2019-07-02 DIAGNOSIS — J449 Chronic obstructive pulmonary disease, unspecified: Secondary | ICD-10-CM | POA: Diagnosis not present

## 2019-07-02 DIAGNOSIS — I509 Heart failure, unspecified: Secondary | ICD-10-CM | POA: Diagnosis not present

## 2019-07-02 DIAGNOSIS — E039 Hypothyroidism, unspecified: Secondary | ICD-10-CM | POA: Diagnosis present

## 2019-07-02 DIAGNOSIS — Z043 Encounter for examination and observation following other accident: Secondary | ICD-10-CM | POA: Diagnosis not present

## 2019-07-02 DIAGNOSIS — H9190 Unspecified hearing loss, unspecified ear: Secondary | ICD-10-CM | POA: Diagnosis present

## 2019-07-02 DIAGNOSIS — R519 Headache, unspecified: Secondary | ICD-10-CM | POA: Diagnosis present

## 2019-07-02 DIAGNOSIS — Y939 Activity, unspecified: Secondary | ICD-10-CM | POA: Diagnosis not present

## 2019-07-02 DIAGNOSIS — I5030 Unspecified diastolic (congestive) heart failure: Secondary | ICD-10-CM | POA: Diagnosis present

## 2019-07-02 DIAGNOSIS — Z7982 Long term (current) use of aspirin: Secondary | ICD-10-CM | POA: Diagnosis not present

## 2019-07-02 DIAGNOSIS — S2242XA Multiple fractures of ribs, left side, initial encounter for closed fracture: Secondary | ICD-10-CM | POA: Diagnosis present

## 2019-07-02 DIAGNOSIS — I110 Hypertensive heart disease with heart failure: Secondary | ICD-10-CM | POA: Diagnosis present

## 2019-07-02 DIAGNOSIS — R410 Disorientation, unspecified: Secondary | ICD-10-CM | POA: Diagnosis not present

## 2019-07-02 DIAGNOSIS — Z743 Need for continuous supervision: Secondary | ICD-10-CM | POA: Diagnosis not present

## 2019-07-02 DIAGNOSIS — I517 Cardiomegaly: Secondary | ICD-10-CM | POA: Diagnosis not present

## 2019-07-02 DIAGNOSIS — W01198A Fall on same level from slipping, tripping and stumbling with subsequent striking against other object, initial encounter: Secondary | ICD-10-CM | POA: Diagnosis not present

## 2019-07-02 DIAGNOSIS — Z7901 Long term (current) use of anticoagulants: Secondary | ICD-10-CM | POA: Diagnosis not present

## 2019-07-02 DIAGNOSIS — W133XXA Fall through floor, initial encounter: Secondary | ICD-10-CM | POA: Diagnosis not present

## 2019-07-02 NOTE — Telephone Encounter (Signed)
Pt daughter donna called because pt fell from sitting on a low stool in the bathroom, where she slipped off and went backwards. Pt hit her left side and the left front side is bruising. Pt daughter stated that pt bp this morning before taking bp meds was 152/87 with hr of 67 and 99 as her spo2. Pt daughter states pt pain level from 1-10 is 7 this morning. Pt has no pain while walking today but sitting up and getting up from sitting pt has pain. Pt daughter states that pt has been taking tylenol 500 mg, took 4 yesterday and has been applying a topical gel with menthol on areas of pain. Pt tried calling her ortho in [place] but they stated they cannot see her.  Advised pt daughter to try an [facility] near her so they can do xrays and so pt can be properly assessed, because they are not comfortable with going to the ER. Pt daughter states that pt is currently in 63 and nearest [facility] is in [place] after [employer] website for locations. Found phone number on the website and gave daughter the number. Advised pt daughter to give [place] a call if she has any more concerns.

## 2019-07-04 MED ORDER — ROSUVASTATIN CALCIUM 5 MG PO TABS
5.00 | ORAL_TABLET | ORAL | Status: DC
Start: 2019-07-05 — End: 2019-07-04

## 2019-07-04 MED ORDER — SENNOSIDES-DOCUSATE SODIUM 8.6-50 MG PO TABS
2.00 | ORAL_TABLET | ORAL | Status: DC
Start: 2019-07-05 — End: 2019-07-04

## 2019-07-04 MED ORDER — POLYETHYLENE GLYCOL 3350 17 GM/SCOOP PO POWD
17.00 | ORAL | Status: DC
Start: 2019-07-06 — End: 2019-07-04

## 2019-07-04 MED ORDER — MUPIROCIN 2 % EX OINT
TOPICAL_OINTMENT | CUTANEOUS | Status: DC
Start: 2019-07-04 — End: 2019-07-04

## 2019-07-04 MED ORDER — ACETAMINOPHEN 325 MG PO TABS
325.00 | ORAL_TABLET | ORAL | Status: DC
Start: 2019-07-05 — End: 2019-07-04

## 2019-07-04 MED ORDER — METOPROLOL TARTRATE 50 MG PO TABS
100.00 | ORAL_TABLET | ORAL | Status: DC
Start: 2019-07-05 — End: 2019-07-04

## 2019-07-04 MED ORDER — MONTELUKAST SODIUM 10 MG PO TABS
10.00 | ORAL_TABLET | ORAL | Status: DC
Start: 2019-07-06 — End: 2019-07-04

## 2019-07-04 MED ORDER — BUDESONIDE 0.5 MG/2ML IN SUSP
1.00 | RESPIRATORY_TRACT | Status: DC
Start: 2019-07-05 — End: 2019-07-04

## 2019-07-04 MED ORDER — DOCUSATE SODIUM 283 MG RE ENEM
283.00 | ENEMA | RECTAL | Status: DC
Start: ? — End: 2019-07-04

## 2019-07-04 MED ORDER — ALBUTEROL SULFATE (2.5 MG/3ML) 0.083% IN NEBU
2.50 | INHALATION_SOLUTION | RESPIRATORY_TRACT | Status: DC
Start: ? — End: 2019-07-04

## 2019-07-04 MED ORDER — FUROSEMIDE 40 MG PO TABS
40.00 | ORAL_TABLET | ORAL | Status: DC
Start: ? — End: 2019-07-04

## 2019-07-04 MED ORDER — HYDROCODONE-ACETAMINOPHEN 5-325 MG PO TABS
1.00 | ORAL_TABLET | ORAL | Status: DC
Start: ? — End: 2019-07-04

## 2019-07-04 MED ORDER — LEVOTHYROXINE SODIUM 25 MCG PO TABS
25.00 | ORAL_TABLET | ORAL | Status: DC
Start: 2019-07-06 — End: 2019-07-04

## 2019-07-04 MED ORDER — ENOXAPARIN SODIUM 40 MG/0.4ML ~~LOC~~ SOLN
0.60 | SUBCUTANEOUS | Status: DC
Start: 2019-07-05 — End: 2019-07-04

## 2019-07-04 MED ORDER — TRAMADOL HCL 50 MG PO TABS
50.00 | ORAL_TABLET | ORAL | Status: DC
Start: ? — End: 2019-07-04

## 2019-07-04 MED ORDER — SERTRALINE HCL 25 MG PO TABS
100.00 | ORAL_TABLET | ORAL | Status: DC
Start: 2019-07-05 — End: 2019-07-04

## 2019-07-04 MED ORDER — BISACODYL 10 MG RE SUPP
10.00 | RECTAL | Status: DC
Start: ? — End: 2019-07-04

## 2019-07-04 MED ORDER — ONDANSETRON HCL 4 MG/2ML IJ SOLN
4.00 | INTRAMUSCULAR | Status: DC
Start: ? — End: 2019-07-04

## 2019-07-04 MED ORDER — DILTIAZEM HCL ER BEADS 120 MG PO CP24
120.00 | ORAL_CAPSULE | ORAL | Status: DC
Start: 2019-07-06 — End: 2019-07-04

## 2019-07-06 ENCOUNTER — Other Ambulatory Visit: Payer: Self-pay | Admitting: Internal Medicine

## 2019-07-06 DIAGNOSIS — I10 Essential (primary) hypertension: Secondary | ICD-10-CM

## 2019-07-06 DIAGNOSIS — R0602 Shortness of breath: Secondary | ICD-10-CM

## 2019-07-06 DIAGNOSIS — E119 Type 2 diabetes mellitus without complications: Secondary | ICD-10-CM

## 2019-07-06 DIAGNOSIS — J4520 Mild intermittent asthma, uncomplicated: Secondary | ICD-10-CM

## 2019-07-06 DIAGNOSIS — I4891 Unspecified atrial fibrillation: Secondary | ICD-10-CM

## 2019-07-06 DIAGNOSIS — Z1231 Encounter for screening mammogram for malignant neoplasm of breast: Secondary | ICD-10-CM

## 2019-07-06 MED ORDER — MAGNESIUM OXIDE 400 MG PO TABS
400.00 | ORAL_TABLET | ORAL | Status: DC
Start: 2019-07-05 — End: 2019-07-06

## 2019-07-07 ENCOUNTER — Encounter: Payer: Self-pay | Admitting: Adult Health

## 2019-07-07 ENCOUNTER — Ambulatory Visit (INDEPENDENT_AMBULATORY_CARE_PROVIDER_SITE_OTHER): Payer: Medicare Other | Admitting: Adult Health

## 2019-07-07 VITALS — BP 161/81 | HR 77 | Resp 16 | Ht 62.0 in | Wt 123.8 lb

## 2019-07-07 DIAGNOSIS — E119 Type 2 diabetes mellitus without complications: Secondary | ICD-10-CM | POA: Diagnosis not present

## 2019-07-07 DIAGNOSIS — I4891 Unspecified atrial fibrillation: Secondary | ICD-10-CM | POA: Diagnosis not present

## 2019-07-07 DIAGNOSIS — W19XXXD Unspecified fall, subsequent encounter: Secondary | ICD-10-CM | POA: Diagnosis not present

## 2019-07-07 DIAGNOSIS — I10 Essential (primary) hypertension: Secondary | ICD-10-CM

## 2019-07-07 DIAGNOSIS — S2242XD Multiple fractures of ribs, left side, subsequent encounter for fracture with routine healing: Secondary | ICD-10-CM | POA: Diagnosis not present

## 2019-07-07 DIAGNOSIS — Z7901 Long term (current) use of anticoagulants: Secondary | ICD-10-CM

## 2019-07-07 DIAGNOSIS — Z602 Problems related to living alone: Secondary | ICD-10-CM | POA: Diagnosis not present

## 2019-07-07 DIAGNOSIS — I110 Hypertensive heart disease with heart failure: Secondary | ICD-10-CM | POA: Diagnosis not present

## 2019-07-07 DIAGNOSIS — Z9981 Dependence on supplemental oxygen: Secondary | ICD-10-CM | POA: Diagnosis not present

## 2019-07-07 DIAGNOSIS — Z9181 History of falling: Secondary | ICD-10-CM | POA: Diagnosis not present

## 2019-07-07 DIAGNOSIS — I509 Heart failure, unspecified: Secondary | ICD-10-CM | POA: Diagnosis not present

## 2019-07-07 DIAGNOSIS — H9190 Unspecified hearing loss, unspecified ear: Secondary | ICD-10-CM | POA: Diagnosis not present

## 2019-07-07 DIAGNOSIS — J45909 Unspecified asthma, uncomplicated: Secondary | ICD-10-CM | POA: Diagnosis not present

## 2019-07-07 MED ORDER — METOPROLOL TARTRATE 25 MG PO TABS: 25.0000 mg | ORAL_TABLET | Freq: Every day | ORAL | 0 refills | Status: DC | PRN

## 2019-07-07 NOTE — Progress Notes (Signed)
[facility], [place] [address]  Internal MEDICINE  Telephone Visit  Patient Name: Ashley Weiss  [id number]  [id number]  Date of Service: 07/08/2019  I connected with the patient at 1052 byvideo and verified the patients identity using two identifiers.   I discussed the limitations, risks, security and privacy concerns of performing an evaluation and management service by telephone and the availability of in person appointments. I also discussed with the patient that there may be a patient responsible charge related to the service.  The patient expressed Weiss and agrees to proceed.    Chief Complaint  Patient presents with  . Telephone Assessment  . Telephone Screen  . Hospitalization Follow-up    fractured rib eliquis question think she was overdosed in hospital     HPI  Pt seen via video. Her daughters are on video with her today.  They report the patient had a fall on 07/01/19.  On 07/02/19 she was having pain.  She was taken to the ER, and ultimately ended up admitted at [place] ICU for Multiple Rib fractures.  She fractured the 9th, 10th and 12th rib on her left side and had a trace pleural effusion. She was given pain medication and was discharged on 07/05/19.  She apparently received TWO Norco tablets for pain the night before discharge and had slurring speech the next day. She also had some disorientation.  This resolved once she had been home awhile.  She has been up and walking with a walker, and family reports she appears to be doing well.  They stopped her eliquis while in the hospital and left it up to the family and [place] to restart. There are PT, OT and Home health nursing orders for the patient at this time.  PT has already begun.     Current Medication: Outpatient Encounter Medications as of 07/07/2019  Medication Sig  . albuterol (PROVENTIL HFA;VENTOLIN HFA) 108 (90 Base) MCG/ACT inhaler Inhale 2 puffs into the  lungs every 6 (six) hours as needed for wheezing or shortness of breath.  Marland Kitchen apixaban (ELIQUIS) 2.5 MG TABS tablet Take 1 tablet (2.5 mg total) by mouth 2 (two) times daily.  . Blood Glucose Monitoring Suppl (FREESTYLE FREEDOM LITE) w/Device KIT 1 kit by Does not apply route daily.  Marland Kitchen denosumab (PROLIA) 60 MG/ML SOSY injection Inject 60 mg into the skin every 6 (six) months.  . diltiazem (CARDIZEM CD) 120 MG 24 hr capsule TAKE 1 CAPSULE BY MOUTH EVERY DAY  . Fluocinolone Acetonide 0.01 % OIL Place 4 drops in ear(s) at bedtime as needed.   . fluticasone (FLONASE) 50 MCG/ACT nasal spray PLACE 1 SPRAY INTO BOTH NOSTRILS DAILY.  . fluticasone (FLOVENT HFA) 110 MCG/ACT inhaler Inhale 2 puffs into the lungs 2 (two) times daily.  . furosemide (LASIX) 40 MG tablet Take 1 tablet (40 mg) by mouth once daily as needed for weight gain, increased shortness of breath, or swelling  . glucose blood (FREESTYLE LITE) test strip Use as directed once a daily Diag E11.65  . ipratropium-albuterol (DUONEB) 0.5-2.5 (3) MG/3ML SOLN USE 1 VIAL 3 TIMES DAILY AS NEEDED FOR SHORTNESS OF BREATH  . Lancets (FREESTYLE) lancets Use as directed once daily diag e11.65  . levothyroxine (SYNTHROID) 25 MCG tablet TAKE 1 DAILY BY MOUTH IN THE MORNING ON AN EMPTY STOMACH  . metoprolol tartrate (LOPRESSOR) 100 MG tablet Take 1 tablet (100 mg total) by mouth 2 (two) times daily.  Marland Kitchen  montelukast (SINGULAIR) 10 MG tablet TAKE 1 TABLET BY MOUTH EVERY DAY FOR COUGH  . Multiple Vitamins-Minerals (MULTIVITAMIN WITH MINERALS) tablet Take 1 tablet by mouth daily.  . OXYGEN Inhale 2 L into the lungs every evening. Sleep with nasal cannula w/ O2 at 2lpm.  . potassium chloride (KLOR-CON M10) 10 MEQ tablet TAKE 1 TABLET BY MOUTH EVERY DAY PRN ON DAYS WHEN TAKING LASIX  . Respiratory Therapy Supplies (ONE FLOW SPIROMETER) DEVI Please use device as tolerated and according to instruction provided.  . rosuvastatin (CRESTOR) 5 MG tablet Take 1 tablet (5 mg  total) by mouth daily at 6 PM.  . sertraline (ZOLOFT) 100 MG tablet Take 1 tablet (100 mg total) by mouth daily.  . vitamin C (ASCORBIC ACID) 500 MG tablet Take 500 mg by mouth daily.  . metoprolol tartrate (LOPRESSOR) 25 MG tablet Take 1 tablet (25 mg total) by mouth daily as needed (for a fib).   No facility-administered encounter medications on file as of 07/07/2019.    Surgical History: Past Surgical History:  Procedure Laterality Date  . BREAST CYST ASPIRATION Left 20+ yrs ago  . CARDIOVERSION N/A 11/11/2018   Procedure: CARDIOVERSION;  Surgeon: Nelva Bush, MD;  Location: [facility] ORS;  Service: Cardiovascular;  Laterality: N/A;  . CATARACT EXTRACTION Bilateral [id number]  . cyst removal-right shoulder  1972  . otosclerosis Left 1988   hardening of bone, other 1989 redone  . THYROIDECTOMY  1970   nodule and 1/2 bridge removal  . WRIST SURGERY Left 11/29/2009    Medical History: Past Medical History:  Diagnosis Date  . Arthritis   . Asthma   . Cancer ([place])    skin  . Depression   . DVT (deep venous thrombosis) ([place])   . Hyperlipidemia   . Hypertension   . Phlebitis     Family History: Family History  Problem Relation Age of Onset  . Osteoarthritis Mother   . Depression Father   . Breast cancer Neg Hx     Social History   Socioeconomic History  . Marital status: Widowed    Spouse name: Not on file  . Number of children: Not on file  . Years of education: Not on file  . Highest education level: Not on file  Occupational History  . Not on file  Tobacco Use  . Smoking status: Never Smoker  . Smokeless tobacco: Never Used  Substance and Sexual Activity  . Alcohol use: No  . Drug use: No  . Sexual activity: Never  Other Topics Concern  . Not on file  Social History Narrative  . Not on file   Social Determinants of Health   Financial Resource Strain:   . Difficulty of Paying Living Expenses:   Food Insecurity:   . Worried About Charity fundraiser  in the Last Year:   . Arboriculturist in the Last Year:   Transportation Needs:   . Film/video editor (Medical):   Marland Kitchen Lack of Transportation (Non-Medical):   Physical Activity:   . Days of Exercise per Week:   . Minutes of Exercise per Session:   Stress:   . Feeling of Stress :   Social Connections:   . Frequency of Communication with Friends and Family:   . Frequency of Social Gatherings with Friends and Family:   . Attends Religious Services:   . Active Member of Clubs or Organizations:   . Attends Archivist Meetings:   Marland Kitchen Marital Status:  Intimate Partner Violence:   . Fear of Current or Ex-Partner:   . Emotionally Abused:   Marland Kitchen Physically Abused:   . Sexually Abused:       Review of Systems  Constitutional: Negative for chills, fatigue and unexpected weight change.  HENT: Negative for congestion, rhinorrhea, sneezing and sore throat.   Eyes: Negative for photophobia, pain and redness.  Respiratory: Negative for cough, chest tightness and shortness of breath.   Cardiovascular: Negative for chest pain and palpitations.  Gastrointestinal: Negative for abdominal pain, constipation, diarrhea, nausea and vomiting.  Endocrine: Negative.   Genitourinary: Negative for dysuria and frequency.  Musculoskeletal: Negative for arthralgias, back pain, joint swelling and neck pain.       Left side pain  Skin: Negative for rash.  Allergic/Immunologic: Negative.   Neurological: Negative for tremors and numbness.  Hematological: Negative for adenopathy. Does not bruise/bleed easily.  Psychiatric/Behavioral: Negative for behavioral problems and sleep disturbance. The patient is not nervous/anxious.     Vital Signs: BP (!) 161/81   Pulse 77   Resp 16   Ht 5' 2"  (1.575 m)   Wt 123 lb 12.8 oz (56.2 kg)   SpO2 99%   BMI 22.64 kg/m    Observation/Objective:  Frail appearing. NAD noted   Assessment/Plan: 1. Closed fracture of multiple ribs of left side with routine  healing, subsequent encounter Continue to do physical thearpy, and OT as recommended.  Take tylenol as before for pain. Currently well controlled.   2. Atrial fibrillation with RVR (HCC) Low metoprolol prescribed for episodes of a-fib as discussed.  Family requested this medication per cardiology. Discussed increased risk of falls with beta blockers.  - metoprolol tartrate (LOPRESSOR) 25 MG tablet; Take 1 tablet (25 mg total) by mouth daily as needed (for a fib).  Dispense: 30 tablet; Refill: 0  3. Encounter for current long-term use of anticoagulants Had a long discussion about anticoagulant use and the possibility of falls with worse outcomes in the future.  Specifically, cranial bleeds.  The family verbalized Weiss, and reports they feel like they can control her falling, but have no defense from a future CVA if she doesn't take eliquis.  They have discussed it and would like to restart her eliquis.  I once again reviewed the safety precautions when using eliquis with patients who fall. They understand that if she falls in the future she should be seen Immediately, especially if she hits her head.   4. Essential (primary) hypertension Stable currently, continue as prescribed.   5. Diabetes mellitus without complication ([place]) Controlled with diet.  Continue to monitor.  General Counseling: Ashley Weiss of the findings of today's phone visit and agrees with plan of treatment. I have discussed any further diagnostic evaluation that may be needed or ordered today. We also reviewed her medications today. she has been encouraged to call the office with any questions or concerns that should arise related to todays visit.    No orders of the defined types were placed in this encounter.   Meds ordered this encounter  Medications  . metoprolol tartrate (LOPRESSOR) 25 MG tablet    Sig: Take 1 tablet (25 mg total) by mouth daily as needed (for a fib).    Dispense:  30  tablet    Refill:  0    Time spent: [place] AGNP-C Internal medicine

## 2019-07-08 ENCOUNTER — Telehealth: Payer: Self-pay

## 2019-07-08 ENCOUNTER — Other Ambulatory Visit: Payer: Self-pay

## 2019-07-08 DIAGNOSIS — I4891 Unspecified atrial fibrillation: Secondary | ICD-10-CM | POA: Diagnosis not present

## 2019-07-08 DIAGNOSIS — J45909 Unspecified asthma, uncomplicated: Secondary | ICD-10-CM | POA: Diagnosis not present

## 2019-07-08 DIAGNOSIS — H9190 Unspecified hearing loss, unspecified ear: Secondary | ICD-10-CM | POA: Diagnosis not present

## 2019-07-08 DIAGNOSIS — I509 Heart failure, unspecified: Secondary | ICD-10-CM | POA: Diagnosis not present

## 2019-07-08 DIAGNOSIS — S2242XD Multiple fractures of ribs, left side, subsequent encounter for fracture with routine healing: Secondary | ICD-10-CM | POA: Diagnosis not present

## 2019-07-08 DIAGNOSIS — I110 Hypertensive heart disease with heart failure: Secondary | ICD-10-CM | POA: Diagnosis not present

## 2019-07-08 NOTE — Telephone Encounter (Signed)
Gave verbal order pat from [facility] [id number] for Physical therapy 2 times a week for 2 weeks and 1 times a week for 2 weeks

## 2019-07-09 ENCOUNTER — Telehealth: Payer: Self-pay

## 2019-07-09 NOTE — Telephone Encounter (Signed)
Gave verbal order for occupational therapy [id number] 1 week for 4 ,2 week for 2 and 1 week 2

## 2019-07-12 DIAGNOSIS — I4891 Unspecified atrial fibrillation: Secondary | ICD-10-CM | POA: Diagnosis not present

## 2019-07-12 DIAGNOSIS — I110 Hypertensive heart disease with heart failure: Secondary | ICD-10-CM | POA: Diagnosis not present

## 2019-07-12 DIAGNOSIS — J45909 Unspecified asthma, uncomplicated: Secondary | ICD-10-CM | POA: Diagnosis not present

## 2019-07-12 DIAGNOSIS — S2242XD Multiple fractures of ribs, left side, subsequent encounter for fracture with routine healing: Secondary | ICD-10-CM | POA: Diagnosis not present

## 2019-07-12 DIAGNOSIS — H9190 Unspecified hearing loss, unspecified ear: Secondary | ICD-10-CM | POA: Diagnosis not present

## 2019-07-12 DIAGNOSIS — I509 Heart failure, unspecified: Secondary | ICD-10-CM | POA: Diagnosis not present

## 2019-07-13 DIAGNOSIS — I509 Heart failure, unspecified: Secondary | ICD-10-CM | POA: Diagnosis not present

## 2019-07-13 DIAGNOSIS — J45909 Unspecified asthma, uncomplicated: Secondary | ICD-10-CM | POA: Diagnosis not present

## 2019-07-13 DIAGNOSIS — H9190 Unspecified hearing loss, unspecified ear: Secondary | ICD-10-CM | POA: Diagnosis not present

## 2019-07-13 DIAGNOSIS — I4891 Unspecified atrial fibrillation: Secondary | ICD-10-CM | POA: Diagnosis not present

## 2019-07-13 DIAGNOSIS — I110 Hypertensive heart disease with heart failure: Secondary | ICD-10-CM | POA: Diagnosis not present

## 2019-07-13 DIAGNOSIS — S2242XD Multiple fractures of ribs, left side, subsequent encounter for fracture with routine healing: Secondary | ICD-10-CM | POA: Diagnosis not present

## 2019-07-14 DIAGNOSIS — I110 Hypertensive heart disease with heart failure: Secondary | ICD-10-CM | POA: Diagnosis not present

## 2019-07-14 DIAGNOSIS — I509 Heart failure, unspecified: Secondary | ICD-10-CM | POA: Diagnosis not present

## 2019-07-14 DIAGNOSIS — I4891 Unspecified atrial fibrillation: Secondary | ICD-10-CM | POA: Diagnosis not present

## 2019-07-14 DIAGNOSIS — S2242XD Multiple fractures of ribs, left side, subsequent encounter for fracture with routine healing: Secondary | ICD-10-CM | POA: Diagnosis not present

## 2019-07-14 DIAGNOSIS — H9190 Unspecified hearing loss, unspecified ear: Secondary | ICD-10-CM | POA: Diagnosis not present

## 2019-07-14 DIAGNOSIS — J45909 Unspecified asthma, uncomplicated: Secondary | ICD-10-CM | POA: Diagnosis not present

## 2019-07-20 DIAGNOSIS — S2242XD Multiple fractures of ribs, left side, subsequent encounter for fracture with routine healing: Secondary | ICD-10-CM | POA: Diagnosis not present

## 2019-07-21 DIAGNOSIS — I509 Heart failure, unspecified: Secondary | ICD-10-CM | POA: Diagnosis not present

## 2019-07-21 DIAGNOSIS — I4891 Unspecified atrial fibrillation: Secondary | ICD-10-CM | POA: Diagnosis not present

## 2019-07-21 DIAGNOSIS — I110 Hypertensive heart disease with heart failure: Secondary | ICD-10-CM | POA: Diagnosis not present

## 2019-07-21 DIAGNOSIS — J45909 Unspecified asthma, uncomplicated: Secondary | ICD-10-CM | POA: Diagnosis not present

## 2019-07-21 DIAGNOSIS — H9190 Unspecified hearing loss, unspecified ear: Secondary | ICD-10-CM | POA: Diagnosis not present

## 2019-07-21 DIAGNOSIS — S2242XD Multiple fractures of ribs, left side, subsequent encounter for fracture with routine healing: Secondary | ICD-10-CM | POA: Diagnosis not present

## 2019-07-23 DIAGNOSIS — I110 Hypertensive heart disease with heart failure: Secondary | ICD-10-CM | POA: Diagnosis not present

## 2019-07-23 DIAGNOSIS — J45909 Unspecified asthma, uncomplicated: Secondary | ICD-10-CM | POA: Diagnosis not present

## 2019-07-23 DIAGNOSIS — I509 Heart failure, unspecified: Secondary | ICD-10-CM | POA: Diagnosis not present

## 2019-07-23 DIAGNOSIS — H9190 Unspecified hearing loss, unspecified ear: Secondary | ICD-10-CM | POA: Diagnosis not present

## 2019-07-23 DIAGNOSIS — S2242XD Multiple fractures of ribs, left side, subsequent encounter for fracture with routine healing: Secondary | ICD-10-CM | POA: Diagnosis not present

## 2019-07-23 DIAGNOSIS — I4891 Unspecified atrial fibrillation: Secondary | ICD-10-CM | POA: Diagnosis not present

## 2019-07-26 DIAGNOSIS — I4891 Unspecified atrial fibrillation: Secondary | ICD-10-CM | POA: Diagnosis not present

## 2019-07-26 DIAGNOSIS — S2242XD Multiple fractures of ribs, left side, subsequent encounter for fracture with routine healing: Secondary | ICD-10-CM | POA: Diagnosis not present

## 2019-07-26 DIAGNOSIS — I110 Hypertensive heart disease with heart failure: Secondary | ICD-10-CM | POA: Diagnosis not present

## 2019-07-26 DIAGNOSIS — I509 Heart failure, unspecified: Secondary | ICD-10-CM | POA: Diagnosis not present

## 2019-07-26 DIAGNOSIS — H9190 Unspecified hearing loss, unspecified ear: Secondary | ICD-10-CM | POA: Diagnosis not present

## 2019-07-26 DIAGNOSIS — J45909 Unspecified asthma, uncomplicated: Secondary | ICD-10-CM | POA: Diagnosis not present

## 2019-07-27 DIAGNOSIS — H9190 Unspecified hearing loss, unspecified ear: Secondary | ICD-10-CM | POA: Diagnosis not present

## 2019-07-27 DIAGNOSIS — J45909 Unspecified asthma, uncomplicated: Secondary | ICD-10-CM | POA: Diagnosis not present

## 2019-07-27 DIAGNOSIS — I110 Hypertensive heart disease with heart failure: Secondary | ICD-10-CM | POA: Diagnosis not present

## 2019-07-27 DIAGNOSIS — I4891 Unspecified atrial fibrillation: Secondary | ICD-10-CM | POA: Diagnosis not present

## 2019-07-27 DIAGNOSIS — I509 Heart failure, unspecified: Secondary | ICD-10-CM | POA: Diagnosis not present

## 2019-07-27 DIAGNOSIS — S2242XD Multiple fractures of ribs, left side, subsequent encounter for fracture with routine healing: Secondary | ICD-10-CM | POA: Diagnosis not present

## 2019-07-29 ENCOUNTER — Other Ambulatory Visit: Payer: Self-pay | Admitting: Adult Health

## 2019-07-29 DIAGNOSIS — I4891 Unspecified atrial fibrillation: Secondary | ICD-10-CM

## 2019-07-29 DIAGNOSIS — M810 Age-related osteoporosis without current pathological fracture: Secondary | ICD-10-CM | POA: Diagnosis not present

## 2019-07-30 ENCOUNTER — Other Ambulatory Visit: Payer: Self-pay

## 2019-07-30 MED ORDER — ALBUTEROL SULFATE HFA 108 (90 BASE) MCG/ACT IN AERS: 2.0000 | INHALATION_SPRAY | Freq: Four times a day (QID) | RESPIRATORY_TRACT | 3 refills | Status: DC | PRN

## 2019-08-02 ENCOUNTER — Other Ambulatory Visit: Payer: Self-pay

## 2019-08-02 DIAGNOSIS — J45909 Unspecified asthma, uncomplicated: Secondary | ICD-10-CM | POA: Diagnosis not present

## 2019-08-02 DIAGNOSIS — H9190 Unspecified hearing loss, unspecified ear: Secondary | ICD-10-CM | POA: Diagnosis not present

## 2019-08-02 DIAGNOSIS — I4891 Unspecified atrial fibrillation: Secondary | ICD-10-CM | POA: Diagnosis not present

## 2019-08-02 DIAGNOSIS — I110 Hypertensive heart disease with heart failure: Secondary | ICD-10-CM | POA: Diagnosis not present

## 2019-08-02 DIAGNOSIS — I509 Heart failure, unspecified: Secondary | ICD-10-CM | POA: Diagnosis not present

## 2019-08-02 DIAGNOSIS — S2242XD Multiple fractures of ribs, left side, subsequent encounter for fracture with routine healing: Secondary | ICD-10-CM | POA: Diagnosis not present

## 2019-08-02 MED ORDER — ALBUTEROL SULFATE HFA 108 (90 BASE) MCG/ACT IN AERS: 2.0000 | INHALATION_SPRAY | Freq: Four times a day (QID) | RESPIRATORY_TRACT | 3 refills | Status: DC | PRN

## 2019-08-03 DIAGNOSIS — S2242XD Multiple fractures of ribs, left side, subsequent encounter for fracture with routine healing: Secondary | ICD-10-CM | POA: Diagnosis not present

## 2019-08-03 DIAGNOSIS — I509 Heart failure, unspecified: Secondary | ICD-10-CM | POA: Diagnosis not present

## 2019-08-03 DIAGNOSIS — J45909 Unspecified asthma, uncomplicated: Secondary | ICD-10-CM | POA: Diagnosis not present

## 2019-08-03 DIAGNOSIS — I110 Hypertensive heart disease with heart failure: Secondary | ICD-10-CM | POA: Diagnosis not present

## 2019-08-03 DIAGNOSIS — I4891 Unspecified atrial fibrillation: Secondary | ICD-10-CM | POA: Diagnosis not present

## 2019-08-03 DIAGNOSIS — H9190 Unspecified hearing loss, unspecified ear: Secondary | ICD-10-CM | POA: Diagnosis not present

## 2019-08-04 ENCOUNTER — Other Ambulatory Visit: Payer: Self-pay | Admitting: Adult Health

## 2019-08-04 ENCOUNTER — Telehealth: Payer: Self-pay

## 2019-08-04 NOTE — Telephone Encounter (Signed)
Plan of care signed and mailed back to [facility].

## 2019-08-04 NOTE — Telephone Encounter (Signed)
Plan of care signed and faxed back to [facility].

## 2019-08-05 DIAGNOSIS — I4891 Unspecified atrial fibrillation: Secondary | ICD-10-CM | POA: Diagnosis not present

## 2019-08-05 DIAGNOSIS — I509 Heart failure, unspecified: Secondary | ICD-10-CM | POA: Diagnosis not present

## 2019-08-05 DIAGNOSIS — H9190 Unspecified hearing loss, unspecified ear: Secondary | ICD-10-CM | POA: Diagnosis not present

## 2019-08-05 DIAGNOSIS — S2242XD Multiple fractures of ribs, left side, subsequent encounter for fracture with routine healing: Secondary | ICD-10-CM | POA: Diagnosis not present

## 2019-08-05 DIAGNOSIS — J45909 Unspecified asthma, uncomplicated: Secondary | ICD-10-CM | POA: Diagnosis not present

## 2019-08-05 DIAGNOSIS — I110 Hypertensive heart disease with heart failure: Secondary | ICD-10-CM | POA: Diagnosis not present

## 2019-08-06 DIAGNOSIS — S2242XD Multiple fractures of ribs, left side, subsequent encounter for fracture with routine healing: Secondary | ICD-10-CM | POA: Diagnosis not present

## 2019-08-07 ENCOUNTER — Other Ambulatory Visit: Payer: Self-pay | Admitting: Cardiovascular Disease

## 2019-08-10 ENCOUNTER — Telehealth: Payer: Self-pay

## 2019-08-10 NOTE — Telephone Encounter (Signed)
Physician order signed and faxed back to [facility].

## 2019-08-12 ENCOUNTER — Other Ambulatory Visit: Payer: Self-pay

## 2019-08-12 ENCOUNTER — Encounter: Payer: Self-pay | Admitting: Cardiovascular Disease

## 2019-08-12 ENCOUNTER — Ambulatory Visit (INDEPENDENT_AMBULATORY_CARE_PROVIDER_SITE_OTHER): Payer: Medicare Other | Admitting: Cardiovascular Disease

## 2019-08-12 VITALS — BP 138/70 | HR 77 | Ht 63.0 in | Wt 122.5 lb

## 2019-08-12 DIAGNOSIS — I5032 Chronic diastolic (congestive) heart failure: Secondary | ICD-10-CM | POA: Diagnosis not present

## 2019-08-12 DIAGNOSIS — I4819 Other persistent atrial fibrillation: Secondary | ICD-10-CM

## 2019-08-12 DIAGNOSIS — I10 Essential (primary) hypertension: Secondary | ICD-10-CM

## 2019-08-12 MED ORDER — METOPROLOL TARTRATE 100 MG PO TABS: ORAL_TABLET | ORAL | 2 refills | Status: DC

## 2019-08-12 MED ORDER — ROSUVASTATIN CALCIUM 5 MG PO TABS: 5.0000 mg | ORAL_TABLET | Freq: Every day | ORAL | 2 refills | Status: DC

## 2019-08-12 MED ORDER — METOPROLOL TARTRATE 25 MG PO TABS: 25.0000 mg | ORAL_TABLET | Freq: Every day | ORAL | 0 refills | Status: DC | PRN

## 2019-08-12 MED ORDER — APIXABAN 2.5 MG PO TABS: 2.5000 mg | ORAL_TABLET | Freq: Two times a day (BID) | ORAL | 0 refills | Status: DC

## 2019-08-12 MED ORDER — DILTIAZEM HCL ER COATED BEADS 120 MG PO CP24: ORAL_CAPSULE | ORAL | 2 refills | Status: DC

## 2019-08-12 NOTE — Patient Instructions (Signed)
Medication Instructions:  Your physician recommends that you continue on your current medications as directed. Please refer to the Current Medication list given to you today.  Your cardiac medications have been refilled today.  *If you need a refill on your cardiac medications before your next appointment, please call your pharmacy*   Lab Work: None ordered If you have labs (blood work) drawn today and your tests are completely normal, you will receive your results only by: Marland Kitchen MyChart Message (if you have MyChart) OR . A paper copy in the mail If you have any lab test that is abnormal or we need to change your treatment, we will call you to review the results.   Testing/Procedures: None ordered   Follow-Up: At [facility], you and your health needs are our priority.  As part of our continuing mission to provide you with exceptional heart care, we have created designated Provider Care Teams.  These Care Teams include your primary Cardiologist (physician) and Advanced Practice Providers (APPs -  Physician Assistants and Nurse Practitioners) who all work together to provide you with the care you need, when you need it.  We recommend signing up for the patient portal called "MyChart".  Sign up information is provided on this After Visit Summary.  MyChart is used to connect with patients for Virtual Visits (Telemedicine).  Patients are able to view lab/test results, encounter notes, upcoming appointments, etc.  Non-urgent messages can be sent to your provider as well.   To learn more about what you can do with MyChart, go to [email or web address].    Your next appointment:   2 month(s)  The format for your next appointment:   In Person  Provider:    You may see Kathlyn Sacramento, MD or one of the following Advanced Practice Providers on your designated Care Team:    Murray Hodgkins, NP  Christell Faith, PA-C  Marrianne Mood, PA-C    Other Instructions N/A

## 2019-08-12 NOTE — Progress Notes (Signed)
Cardiology Office Note   Date:  08/12/2019   ID:  DEVERY ODWYER, DOB March 13, 1920, MRN [id number]  PCP:  Lavera Guise, MD  Cardiologist:   Kathlyn Sacramento, MD   Chief Complaint  Patient presents with  . OTHER    3 month f/u please verify how pt should be taking Metoprolol Tartrate. Meds reviewed verbally with pt.      History of Present Illness: Ashley Weiss is a 86 y.o. female who is here today for follow-up visit regarding persistent atrial fibrillation status post cardioversion  and chronic diastolic heart failure.  She has history of hyperlipidemia, essential hypertension, hyperlipidemia and persistent atrial fibrillation.   Rate control was very difficult  and ultimately she underwent successful cardioversion in July after starting amiodarone. Echocardiogram in July,2020 showed an EF of 55 to 60% with moderately dilated left atrium, moderate pulmonary hypertension and moderate tricuspid regurgitation. Amiodarone was stopped in January due to concerns about lung toxicity requiring oxygen therapy.  Chest x-ray showed interstitial lung disease. Pulmonary function testing showed severe restrictive lung disease with severely decreased DLCO. She fell in March and was hospitalized at [facility] for multiple rib fractures.  Due to fall, her Eliquis was initially stopped but resumed after follow-up with her primary care physician.  She has been doing reasonably well with improvement in chest pain related to rib fractures.  She has mild exertional dyspnea with no palpitations.  She finished physical therapy.  She did fall again last week with some bruising of her right leg that seems to be improving.  Past Medical History:  Diagnosis Date  . Arthritis   . Asthma   . Cancer ([place])    skin  . Depression   . DVT (deep venous thrombosis) ([place])   . Hyperlipidemia   . Hypertension   . Phlebitis     Past Surgical History:  Procedure Laterality Date  . BREAST CYST ASPIRATION  Left 20+ yrs ago  . CARDIOVERSION N/A 11/11/2018   Procedure: CARDIOVERSION;  Surgeon: Nelva Bush, MD;  Location: [facility] ORS;  Service: Cardiovascular;  Laterality: N/A;  . CATARACT EXTRACTION Bilateral [id number]  . cyst removal-right shoulder  1972  . otosclerosis Left 1988   hardening of bone, other 1989 redone  . THYROIDECTOMY  1970   nodule and 1/2 bridge removal  . WRIST SURGERY Left 11/29/2009     Current Outpatient Medications  Medication Sig Dispense Refill  . albuterol (VENTOLIN HFA) 108 (90 Base) MCG/ACT inhaler Inhale 2 puffs into the lungs every 6 (six) hours as needed for wheezing or shortness of breath. 18 g 3  . apixaban (ELIQUIS) 2.5 MG TABS tablet Take 1 tablet (2.5 mg total) by mouth 2 (two) times daily. 180 tablet 0  . Blood Glucose Monitoring Suppl (FREESTYLE FREEDOM LITE) w/Device KIT 1 kit by Does not apply route daily. 1 each 0  . denosumab (PROLIA) 60 MG/ML SOSY injection Inject 60 mg into the skin every 6 (six) months.    . diltiazem (CARDIZEM CD) 120 MG 24 hr capsule TAKE 1 CAPSULE BY MOUTH EVERY DAY 90 capsule 2  . Fluocinolone Acetonide 0.01 % OIL Place 4 drops in ear(s) at bedtime as needed.     . fluticasone (FLONASE) 50 MCG/ACT nasal spray PLACE 1 SPRAY INTO BOTH NOSTRILS DAILY. 48 mL 2  . fluticasone (FLOVENT HFA) 110 MCG/ACT inhaler Inhale 2 puffs into the lungs 2 (two) times daily. 3 Inhaler 3  . furosemide (LASIX) 40 MG tablet  Take 1 tablet (40 mg) by mouth once daily as needed for weight gain, increased shortness of breath, or swelling    . glucose blood (FREESTYLE LITE) test strip Use as directed once a daily Diag E11.65 100 each 12  . ipratropium-albuterol (DUONEB) 0.5-2.5 (3) MG/3ML SOLN USE 1 VIAL 3 TIMES DAILY AS NEEDED FOR SHORTNESS OF BREATH 810 mL 0  . Lancets (FREESTYLE) lancets Use as directed once daily diag e11.65 100 each 12  . levothyroxine (SYNTHROID) 25 MCG tablet TAKE 1 DAILY BY MOUTH IN THE MORNING ON AN EMPTY STOMACH 90 tablet 1  .  metoprolol tartrate (LOPRESSOR) 100 MG tablet TAKE 1 TABLETS BY MOUTH TWICE DAILY 180 tablet 2  . metoprolol tartrate (LOPRESSOR) 25 MG tablet Take 1 tablet (25 mg total) by mouth daily as needed (for a fib). 30 tablet 0  . montelukast (SINGULAIR) 10 MG tablet TAKE 1 TABLET BY MOUTH EVERY DAY FOR COUGH 90 tablet 3  . Multiple Vitamins-Minerals (MULTIVITAMIN WITH MINERALS) tablet Take 1 tablet by mouth daily.    . OXYGEN Inhale 2 L into the lungs every evening. Sleep with nasal cannula w/ O2 at 2lpm.    . potassium chloride (KLOR-CON M10) 10 MEQ tablet TAKE 1 TABLET BY MOUTH EVERY DAY PRN ON DAYS WHEN TAKING LASIX 90 tablet 0  . Respiratory Therapy Supplies (ONE FLOW SPIROMETER) DEVI Please use device as tolerated and according to instruction provided. 1 Device 0  . rosuvastatin (CRESTOR) 5 MG tablet Take 1 tablet (5 mg total) by mouth daily at 6 PM. 90 tablet 2  . sertraline (ZOLOFT) 100 MG tablet Take 1 tablet (100 mg total) by mouth daily. 90 tablet 2  . vitamin C (ASCORBIC ACID) 500 MG tablet Take 500 mg by mouth daily.     No current facility-administered medications for this visit.    Allergies:   Hydralazine, Biaxin [clarithromycin], Ciprofloxacin, Meloxicam, and Minocycline    Social History:  The patient  reports that she has never smoked. She has never used smokeless tobacco. She reports that she does not drink alcohol or use drugs.   Family History:  The patient's family history includes Depression in her father; Osteoarthritis in her mother.    ROS:  Please see the history of present illness.   Otherwise, review of systems are positive for none.   All other systems are reviewed and negative.    PHYSICAL EXAM: VS:  BP 138/70 (BP Location: Right Arm, Patient Position: Sitting, Cuff Size: Normal)   Pulse 77   Ht _0  (1.6 m)   Wt 122 lb 8 oz (55.6 kg)   SpO2 98%   BMI 21.70 kg/m  , BMI Body mass index is 21.7 kg/m. GEN: Well nourished, well developed, in no acute distress    HEENT: normal  Neck: no JVD, carotid bruits, or masses Cardiac: Regular rate and rhythm; no murmurs, rubs, or gallops,no edema  Respiratory:  clear to auscultation bilaterally, normal work of breathing GI: soft, nontender, nondistended, + BS MS: no deformity or atrophy  Skin: warm and dry, no rash Neuro:  Strength and sensation are intact Psych: euthymic mood, full affect   EKG:  EKG is ordered today. The ekg ordered today demonstrates normal sinus rhythm with minimal LVH.  Motion artifact.  Borderline prolonged QT interval   Recent Labs: 11/10/2018: Magnesium 2.1 11/11/2018: B Natriuretic Peptide 283.0; TSH 4.231 11/26/2018: ALT 25; Hemoglobin 13.0; Platelets 255 12/10/2018: BUN 16; Creatinine, Ser 0.55; Potassium 4.3; Sodium 140  Lipid Panel    Component Value Date/Time   CHOL 79 11/11/2018 0317   CHOL 119 10/09/2018 0848   TRIG 34 11/11/2018 0317   HDL 30 (L) 11/11/2018 0317   HDL 38 (L) 10/09/2018 0848   CHOLHDL 2.6 11/11/2018 0317   VLDL 7 11/11/2018 0317   LDLCALC 42 11/11/2018 0317   LDLCALC 57 10/09/2018 0848      Wt Readings from Last 3 Encounters:  08/12/19 122 lb 8 oz (55.6 kg)  07/07/19 123 lb 12.8 oz (56.2 kg)  06/30/19 124 lb 3.2 oz (56.3 kg)      PAD Screen 10/29/2018  Previous PAD dx? No  Previous surgical procedure? No  Pain with walking? No  Feet/toe relief with dangling? No  Painful, non-healing ulcers? No  Extremities discolored? No      ASSESSMENT AND PLAN:  1.  Persistent atrial fibrillation/flutter: Amiodarone was discontinued in January due to concerns about lung toxicity.  She is maintaining in sinus rhythm with metoprolol and diltiazem.  The majority of this visit was to discuss the risks and benefits of continued anticoagulation given that she had 2 falls in the last month with significant physical injuries.  I spoke with the patient and her 2 daughters.  We agreed to continue anticoagulation for now with careful monitoring of her  mobility and balance.  They are concerned about the possibility of needing cardioversion if she goes into atrial fibrillation again given that her A. fib was very difficult to treat last year and was associated with significant heart failure.   2.  Essential hypertension: Blood pressure is controlled  3.  Chronic diastolic heart failure she appears to be euvolemic.  This was only in the setting of atrial fibrillation.  She has not required furosemide lately although she does have it at home to be used as needed.    Disposition:   FU with me in 3 month.  Signed,  Kathlyn Sacramento, MD  08/12/2019 11:38 AM    [place]

## 2019-08-23 ENCOUNTER — Other Ambulatory Visit: Payer: Self-pay

## 2019-08-23 ENCOUNTER — Telehealth: Payer: Self-pay

## 2019-08-23 ENCOUNTER — Other Ambulatory Visit: Payer: Self-pay | Admitting: Internal Medicine

## 2019-08-23 MED ORDER — HYDROCORTISONE ACETATE 25 MG RE SUPP: RECTAL | 0 refills | Status: DC

## 2019-08-23 NOTE — Telephone Encounter (Signed)
Pt daughter we send med

## 2019-08-26 ENCOUNTER — Telehealth: Payer: Self-pay

## 2019-08-26 NOTE — Telephone Encounter (Signed)
CMN for oxygen signed and placed in [facility] Patient folder.

## 2019-08-31 ENCOUNTER — Telehealth: Payer: Self-pay | Admitting: Cardiovascular Disease

## 2019-08-31 NOTE — Telephone Encounter (Signed)
Current VS   123/57 BP 70 HR 98% o2 97.0 Temp

## 2019-08-31 NOTE — Telephone Encounter (Signed)
Pt c/o medication issue:  1. Name of Medication: Diltiazem   2. How are you currently taking this medication (dosage and times per day)?   120 mg po q d   3. Are you having a reaction (difficulty breathing--STAT)? no  4. What is your medication issue? Patient took meds at 8 am and had 1 episode while having a BM of vomiting.  Patient daughter concerned about how much she may have absorbed into system before episode.  Please call to advise about today's dose.

## 2019-08-31 NOTE — Telephone Encounter (Signed)
DPR on file. Contacted the patient HCPOA her daughter [name] and was asked to call Ashley Weiss since she was currently with the patient. Spoke with the patients daughter Ashley Weiss. Evette Doffing that the patient vomited right after having a bowel movement this morning. This was 45 mins-1 hour after taking her morning medications.  The patient is asymptomatic. Patient VS provided below are with in normal range. Joetta Manners that the patient is to continue her regular medication regimen as prescribed.  Joetta Manners that she can recheck the patient VS later this afternoon to make sure that they are stable. If the patients BP or HR is elevated they can call the office for further advice.  Ashley Weiss verbalized understanding and voiced appreciation for the call back.

## 2019-09-07 ENCOUNTER — Encounter: Payer: Self-pay | Admitting: Internal Medicine

## 2019-09-07 ENCOUNTER — Other Ambulatory Visit: Payer: Self-pay

## 2019-09-07 ENCOUNTER — Ambulatory Visit (INDEPENDENT_AMBULATORY_CARE_PROVIDER_SITE_OTHER): Payer: Medicare Other | Admitting: Internal Medicine

## 2019-09-07 DIAGNOSIS — K640 First degree hemorrhoids: Secondary | ICD-10-CM | POA: Diagnosis not present

## 2019-09-07 DIAGNOSIS — J4520 Mild intermittent asthma, uncomplicated: Secondary | ICD-10-CM

## 2019-09-07 DIAGNOSIS — R0902 Hypoxemia: Secondary | ICD-10-CM | POA: Diagnosis not present

## 2019-09-07 DIAGNOSIS — I4821 Permanent atrial fibrillation: Secondary | ICD-10-CM

## 2019-09-07 NOTE — Progress Notes (Signed)
[facility] [place], [place] [address]  Internal MEDICINE  Telephone Visit  Patient Name: Ashley Weiss  [id number]  [id number]  Date of Service: 09/07/2019  I connected with the patient at 9:45 by video and verified the patients identity using two identifiers.   I discussed the limitations, risks, security and privacy concerns of performing an evaluation and management service by telephone and the availability of in person appointments. I also discussed with the patient that there may be a patient responsible charge related to the service.  The patient expressed understanding and agrees to proceed.    Chief Complaint  Patient presents with  . Telephone Screen    phone [phone]  . Telephone Assessment  . Depression  . Hyperlipidemia  . Hypertension    bp in the morning 166/81  . Follow-up    discuss Eliquis   . Hemorrhoids    HPI Pt is connected via video for routine follow up. She was seen by cardiology and was recommended to discontinue her Eliquis due to risk of fall. Pt is using her o2 at all times now and feels better She continues to have hemorrhoid flare up, however will continue on her treatment with suppository Doing well with asthma   Current Medication: Outpatient Encounter Medications as of 09/07/2019  Medication Sig  . albuterol (VENTOLIN HFA) 108 (90 Base) MCG/ACT inhaler Inhale 2 puffs into the lungs every 6 (six) hours as needed for wheezing or shortness of breath.  Marland Kitchen apixaban (ELIQUIS) 2.5 MG TABS tablet Take 1 tablet (2.5 mg total) by mouth 2 (two) times daily.  . Blood Glucose Monitoring Suppl (FREESTYLE FREEDOM LITE) w/Device KIT 1 kit by Does not apply route daily.  Marland Kitchen denosumab (PROLIA) 60 MG/ML SOSY injection Inject 60 mg into the skin every 6 (six) months.  . diltiazem (CARDIZEM CD) 120 MG 24 hr capsule TAKE 1 CAPSULE BY MOUTH EVERY DAY  . Fluocinolone Acetonide 0.01 % OIL Place 4 drops in ear(s) at bedtime as needed.   .  fluticasone (FLONASE) 50 MCG/ACT nasal spray PLACE 1 SPRAY INTO BOTH NOSTRILS DAILY.  . fluticasone (FLOVENT HFA) 110 MCG/ACT inhaler Inhale 2 puffs into the lungs 2 (two) times daily.  . furosemide (LASIX) 40 MG tablet Take 1 tablet (40 mg) by mouth once daily as needed for weight gain, increased shortness of breath, or swelling  . glucose blood (FREESTYLE LITE) test strip Use as directed once a daily Diag E11.65  . hydrocortisone (ANUSOL-HC) 25 MG suppository Insert one after EACH bm QD for 7 days and then prn  . ipratropium-albuterol (DUONEB) 0.5-2.5 (3) MG/3ML SOLN USE 1 VIAL 3 TIMES DAILY AS NEEDED FOR SHORTNESS OF BREATH  . Lancets (FREESTYLE) lancets Use as directed once daily diag e11.65  . levothyroxine (SYNTHROID) 25 MCG tablet TAKE 1 DAILY BY MOUTH IN THE MORNING ON AN EMPTY STOMACH  . metoprolol tartrate (LOPRESSOR) 100 MG tablet TAKE 1 TABLETS BY MOUTH TWICE DAILY  . metoprolol tartrate (LOPRESSOR) 25 MG tablet Take 1 tablet (25 mg total) by mouth daily as needed (for a fib).  . montelukast (SINGULAIR) 10 MG tablet TAKE 1 TABLET BY MOUTH EVERY DAY FOR COUGH  . Multiple Vitamins-Minerals (MULTIVITAMIN WITH MINERALS) tablet Take 1 tablet by mouth daily.  . OXYGEN Inhale 2 L into the lungs every evening. Sleep with nasal cannula w/ O2 at 2lpm.  . potassium chloride (KLOR-CON M10) 10 MEQ tablet TAKE 1 TABLET BY MOUTH EVERY DAY PRN ON DAYS WHEN  TAKING LASIX  . Respiratory Therapy Supplies (ONE FLOW SPIROMETER) DEVI Please use device as tolerated and according to instruction provided.  . rosuvastatin (CRESTOR) 5 MG tablet Take 1 tablet (5 mg total) by mouth daily at 6 PM.  . sertraline (ZOLOFT) 100 MG tablet Take 1 tablet (100 mg total) by mouth daily.  . vitamin C (ASCORBIC ACID) 500 MG tablet Take 500 mg by mouth daily.   No facility-administered encounter medications on file as of 09/07/2019.    Surgical History: Past Surgical History:  Procedure Laterality Date  . BREAST CYST  ASPIRATION Left 20+ yrs ago  . CARDIOVERSION N/A 11/11/2018   Procedure: CARDIOVERSION;  Surgeon: Nelva Bush, MD;  Location: [facility] ORS;  Service: Cardiovascular;  Laterality: N/A;  . CATARACT EXTRACTION Bilateral [id number]  . cyst removal-right shoulder  1972  . otosclerosis Left 1988   hardening of bone, other 1989 redone  . THYROIDECTOMY  1970   nodule and 1/2 bridge removal  . WRIST SURGERY Left 11/29/2009    Medical History: Past Medical History:  Diagnosis Date  . Arthritis   . Asthma   . Cancer ([place])    skin  . Depression   . DVT (deep venous thrombosis) ([place])   . Hyperlipidemia   . Hypertension   . Phlebitis     Family History: Family History  Problem Relation Age of Onset  . Osteoarthritis Mother   . Depression Father   . Breast cancer Neg Hx     Social History   Socioeconomic History  . Marital status: Widowed    Spouse name: Not on file  . Number of children: Not on file  . Years of education: Not on file  . Highest education level: Not on file  Occupational History  . Not on file  Tobacco Use  . Smoking status: Never Smoker  . Smokeless tobacco: Never Used  Substance and Sexual Activity  . Alcohol use: No  . Drug use: No  . Sexual activity: Never  Other Topics Concern  . Not on file  Social History Narrative  . Not on file   Social Determinants of Health   Financial Resource Strain:   . Difficulty of Paying Living Expenses:   Food Insecurity:   . Worried About Charity fundraiser in the Last Year:   . Arboriculturist in the Last Year:   Transportation Needs:   . Film/video editor (Medical):   Marland Kitchen Lack of Transportation (Non-Medical):   Physical Activity:   . Days of Exercise per Week:   . Minutes of Exercise per Session:   Stress:   . Feeling of Stress :   Social Connections:   . Frequency of Communication with Friends and Family:   . Frequency of Social Gatherings with Friends and Family:   . Attends Religious Services:   .  Active Member of Clubs or Organizations:   . Attends Archivist Meetings:   Marland Kitchen Marital Status:   Intimate Partner Violence:   . Fear of Current or Ex-Partner:   . Emotionally Abused:   Marland Kitchen Physically Abused:   . Sexually Abused:    Review of Systems  Constitutional: Negative.   HENT: Negative.   Respiratory: Negative for cough and choking.   Cardiovascular: Negative.  Negative for leg swelling.  Gastrointestinal: Positive for anal bleeding. Negative for rectal pain.  Musculoskeletal: Negative.   Neurological: Positive for weakness.       Fall  Psychiatric/Behavioral: Negative for decreased concentration.  Vital Signs: BP 124/63   Pulse 70   Temp (!) 97 F (36.1 C)   Ht 5' 2"  (1.575 m)   Wt 125 lb 12.8 oz (57.1 kg)   SpO2 99% Comment: 1 litre  BMI 23.01 kg/m   Observation/Objective: Pt seems comfortable, daughter Lelan Pons) with her as well  Assessment/Plan: 1. Permanent atrial fibrillation (HCC) Continue all meds, refer to cardiology for cessation of Eliquis due to risk of fall  2. Grade I hemorrhoids Stable for now   3. Hypoxia Continue home O2 at all times. Pt needs to have a letter for utility services for continuation of electricity at all times due to life saving device    4. Mild intermittent chronic asthma without complication Controlled   General Counseling: Courteny verbalizes understanding of the findings of today's phone visit and agrees with plan of treatment. I have discussed any further diagnostic evaluation that may be needed or ordered today. We also reviewed her medications today. she has been encouraged to call the office with any questions or concerns that should arise related to todays visit.   Time spent: 25Minutes  Dr Lavera Guise Internal medicine

## 2019-09-09 ENCOUNTER — Other Ambulatory Visit: Payer: Self-pay | Admitting: Cardiovascular Disease

## 2019-09-09 DIAGNOSIS — I4819 Other persistent atrial fibrillation: Secondary | ICD-10-CM

## 2019-09-13 MED ORDER — ASPIRIN EC 81 MG PO TBEC: 81.0000 mg | DELAYED_RELEASE_TABLET | Freq: Every day | ORAL | Status: DC

## 2019-09-13 NOTE — Telephone Encounter (Signed)
Daughter calling back to check status of mychart message

## 2019-09-21 ENCOUNTER — Telehealth: Payer: Self-pay

## 2019-09-21 NOTE — Telephone Encounter (Signed)
Confirmed appointment on 09/23/2019 and screened for cvoid. klh

## 2019-09-23 ENCOUNTER — Encounter: Payer: Self-pay | Admitting: Internal Medicine

## 2019-09-23 ENCOUNTER — Ambulatory Visit
Admission: RE | Admit: 2019-09-23 | Discharge: 2019-09-23 | Disposition: A | Discharge status: Home / Self Care | Payer: Medicare Other | Attending: Internal Medicine | Admitting: Internal Medicine

## 2019-09-23 ENCOUNTER — Ambulatory Visit
Admission: RE | Admit: 2019-09-23 | Discharge: 2019-09-23 | Disposition: A | Discharge status: Home / Self Care | Payer: Medicare Other | Source: Ambulatory Visit | Attending: Internal Medicine | Admitting: Internal Medicine

## 2019-09-23 ENCOUNTER — Ambulatory Visit (INDEPENDENT_AMBULATORY_CARE_PROVIDER_SITE_OTHER): Payer: Medicare Other | Admitting: Internal Medicine

## 2019-09-23 ENCOUNTER — Other Ambulatory Visit: Payer: Self-pay

## 2019-09-23 VITALS — BP 135/75 | HR 73 | Temp 97.6°F | Resp 16 | Ht 62.0 in | Wt 129.0 lb

## 2019-09-23 DIAGNOSIS — J4520 Mild intermittent asthma, uncomplicated: Secondary | ICD-10-CM

## 2019-09-23 DIAGNOSIS — I5030 Unspecified diastolic (congestive) heart failure: Secondary | ICD-10-CM

## 2019-09-23 DIAGNOSIS — J8410 Pulmonary fibrosis, unspecified: Secondary | ICD-10-CM

## 2019-09-23 DIAGNOSIS — R0602 Shortness of breath: Secondary | ICD-10-CM

## 2019-09-23 DIAGNOSIS — Z9981 Dependence on supplemental oxygen: Secondary | ICD-10-CM | POA: Diagnosis not present

## 2019-09-23 NOTE — Progress Notes (Signed)
[facility] [place], [place] [address]  Pulmonary Sleep Medicine   Office Visit Note  Patient Name: Ashley Weiss DOB: 1919-05-09 MRN [id number]  Date of Service: 09/23/2019  Complaints/HPI: Pulmonary fibrosis. Amiodarone toxicity now is breathing better. She states that she has noted no desaturations with activity. She is using her oxygen as prescribed.  States that it does seem to help her she is been doing little bit better.  Daughter was with her at the time of evaluation states that she has been steady.  Continues to follow-up with cardiology for her cardiac issues.  ROS  General: (-) fever, (-) chills, (-) night sweats, (-) weakness Skin: (-) rashes, (-) itching,. Eyes: (-) visual changes, (-) redness, (-) itching. Nose and Sinuses: (-) nasal stuffiness or itchiness, (-) postnasal drip, (-) nosebleeds, (-) sinus trouble. Mouth and Throat: (-) sore throat, (-) hoarseness. Neck: (-) swollen glands, (-) enlarged thyroid, (-) neck pain. Respiratory: - cough, (-) bloody sputum, + shortness of breath, - wheezing. Cardiovascular: - ankle swelling, (-) chest pain. Lymphatic: (-) lymph node enlargement. Neurologic: (-) numbness, (-) tingling. Psychiatric: (-) anxiety, (-) depression   Current Medication: Outpatient Encounter Medications as of 09/23/2019  Medication Sig  . albuterol (VENTOLIN HFA) 108 (90 Base) MCG/ACT inhaler Inhale 2 puffs into the lungs every 6 (six) hours as needed for wheezing or shortness of breath.  Marland Kitchen aspirin EC 81 MG tablet Take 1 tablet (81 mg total) by mouth daily.  . Blood Glucose Monitoring Suppl (FREESTYLE FREEDOM LITE) w/Device KIT 1 kit by Does not apply route daily.  Marland Kitchen denosumab (PROLIA) 60 MG/ML SOSY injection Inject 60 mg into the skin every 6 (six) months.  . diltiazem (CARDIZEM CD) 120 MG 24 hr capsule TAKE 1 CAPSULE BY MOUTH EVERY DAY  . Fluocinolone Acetonide 0.01 % OIL Place 4 drops in ear(s) at bedtime as needed.    . fluticasone (FLONASE) 50 MCG/ACT nasal spray PLACE 1 SPRAY INTO BOTH NOSTRILS DAILY.  . fluticasone (FLOVENT HFA) 110 MCG/ACT inhaler Inhale 2 puffs into the lungs 2 (two) times daily.  . furosemide (LASIX) 40 MG tablet Take 1 tablet (40 mg) by mouth once daily as needed for weight gain, increased shortness of breath, or swelling  . glucose blood (FREESTYLE LITE) test strip Use as directed once a daily Diag E11.65  . hydrocortisone (ANUSOL-HC) 25 MG suppository Insert one after EACH bm QD for 7 days and then prn  . ipratropium-albuterol (DUONEB) 0.5-2.5 (3) MG/3ML SOLN USE 1 VIAL 3 TIMES DAILY AS NEEDED FOR SHORTNESS OF BREATH  . Lancets (FREESTYLE) lancets Use as directed once daily diag e11.65  . levothyroxine (SYNTHROID) 25 MCG tablet TAKE 1 DAILY BY MOUTH IN THE MORNING ON AN EMPTY STOMACH  . metoprolol tartrate (LOPRESSOR) 100 MG tablet TAKE 1 TABLETS BY MOUTH TWICE DAILY  . metoprolol tartrate (LOPRESSOR) 25 MG tablet TAKE 1 TABLET (25 MG TOTAL) BY MOUTH DAILY AS NEEDED (FOR A FIB).  Marland Kitchen montelukast (SINGULAIR) 10 MG tablet TAKE 1 TABLET BY MOUTH EVERY DAY FOR COUGH  . Multiple Vitamins-Minerals (MULTIVITAMIN WITH MINERALS) tablet Take 1 tablet by mouth daily.  . OXYGEN Inhale 2 L into the lungs every evening. Sleep with nasal cannula w/ O2 at 2lpm.  . potassium chloride (KLOR-CON M10) 10 MEQ tablet TAKE 1 TABLET BY MOUTH EVERY DAY PRN ON DAYS WHEN TAKING LASIX  . Respiratory Therapy Supplies (ONE FLOW SPIROMETER) DEVI Please use device as tolerated and according to instruction provided.  Marland Kitchen  rosuvastatin (CRESTOR) 5 MG tablet Take 1 tablet (5 mg total) by mouth daily at 6 PM.  . sertraline (ZOLOFT) 100 MG tablet Take 1 tablet (100 mg total) by mouth daily.  . vitamin C (ASCORBIC ACID) 500 MG tablet Take 500 mg by mouth daily.  . [DISCONTINUED] albuterol (PROVENTIL HFA;VENTOLIN HFA) 108 (90 Base) MCG/ACT inhaler Inhale 2 puffs into the lungs every 6 (six) hours as needed for wheezing or  shortness of breath.  . [DISCONTINUED] apixaban (ELIQUIS) 2.5 MG TABS tablet Take 1 tablet (2.5 mg total) by mouth 2 (two) times daily.  . [DISCONTINUED] diltiazem (CARDIZEM CD) 120 MG 24 hr capsule TAKE 1 CAPSULE BY MOUTH EVERY DAY  . [DISCONTINUED] metoprolol tartrate (LOPRESSOR) 100 MG tablet Take 1 tablet (100 mg total) by mouth 2 (two) times daily.  . [DISCONTINUED] montelukast (SINGULAIR) 10 MG tablet TAKE 1 TABLET BY MOUTH EVERY DAY FOR COUGH  . [DISCONTINUED] rosuvastatin (CRESTOR) 5 MG tablet Take 1 tablet (5 mg total) by mouth daily at 6 PM.  . [DISCONTINUED] sertraline (ZOLOFT) 100 MG tablet Take 1 tablet (100 mg total) by mouth daily.   No facility-administered encounter medications on file as of 09/23/2019.    Surgical History: Past Surgical History:  Procedure Laterality Date  . BREAST CYST ASPIRATION Left 20+ yrs ago  . CARDIOVERSION N/A 11/11/2018   Procedure: CARDIOVERSION;  Surgeon: Nelva Bush, MD;  Location: [facility] ORS;  Service: Cardiovascular;  Laterality: N/A;  . CATARACT EXTRACTION Bilateral [id number]  . cyst removal-right shoulder  1972  . otosclerosis Left 1988   hardening of bone, other 1989 redone  . THYROIDECTOMY  1970   nodule and 1/2 bridge removal  . WRIST SURGERY Left 11/29/2009    Medical History: Past Medical History:  Diagnosis Date  . Arthritis   . Asthma   . Cancer ([place])    skin  . Depression   . DVT (deep venous thrombosis) ([place])   . Hyperlipidemia   . Hypertension   . Phlebitis     Family History: Family History  Problem Relation Age of Onset  . Osteoarthritis Mother   . Depression Father   . Breast cancer Neg Hx     Social History: Social History   Socioeconomic History  . Marital status: Widowed    Spouse name: Not on file  . Number of children: Not on file  . Years of education: Not on file  . Highest education level: Not on file  Occupational History  . Not on file  Tobacco Use  . Smoking status: Never Smoker  .  Smokeless tobacco: Never Used  Substance and Sexual Activity  . Alcohol use: No  . Drug use: No  . Sexual activity: Never  Other Topics Concern  . Not on file  Social History Narrative  . Not on file   Social Determinants of Health   Financial Resource Strain:   . Difficulty of Paying Living Expenses:   Food Insecurity:   . Worried About Charity fundraiser in the Last Year:   . Arboriculturist in the Last Year:   Transportation Needs:   . Film/video editor (Medical):   Marland Kitchen Lack of Transportation (Non-Medical):   Physical Activity:   . Days of Exercise per Week:   . Minutes of Exercise per Session:   Stress:   . Feeling of Stress :   Social Connections:   . Frequency of Communication with Friends and Family:   . Frequency of Social Gatherings  with Friends and Family:   . Attends Religious Services:   . Active Member of Clubs or Organizations:   . Attends Archivist Meetings:   Marland Kitchen Marital Status:   Intimate Partner Violence:   . Fear of Current or Ex-Partner:   . Emotionally Abused:   Marland Kitchen Physically Abused:   . Sexually Abused:     Vital Signs: Blood pressure 135/75, pulse 73, temperature 97.6 F (36.4 C), resp. rate 16, height _0  (1.575 m), weight 129 lb (58.5 kg), SpO2 91 %.  Examination: General Appearance: The patient is well-developed, well-nourished, and in no distress. Skin: Gross inspection of skin unremarkable. Head: normocephalic, no gross deformities. Eyes: no gross deformities noted. ENT: ears appear grossly normal no exudates. Neck: Supple. No thyromegaly. No LAD. Respiratory: few crackles noted. Cardiovascular: Normal S1 and S2 without murmur or rub. Extremities: No cyanosis. pulses are equal. Neurologic: Alert and oriented. No involuntary movements.  LABS: No results found for this or any previous visit (from the past 2160 hour(s)).  Radiology: DG Chest 2 View  Result Date: 04/28/2019 CLINICAL DATA:  86 year old female with recent  congestive heart failure. On home oxygen. EXAM: CHEST - 2 VIEW COMPARISON:  Chest radiographs 11/26/2018 and earlier. FINDINGS: Frontal and lateral views. Stable cardiomegaly and mediastinal contours. Chronically large lung volumes with diffuse increased pulmonary interstitial opacity since August. No superimposed pneumothorax or pleural effusion. No confluent pulmonary opacity. Chronic right rib fractures. Exaggerated thoracic kyphosis with osteopenia. No acute osseous abnormality identified. Negative visible bowel gas pattern. IMPRESSION: Cardiomegaly and large lung volumes with diffuse increased pulmonary interstitial opacity since August. No pleural fluid identified. Top differential considerations include pulmonary interstitial edema and viral/atypical respiratory infection. Electronically Signed   By: Genevie Ann M.D.   On: 04/28/2019 10:57    No results found.  No results found.    Assessment and Plan: Patient Active Problem List   Diagnosis Date Noted  . Acute heart failure with preserved ejection fraction (HFpEF) ([place])   . Shortness of breath 10/21/2018  . Atrial fibrillation with rapid ventricular response ([place]) 10/10/2018  . Atrial fibrillation with RVR ([place]) 10/10/2018  . Nocturnal hypoxemia due to asthma 10/29/2017  . Acute pain of right wrist 09/30/2017  . Hyperlipidemia 07/23/2017  . Osteoporosis, post-menopausal 07/03/2017  . Chronic obstructive pulmonary disease, unspecified ([place]) 05/28/2017  . Pain in left knee 05/28/2017  . Essential (primary) hypertension 05/28/2017  . Cardiac arrhythmia 05/28/2017  . Hypothyroidism 05/28/2017  . Injury of right hip 06/15/2015  . Mild intermittent asthma without complication 29/47/6546    1. Pulmonary fibrosis advanced disease plan is to continue with supportive care she has been taken off of the amiodarone so as to prevent any further deterioration of her pulmonary status. 2. Atrial fib now off amiodarone and also is off eliquis taking  81 mg ASA now 3. Oxygen dependent encouraged to continue using the oxygen as prescribed 4. Mild Asthma inhalers and nebulizers as discussed with the family at length 5. CHF preserved EF monitor fluid status she also does follow with cardiology  General Counseling: I have discussed the findings of the evaluation and examination with [place].  I have also discussed any further diagnostic evaluation thatmay be needed or ordered today. Latrell verbalizes understanding of the findings of todays visit. We also reviewed her medications today and discussed drug interactions and side effects including but not limited excessive drowsiness and altered mental states. We also discussed that there is always a risk not just to  her but also people around her. she has been encouraged to call the office with any questions or concerns that should arise related to todays visit.  No orders of the defined types were placed in this encounter.    Time spent: 51mn  I have personally obtained a history, examined the patient, evaluated laboratory and imaging results, formulated the assessment and plan and placed orders.    SAllyne Gee MD FSt [facility] - [facility]ahPulmonary and Critical Care Sleep medicine

## 2019-09-23 NOTE — Patient Instructions (Signed)
Pulmonary Fibrosis  Pulmonary fibrosis is a type of lung disease that causes scarring. Over time, the scar tissue builds up in the air sacs of your lungs (alveoli). This makes it hard for you to breathe. Less oxygen can get into your blood. Scarring from pulmonary fibrosis gets worse over time. This damage is permanent and may lead to other serious health problems. What are the causes? There are many different causes of pulmonary fibrosis. Sometimes the cause is not known. This is called idiopathic pulmonary fibrosis. Other causes include:  Exposure to chemicals and substances found in agricultural, farm, construction, or factory work. These include mold, asbestos, silica, metal dusts, and toxic fumes.  Sarcoidosis. In this disease, areas of inflammatory cells (granulomas) form and most often affect the lungs.  Autoimmune diseases. These include diseases such as rheumatoid arthritis, systemic sclerosis, or connective tissue disease.  Taking certain medicines. These include drugs used in radiation therapy or used to treat seizures, heart problems, and some infections. What increases the risk? You are more likely to develop this condition if:  You have a family history of the disease.  You are older. The condition is more common in older adults.  You have a history of smoking.  You have a job that exposes you to certain chemicals.  You have gastroesophageal reflux disease (GERD). What are the signs or symptoms? Symptoms of this condition include:  Difficulty breathing that gets worse with activity.  Shortness of breath (dyspnea).  Dry, hacking cough.  Rapid, shallow breathing during exercise or while at rest.  Bluish skin and lips.  Loss of appetite.  Weakness.  Weight loss and fatigue.  Rounded and enlarged fingertips (clubbing). How is this diagnosed? This condition may be diagnosed based on:  Your symptoms and medical history.  A physical exam. You may also have  tests, including:  A test that involves looking inside your lungs with an instrument (bronchoscopy).  Imaging studies of your lungs and heart.  Tests to measure how well you are breathing (pulmonary function tests).  Blood tests.  Tests to see how well your lungs work while you are walking (pulmonary stress test).  A procedure to remove a lung tissue sample to look at it under a microscope (biopsy). How is this treated? There is no cure for pulmonary fibrosis. Treatment focuses on managing symptoms and preventing scarring from getting worse. This may include:  Medicines, such as: ? Steroids to prevent permanent lung changes. ? Medicines to suppress your body's defense system (immune system). ? Medicines to help with lung function by reducing inflammation or scarring.  Ongoing monitoring with X-rays and lab work.  Oxygen therapy.  Pulmonary rehabilitation.  Surgery. In some cases, a lung transplant is possible. Follow these instructions at home:     Medicines  Take over-the-counter and prescription medicines only as told by your health care provider.  Keep your vaccinations up to date as recommended by your health care provider. General instructions  Do not use any products that contain nicotine or tobacco, such as cigarettes and e-cigarettes. If you need help quitting, ask your health care provider.  Get regular exercise, but do not overexert yourself. Ask your health care provider to suggest some activities that are safe for you to do. ? If you have physical limitations, you may get exercise by walking, using a stationary bike, or doing chair exercises. ? Ask your health care provider about using oxygen while exercising.  If you are exposed to chemicals and substances at work,   make sure that you wear a mask or respirator at all times.  Join a pulmonary rehabilitation program or a support group for people with pulmonary fibrosis.  Eat small meals often so you do not  get too full. Overeating can make breathing trouble worse.  Maintain a healthy weight. Lose weight if you need to.  Do breathing exercises as directed by your health care provider.  Keep all follow-up visits as told by your health care provider. This is important. Contact a health care provider if you:  Have symptoms that do not get better with medicines.  Are not able to be as active as usual.  Have trouble taking a deep breath.  Have a fever or chills.  Have blue lips or skin.  Have clubbing of your fingers. Get help right away if you:  Have a sudden worsening of your symptoms.  Have chest pain.  Cough up mucus that is dark in color.  Have a lot of headaches.  Get very confused or sleepy. Summary  Pulmonary fibrosis is a type of lung disease that causes scar tissue to build up in the air sacs of your lungs (alveoli) over time. Less oxygen can get into your blood. This makes it hard for you to breathe.  Scarring from pulmonary fibrosis gets worse over time. This damage is permanent and may lead to other serious health problems.  You are more likely to develop this condition if you have a family history of the condition or a job that exposes you to certain chemicals.  There is no cure for pulmonary fibrosis. Treatment focuses on managing symptoms and preventing scarring from getting worse. This information is not intended to replace advice given to you by your health care provider. Make sure you discuss any questions you have with your health care provider. Document Revised: 05/14/2017 Document Reviewed: 05/14/2017 Elsevier Patient Education  2020 [employer] Inc.  

## 2019-09-29 ENCOUNTER — Telehealth: Payer: Self-pay

## 2019-09-29 NOTE — Telephone Encounter (Signed)
CMN FOR OXYGEN SIGNED AND PLACED IN [employer] PATIENT FOLDER.

## 2019-10-06 ENCOUNTER — Other Ambulatory Visit: Payer: Self-pay

## 2019-10-06 DIAGNOSIS — I4819 Other persistent atrial fibrillation: Secondary | ICD-10-CM

## 2019-10-06 MED ORDER — METOPROLOL TARTRATE 25 MG PO TABS: 25.0000 mg | ORAL_TABLET | Freq: Every day | ORAL | 0 refills | Status: DC | PRN

## 2019-10-18 ENCOUNTER — Other Ambulatory Visit: Payer: Self-pay | Admitting: Cardiovascular Disease

## 2019-10-18 DIAGNOSIS — I4819 Other persistent atrial fibrillation: Secondary | ICD-10-CM

## 2019-10-21 ENCOUNTER — Encounter: Payer: Self-pay | Admitting: Nurse Practitioner

## 2019-10-21 ENCOUNTER — Ambulatory Visit (INDEPENDENT_AMBULATORY_CARE_PROVIDER_SITE_OTHER): Payer: Medicare Other | Admitting: Nurse Practitioner

## 2019-10-21 VITALS — BP 140/65 | HR 62 | Temp 98.6°F | Resp 16 | Ht 62.0 in | Wt 127.4 lb

## 2019-10-21 DIAGNOSIS — B9689 Other specified bacterial agents as the cause of diseases classified elsewhere: Secondary | ICD-10-CM | POA: Diagnosis not present

## 2019-10-21 DIAGNOSIS — J019 Acute sinusitis, unspecified: Secondary | ICD-10-CM | POA: Diagnosis not present

## 2019-10-21 MED ORDER — LEVOFLOXACIN 250 MG PO TABS: 250.0000 mg | ORAL_TABLET | Freq: Every day | ORAL | 0 refills | Status: DC

## 2019-10-21 NOTE — Progress Notes (Signed)
[facility] [place], [place] [address]  Internal MEDICINE  Telephone Visit  Patient Name: Ashley Weiss  [id number]  [id number]  Date of Service: 10/30/2019  I connected with the patient at 5:18pm by telephone and verified the patients identity using two identifiers.   I discussed the limitations, risks, security and privacy concerns of performing an evaluation and management service by telephone and the availability of in person appointments. I also discussed with the patient that there may be a patient responsible charge related to the service.  The patient expressed understanding and agrees to proceed.    Chief Complaint  Patient presents with  . Telephone Screen    dizzy for a week, congestion   . Telephone Assessment  . Dizziness  . Sinusitis  . Cough    clear mucus   . Ear Pain    right     The patient has been contacted via telephone for follow up visit due to concerns for spread of novel coronavirus. The patient presents for acute visit.  She saw her ear doctor, suggesting she put mineral oil in the ear due to problem to inner ear. This is now progressing to severe dizziness. She has developed cough with chest congestion, headaches, and post nasal drip. She denies fever. Mucus she is coughing up and blowing out of her noses is clear. Dizziness started last Wednesday. Other symptoms started earlier this week. They did contact the ear doctor who suggested symptoms be monitored.       Current Medication: Outpatient Encounter Medications as of 10/21/2019  Medication Sig  . albuterol (VENTOLIN HFA) 108 (90 Base) MCG/ACT inhaler Inhale 2 puffs into the lungs every 6 (six) hours as needed for wheezing or shortness of breath.  Marland Kitchen aspirin EC 81 MG tablet Take 1 tablet (81 mg total) by mouth daily.  . Blood Glucose Monitoring Suppl (FREESTYLE FREEDOM LITE) w/Device KIT 1 kit by Does not apply route daily.  Marland Kitchen denosumab (PROLIA) 60 MG/ML SOSY injection Inject  60 mg into the skin every 6 (six) months.  . diltiazem (CARDIZEM CD) 120 MG 24 hr capsule TAKE 1 CAPSULE BY MOUTH EVERY DAY  . Fluocinolone Acetonide 0.01 % OIL Place 4 drops in ear(s) at bedtime as needed.   . fluticasone (FLONASE) 50 MCG/ACT nasal spray PLACE 1 SPRAY INTO BOTH NOSTRILS DAILY.  . fluticasone (FLOVENT HFA) 110 MCG/ACT inhaler Inhale 2 puffs into the lungs 2 (two) times daily.  . furosemide (LASIX) 40 MG tablet Take 1 tablet (40 mg) by mouth once daily as needed for weight gain, increased shortness of breath, or swelling  . glucose blood (FREESTYLE LITE) test strip Use as directed once a daily Diag E11.65  . hydrocortisone (ANUSOL-HC) 25 MG suppository Insert one after EACH bm QD for 7 days and then prn  . ipratropium-albuterol (DUONEB) 0.5-2.5 (3) MG/3ML SOLN USE 1 VIAL 3 TIMES DAILY AS NEEDED FOR SHORTNESS OF BREATH  . Lancets (FREESTYLE) lancets Use as directed once daily diag e11.65  . metoprolol tartrate (LOPRESSOR) 100 MG tablet TAKE 1 TABLETS BY MOUTH TWICE DAILY  . metoprolol tartrate (LOPRESSOR) 25 MG tablet TAKE 1 TABLET (25 MG TOTAL) BY MOUTH DAILY AS NEEDED (FOR A FIB).  Marland Kitchen montelukast (SINGULAIR) 10 MG tablet TAKE 1 TABLET BY MOUTH EVERY DAY FOR COUGH  . Multiple Vitamins-Minerals (MULTIVITAMIN WITH MINERALS) tablet Take 1 tablet by mouth daily.  . OXYGEN Inhale 2 L into the lungs every evening. Sleep with nasal cannula w/  O2 at 2lpm.  . potassium chloride (KLOR-CON M10) 10 MEQ tablet TAKE 1 TABLET BY MOUTH EVERY DAY PRN ON DAYS WHEN TAKING LASIX  . Respiratory Therapy Supplies (ONE FLOW SPIROMETER) DEVI Please use device as tolerated and according to instruction provided.  . rosuvastatin (CRESTOR) 5 MG tablet Take 1 tablet (5 mg total) by mouth daily at 6 PM.  . sertraline (ZOLOFT) 100 MG tablet Take 1 tablet (100 mg total) by mouth daily.  . vitamin C (ASCORBIC ACID) 500 MG tablet Take 500 mg by mouth daily.  . [DISCONTINUED] levothyroxine (SYNTHROID) 25 MCG tablet  TAKE 1 DAILY BY MOUTH IN THE MORNING ON AN EMPTY STOMACH  . levofloxacin (LEVAQUIN) 250 MG tablet Take 1 tablet (250 mg total) by mouth daily.   No facility-administered encounter medications on file as of 10/21/2019.    Surgical History: Past Surgical History:  Procedure Laterality Date  . BREAST CYST ASPIRATION Left 20+ yrs ago  . CARDIOVERSION N/A 11/11/2018   Procedure: CARDIOVERSION;  Surgeon: Nelva Bush, MD;  Location: [facility] ORS;  Service: Cardiovascular;  Laterality: N/A;  . CATARACT EXTRACTION Bilateral [id number]  . cyst removal-right shoulder  1972  . otosclerosis Left 1988   hardening of bone, other 1989 redone  . THYROIDECTOMY  1970   nodule and 1/2 bridge removal  . WRIST SURGERY Left 11/29/2009    Medical History: Past Medical History:  Diagnosis Date  . Arthritis   . Asthma   . Cancer ([place])    skin  . Depression   . DVT (deep venous thrombosis) ([place])   . Hyperlipidemia   . Hypertension   . Phlebitis     Family History: Family History  Problem Relation Age of Onset  . Osteoarthritis Mother   . Depression Father   . Breast cancer Neg Hx     Social History   Socioeconomic History  . Marital status: Widowed    Spouse name: Not on file  . Number of children: Not on file  . Years of education: Not on file  . Highest education level: Not on file  Occupational History  . Not on file  Tobacco Use  . Smoking status: Never Smoker  . Smokeless tobacco: Never Used  Vaping Use  . Vaping Use: Never used  Substance and Sexual Activity  . Alcohol use: No  . Drug use: No  . Sexual activity: Never  Other Topics Concern  . Not on file  Social History Narrative  . Not on file   Social Determinants of Health   Financial Resource Strain:   . Difficulty of Paying Living Expenses:   Food Insecurity:   . Worried About Charity fundraiser in the Last Year:   . Arboriculturist in the Last Year:   Transportation Needs:   . Film/video editor  (Medical):   Marland Kitchen Lack of Transportation (Non-Medical):   Physical Activity:   . Days of Exercise per Week:   . Minutes of Exercise per Session:   Stress:   . Feeling of Stress :   Social Connections:   . Frequency of Communication with Friends and Family:   . Frequency of Social Gatherings with Friends and Family:   . Attends Religious Services:   . Active Member of Clubs or Organizations:   . Attends Archivist Meetings:   Marland Kitchen Marital Status:   Intimate Partner Violence:   . Fear of Current or Ex-Partner:   . Emotionally Abused:   Marland Kitchen Physically Abused:   .  Sexually Abused:       Review of Systems  Constitutional: Positive for fatigue. Negative for chills, fever and unexpected weight change.  HENT: Positive for congestion, ear pain and postnasal drip. Negative for rhinorrhea, sneezing and sore throat.   Respiratory: Negative for cough, chest tightness, shortness of breath and wheezing.   Cardiovascular: Negative for chest pain and palpitations.  Gastrointestinal: Negative for abdominal pain, constipation, diarrhea, nausea and vomiting.  Musculoskeletal: Negative for arthralgias, back pain, joint swelling and neck pain.  Skin: Negative for rash.  Neurological: Positive for dizziness. Negative for tremors and numbness.  Hematological: Negative for adenopathy. Does not bruise/bleed easily.  Psychiatric/Behavioral: Negative for behavioral problems (Depression), sleep disturbance and suicidal ideas. The patient is not nervous/anxious.     Today's Vitals   10/21/19 1627  BP: 140/65  Pulse: 62  Resp: 16  Temp: 98.6 F (37 C)  SpO2: 98%  Weight: 127 lb 6.4 oz (57.8 kg)  Height: _0  (1.575 m)   Body mass index is 23.3 kg/m.  Observation/Objective:    The patient is alert and oriented. She is pleasant and answers all questions appropriately. Breathing is non-labored. She is in no acute distress at this time.    Assessment/Plan: 1. Acute bacterial  sinusitis Started levofloxacin 286m daily for 10 days. Encouraged patient to rest and increase fluids. Advised patient and family member to contact ENT provider for persistent symptoms.  - levofloxacin (LEVAQUIN) 250 MG tablet; Take 1 tablet (250 mg total) by mouth daily.  Dispense: 10 tablet; Refill: 0  General Counseling: Simisola verbalizes understanding of the findings of today's phone visit and agrees with plan of treatment. I have discussed any further diagnostic evaluation that may be needed or ordered today. We also reviewed her medications today. she has been encouraged to call the office with any questions or concerns that should arise related to todays visit.   This patient was [facility] F[facility]vera Guiseas a part of collaborative care agreement  Meds ordered this encounter  Medications  . levofloxacin (LEVAQUIN) 250 MG tablet    Sig: Take 1 tablet (250 mg total) by mouth daily.    Dispense:  10 tablet    Refill:  0    Verified with patient and her daughter that patient is able to take levofloxacin without allergic reaction.    Order Specific Question:   Supervising Provider    Answer:   KLavera Guise[[[id number]   Time spent: 236Minutes    Dr FLavera GuiseInternal medicine

## 2019-10-28 ENCOUNTER — Other Ambulatory Visit: Payer: Self-pay

## 2019-10-28 MED ORDER — LEVOTHYROXINE SODIUM 25 MCG PO TABS: ORAL_TABLET | ORAL | 1 refills | Status: DC

## 2019-10-29 ENCOUNTER — Ambulatory Visit: Payer: Medicare Other | Admitting: Nurse Practitioner

## 2019-10-30 DIAGNOSIS — J019 Acute sinusitis, unspecified: Secondary | ICD-10-CM | POA: Insufficient documentation

## 2019-11-08 ENCOUNTER — Other Ambulatory Visit: Payer: Self-pay

## 2019-11-08 MED ORDER — IPRATROPIUM-ALBUTEROL 0.5-2.5 (3) MG/3ML IN SOLN: RESPIRATORY_TRACT | 0 refills | Status: DC

## 2019-11-09 ENCOUNTER — Other Ambulatory Visit: Payer: Self-pay

## 2019-11-09 MED ORDER — IPRATROPIUM-ALBUTEROL 0.5-2.5 (3) MG/3ML IN SOLN: RESPIRATORY_TRACT | 1 refills | Status: DC

## 2019-11-18 ENCOUNTER — Encounter: Payer: Self-pay | Admitting: Cardiovascular Disease

## 2019-11-18 ENCOUNTER — Ambulatory Visit (INDEPENDENT_AMBULATORY_CARE_PROVIDER_SITE_OTHER): Payer: Medicare Other | Admitting: Cardiovascular Disease

## 2019-11-18 ENCOUNTER — Other Ambulatory Visit: Payer: Self-pay

## 2019-11-18 VITALS — BP 128/62 | HR 63 | Ht 62.0 in | Wt 128.4 lb

## 2019-11-18 DIAGNOSIS — I4819 Other persistent atrial fibrillation: Secondary | ICD-10-CM | POA: Diagnosis not present

## 2019-11-18 DIAGNOSIS — I10 Essential (primary) hypertension: Secondary | ICD-10-CM | POA: Diagnosis not present

## 2019-11-18 DIAGNOSIS — I5032 Chronic diastolic (congestive) heart failure: Secondary | ICD-10-CM | POA: Diagnosis not present

## 2019-11-18 NOTE — Progress Notes (Signed)
Cardiology Office Note   Date:  11/18/2019   ID:  Ashley Weiss, DOB [birth year], MRN [id number]  PCP:  Lavera Guise, MD  Cardiologist:   Kathlyn Sacramento, MD   Chief Complaint  Patient presents with  . Other    3 month follow up. Meds reviewed by the pt. verbally. Pt. c/o shortness of breath.       History of Present Illness: Ashley Weiss is a 86 y.o. female who is here today for follow-up visit regarding persistent atrial fibrillation status post cardioversion  and chronic diastolic heart failure.  She has history of hyperlipidemia, essential hypertension, hyperlipidemia and persistent atrial fibrillation.   Rate control was very difficult  and ultimately she underwent successful cardioversion in July, 2020 after starting amiodarone. Echocardiogram in July,2020 showed an EF of 55 to 60% with moderately dilated left atrium, moderate pulmonary hypertension and moderate tricuspid regurgitation. Amiodarone was stopped in January due to concerns about lung toxicity requiring oxygen therapy.  Chest x-ray showed interstitial lung disease. Pulmonary function testing showed severe restrictive lung disease with severely decreased DLCO. She had recurrent falls resulting in physical injuries and thus it was decided to stop anticoagulation.  She is doing well from a cardiac standpoint with no recurrent palpitations.  No chest pain.  She has chronic exertional dyspnea with no recent worsening.  She is on home oxygen at 1 L/min.   Past Medical History:  Diagnosis Date  . Arthritis   . Asthma   . Cancer ([place])    skin  . Depression   . DVT (deep venous thrombosis) ([place])   . Hyperlipidemia   . Hypertension   . Phlebitis     Past Surgical History:  Procedure Laterality Date  . BREAST CYST ASPIRATION Left 20+ yrs ago  . CARDIOVERSION N/A 11/11/2018   Procedure: CARDIOVERSION;  Surgeon: Nelva Bush, MD;  Location: [facility] ORS;  Service: Cardiovascular;  Laterality: N/A;  . CATARACT  EXTRACTION Bilateral [id number]  . cyst removal-right shoulder  1972  . otosclerosis Left 1988   hardening of bone, other 1989 redone  . THYROIDECTOMY  1970   nodule and 1/2 bridge removal  . WRIST SURGERY Left 11/29/2009     Current Outpatient Medications  Medication Sig Dispense Refill  . albuterol (VENTOLIN HFA) 108 (90 Base) MCG/ACT inhaler Inhale 2 puffs into the lungs every 6 (six) hours as needed for wheezing or shortness of breath. 18 g 3  . aspirin EC 81 MG tablet Take 1 tablet (81 mg total) by mouth daily.    . Blood Glucose Monitoring Suppl (FREESTYLE FREEDOM LITE) w/Device KIT 1 kit by Does not apply route daily. 1 each 0  . denosumab (PROLIA) 60 MG/ML SOSY injection Inject 60 mg into the skin every 6 (six) months.    . diltiazem (CARDIZEM CD) 120 MG 24 hr capsule TAKE 1 CAPSULE BY MOUTH EVERY DAY 90 capsule 2  . Fluocinolone Acetonide 0.01 % OIL Place 4 drops in ear(s) at bedtime as needed.     . fluticasone (FLONASE) 50 MCG/ACT nasal spray PLACE 1 SPRAY INTO BOTH NOSTRILS DAILY. 48 mL 2  . fluticasone (FLOVENT HFA) 110 MCG/ACT inhaler Inhale 2 puffs into the lungs 2 (two) times daily. 3 Inhaler 3  . furosemide (LASIX) 40 MG tablet Take 1 tablet (40 mg) by mouth once daily as needed for weight gain, increased shortness of breath, or swelling    . glucose blood (FREESTYLE LITE) test strip Use as directed once  a daily Diag E11.65 100 each 12  . hydrocortisone (ANUSOL-HC) 25 MG suppository Insert one after EACH bm QD for 7 days and then prn 25 suppository 0  . ipratropium-albuterol (DUONEB) 0.5-2.5 (3) MG/3ML SOLN USE 1 VIAL 3 TIMES DAILY AS NEEDED FOR SHORTNESS OF BREATH DX J44.9 810 mL 1  . Lancets (FREESTYLE) lancets Use as directed once daily diag e11.65 100 each 12  . levofloxacin (LEVAQUIN) 250 MG tablet Take 1 tablet (250 mg total) by mouth daily. 10 tablet 0  . levothyroxine (SYNTHROID) 25 MCG tablet Take 1 daily by mouth in the morning on a empty stomach 90 tablet 1  .  metoprolol tartrate (LOPRESSOR) 100 MG tablet TAKE 1 TABLETS BY MOUTH TWICE DAILY 180 tablet 2  . metoprolol tartrate (LOPRESSOR) 25 MG tablet TAKE 1 TABLET (25 MG TOTAL) BY MOUTH DAILY AS NEEDED (FOR A FIB). 90 tablet 0  . montelukast (SINGULAIR) 10 MG tablet TAKE 1 TABLET BY MOUTH EVERY DAY FOR COUGH 90 tablet 3  . Multiple Vitamins-Minerals (MULTIVITAMIN WITH MINERALS) tablet Take 1 tablet by mouth daily.    . OXYGEN Inhale 2 L into the lungs every evening. Sleep with nasal cannula w/ O2 at 2lpm.    . potassium chloride (KLOR-CON M10) 10 MEQ tablet TAKE 1 TABLET BY MOUTH EVERY DAY PRN ON DAYS WHEN TAKING LASIX 90 tablet 0  . Respiratory Therapy Supplies (ONE FLOW SPIROMETER) DEVI Please use device as tolerated and according to instruction provided. 1 Device 0  . rosuvastatin (CRESTOR) 5 MG tablet Take 1 tablet (5 mg total) by mouth daily at 6 PM. 90 tablet 2  . sertraline (ZOLOFT) 100 MG tablet Take 1 tablet (100 mg total) by mouth daily. 90 tablet 2  . vitamin C (ASCORBIC ACID) 500 MG tablet Take 500 mg by mouth daily.    Marland Kitchen ipratropium (ATROVENT) 0.02 % nebulizer solution Inhale into the lungs.     No current facility-administered medications for this visit.    Allergies:   Hydralazine, Biaxin [clarithromycin], Ciprofloxacin, Meloxicam, and Minocycline    Social History:  The patient  reports that she has never smoked. She has never used smokeless tobacco. She reports that she does not drink alcohol and does not use drugs.   Family History:  The patient's family history includes Depression in her father; Osteoarthritis in her mother.    ROS:  Please see the history of present illness.   Otherwise, review of systems are positive for none.   All other systems are reviewed and negative.    PHYSICAL EXAM: VS:  BP (!) 128/62 (BP Location: Left Arm, Patient Position: Sitting, Cuff Size: Normal)   Ht _0  (1.575 m)   Wt 128 lb 6 oz (58.2 kg)   SpO2 97%   BMI 23.48 kg/m  , BMI Body mass  index is 23.48 kg/m. GEN: Well nourished, well developed, in no acute distress  HEENT: normal  Neck: no JVD, carotid bruits, or masses Cardiac: Regular rate and rhythm; no murmurs, rubs, or gallops,no edema  Respiratory: Dry crackles consistent with pulmonary fibrosis. GI: soft, nontender, nondistended, + BS MS: no deformity or atrophy  Skin: warm and dry, no rash Neuro:  Strength and sensation are intact Psych: euthymic mood, full affect   EKG:  EKG is ordered today. The ekg ordered today demonstrates normal sinus rhythm with minimal LVH.  Recent Labs: 11/26/2018: ALT 25; Hemoglobin 13.0; Platelets 255 12/10/2018: BUN 16; Creatinine, Ser 0.55; Potassium 4.3; Sodium 140  Lipid Panel    Component Value Date/Time   CHOL 79 11/11/2018 0317   CHOL 119 10/09/2018 0848   TRIG 34 11/11/2018 0317   HDL 30 (L) 11/11/2018 0317   HDL 38 (L) 10/09/2018 0848   CHOLHDL 2.6 11/11/2018 0317   VLDL 7 11/11/2018 0317   LDLCALC 42 11/11/2018 0317   LDLCALC 57 10/09/2018 0848      Wt Readings from Last 3 Encounters:  11/18/19 128 lb 6 oz (58.2 kg)  10/21/19 127 lb 6.4 oz (57.8 kg)  09/23/19 129 lb (58.5 kg)      PAD Screen 10/29/2018  Previous PAD dx? No  Previous surgical procedure? No  Pain with walking? No  Feet/toe relief with dangling? No  Painful, non-healing ulcers? No  Extremities discolored? No      ASSESSMENT AND PLAN:  1.  Persistent atrial fibrillation/flutter: Amiodarone was discontinued in January due to concerns about lung toxicity.  She is maintaining in sinus rhythm with metoprolol and diltiazem.  She is no longer on anticoagulation due to recurrent falls.    2.  Essential hypertension: Blood pressure is controlled  3.  Chronic diastolic heart failure she appears to be euvolemic.  This was only in the setting of atrial fibrillation.  She has not required furosemide lately although she does have it at home to be used as needed.    Disposition:   FU with me in  6 month.  Signed,  Kathlyn Sacramento, MD  11/18/2019 11:46 AM    [place]

## 2019-11-18 NOTE — Patient Instructions (Signed)

## 2019-11-23 ENCOUNTER — Other Ambulatory Visit: Payer: Self-pay | Admitting: Adult Health

## 2019-11-26 DIAGNOSIS — H8112 Benign paroxysmal vertigo, left ear: Secondary | ICD-10-CM | POA: Diagnosis not present

## 2019-11-26 DIAGNOSIS — H6062 Unspecified chronic otitis externa, left ear: Secondary | ICD-10-CM | POA: Diagnosis not present

## 2019-11-26 DIAGNOSIS — H6123 Impacted cerumen, bilateral: Secondary | ICD-10-CM | POA: Diagnosis not present

## 2019-11-29 NOTE — Addendum Note (Signed)
Addended by: Anselm Pancoast on: 11/29/2019 12:20 PM   Modules accepted: Orders

## 2019-11-30 ENCOUNTER — Other Ambulatory Visit: Payer: Self-pay | Admitting: Physician Assistant

## 2019-11-30 ENCOUNTER — Encounter: Payer: Self-pay | Admitting: Hospice and Palliative Medicine

## 2019-11-30 ENCOUNTER — Ambulatory Visit (INDEPENDENT_AMBULATORY_CARE_PROVIDER_SITE_OTHER): Payer: Medicare Other | Admitting: Hospice and Palliative Medicine

## 2019-11-30 DIAGNOSIS — R0602 Shortness of breath: Secondary | ICD-10-CM

## 2019-11-30 DIAGNOSIS — B9689 Other specified bacterial agents as the cause of diseases classified elsewhere: Secondary | ICD-10-CM

## 2019-11-30 DIAGNOSIS — I4821 Permanent atrial fibrillation: Secondary | ICD-10-CM

## 2019-11-30 DIAGNOSIS — J019 Acute sinusitis, unspecified: Secondary | ICD-10-CM | POA: Diagnosis not present

## 2019-11-30 DIAGNOSIS — H8112 Benign paroxysmal vertigo, left ear: Secondary | ICD-10-CM | POA: Diagnosis not present

## 2019-11-30 DIAGNOSIS — R42 Dizziness and giddiness: Secondary | ICD-10-CM

## 2019-11-30 MED ORDER — AZITHROMYCIN 250 MG PO TABS: ORAL_TABLET | ORAL | 0 refills | Status: DC

## 2019-11-30 NOTE — Progress Notes (Signed)
[facility] [place], [place] [address]  Internal MEDICINE  Telephone Visit  Patient Name: Ashley Weiss  [id number]  [id number]  Date of Service: 12/02/2019  I connected with the patient at 11:38 am by video and verified the patients identity using two identifiers.   I discussed the limitations, risks, security and privacy concerns of performing an evaluation and management service by telephone and the availability of in person appointments. I also discussed with the patient that there may be a patient responsible charge related to the service.  The patient expressed understanding and agrees to proceed.    Chief Complaint  Patient presents with  . Telephone Screen    [phone]  . Telephone Assessment  . Dizziness  . Sinusitis  . Cough    yellow mucus  some and some clear  . Sore Throat    HPI Spoke with daughters on phone. They report Yaritzel developed a sore throat, cough and congestion over the weekend. They contacted our off hour line and were advised to begin Mucinex at that time. Symptoms per her daughter has worsened. She continues to have productive cough, shortness of breath, sore throat, sinus pressure and some dizziness. They report her cough is worse in the evening time. Denies fevers. They have been using her nebulizer treatments once daily to help with cough and shortness of breath. They are apprehensive to use nebulizer treatment more than once daily due to her history of atrial fibrillation.   Current Medication: Outpatient Encounter Medications as of 11/30/2019  Medication Sig  . albuterol (VENTOLIN HFA) 108 (90 Base) MCG/ACT inhaler Inhale 2 puffs into the lungs every 6 (six) hours as needed for wheezing or shortness of breath.  Marland Kitchen aspirin EC 81 MG tablet Take 1 tablet (81 mg total) by mouth daily.  . Blood Glucose Monitoring Suppl (FREESTYLE FREEDOM LITE) w/Device KIT 1 kit by Does not apply route daily.  Marland Kitchen denosumab (PROLIA) 60 MG/ML  SOSY injection Inject 60 mg into the skin every 6 (six) months.  . diltiazem (CARDIZEM CD) 120 MG 24 hr capsule TAKE 1 CAPSULE BY MOUTH EVERY DAY  . FLOVENT HFA 110 MCG/ACT inhaler TAKE 2 PUFFS BY MOUTH TWICE A DAY  . Fluocinolone Acetonide 0.01 % OIL Place 4 drops in ear(s) at bedtime as needed.   . fluticasone (FLONASE) 50 MCG/ACT nasal spray PLACE 1 SPRAY INTO BOTH NOSTRILS DAILY.  . furosemide (LASIX) 40 MG tablet Take 1 tablet (40 mg) by mouth once daily as needed for weight gain, increased shortness of breath, or swelling  . glucose blood (FREESTYLE LITE) test strip Use as directed once a daily Diag E11.65  . hydrocortisone (ANUSOL-HC) 25 MG suppository Insert one after EACH bm QD for 7 days and then prn  . ipratropium (ATROVENT) 0.02 % nebulizer solution Inhale into the lungs.  Marland Kitchen ipratropium-albuterol (DUONEB) 0.5-2.5 (3) MG/3ML SOLN USE 1 VIAL 3 TIMES DAILY AS NEEDED FOR SHORTNESS OF BREATH DX J44.9  . Lancets (FREESTYLE) lancets Use as directed once daily diag e11.65  . levofloxacin (LEVAQUIN) 250 MG tablet Take 1 tablet (250 mg total) by mouth daily.  Marland Kitchen levothyroxine (SYNTHROID) 25 MCG tablet Take 1 daily by mouth in the morning on a empty stomach  . metoprolol tartrate (LOPRESSOR) 100 MG tablet TAKE 1 TABLETS BY MOUTH TWICE DAILY  . metoprolol tartrate (LOPRESSOR) 25 MG tablet TAKE 1 TABLET (25 MG TOTAL) BY MOUTH DAILY AS NEEDED (FOR A FIB).  Marland Kitchen montelukast (SINGULAIR) 10 MG tablet  TAKE 1 TABLET BY MOUTH EVERY DAY FOR COUGH  . Multiple Vitamins-Minerals (MULTIVITAMIN WITH MINERALS) tablet Take 1 tablet by mouth daily.  . OXYGEN Inhale 2 L into the lungs every evening. Sleep with nasal cannula w/ O2 at 2lpm.  . potassium chloride (KLOR-CON M10) 10 MEQ tablet TAKE 1 TABLET BY MOUTH EVERY DAY PRN ON DAYS WHEN TAKING LASIX  . Respiratory Therapy Supplies (ONE FLOW SPIROMETER) DEVI Please use device as tolerated and according to instruction provided.  . rosuvastatin (CRESTOR) 5 MG tablet  Take 1 tablet (5 mg total) by mouth daily at 6 PM.  . sertraline (ZOLOFT) 100 MG tablet Take 1 tablet (100 mg total) by mouth daily.  . vitamin C (ASCORBIC ACID) 500 MG tablet Take 500 mg by mouth daily.  Marland Kitchen azithromycin (ZITHROMAX) 250 MG tablet Take one tab po qd for 7 days   No facility-administered encounter medications on file as of 11/30/2019.    Surgical History: Past Surgical History:  Procedure Laterality Date  . BREAST CYST ASPIRATION Left 20+ yrs ago  . CARDIOVERSION N/A 11/11/2018   Procedure: CARDIOVERSION;  Surgeon: Nelva Bush, MD;  Location: [facility] ORS;  Service: Cardiovascular;  Laterality: N/A;  . CATARACT EXTRACTION Bilateral [id number]  . cyst removal-right shoulder  1972  . otosclerosis Left 1988   hardening of bone, other 1989 redone  . THYROIDECTOMY  1970   nodule and 1/2 bridge removal  . WRIST SURGERY Left 11/29/2009    Medical History: Past Medical History:  Diagnosis Date  . Arthritis   . Asthma   . Cancer ([place])    skin  . Depression   . DVT (deep venous thrombosis) ([place])   . Hyperlipidemia   . Hypertension   . Phlebitis     Family History: Family History  Problem Relation Age of Onset  . Osteoarthritis Mother   . Depression Father   . Breast cancer Neg Hx     Social History   Socioeconomic History  . Marital status: Widowed    Spouse name: Not on file  . Number of children: Not on file  . Years of education: Not on file  . Highest education level: Not on file  Occupational History  . Not on file  Tobacco Use  . Smoking status: Never Smoker  . Smokeless tobacco: Never Used  Vaping Use  . Vaping Use: Never used  Substance and Sexual Activity  . Alcohol use: No  . Drug use: No  . Sexual activity: Never  Other Topics Concern  . Not on file  Social History Narrative  . Not on file   Social Determinants of Health   Financial Resource Strain:   . Difficulty of Paying Living Expenses:   Food Insecurity:   . Worried About  Charity fundraiser in the Last Year:   . Arboriculturist in the Last Year:   Transportation Needs:   . Film/video editor (Medical):   Marland Kitchen Lack of Transportation (Non-Medical):   Physical Activity:   . Days of Exercise per Week:   . Minutes of Exercise per Session:   Stress:   . Feeling of Stress :   Social Connections:   . Frequency of Communication with Friends and Family:   . Frequency of Social Gatherings with Friends and Family:   . Attends Religious Services:   . Active Member of Clubs or Organizations:   . Attends Archivist Meetings:   Marland Kitchen Marital Status:   Intimate Partner Violence:   .  Fear of Current or Ex-Partner:   . Emotionally Abused:   Marland Kitchen Physically Abused:   . Sexually Abused:     Review of Systems  Constitutional: Positive for fatigue. Negative for chills, diaphoresis and fever.  HENT: Positive for sinus pressure, sinus pain and sore throat. Negative for ear pain, postnasal drip and trouble swallowing.   Eyes: Negative for photophobia, discharge, redness, itching and visual disturbance.  Respiratory: Positive for cough, chest tightness and shortness of breath. Negative for wheezing.   Cardiovascular: Negative for chest pain, palpitations and leg swelling.  Gastrointestinal: Negative for abdominal pain, constipation, diarrhea, nausea and vomiting.  Genitourinary: Negative for dysuria and flank pain.  Musculoskeletal: Negative for arthralgias, back pain, gait problem and neck pain.  Skin: Negative for color change.  Allergic/Immunologic: Negative for environmental allergies and food allergies.  Neurological: Positive for dizziness and weakness. Negative for headaches.  Hematological: Does not bruise/bleed easily.  Psychiatric/Behavioral: Negative for agitation, behavioral problems (depression) and hallucinations.    Vital Signs: BP 135/65   Pulse 68   Temp 98.6 F (37 C)   Ht 5' 2" (1.575 m)   Wt 128 lb (58.1 kg)   SpO2 99%   BMI 23.41 kg/m     Observation/Objective: Briefly heard from [place], as her daughters were driving the virtual visit and briefly visualized Maryanne via video. She did not appear in any distress, did not hear coughing during visit.  Assessment/Plan: 1. Acute bacterial sinusitis Will start Zithromax due to symptoms described along with comorbid conditions and fragility.  - azithromycin (ZITHROMAX) 250 MG tablet; Take one tab po qd for 7 days  Dispense: 7 tablet; Refill: 0  2. Permanent atrial fibrillation [facility]) Discussed importance of continuing current medications to keep atrial fibrillation rate controlled. Encouraged to monitor her heart rate routinely during acute illness.  3. Shortness of breath Encouraged to continue use with daily inhaler and nebulizer treatments as needed to help with shortness of breath and chest tightness. Encouraged to reach out if symptoms worsen.  General Counseling: Marnette verbalizes understanding of the findings of today's phone visit and agrees with plan of treatment. I have discussed any further diagnostic evaluation that may be needed or ordered today. We also reviewed her medications today. she has been encouraged to call the office with any questions or concerns that should arise related to todays visit.   Meds ordered this encounter  Medications  . azithromycin (ZITHROMAX) 250 MG tablet    Sig: Take one tab po qd for 7 days    Dispense:  7 tablet    Refill:  0    Time spent: 30 Minutes  This patient was seen today by Theodoro Grist, AGNP-C in collaboration with Dr. Lavera Guise as part of a collaborative care agreement.  Dr Lavera Guise Internal medicine

## 2019-12-03 ENCOUNTER — Telehealth: Payer: Self-pay

## 2019-12-03 NOTE — Telephone Encounter (Signed)
Confirmed with pts daughter telephone visit for 8/17

## 2019-12-07 ENCOUNTER — Encounter: Payer: Self-pay | Admitting: Internal Medicine

## 2019-12-07 ENCOUNTER — Ambulatory Visit (INDEPENDENT_AMBULATORY_CARE_PROVIDER_SITE_OTHER): Payer: Medicare Other | Admitting: Internal Medicine

## 2019-12-07 VITALS — BP 131/68 | HR 75 | Temp 98.6°F | Resp 16 | Ht 62.0 in | Wt 128.0 lb

## 2019-12-07 DIAGNOSIS — I4821 Permanent atrial fibrillation: Secondary | ICD-10-CM

## 2019-12-07 DIAGNOSIS — J4541 Moderate persistent asthma with (acute) exacerbation: Secondary | ICD-10-CM

## 2019-12-07 DIAGNOSIS — I10 Essential (primary) hypertension: Secondary | ICD-10-CM | POA: Diagnosis not present

## 2019-12-07 NOTE — Progress Notes (Signed)
[facility] [place], [place] [address]  Internal MEDICINE  Telephone Visit  Patient Name: Ashley Weiss  [id number]  [id number]  Date of Service: 12/09/2019  I connected with the patient at 12:05 by telephone and verified the patients identity using two identifiers.   I discussed the limitations, risks, security and privacy concerns of performing an evaluation and management service by telephone and the availability of in person appointments. I also discussed with the patient that there may be a patient responsible charge related to the service.  The patient expressed understanding and agrees to proceed.    Chief Complaint  Patient presents with  . Follow-up    1 week   . Cough    still at night but not as bad as a few days ago, this morning mucus was light brown and yellow but now since breakfast it is clear  . Telephone Screen    [phone]  . Telephone Assessment    HPI  Pt is connected via video for office visit. She is feeling better. Asthma is better now. Denies any fever or sore throat. BP seems to be well controlled. No periods of elevated hR. Pt does have h/o atrial fib   Current Medication: Outpatient Encounter Medications as of 12/07/2019  Medication Sig  . albuterol (VENTOLIN HFA) 108 (90 Base) MCG/ACT inhaler Inhale 2 puffs into the lungs every 6 (six) hours as needed for wheezing or shortness of breath.  Marland Kitchen aspirin EC 81 MG tablet Take 1 tablet (81 mg total) by mouth daily.  . Blood Glucose Monitoring Suppl (FREESTYLE FREEDOM LITE) w/Device KIT 1 kit by Does not apply route daily.  Marland Kitchen denosumab (PROLIA) 60 MG/ML SOSY injection Inject 60 mg into the skin every 6 (six) months.  . diltiazem (CARDIZEM CD) 120 MG 24 hr capsule TAKE 1 CAPSULE BY MOUTH EVERY DAY  . FLOVENT HFA 110 MCG/ACT inhaler TAKE 2 PUFFS BY MOUTH TWICE A DAY  . Fluocinolone Acetonide 0.01 % OIL Place 4 drops in ear(s) at bedtime as needed.   . fluticasone (FLONASE) 50  MCG/ACT nasal spray PLACE 1 SPRAY INTO BOTH NOSTRILS DAILY.  . furosemide (LASIX) 40 MG tablet Take 1 tablet (40 mg) by mouth once daily as needed for weight gain, increased shortness of breath, or swelling  . glucose blood (FREESTYLE LITE) test strip Use as directed once a daily Diag E11.65  . guaiFENesin (MUCINEX) 600 MG 12 hr tablet Take by mouth 2 (two) times daily.  . hydrocortisone (ANUSOL-HC) 25 MG suppository Insert one after EACH bm QD for 7 days and then prn  . ipratropium (ATROVENT) 0.02 % nebulizer solution Inhale into the lungs.  Marland Kitchen ipratropium-albuterol (DUONEB) 0.5-2.5 (3) MG/3ML SOLN USE 1 VIAL 3 TIMES DAILY AS NEEDED FOR SHORTNESS OF BREATH DX J44.9  . Lancets (FREESTYLE) lancets Use as directed once daily diag e11.65  . levothyroxine (SYNTHROID) 25 MCG tablet Take 1 daily by mouth in the morning on a empty stomach  . metoprolol tartrate (LOPRESSOR) 100 MG tablet TAKE 1 TABLETS BY MOUTH TWICE DAILY  . metoprolol tartrate (LOPRESSOR) 25 MG tablet TAKE 1 TABLET (25 MG TOTAL) BY MOUTH DAILY AS NEEDED (FOR A FIB).  Marland Kitchen montelukast (SINGULAIR) 10 MG tablet TAKE 1 TABLET BY MOUTH EVERY DAY FOR COUGH  . Multiple Vitamins-Minerals (MULTIVITAMIN WITH MINERALS) tablet Take 1 tablet by mouth daily.  . OXYGEN Inhale 2 L into the lungs every evening. Sleep with nasal cannula w/ O2 at 2lpm.  .  potassium chloride (KLOR-CON M10) 10 MEQ tablet TAKE 1 TABLET BY MOUTH EVERY DAY PRN ON DAYS WHEN TAKING LASIX  . Respiratory Therapy Supplies (ONE FLOW SPIROMETER) DEVI Please use device as tolerated and according to instruction provided.  . rosuvastatin (CRESTOR) 5 MG tablet Take 1 tablet (5 mg total) by mouth daily at 6 PM.  . sertraline (ZOLOFT) 100 MG tablet Take 1 tablet (100 mg total) by mouth daily.  . vitamin C (ASCORBIC ACID) 500 MG tablet Take 500 mg by mouth daily.  . [DISCONTINUED] azithromycin (ZITHROMAX) 250 MG tablet Take one tab po qd for 7 days  . [DISCONTINUED] levofloxacin (LEVAQUIN) 250  MG tablet Take 1 tablet (250 mg total) by mouth daily. (Patient not taking: Reported on 12/07/2019)   No facility-administered encounter medications on file as of 12/07/2019.    Surgical History: Past Surgical History:  Procedure Laterality Date  . BREAST CYST ASPIRATION Left 20+ yrs ago  . CARDIOVERSION N/A 11/11/2018   Procedure: CARDIOVERSION;  Surgeon: Nelva Bush, MD;  Location: [facility] ORS;  Service: Cardiovascular;  Laterality: N/A;  . CATARACT EXTRACTION Bilateral [id number]  . cyst removal-right shoulder  1972  . otosclerosis Left 1988   hardening of bone, other 1989 redone  . THYROIDECTOMY  1970   nodule and 1/2 bridge removal  . WRIST SURGERY Left 11/29/2009    Medical History: Past Medical History:  Diagnosis Date  . Arthritis   . Asthma   . Cancer ([place])    skin  . Depression   . DVT (deep venous thrombosis) ([place])   . Hyperlipidemia   . Hypertension   . Phlebitis     Family History: Family History  Problem Relation Age of Onset  . Osteoarthritis Mother   . Depression Father   . Breast cancer Neg Hx     Social History   Socioeconomic History  . Marital status: Widowed    Spouse name: Not on file  . Number of children: Not on file  . Years of education: Not on file  . Highest education level: Not on file  Occupational History  . Not on file  Tobacco Use  . Smoking status: Never Smoker  . Smokeless tobacco: Never Used  Vaping Use  . Vaping Use: Never used  Substance and Sexual Activity  . Alcohol use: No  . Drug use: No  . Sexual activity: Never  Other Topics Concern  . Not on file  Social History Narrative  . Not on file   Social Determinants of Health   Financial Resource Strain:   . Difficulty of Paying Living Expenses: Not on file  Food Insecurity:   . Worried About Charity fundraiser in the Last Year: Not on file  . Ran Out of Food in the Last Year: Not on file  Transportation Needs:   . Lack of Transportation (Medical): Not on  file  . Lack of Transportation (Non-Medical): Not on file  Physical Activity:   . Days of Exercise per Week: Not on file  . Minutes of Exercise per Session: Not on file  Stress:   . Feeling of Stress : Not on file  Social Connections:   . Frequency of Communication with Friends and Family: Not on file  . Frequency of Social Gatherings with Friends and Family: Not on file  . Attends Religious Services: Not on file  . Active Member of Clubs or Organizations: Not on file  . Attends Archivist Meetings: Not on file  . Marital  Status: Not on file  Intimate Partner Violence:   . Fear of Current or Ex-Partner: Not on file  . Emotionally Abused: Not on file  . Physically Abused: Not on file  . Sexually Abused: Not on file    Review of Systems  Constitutional: Negative.  Negative for fatigue.  HENT: Positive for postnasal drip and sore throat.   Eyes: Negative.   Respiratory: Positive for cough. Negative for wheezing.   Cardiovascular: Positive for palpitations. Negative for chest pain.  Gastrointestinal: Negative.   Musculoskeletal: Negative.     Vital Signs: BP 131/68   Pulse 75   Temp 98.6 F (37 C)   Resp 16   Ht _0  (1.575 m)   Wt 128 lb (58.1 kg)   SpO2 99%   BMI 23.41 kg/m    Observation/Objective: Pt is able to cary out conversation without any pauses. Looked comfortable, daughter in the room with her   Assessment/Plan: 1. Moderate persistent asthma with acute exacerbation - Pt is instructed to increase neb treatment to 2-3 times per day, finish abx   - DG Chest 2 View; Future  2. Permanent atrial fibrillation (HCC) - Controlled with meds   3. Essential (primary) hypertension - Stable   General Counseling: Rilyn verbalizes understanding of the findings of today's phone visit and agrees with plan of treatment. I have discussed any further diagnostic evaluation that may be needed or ordered today. We also reviewed her medications today. she has been  encouraged to call the office with any questions or concerns that should arise related to todays visit.    Orders Placed This Encounter  Procedures  . DG Chest 2 View    Time spent:20Minutes  Dr Lavera Guise Internal medicine

## 2019-12-18 ENCOUNTER — Ambulatory Visit: Payer: Medicare Other

## 2019-12-20 ENCOUNTER — Other Ambulatory Visit: Payer: Self-pay

## 2019-12-20 ENCOUNTER — Telehealth: Payer: Self-pay

## 2019-12-20 ENCOUNTER — Ambulatory Visit
Admission: RE | Admit: 2019-12-20 | Discharge: 2019-12-20 | Disposition: A | Discharge status: Home / Self Care | Payer: Medicare Other | Source: Ambulatory Visit | Attending: Internal Medicine | Admitting: Internal Medicine

## 2019-12-20 ENCOUNTER — Ambulatory Visit
Admission: RE | Admit: 2019-12-20 | Discharge: 2019-12-20 | Disposition: A | Discharge status: Home / Self Care | Payer: Medicare Other | Attending: Internal Medicine | Admitting: Internal Medicine

## 2019-12-20 DIAGNOSIS — R05 Cough: Secondary | ICD-10-CM | POA: Diagnosis not present

## 2019-12-20 DIAGNOSIS — J4541 Moderate persistent asthma with (acute) exacerbation: Secondary | ICD-10-CM | POA: Insufficient documentation

## 2019-12-20 DIAGNOSIS — H0100A Unspecified blepharitis right eye, upper and lower eyelids: Secondary | ICD-10-CM | POA: Diagnosis not present

## 2019-12-20 NOTE — Telephone Encounter (Signed)
Pt daughter called that she can used Anusol suppository at night as per heather advised its ok and also make appt with dr Humphrey Rolls tomorrow for hemorrhoids and xray result

## 2019-12-21 ENCOUNTER — Encounter: Payer: Self-pay | Admitting: Internal Medicine

## 2019-12-21 ENCOUNTER — Ambulatory Visit (INDEPENDENT_AMBULATORY_CARE_PROVIDER_SITE_OTHER): Payer: Medicare Other | Admitting: Internal Medicine

## 2019-12-21 DIAGNOSIS — J8410 Pulmonary fibrosis, unspecified: Secondary | ICD-10-CM

## 2019-12-21 DIAGNOSIS — I480 Paroxysmal atrial fibrillation: Secondary | ICD-10-CM | POA: Diagnosis not present

## 2019-12-21 DIAGNOSIS — J4541 Moderate persistent asthma with (acute) exacerbation: Secondary | ICD-10-CM

## 2019-12-21 DIAGNOSIS — K649 Unspecified hemorrhoids: Secondary | ICD-10-CM

## 2019-12-21 NOTE — Progress Notes (Signed)
[facility] [place], [place] [address]  Internal MEDICINE  Telephone Visit  Patient Name: Ashley Weiss  [id number]  [id number]  Date of Service: 12/26/2019  I connected with the patient at 10 am by video and verified the patients identity using two identifiers.   I discussed the limitations, risks, security and privacy concerns of performing an evaluation and management service by telephone and the availability of in person appointments. I also discussed with the patient that there may be a patient responsible charge related to the service.  The patient expressed understanding and agrees to proceed.    Chief Complaint  Patient presents with  . Telephone Screen  . Telephone Assessment  . Hemorrhoids    severe bleeding on 12/19/19, none last night    HPI Pt is feeling better, denies any cough, slept well at night. She is being bothered by her hemorrhoids at this time. She has started to use her suppository more regularly. However at times she is concerend about her bleeding, daughter is in the room with her. She is wearing her oxygen   Current Medication: Outpatient Encounter Medications as of 12/21/2019  Medication Sig  . albuterol (VENTOLIN HFA) 108 (90 Base) MCG/ACT inhaler Inhale 2 puffs into the lungs every 6 (six) hours as needed for wheezing or shortness of breath.  Marland Kitchen aspirin EC 81 MG tablet Take 1 tablet (81 mg total) by mouth daily.  . Blood Glucose Monitoring Suppl (FREESTYLE FREEDOM LITE) w/Device KIT 1 kit by Does not apply route daily.  Marland Kitchen denosumab (PROLIA) 60 MG/ML SOSY injection Inject 60 mg into the skin every 6 (six) months.  . diltiazem (CARDIZEM CD) 120 MG 24 hr capsule TAKE 1 CAPSULE BY MOUTH EVERY DAY  . FLOVENT HFA 110 MCG/ACT inhaler TAKE 2 PUFFS BY MOUTH TWICE A DAY  . Fluocinolone Acetonide 0.01 % OIL Place 4 drops in ear(s) at bedtime as needed.   . fluticasone (FLONASE) 50 MCG/ACT nasal spray PLACE 1 SPRAY INTO BOTH NOSTRILS DAILY.   . furosemide (LASIX) 40 MG tablet Take 1 tablet (40 mg) by mouth once daily as needed for weight gain, increased shortness of breath, or swelling  . glucose blood (FREESTYLE LITE) test strip Use as directed once a daily Diag E11.65  . guaiFENesin (MUCINEX) 600 MG 12 hr tablet Take by mouth 2 (two) times daily.  . hydrocortisone (ANUSOL-HC) 25 MG suppository Insert one after EACH bm QD for 7 days and then prn  . ipratropium (ATROVENT) 0.02 % nebulizer solution Inhale into the lungs.  Marland Kitchen ipratropium-albuterol (DUONEB) 0.5-2.5 (3) MG/3ML SOLN USE 1 VIAL 3 TIMES DAILY AS NEEDED FOR SHORTNESS OF BREATH DX J44.9  . Lancets (FREESTYLE) lancets Use as directed once daily diag e11.65  . levothyroxine (SYNTHROID) 25 MCG tablet Take 1 daily by mouth in the morning on a empty stomach  . metoprolol tartrate (LOPRESSOR) 100 MG tablet TAKE 1 TABLETS BY MOUTH TWICE DAILY  . metoprolol tartrate (LOPRESSOR) 25 MG tablet TAKE 1 TABLET (25 MG TOTAL) BY MOUTH DAILY AS NEEDED (FOR A FIB).  Marland Kitchen montelukast (SINGULAIR) 10 MG tablet TAKE 1 TABLET BY MOUTH EVERY DAY FOR COUGH  . Multiple Vitamins-Minerals (MULTIVITAMIN WITH MINERALS) tablet Take 1 tablet by mouth daily.  . OXYGEN Inhale 2 L into the lungs every evening. Sleep with nasal cannula w/ O2 at 2lpm.  . potassium chloride (KLOR-CON M10) 10 MEQ tablet TAKE 1 TABLET BY MOUTH EVERY DAY PRN ON DAYS WHEN TAKING LASIX  .  Respiratory Therapy Supplies (ONE FLOW SPIROMETER) DEVI Please use device as tolerated and according to instruction provided.  . rosuvastatin (CRESTOR) 5 MG tablet Take 1 tablet (5 mg total) by mouth daily at 6 PM.  . sertraline (ZOLOFT) 100 MG tablet Take 1 tablet (100 mg total) by mouth daily.  . vitamin C (ASCORBIC ACID) 500 MG tablet Take 500 mg by mouth daily.   No facility-administered encounter medications on file as of 12/21/2019.    Surgical History: Past Surgical History:  Procedure Laterality Date  . BREAST CYST ASPIRATION Left 20+ yrs  ago  . CARDIOVERSION N/A 11/11/2018   Procedure: CARDIOVERSION;  Surgeon: Nelva Bush, MD;  Location: [facility] ORS;  Service: Cardiovascular;  Laterality: N/A;  . CATARACT EXTRACTION Bilateral [id number]  . cyst removal-right shoulder  1972  . otosclerosis Left 1988   hardening of bone, other 1989 redone  . THYROIDECTOMY  1970   nodule and 1/2 bridge removal  . WRIST SURGERY Left 11/29/2009    Medical History: Past Medical History:  Diagnosis Date  . Arthritis   . Asthma   . Cancer ([place])    skin  . Depression   . DVT (deep venous thrombosis) ([place])   . Hyperlipidemia   . Hypertension   . Phlebitis     Family History: Family History  Problem Relation Age of Onset  . Osteoarthritis Mother   . Depression Father   . Breast cancer Neg Hx     Social History   Socioeconomic History  . Marital status: Widowed    Spouse name: Not on file  . Number of children: Not on file  . Years of education: Not on file  . Highest education level: Not on file  Occupational History  . Not on file  Tobacco Use  . Smoking status: Never Smoker  . Smokeless tobacco: Never Used  Vaping Use  . Vaping Use: Never used  Substance and Sexual Activity  . Alcohol use: No  . Drug use: No  . Sexual activity: Never  Other Topics Concern  . Not on file  Social History Narrative  . Not on file   Social Determinants of Health   Financial Resource Strain:   . Difficulty of Paying Living Expenses: Not on file  Food Insecurity:   . Worried About Charity fundraiser in the Last Year: Not on file  . Ran Out of Food in the Last Year: Not on file  Transportation Needs:   . Lack of Transportation (Medical): Not on file  . Lack of Transportation (Non-Medical): Not on file  Physical Activity:   . Days of Exercise per Week: Not on file  . Minutes of Exercise per Session: Not on file  Stress:   . Feeling of Stress : Not on file  Social Connections:   . Frequency of Communication with Friends and  Family: Not on file  . Frequency of Social Gatherings with Friends and Family: Not on file  . Attends Religious Services: Not on file  . Active Member of Clubs or Organizations: Not on file  . Attends Archivist Meetings: Not on file  . Marital Status: Not on file  Intimate Partner Violence:   . Fear of Current or Ex-Partner: Not on file  . Emotionally Abused: Not on file  . Physically Abused: Not on file  . Sexually Abused: Not on file    Review of Systems  Constitutional: Negative for diaphoresis.  HENT: Negative.   Respiratory: Negative.  Negative for  cough and shortness of breath.   Cardiovascular: Negative.   Gastrointestinal: Positive for anal bleeding. Negative for constipation and rectal pain.  Genitourinary: Negative for hematuria.  Neurological: Negative.   Hematological: Negative.   Psychiatric/Behavioral: Negative for behavioral problems and confusion.    Vital Signs: BP 133/62   Pulse 67   Temp 98.8 F (37.1 C)   Resp 16   Ht _0  (1.575 m)   Wt 128 lb 9.6 oz (58.3 kg)   SpO2 99%   BMI 23.52 kg/m    Observation/Objective: Pt looks good, NAD, able to communicate well    Assessment/Plan: 1. Hemorrhoids, unspecified hemorrhoid type Continue to use her Suppository - Ambulatory referral to General Surgery  2. Moderate persistent asthma with acute exacerbation Controlled at the moment, continue nebulizer treatment at home   3. Pulmonary fibrosis ([place]) Pt is on oxygnet dependent and followed up by pulmonary   4. Paroxysmal atrial fibrillation (HCC) Follow up cardiology, pt is not on any anticoagulation due to high risk of bleeding and falls   General Counseling: Anaria verbalizes understanding of the findings of today's phone visit and agrees with plan of treatment. I have discussed any further diagnostic evaluation that may be needed or ordered today. We also reviewed her medications today. she has been encouraged to call the office with any  questions or concerns that should arise related to todays visit.  Orders Placed This Encounter  Procedures  . Ambulatory referral to General Surgery    Time spent:32 Minutes    Dr Lavera Guise Internal medicine

## 2019-12-23 ENCOUNTER — Encounter: Payer: Self-pay | Admitting: Surgery

## 2019-12-23 ENCOUNTER — Ambulatory Visit (INDEPENDENT_AMBULATORY_CARE_PROVIDER_SITE_OTHER): Payer: Medicare Other | Admitting: Surgery

## 2019-12-23 ENCOUNTER — Other Ambulatory Visit: Payer: Self-pay

## 2019-12-23 ENCOUNTER — Telehealth: Payer: Self-pay

## 2019-12-23 VITALS — BP 166/91 | HR 72 | Temp 98.3°F | Wt 132.0 lb

## 2019-12-23 DIAGNOSIS — K921 Melena: Secondary | ICD-10-CM | POA: Diagnosis not present

## 2019-12-23 DIAGNOSIS — K5900 Constipation, unspecified: Secondary | ICD-10-CM | POA: Diagnosis not present

## 2019-12-23 NOTE — Progress Notes (Signed)
Patient ID: Ashley Weiss, female   DOB: 04-18-1920, 86 y.o.   MRN: [id number]  Chief Complaint: Bright red blood associated with isolated bowel movement.  History of Present Illness Ashley Weiss is a 86 y.o. female with a history of constipation.  And the patient is known by her daughter to have trouble moving her bowels when on the commode.  She has a bedside commode which was noted after a rather challenging movement to have a remarkable evidence of bright red blood, this quickly diminished over a short time of the day.  They have seen no additional bloody movement since.  She is not had a colonoscopy examination primarily due to her frailty/atrial fibrillation history.  Her daughter presents with her today and is the primary historian.  She apparently also has a history of back pain.  She denies any recent nausea, vomiting, fevers or chills.  She denies her being any blood clots in the commode.  There is a question of there being blood in the urine as well.  No known history of hemorrhoids.  She does take Eliquis.  Past Medical History Past Medical History:  Diagnosis Date  . Arthritis   . Asthma   . Cancer ([place])    skin  . Depression   . DVT (deep venous thrombosis) ([place])   . Hyperlipidemia   . Hypertension   . Phlebitis       Past Surgical History:  Procedure Laterality Date  . BREAST CYST ASPIRATION Left 20+ yrs ago  . CARDIOVERSION N/A 11/11/2018   Procedure: CARDIOVERSION;  Surgeon: Nelva Bush, MD;  Location: [facility] ORS;  Service: Cardiovascular;  Laterality: N/A;  . CATARACT EXTRACTION Bilateral [id number]  . cyst removal-right shoulder  1972  . otosclerosis Left 1988   hardening of bone, other 1989 redone  . THYROIDECTOMY  1970   nodule and 1/2 bridge removal  . WRIST SURGERY Left 11/29/2009    Allergies  Allergen Reactions  . Hydralazine Itching and Swelling  . Biaxin [Clarithromycin] Nausea And Vomiting  . Ciprofloxacin Other (See Comments)    Hand  cramping   . Meloxicam Other (See Comments)    Unknown  . Minocycline Other (See Comments)    Unknown    Current Outpatient Medications  Medication Sig Dispense Refill  . albuterol (VENTOLIN HFA) 108 (90 Base) MCG/ACT inhaler Inhale 2 puffs into the lungs every 6 (six) hours as needed for wheezing or shortness of breath. 18 g 3  . aspirin EC 81 MG tablet Take 1 tablet (81 mg total) by mouth daily.    . Blood Glucose Monitoring Suppl (FREESTYLE FREEDOM LITE) w/Device KIT 1 kit by Does not apply route daily. 1 each 0  . denosumab (PROLIA) 60 MG/ML SOSY injection Inject 60 mg into the skin every 6 (six) months.    . diltiazem (CARDIZEM CD) 120 MG 24 hr capsule TAKE 1 CAPSULE BY MOUTH EVERY DAY 90 capsule 2  . ELIQUIS 2.5 MG TABS tablet Take 2.5 mg by mouth 2 (two) times daily.    Marland Kitchen FLOVENT HFA 110 MCG/ACT inhaler TAKE 2 PUFFS BY MOUTH TWICE A DAY 3 Inhaler 3  . Fluocinolone Acetonide 0.01 % OIL Place 4 drops in ear(s) at bedtime as needed.     . fluticasone (FLONASE) 50 MCG/ACT nasal spray PLACE 1 SPRAY INTO BOTH NOSTRILS DAILY. 48 mL 2  . furosemide (LASIX) 40 MG tablet Take 1 tablet (40 mg) by mouth once daily as needed for weight gain, increased shortness of  breath, or swelling    . glucose blood (FREESTYLE LITE) test strip Use as directed once a daily Diag E11.65 100 each 12  . guaiFENesin (MUCINEX) 600 MG 12 hr tablet Take by mouth 2 (two) times daily.    Marland Kitchen HYDROcodone-acetaminophen (NORCO/VICODIN) 5-325 MG tablet Take 1 tablet by mouth every 6 (six) hours as needed.    . hydrocortisone (ANUSOL-HC) 25 MG suppository Insert one after EACH bm QD for 7 days and then prn 25 suppository 0  . ipratropium-albuterol (DUONEB) 0.5-2.5 (3) MG/3ML SOLN USE 1 VIAL 3 TIMES DAILY AS NEEDED FOR SHORTNESS OF BREATH DX J44.9 810 mL 1  . Lancets (FREESTYLE) lancets Use as directed once daily diag e11.65 100 each 12  . levothyroxine (SYNTHROID) 25 MCG tablet Take 1 daily by mouth in the morning on a empty  stomach 90 tablet 1  . metoprolol tartrate (LOPRESSOR) 100 MG tablet TAKE 1 TABLETS BY MOUTH TWICE DAILY 180 tablet 2  . metoprolol tartrate (LOPRESSOR) 25 MG tablet TAKE 1 TABLET (25 MG TOTAL) BY MOUTH DAILY AS NEEDED (FOR A FIB). 90 tablet 0  . montelukast (SINGULAIR) 10 MG tablet TAKE 1 TABLET BY MOUTH EVERY DAY FOR COUGH 90 tablet 3  . Multiple Vitamins-Minerals (MULTIVITAMIN WITH MINERALS) tablet Take 1 tablet by mouth daily.    . OXYGEN Inhale 2 L into the lungs every evening. Sleep with nasal cannula w/ O2 at 2lpm.    . potassium chloride (KLOR-CON M10) 10 MEQ tablet TAKE 1 TABLET BY MOUTH EVERY DAY PRN ON DAYS WHEN TAKING LASIX 90 tablet 0  . Respiratory Therapy Supplies (ONE FLOW SPIROMETER) DEVI Please use device as tolerated and according to instruction provided. 1 Device 0  . rosuvastatin (CRESTOR) 5 MG tablet Take 1 tablet (5 mg total) by mouth daily at 6 PM. 90 tablet 2  . sertraline (ZOLOFT) 100 MG tablet Take 1 tablet (100 mg total) by mouth daily. 90 tablet 2  . vitamin C (ASCORBIC ACID) 500 MG tablet Take 500 mg by mouth daily.    Marland Kitchen ipratropium (ATROVENT) 0.02 % nebulizer solution Inhale into the lungs.     No current facility-administered medications for this visit.    Family History Family History  Problem Relation Age of Onset  . Osteoarthritis Mother   . Depression Father   . Breast cancer Neg Hx       Social History Social History   Tobacco Use  . Smoking status: Never Smoker  . Smokeless tobacco: Never Used  Vaping Use  . Vaping Use: Never used  Substance Use Topics  . Alcohol use: No  . Drug use: No        Review of Systems  Constitutional: Negative.   HENT: Positive for hearing loss.   Eyes: Negative.   Respiratory: Positive for cough, sputum production and shortness of breath.   Cardiovascular: Positive for palpitations (Atrial fibrillation/CHF).  Gastrointestinal: Positive for blood in stool and constipation.  Genitourinary: Negative.    Skin: Negative.   Neurological: Positive for dizziness.  Psychiatric/Behavioral: Negative.       Physical Exam Blood pressure (!) 166/91, pulse 72, temperature 98.3 F (36.8 C), temperature source Oral, weight 132 lb (59.9 kg), SpO2 98 %. Last Weight  Most recent update: 12/23/2019 11:53 AM   Weight  59.9 kg (132 lb)            CONSTITUTIONAL: Well developed, and adequately nourished, appropriately responsive and aware without distress.  Appears frail but expected for her age.  EYES: Sclera non-icteric.   EARS, NOSE, MOUTH AND THROAT: Double mask worn. Hearing is intact to voice.  NECK: Trachea is midline, and there is no jugular venous distension.  LYMPH NODES:  Lymph nodes in the neck are not enlarged. RESPIRATORY:  Lungs are clear, and breath sounds are equal bilaterally. Normal respiratory effort without pathologic use of accessory muscles. CARDIOVASCULAR: Heart is regular in rate. GI: The abdomen is soft, nontender, and nondistended. There were no palpable masses. I did not appreciate hepatosplenomegaly. There were normal bowel sounds. GU: DRE: No remarkable external anal pathology present.  No significant tags, induration, fluctuance or tenderness.  There is a well formed pellet-like stool in the distal rectal vault.  There is no palpable polyp or mucosal abnormality.  There is no gross blood on examination glove. MUSCULOSKELETAL:  Symmetrical muscle tone appreciated in all four extremities.    SKIN: Skin turgor is normal. No pathologic skin lesions appreciated.  NEUROLOGIC: She utilizes a walker for ambulation, and is quite dependent with positioning.  Motor and sensation appear grossly as expected for age.  Cranial nerves are grossly without defect. PSYCH:  Affect is appropriate for situation.  Data Reviewed I have personally reviewed what is currently available of the patient's imaging, recent labs and medical records.   Labs:  CBC Latest Ref Rng & Units 11/26/2018 11/16/2018  11/14/2018  WBC 4.0 - 10.5 K/uL 9.2 8.6 11.0(H)  Hemoglobin 12.0 - 15.0 g/dL 13.0 12.5 12.6  Hematocrit 36 - 46 % 40.0 39.0 37.7  Platelets 150 - 400 K/uL 255 231 218   CMP Latest Ref Rng & Units 12/10/2018 11/26/2018 11/16/2018  Glucose 65 - 99 mg/dL 140(H) 112(H) 143(H)  BUN 10 - 36 mg/dL _0 Creatinine 0.57 - 1.00 mg/dL 0.55(L) 0.57 0.40(L)  Sodium 134 - 144 mmol/L 140 136 138  Potassium 3.5 - 5.2 mmol/L 4.3 4.0 4.1  Chloride 96 - 106 mmol/L 94(L) 92(L) 97(L)  CO2 20 - 29 mmol/L 28 33(H) 33(H)  Calcium 8.7 - 10.3 mg/dL 9.3 9.1 8.7(L)  Total Protein 6.5 - 8.1 g/dL - 8.0 -  Total Bilirubin 0.3 - 1.2 mg/dL - 0.4 -  Alkaline Phos 38 - 126 U/L - 63 -  AST 15 - 41 U/L - 27 -  ALT 0 - 44 U/L - 25 -      Imaging:  Within last 24 hrs: No results found.  Assessment    Likely limited episode of blood with traumatic bowel movement, considering history of constipation. Patient Active Problem List   Diagnosis Date Noted  . Acute bacterial sinusitis 10/30/2019  . Acute heart failure with preserved ejection fraction (HFpEF) ([place])   . Shortness of breath 10/21/2018  . Atrial fibrillation with rapid ventricular response ([place]) 10/10/2018  . Atrial fibrillation with RVR ([place]) 10/10/2018  . Nocturnal hypoxemia due to asthma 10/29/2017  . Acute pain of right wrist 09/30/2017  . Hyperlipidemia 07/23/2017  . Osteoporosis, post-menopausal 07/03/2017  . Pain in left knee 05/28/2017  . Essential (primary) hypertension 05/28/2017  . Cardiac arrhythmia 05/28/2017  . Hypothyroidism 05/28/2017  . Injury of right hip 06/15/2015  . Mild intermittent asthma without complication 59/45/8592    Plan    Advised initiation of MiraLAX twice daily, would supplement with fiber and fluids for long-term control.  The daughter appears to have access to [facility] and is well familiar with it, and will utilize it to help her mother have more frequent and less traumatic bowel movements. Other options  of  endoscopy discussed and these are not areas her daughter wishes to go considering the risks she is for any form of heavy sedation. Should the bleeding worsen and become more consistent despite these efforts, further evaluation may be necessary.  This was discussed in detail.  Face-to-face time spent with the patient and accompanying care providers(if present) was 30 minutes, with more than 50% of the time spent counseling, educating, and coordinating care of the patient.      Ronny Bacon M.D., FACS 12/23/2019, 2:53 PM

## 2019-12-23 NOTE — Patient Instructions (Addendum)
Dr.Rodenberg suggest patient to try Fiber (Metamucil or Benefiber) or Fiber supplements and water intake. Patient may add Miralax daily to fluids to help regulate bowels.  Patient was recommended if after trying the Fiber regimen the blood continues, to give our office a call and we will sent a referral to Gastroenterologist or Urologist office.    High-Fiber Diet Fiber, also called dietary fiber, is a type of carbohydrate that is found in fruits, vegetables, whole grains, and beans. A high-fiber diet can have many health benefits. Your health care provider may recommend a high-fiber diet to help: Prevent constipation. Fiber can make your bowel movements more regular. Lower your cholesterol. Relieve the following conditions: Swelling of veins in the anus (hemorrhoids). Swelling and irritation (inflammation) of specific areas of the digestive tract (uncomplicated diverticulosis). A problem of the large intestine (colon) that sometimes causes pain and diarrhea (irritable bowel syndrome, IBS). Prevent overeating as part of a weight-loss plan. Prevent heart disease, type 2 diabetes, and certain cancers. What is my plan? The recommended daily fiber intake in grams (g) includes: 38 g for men age 59 or younger. 30 g for men over age 38. 83 g for women age 42 or younger. 21 g for women over age 51. You can get the recommended daily intake of dietary fiber by: Eating a variety of fruits, vegetables, grains, and beans. Taking a fiber supplement, if it is not possible to get enough fiber through your diet. What do I need to know about a high-fiber diet? It is better to get fiber through food sources rather than from fiber supplements. There is not a lot of research about how effective supplements are. Always check the fiber content on the nutrition facts label of any prepackaged food. Look for foods that contain 5 g of fiber or more per serving. Talk with a diet and nutrition specialist (dietitian) if  you have questions about specific foods that are recommended or not recommended for your medical condition, especially if those foods are not listed below. Gradually increase how much fiber you consume. If you increase your intake of dietary fiber too quickly, you may have bloating, cramping, or gas. Drink plenty of water. Water helps you to digest fiber. What are tips for following this plan? Eat a wide variety of high-fiber foods. Make sure that half of the grains that you eat each day are whole grains. Eat breads and cereals that are made with whole-grain flour instead of refined flour or white flour. Eat brown rice, bulgur wheat, or millet instead of white rice. Start the day with a breakfast that is high in fiber, such as a cereal that contains 5 g of fiber or more per serving. Use beans in place of meat in soups, salads, and pasta dishes. Eat high-fiber snacks, such as berries, raw vegetables, nuts, and popcorn. Choose whole fruits and vegetables instead of processed forms like juice or sauce. What foods can I eat?  Fruits Berries. Pears. Apples. Oranges. Avocado. Prunes and raisins. Dried figs. Vegetables Sweet potatoes. Spinach. Kale. Artichokes. Cabbage. Broccoli. Cauliflower. Green peas. Carrots. Squash. Grains Whole-grain breads. Multigrain cereal. Oats and oatmeal. Brown rice. Barley. Bulgur wheat. [place]. Quinoa. Bran muffins. Popcorn. Rye wafer crackers. Meats and other proteins Navy, kidney, and pinto beans. Soybeans. Split peas. Lentils. Nuts and seeds. Dairy Fiber-fortified yogurt. Beverages Fiber-fortified soy milk. Fiber-fortified orange juice. Other foods Fiber bars. The items listed above may not be a complete list of recommended foods and beverages. Contact a dietitian for more  options. What foods are not recommended? Fruits Fruit juice. Cooked, strained fruit. Vegetables Fried potatoes. Canned vegetables. Well-cooked vegetables. Grains White bread. Pasta made  with refined flour. White rice. Meats and other proteins Fatty cuts of meat. Fried chicken or fried fish. Dairy Milk. Yogurt. Cream cheese. Sour cream. Fats and oils Butters. Beverages Soft drinks. Other foods Cakes and pastries. The items listed above may not be a complete list of foods and beverages to avoid. Contact a dietitian for more information. Summary Fiber is a type of carbohydrate. It is found in fruits, vegetables, whole grains, and beans. There are many health benefits of eating a high-fiber diet, such as preventing constipation, lowering blood cholesterol, helping with weight loss, and reducing your risk of heart disease, diabetes, and certain cancers. Gradually increase your intake of fiber. Increasing too fast can result in cramping, bloating, and gas. Drink plenty of water while you increase your fiber. The best sources of fiber include whole fruits and vegetables, whole grains, nuts, seeds, and beans. This information is not intended to replace advice given to you by your health care provider. Make sure you discuss any questions you have with your health care provider. Document Revised: 02/10/2017 Document Reviewed: 02/10/2017 Elsevier Patient Education  2020 [employer].

## 2019-12-28 ENCOUNTER — Ambulatory Visit: Payer: Medicare Other | Admitting: Internal Medicine

## 2019-12-31 ENCOUNTER — Encounter: Payer: Self-pay | Admitting: Internal Medicine

## 2020-01-07 DIAGNOSIS — H6122 Impacted cerumen, left ear: Secondary | ICD-10-CM | POA: Diagnosis not present

## 2020-01-07 DIAGNOSIS — R42 Dizziness and giddiness: Secondary | ICD-10-CM | POA: Diagnosis not present

## 2020-01-07 DIAGNOSIS — H6062 Unspecified chronic otitis externa, left ear: Secondary | ICD-10-CM | POA: Diagnosis not present

## 2020-01-07 DIAGNOSIS — H903 Sensorineural hearing loss, bilateral: Secondary | ICD-10-CM | POA: Diagnosis not present

## 2020-01-10 DIAGNOSIS — M810 Age-related osteoporosis without current pathological fracture: Secondary | ICD-10-CM | POA: Diagnosis not present

## 2020-01-11 ENCOUNTER — Ambulatory Visit: Payer: Medicare Other | Admitting: Internal Medicine

## 2020-01-19 ENCOUNTER — Telehealth: Payer: Self-pay

## 2020-01-19 NOTE — Telephone Encounter (Signed)
CMN for cpap supplies signed by provider and handed to [place] with [facility].

## 2020-01-25 ENCOUNTER — Ambulatory Visit (INDEPENDENT_AMBULATORY_CARE_PROVIDER_SITE_OTHER): Payer: Medicare Other | Admitting: Internal Medicine

## 2020-01-25 ENCOUNTER — Other Ambulatory Visit: Payer: Self-pay

## 2020-01-25 ENCOUNTER — Encounter: Payer: Self-pay | Admitting: Internal Medicine

## 2020-01-25 DIAGNOSIS — I700 Atherosclerosis of aorta: Secondary | ICD-10-CM | POA: Diagnosis not present

## 2020-01-25 DIAGNOSIS — J8410 Pulmonary fibrosis, unspecified: Secondary | ICD-10-CM

## 2020-01-25 DIAGNOSIS — J4541 Moderate persistent asthma with (acute) exacerbation: Secondary | ICD-10-CM

## 2020-01-25 DIAGNOSIS — I10 Essential (primary) hypertension: Secondary | ICD-10-CM

## 2020-01-25 DIAGNOSIS — I4821 Permanent atrial fibrillation: Secondary | ICD-10-CM

## 2020-01-25 DIAGNOSIS — R0602 Shortness of breath: Secondary | ICD-10-CM

## 2020-01-25 MED ORDER — HYDROCOD POLST-CPM POLST ER 10-8 MG/5ML PO SUER: ORAL | 0 refills | Status: DC

## 2020-01-25 MED ORDER — PREDNISONE 10 MG PO TABS: ORAL_TABLET | ORAL | 0 refills | Status: DC

## 2020-01-25 NOTE — Progress Notes (Signed)
[facility] [place], [place] [address]  Internal MEDICINE  Office Visit Note  Patient Name: Ashley Weiss  [id number]  [id number]  Date of Service: 01/28/2020  Chief Complaint  Patient presents with   Follow-up    4 month, CHF, COPD, A-fib, recent congestion   pain management form    acknowledge   oxygen review    needs 6 min walk   Quality Metric Gaps    Tdap, flu vacc    HPI  Pt is here for routine follow up. She has multiple medical problems, her asthma has been bothering her, ongoing cough at night, she is not using her nebulizer as prescribed, daughter thinks it might give her tachycardia. Pt moved to live with her daughter and might be looking for new PCP. She contiues to be on O2 from 1L- 3L [place]. Will need 6 min walk to continue to qualify for it  Does get sob with exertion, does see pulmonary and cardiology . BP is slightly elevated today, repeat is 130/70  Current Medication: Outpatient Encounter Medications as of 01/25/2020  Medication Sig   albuterol (VENTOLIN HFA) 108 (90 Base) MCG/ACT inhaler Inhale 2 puffs into the lungs every 6 (six) hours as needed for wheezing or shortness of breath.   aspirin EC 81 MG tablet Take 1 tablet (81 mg total) by mouth daily.   Blood Glucose Monitoring Suppl (FREESTYLE FREEDOM LITE) w/Device KIT 1 kit by Does not apply route daily.   denosumab (PROLIA) 60 MG/ML SOSY injection Inject 60 mg into the skin every 6 (six) months.   diltiazem (CARDIZEM CD) 120 MG 24 hr capsule TAKE 1 CAPSULE BY MOUTH EVERY DAY   ELIQUIS 2.5 MG TABS tablet Take 2.5 mg by mouth 2 (two) times daily.   FLOVENT HFA 110 MCG/ACT inhaler TAKE 2 PUFFS BY MOUTH TWICE A DAY   Fluocinolone Acetonide 0.01 % OIL Place 4 drops in ear(s) at bedtime as needed.    fluticasone (FLONASE) 50 MCG/ACT nasal spray PLACE 1 SPRAY INTO BOTH NOSTRILS DAILY.   furosemide (LASIX) 40 MG tablet Take 1 tablet (40 mg) by mouth once daily as needed for  weight gain, increased shortness of breath, or swelling   glucose blood (FREESTYLE LITE) test strip Use as directed once a daily Diag E11.65   guaiFENesin (MUCINEX) 600 MG 12 hr tablet Take by mouth 2 (two) times daily.   HYDROcodone-acetaminophen (NORCO/VICODIN) 5-325 MG tablet Take 1 tablet by mouth every 6 (six) hours as needed.   hydrocortisone (ANUSOL-HC) 25 MG suppository Insert one after EACH bm QD for 7 days and then prn   ipratropium-albuterol (DUONEB) 0.5-2.5 (3) MG/3ML SOLN USE 1 VIAL 3 TIMES DAILY AS NEEDED FOR SHORTNESS OF BREATH DX J44.9   Lancets (FREESTYLE) lancets Use as directed once daily diag e11.65   levothyroxine (SYNTHROID) 25 MCG tablet Take 1 daily by mouth in the morning on a empty stomach   metoprolol tartrate (LOPRESSOR) 100 MG tablet TAKE 1 TABLETS BY MOUTH TWICE DAILY   metoprolol tartrate (LOPRESSOR) 25 MG tablet TAKE 1 TABLET (25 MG TOTAL) BY MOUTH DAILY AS NEEDED (FOR A FIB).   montelukast (SINGULAIR) 10 MG tablet TAKE 1 TABLET BY MOUTH EVERY DAY FOR COUGH   Multiple Vitamins-Minerals (MULTIVITAMIN WITH MINERALS) tablet Take 1 tablet by mouth daily.   OXYGEN Inhale 2 L into the lungs every evening. Sleep with nasal cannula w/ O2 at 2lpm.   potassium chloride (KLOR-CON M10) 10 MEQ tablet TAKE 1  TABLET BY MOUTH EVERY DAY PRN ON DAYS WHEN TAKING LASIX   Respiratory Therapy Supplies (ONE FLOW SPIROMETER) DEVI Please use device as tolerated and according to instruction provided.   rosuvastatin (CRESTOR) 5 MG tablet Take 1 tablet (5 mg total) by mouth daily at 6 PM.   sertraline (ZOLOFT) 100 MG tablet Take 1 tablet (100 mg total) by mouth daily.   vitamin C (ASCORBIC ACID) 500 MG tablet Take 500 mg by mouth daily.   chlorpheniramine-HYDROcodone (TUSSIONEX PENNKINETIC ER) 10-8 MG/5ML SUER Take 2.5 ml at bedtime as needed for cough   ipratropium (ATROVENT) 0.02 % nebulizer solution Inhale into the lungs.   levofloxacin (LEVAQUIN) 250 MG tablet Take one  tab a day for chest infection   predniSONE (DELTASONE) 10 MG tablet Take one tab 3 x day for 3 days, then take one tab 2 x a day for 3 days and then take one tab a day for 3 days for copd   No facility-administered encounter medications on file as of 01/25/2020.    Surgical History: Past Surgical History:  Procedure Laterality Date   BREAST CYST ASPIRATION Left 20+ yrs ago   CARDIOVERSION N/A 11/11/2018   Procedure: CARDIOVERSION;  Surgeon: Nelva Bush, MD;  Location: [facility] ORS;  Service: Cardiovascular;  Laterality: N/A;   CATARACT EXTRACTION Bilateral 2005,2009   cyst removal-right shoulder  1972   otosclerosis Left 1988   hardening of bone, other 1989 redone   THYROIDECTOMY  1970   nodule and 1/2 bridge removal   WRIST SURGERY Left 11/29/2009    Medical History: Past Medical History:  Diagnosis Date   Arthritis    Asthma    Basal cell carcinoma 08/02/2014   R check ant to earlobe    Cancer ([place])    skin   Depression    DVT (deep venous thrombosis) ([place])    Hyperlipidemia    Hypertension    Phlebitis    Squamous cell carcinoma of skin 10/19/2014   right cheek    Family History: Family History  Problem Relation Age of Onset   Osteoarthritis Mother    Depression Father    Atrial fibrillation Sister    Multiple myeloma Brother    Cancer Sister    Hodgkin's lymphoma Brother    Prostate cancer Brother    Breast cancer Neg Hx     Social History   Socioeconomic History   Marital status: Widowed    Spouse name: Not on file   Number of children: Not on file   Years of education: Not on file   Highest education level: Not on file  Occupational History   Not on file  Tobacco Use   Smoking status: Never Smoker   Smokeless tobacco: Never Used  Vaping Use   Vaping Use: Never used  Substance and Sexual Activity   Alcohol use: No   Drug use: No   Sexual activity: Never  Other Topics Concern   Not on file  Social  History Narrative   Not on file   Social Determinants of Health   Financial Resource Strain:    Difficulty of Paying Living Expenses: Not on file  Food Insecurity:    Worried About [place] in the Last Year: Not on file   Ran Out of Food in the Last Year: Not on file  Transportation Needs:    Lack of Transportation (Medical): Not on file   Lack of Transportation (Non-Medical): Not on file  Physical Activity:  Days of Exercise per Week: Not on file   Minutes of Exercise per Session: Not on file  Stress:    Feeling of Stress : Not on file  Social Connections:    Frequency of Communication with Friends and Family: Not on file   Frequency of Social Gatherings with Friends and Family: Not on file   Attends Religious Services: Not on file   Active Member of Clubs or Organizations: Not on file   Attends Banker Meetings: Not on file   Marital Status: Not on file  Intimate Partner Violence:    Fear of Current or Ex-Partner: Not on file   Emotionally Abused: Not on file   Physically Abused: Not on file   Sexually Abused: Not on file      Review of Systems  Constitutional: Negative for chills, diaphoresis and fatigue.  HENT: Negative for ear pain, postnasal drip and sinus pressure.   Eyes: Negative for photophobia, discharge, redness, itching and visual disturbance.  Respiratory: Positive for cough and shortness of breath. Negative for wheezing.   Cardiovascular: Negative for chest pain, palpitations and leg swelling.  Gastrointestinal: Negative for abdominal pain, constipation, diarrhea, nausea and vomiting.  Genitourinary: Negative for dysuria and flank pain.  Musculoskeletal: Negative for arthralgias, back pain, gait problem and neck pain.  Skin: Negative for color change.  Allergic/Immunologic: Negative for environmental allergies and food allergies.  Neurological: Negative for dizziness and headaches.  Hematological: Does not  bruise/bleed easily.  Psychiatric/Behavioral: Negative for agitation, behavioral problems (depression) and hallucinations.    Vital Signs: BP (!) 160/80    Pulse 81    Temp 98.1 F (36.7 C)    Resp 16    Ht 5\' 2"  (1.575 m)    Wt 133 lb (60.3 kg)    SpO2 96% Comment: 1 liter oxygen sitting, walking 3 liters, 2 liters at night   BMI 24.33 kg/m    Physical Exam Constitutional:      General: She is not in acute distress.    Appearance: She is well-developed. She is not diaphoretic.  HENT:     Head: Normocephalic and atraumatic.     Mouth/Throat:     Pharynx: No oropharyngeal exudate.  Eyes:     Pupils: Pupils are equal, round, and reactive to light.  Neck:     Thyroid: No thyromegaly.     Vascular: No JVD.     Trachea: No tracheal deviation.  Cardiovascular:     Rate and Rhythm: Normal rate and regular rhythm.     Heart sounds: Normal heart sounds. No murmur heard.  No friction rub. No gallop.   Pulmonary:     Effort: Pulmonary effort is normal. No respiratory distress.     Breath sounds: Wheezing present. No rales.  Chest:     Chest wall: No tenderness.  Abdominal:     General: Bowel sounds are normal.     Palpations: Abdomen is soft.  Musculoskeletal:        General: Normal range of motion.     Cervical back: Normal range of motion and neck supple.  Lymphadenopathy:     Cervical: No cervical adenopathy.  Skin:    General: Skin is warm and dry.  Neurological:     Mental Status: She is alert and oriented to person, place, and time.     Cranial Nerves: No cranial nerve deficit.  Psychiatric:        Behavior: Behavior normal.        Thought  Content: Thought content normal.        Judgment: Judgment normal.    Assessment/Plan: 1. Moderate persistent asthma with acute exacerbation Her asthma is not ell controlled, she was instructed to use her neb treatment regularly. Follow up with pulmonary as before, RX for Levaquin is give to keep with her fr any emergency sickness,  low dose prednisone taper and cough suppressant as prescribed today   2. Pulmonary fibrosis ([place]) Continue on O2 as before 1-3 L [place]   3. Atherosclerosis of aorta (H[facility]) Pt is on Statin, Crestor 5 mg qd    4. Permanent atrial fibrillation ([place]) Followed by cardiology   5. Essential (primary) hypertension Controlled with meds   General Counseling: Tamma verbalizes understanding of the findings of todays visit and agrees with plan of treatment. I have discussed any further diagnostic evaluation that may be needed or ordered today. We also reviewed her medications today. she has been encouraged to call the office with any questions or concerns that should arise related to todays visit.    Orders Placed This Encounter  Procedures   6 minute walk    Meds ordered this encounter  Medications   predniSONE (DELTASONE) 10 MG tablet    Sig: Take one tab 3 x day for 3 days, then take one tab 2 x a day for 3 days and then take one tab a day for 3 days for copd    Dispense:  18 tablet    Refill:  0   chlorpheniramine-HYDROcodone (TUSSIONEX PENNKINETIC ER) 10-8 MG/5ML SUER    Sig: Take 2.5 ml at bedtime as needed for cough    Dispense:  140 mL    Refill:  0   levofloxacin (LEVAQUIN) 250 MG tablet    Sig: Take one tab a day for chest infection    Dispense:  20 tablet    Refill:  0    Total time spent:30 Minutes Time spent includes review of chart, medications, test results, and follow up plan with the patient.      Dr Lavera Guise Internal medicine

## 2020-01-28 MED ORDER — LEVOFLOXACIN 250 MG PO TABS
ORAL_TABLET | ORAL | 0 refills | Status: DC
Start: 2020-01-28 — End: 2020-05-23

## 2020-01-31 ENCOUNTER — Encounter: Payer: Medicare Other | Admitting: Dermatology

## 2020-01-31 DIAGNOSIS — M810 Age-related osteoporosis without current pathological fracture: Secondary | ICD-10-CM | POA: Diagnosis not present

## 2020-01-31 DIAGNOSIS — E559 Vitamin D deficiency, unspecified: Secondary | ICD-10-CM | POA: Diagnosis not present

## 2020-02-14 DIAGNOSIS — Z23 Encounter for immunization: Secondary | ICD-10-CM | POA: Diagnosis not present

## 2020-02-15 ENCOUNTER — Telehealth: Payer: Self-pay

## 2020-02-15 NOTE — Telephone Encounter (Signed)
[place] Patient notes and copies of 6 min walk for o2 recert. Ashley Weiss

## 2020-02-16 ENCOUNTER — Telehealth: Payer: Self-pay

## 2020-02-16 NOTE — Telephone Encounter (Signed)
Spoke with [facility] for re cert on oxygen and faxed 6 min walk and put copies in their box so pt should not receive anymore calls for recert. Mccauley Diehl

## 2020-02-18 DIAGNOSIS — H6123 Impacted cerumen, bilateral: Secondary | ICD-10-CM | POA: Diagnosis not present

## 2020-02-18 DIAGNOSIS — H903 Sensorineural hearing loss, bilateral: Secondary | ICD-10-CM | POA: Diagnosis not present

## 2020-02-18 DIAGNOSIS — H6063 Unspecified chronic otitis externa, bilateral: Secondary | ICD-10-CM | POA: Diagnosis not present

## 2020-02-22 IMAGING — DX CHEST  1 VIEW
1 series · 1 of 1 positions shown · non-contrast
Comparison: None.

CLINICAL DATA: Follow-up pleural effusion

EXAM:
CHEST  1 VIEW

[chest ap]
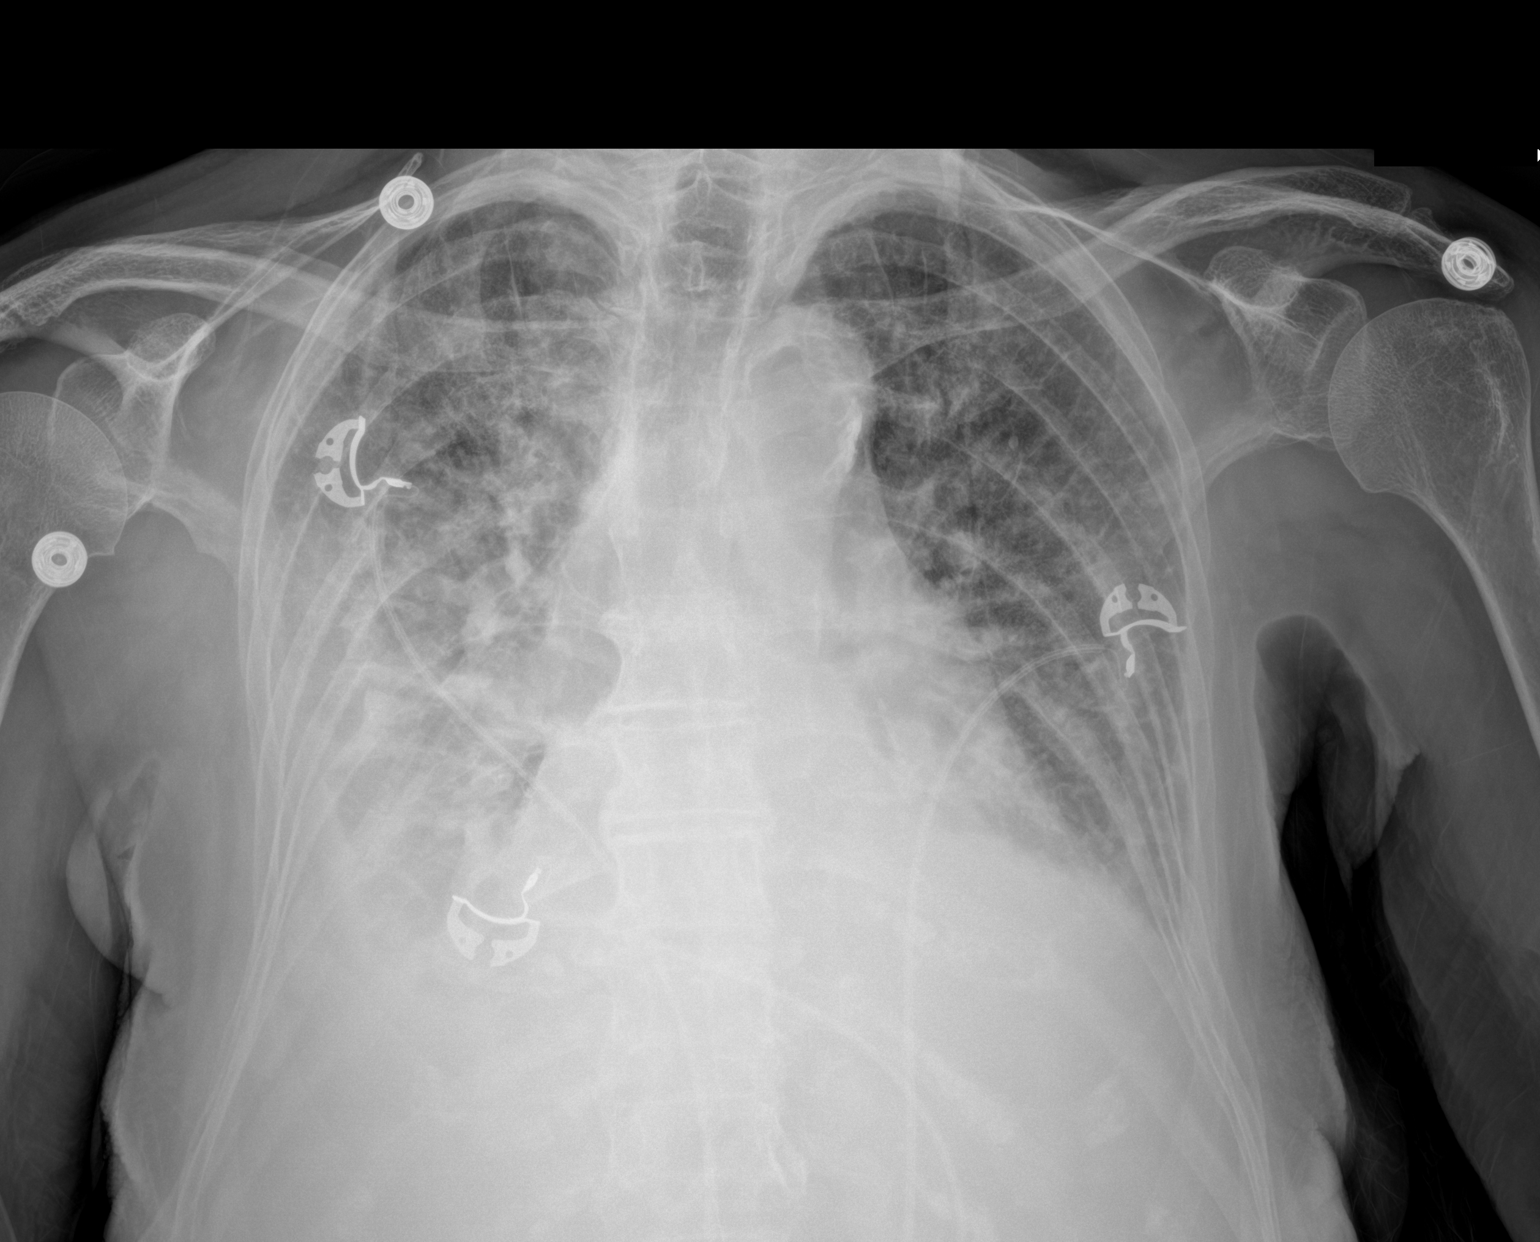

[1 of 1 positions shown; findings below may reference images not displayed]

FINDINGS: Heart size is mildly enlarged. Chronic aortic atherosclerosis.
Pulmonary venous hypertension with interstitial and alveolar edema
persists. Persistent effusions, right larger than left, with volume
loss at lung bases.
IMPRESSION: Persisting congestive heart failure pattern with edema, effusions
and volume loss.

## 2020-03-07 IMAGING — CR CHEST - 2 VIEW
1 series · 2 of 2 positions shown · non-contrast
Comparison: 11/14/2018

CLINICAL DATA: Bibasilar crackles. Occasional nonproductive cough.

EXAM:
CHEST - 2 VIEW

[Series 1: w chest lat · 0.14mm/px · 2 of 2 slices shown]
[im 1/2]
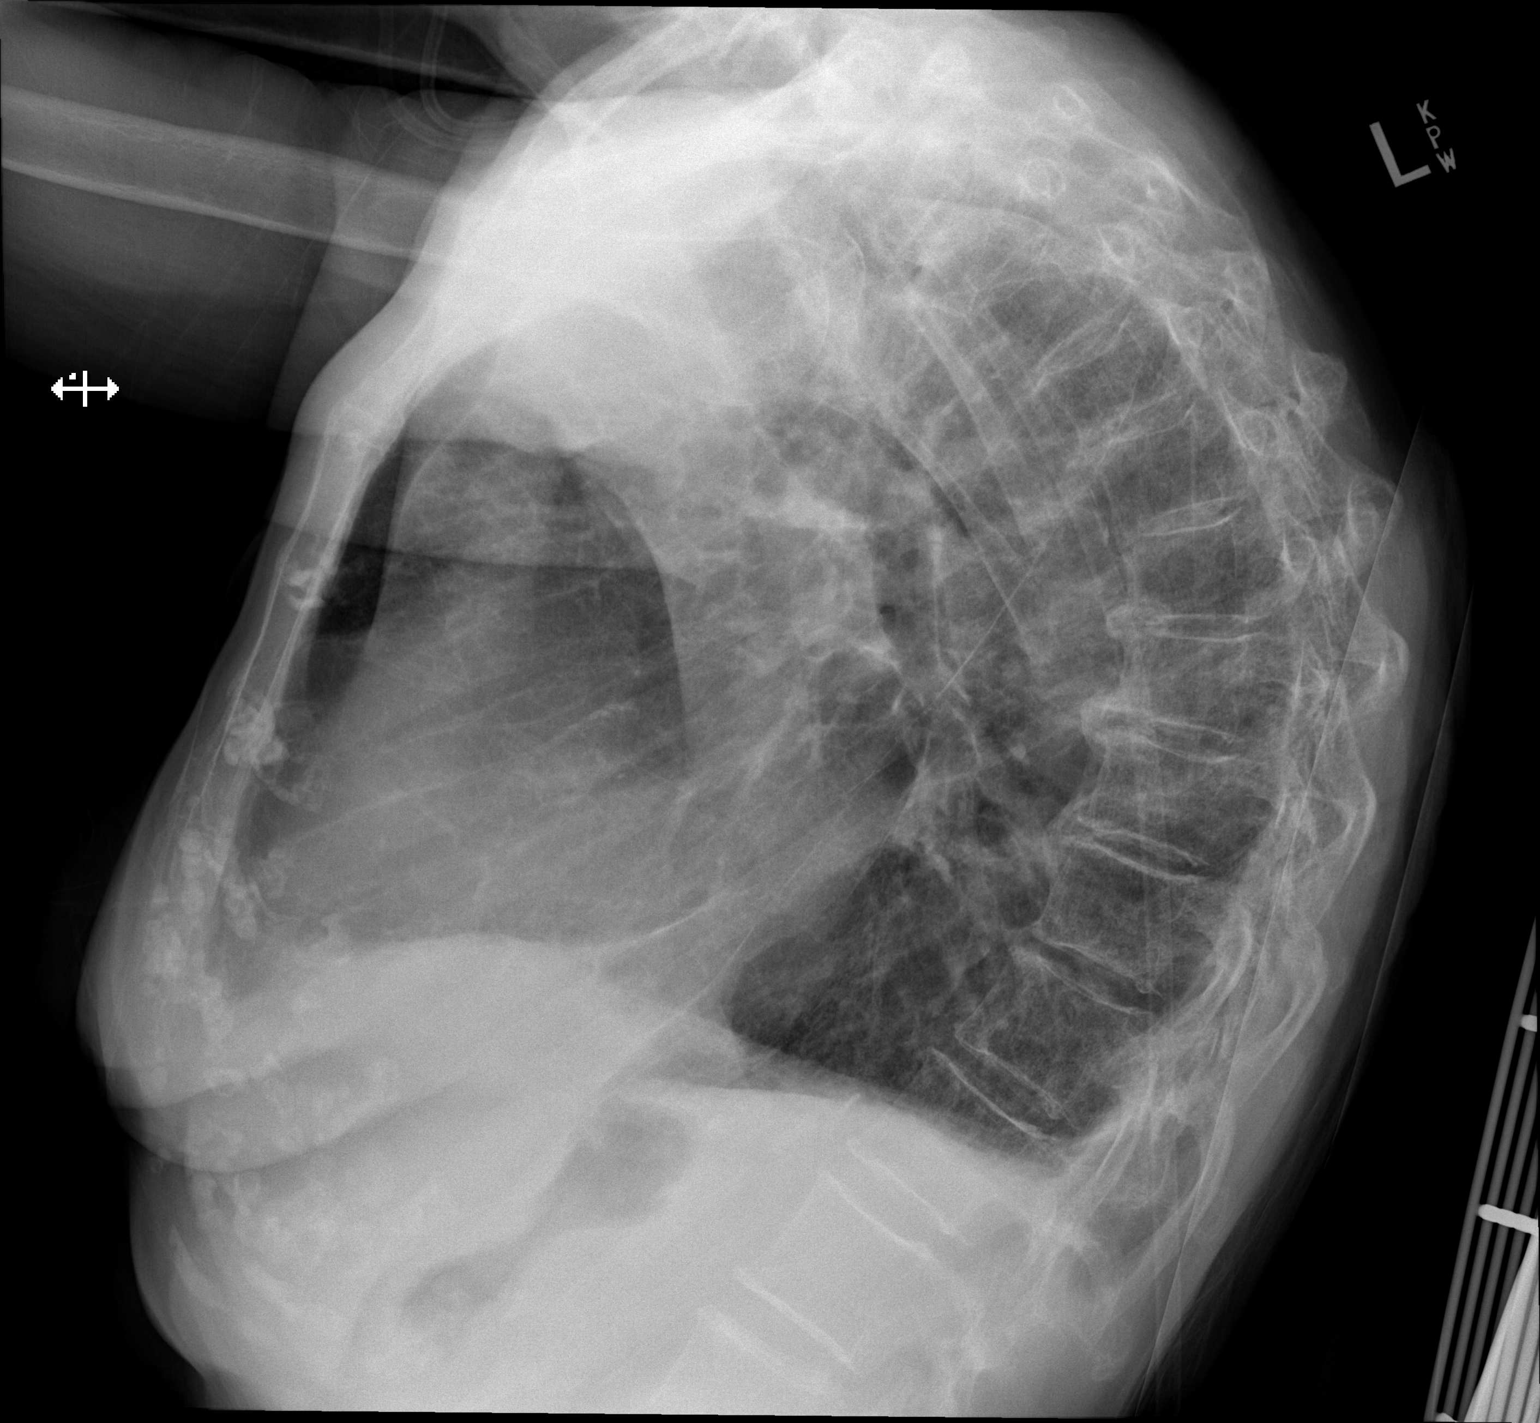
[im 2/2]
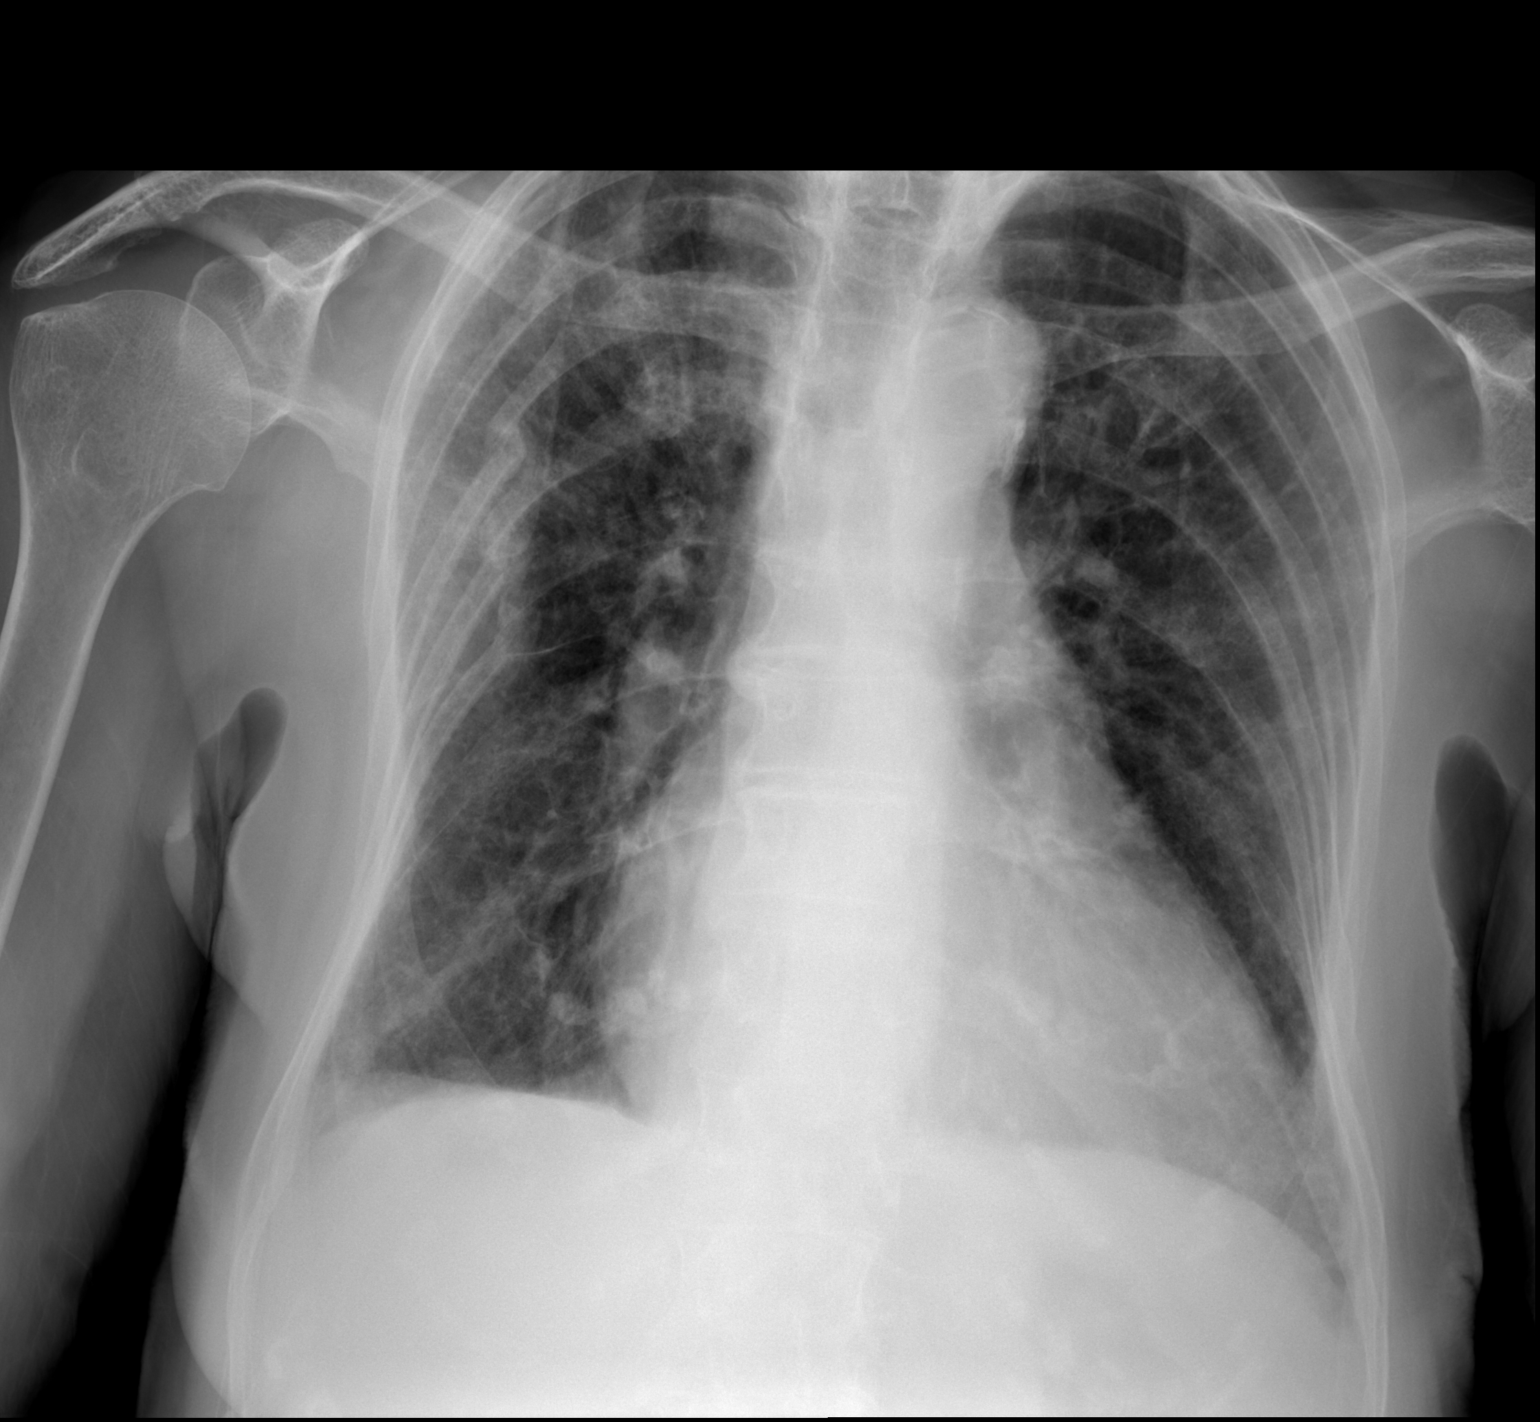

[2 of 2 positions shown; findings below may reference images not displayed]

FINDINGS: Chronic cardiomegaly. Pulmonary vascularity is normal. Lungs are
clear. Multiple old right posterior rib fractures. No acute bone
abnormality. Aortic atherosclerosis.

No effusions.
IMPRESSION: 1. No acute cardiopulmonary disease. Chronic cardiomegaly.
2. Aortic atherosclerosis.

## 2020-03-13 ENCOUNTER — Other Ambulatory Visit: Payer: Self-pay

## 2020-03-13 MED ORDER — LEVOTHYROXINE SODIUM 25 MCG PO TABS: ORAL_TABLET | ORAL | 1 refills | Status: DC

## 2020-03-13 MED ORDER — SERTRALINE HCL 100 MG PO TABS: 100.0000 mg | ORAL_TABLET | Freq: Every day | ORAL | 2 refills | Status: DC

## 2020-03-22 ENCOUNTER — Other Ambulatory Visit: Payer: Self-pay | Admitting: Cardiovascular Disease

## 2020-03-23 ENCOUNTER — Encounter: Payer: Self-pay | Admitting: Internal Medicine

## 2020-03-23 ENCOUNTER — Other Ambulatory Visit: Payer: Self-pay

## 2020-03-23 ENCOUNTER — Ambulatory Visit (INDEPENDENT_AMBULATORY_CARE_PROVIDER_SITE_OTHER): Payer: Medicare Other | Admitting: Internal Medicine

## 2020-03-23 VITALS — BP 128/80 | HR 88 | Temp 98.0°F | Resp 16 | Ht 62.0 in | Wt 135.4 lb

## 2020-03-23 DIAGNOSIS — R0602 Shortness of breath: Secondary | ICD-10-CM

## 2020-03-23 DIAGNOSIS — I480 Paroxysmal atrial fibrillation: Secondary | ICD-10-CM

## 2020-03-23 DIAGNOSIS — J4520 Mild intermittent asthma, uncomplicated: Secondary | ICD-10-CM

## 2020-03-23 DIAGNOSIS — I5032 Chronic diastolic (congestive) heart failure: Secondary | ICD-10-CM | POA: Diagnosis not present

## 2020-03-23 DIAGNOSIS — Z9981 Dependence on supplemental oxygen: Secondary | ICD-10-CM | POA: Diagnosis not present

## 2020-03-23 MED ORDER — FLUTICASONE PROPIONATE 50 MCG/ACT NA SUSP: NASAL | 3 refills | Status: DC

## 2020-03-23 NOTE — Progress Notes (Signed)
[facility] [place],  [address]  Pulmonary Sleep Medicine   Office Visit Note  Patient Name: Ashley Weiss DOB: February 09, 1920 MRN [id number]  Date of Service: 03/23/2020  Complaints/HPI: Amiodarone toxicity. She is off the amiodarone rate has been controlled. Her breathing is doing better. She has not been in the hospital. She did have a cough a few weeks ago. Now doing better. She was given a short course of prednisone. Patient is on oxygen and continues to use it.  ROS  General: (-) fever, (-) chills, (-) night sweats, (-) weakness Skin: (-) rashes, (-) itching,. Eyes: (-) visual changes, (-) redness, (-) itching. Nose and Sinuses: (-) nasal stuffiness or itchiness, (-) postnasal drip, (-) nosebleeds, (-) sinus trouble. Mouth and Throat: (-) sore throat, (-) hoarseness. Neck: (-) swollen glands, (-) enlarged thyroid, (-) neck pain. Respiratory: - cough, (-) bloody sputum, + shortness of breath, - wheezing. Cardiovascular: - ankle swelling, (-) chest pain. Lymphatic: (-) lymph node enlargement. Neurologic: (-) numbness, (-) tingling. Psychiatric: (-) anxiety, (-) depression   Current Medication: Outpatient Encounter Medications as of 03/23/2020  Medication Sig  . albuterol (VENTOLIN HFA) 108 (90 Base) MCG/ACT inhaler Inhale 2 puffs into the lungs every 6 (six) hours as needed for wheezing or shortness of breath.  Marland Kitchen aspirin EC 81 MG tablet Take 1 tablet (81 mg total) by mouth daily.  . Blood Glucose Monitoring Suppl (FREESTYLE FREEDOM LITE) w/Device KIT 1 kit by Does not apply route daily.  . chlorpheniramine-HYDROcodone (TUSSIONEX PENNKINETIC ER) 10-8 MG/5ML SUER Take 2.5 ml at bedtime as needed for cough  . denosumab (PROLIA) 60 MG/ML SOSY injection Inject 60 mg into the skin every 6 (six) months.  . diltiazem (CARDIZEM CD) 120 MG 24 hr capsule TAKE 1 CAPSULE BY MOUTH EVERY DAY  . FLOVENT HFA 110 MCG/ACT inhaler TAKE 2 PUFFS BY MOUTH TWICE A  DAY  . Fluocinolone Acetonide 0.01 % OIL Place 4 drops in ear(s) at bedtime as needed.   . fluticasone (FLONASE) 50 MCG/ACT nasal spray PLACE 1 SPRAY INTO BOTH NOSTRILS DAILY.  . furosemide (LASIX) 40 MG tablet Take 1 tablet (40 mg) by mouth once daily as needed for weight gain, increased shortness of breath, or swelling  . glucose blood (FREESTYLE LITE) test strip Use as directed once a daily Diag E11.65  . guaiFENesin (MUCINEX) 600 MG 12 hr tablet Take by mouth 2 (two) times daily.  . hydrocortisone (ANUSOL-HC) 25 MG suppository Insert one after EACH bm QD for 7 days and then prn  . ipratropium-albuterol (DUONEB) 0.5-2.5 (3) MG/3ML SOLN USE 1 VIAL 3 TIMES DAILY AS NEEDED FOR SHORTNESS OF BREATH DX J44.9  . Lancets (FREESTYLE) lancets Use as directed once daily diag e11.65  . levofloxacin (LEVAQUIN) 250 MG tablet Take one tab a day for chest infection  . levothyroxine (SYNTHROID) 25 MCG tablet Take 1 daily by mouth in the morning on a empty stomach  . metoprolol tartrate (LOPRESSOR) 100 MG tablet TAKE 1 TABLETS BY MOUTH TWICE DAILY  . metoprolol tartrate (LOPRESSOR) 25 MG tablet TAKE 1 TABLET (25 MG TOTAL) BY MOUTH DAILY AS NEEDED (FOR A FIB).  Marland Kitchen montelukast (SINGULAIR) 10 MG tablet TAKE 1 TABLET BY MOUTH EVERY DAY FOR COUGH  . Multiple Vitamins-Minerals (MULTIVITAMIN WITH MINERALS) tablet Take 1 tablet by mouth daily.  . OXYGEN Inhale 2 L into the lungs every evening. Sleep with nasal cannula w/ O2 at 2lpm.  . potassium chloride (KLOR-CON M10) 10 MEQ tablet  TAKE 1 TABLET BY MOUTH EVERY DAY PRN ON DAYS WHEN TAKING LASIX  . predniSONE (DELTASONE) 10 MG tablet Take one tab 3 x day for 3 days, then take one tab 2 x a day for 3 days and then take one tab a day for 3 days for copd  . Respiratory Therapy Supplies (ONE FLOW SPIROMETER) DEVI Please use device as tolerated and according to instruction provided.  . rosuvastatin (CRESTOR) 5 MG tablet Take 1 tablet (5 mg total) by mouth daily at 6 PM.  .  sertraline (ZOLOFT) 100 MG tablet Take 1 tablet (100 mg total) by mouth daily.  . vitamin C (ASCORBIC ACID) 500 MG tablet Take 500 mg by mouth daily.  . [DISCONTINUED] fluticasone (FLONASE) 50 MCG/ACT nasal spray PLACE 1 SPRAY INTO BOTH NOSTRILS DAILY.  . [DISCONTINUED] ELIQUIS 2.5 MG TABS tablet Take 2.5 mg by mouth 2 (two) times daily. (Patient not taking: Reported on 03/23/2020)  . [DISCONTINUED] HYDROcodone-acetaminophen (NORCO/VICODIN) 5-325 MG tablet Take 1 tablet by mouth every 6 (six) hours as needed. (Patient not taking: Reported on 03/23/2020)   No facility-administered encounter medications on file as of 03/23/2020.    Surgical History: Past Surgical History:  Procedure Laterality Date  . BREAST CYST ASPIRATION Left 20+ yrs ago  . CARDIOVERSION N/A 11/11/2018   Procedure: CARDIOVERSION;  Surgeon: Nelva Bush, MD;  Location: [facility] ORS;  Service: Cardiovascular;  Laterality: N/A;  . CATARACT EXTRACTION Bilateral [id number]  . cyst removal-right shoulder  1972  . otosclerosis Left 1988   hardening of bone, other 1989 redone  . THYROIDECTOMY  1970   nodule and 1/2 bridge removal  . WRIST SURGERY Left 11/29/2009    Medical History: Past Medical History:  Diagnosis Date  . Arthritis   . Asthma   . Basal cell carcinoma 08/02/2014   R check ant to earlobe   . Cancer ([place])    skin  . Depression   . DVT (deep venous thrombosis) ([place])   . Hyperlipidemia   . Hypertension   . Phlebitis   . Squamous cell carcinoma of skin 10/19/2014   right cheek    Family History: Family History  Problem Relation Age of Onset  . Osteoarthritis Mother   . Depression Father   . Atrial fibrillation Sister   . Multiple myeloma Brother   . Cancer Sister   . Hodgkin's lymphoma Brother   . Prostate cancer Brother   . Breast cancer Neg Hx     Social History: Social History   Socioeconomic History  . Marital status: Widowed    Spouse name: Not on file  . Number of children: Not on  file  . Years of education: Not on file  . Highest education level: Not on file  Occupational History  . Not on file  Tobacco Use  . Smoking status: Never Smoker  . Smokeless tobacco: Never Used  Vaping Use  . Vaping Use: Never used  Substance and Sexual Activity  . Alcohol use: No  . Drug use: No  . Sexual activity: Never  Other Topics Concern  . Not on file  Social History Narrative  . Not on file   Social Determinants of Health   Financial Resource Strain:   . Difficulty of Paying Living Expenses: Not on file  Food Insecurity:   . Worried About [place] fund[place] in the Last Year: Not on file  . Ran Out of Food in the Last Year: Not on file  Transportation Needs:   .  Lack of Transportation (Medical): Not on file  . Lack of Transportation (Non-Medical): Not on file  Physical Activity:   . Days of Exercise per Week: Not on file  . Minutes of Exercise per Session: Not on file  Stress:   . Feeling of Stress : Not on file  Social Connections:   . Frequency of Communication with Friends and Family: Not on file  . Frequency of Social Gatherings with Friends and Family: Not on file  . Attends Religious Services: Not on file  . Active Member of Clubs or Organizations: Not on file  . Attends Archivist Meetings: Not on file  . Marital Status: Not on file  Intimate Partner Violence:   . Fear of Current or Ex-Partner: Not on file  . Emotionally Abused: Not on file  . Physically Abused: Not on file  . Sexually Abused: Not on file    Vital Signs: Blood pressure 128/80, pulse 88, temperature 98 F (36.7 C), resp. rate 16, height _0  (1.575 m), weight 135 lb 6.4 oz (61.4 kg), SpO2 95 %.  Examination: General Appearance: The patient is well-developed, well-nourished, and in no distress. Skin: Gross inspection of skin unremarkable. Head: normocephalic, no gross deformities. Eyes: no gross deformities noted. ENT: ears appear grossly normal no exudates. Neck:  Supple. No thyromegaly. No LAD. Respiratory: few crackles. Cardiovascular: Normal S1 and S2 without murmur or rub. Extremities: No cyanosis. pulses are equal. Neurologic: Alert and oriented. No involuntary movements.  LABS: No results found for this or any previous visit (from the past 2160 hour(s)).  Radiology: DG Chest 2 View  Result Date: 12/20/2019 CLINICAL DATA:  Cough EXAM: CHEST - 2 VIEW COMPARISON:  09/23/2019 FINDINGS: Chronic interstitial changes. Stable mild elevation of the right hemidiaphragm. No new consolidation or edema. No pleural effusion or pneumothorax. Stable cardiomediastinal contours. No acute osseous abnormality. IMPRESSION: No acute process in the chest. Electronically Signed   By: Macy Mis M.D.   On: 12/20/2019 16:32    No results found.  No results found.    Assessment and Plan: Patient Active Problem List   Diagnosis Date Noted  . Atherosclerosis of aorta ([place]) 01/25/2020  . Blood in stool 12/23/2019  . Constipation 12/23/2019  . Acute bacterial sinusitis 10/30/2019  . Acute heart failure with preserved ejection fraction (HFpEF) ([place])   . Shortness of breath 10/21/2018  . Atrial fibrillation with rapid ventricular response ([place]) 10/10/2018  . Atrial fibrillation with RVR ([place]) 10/10/2018  . Nocturnal hypoxemia due to asthma 10/29/2017  . Acute pain of right wrist 09/30/2017  . Hyperlipidemia 07/23/2017  . Osteoporosis, post-menopausal 07/03/2017  . Pain in left knee 05/28/2017  . Essential (primary) hypertension 05/28/2017  . Cardiac arrhythmia 05/28/2017  . Hypothyroidism 05/28/2017  . Injury of right hip 06/15/2015  . Mild intermittent asthma without complication 18/84/1660    1. Pulmonary fibrosisadvanced disease but has been stable 2. Atrial fib rate controlled has been on diltiazem for control 3. MILD asthma inhalers as prescribed 4. CHF preserved EF following with cardiology  General Counseling: I have discussed the findings of  the evaluation and examination with Ashley Weiss.  I have also discussed any further diagnostic evaluation thatmay be needed or ordered today. Ashley Weiss verbalizes understanding of the findings of todays visit. We also reviewed her medications today and discussed drug interactions and side effects including but not limited excessive drowsiness and altered mental states. We also discussed that there is always a risk not just to her but also  people around her. she has been encouraged to call the office with any questions or concerns that should arise related to todays visit.  No orders of the defined types were placed in this encounter.    Time spent: 37mn  I have personally obtained a history, examined the patient, evaluated laboratory and imaging results, formulated the assessment and plan and placed orders.    SAllyne Gee MD [facility]er[facility] LakePulmonary and Critical Care Sleep medicine

## 2020-03-23 NOTE — Patient Instructions (Signed)
Heart Failure and Exercise Heart failure is a condition in which the heart does not fill or pump enough blood and oxygen to support your body and its functions. Heart failure is a long-term (chronic) condition. Living with heart failure can be challenging. However, following your health care provider's instructions about a healthy lifestyle may help improve your symptoms. This includes choosing the right exercise plan. Doing daily physical activity is important after a diagnosis of heart failure. You may have some activity restrictions, so talk to your health care provider before doing any exercises. What are the benefits of exercise? Exercise may:  Make your heart muscles stronger.  Lower your blood pressure.  Lower your cholesterol.  Help you lose weight.  Help your bones stay strong.  Improve your blood circulation.  Help your body use oxygen better. This relieves symptoms such as fatigue and shortness of breath.  Help your mental health by lowering the risk of depression and other problems.  Improve your quality of life.  Decrease your chance of hospital admission for heart failure. What is an exercise plan? An exercise plan is a set of specific exercises and training activities. You will work with your health care provider to create the exercise plan that works for you. The plan may include:  Different types of exercises and how to do them.  Cardiac rehabilitation exercises. These are supervised programs that are designed to strengthen your heart. What are strengthening exercises? Strengthening exercises are a type of physical activity that involves using resistance to improve your muscle strength. Strengthening exercises usually have repetitive motions. These types of exercises can include:  Lifting weights.  Using weight machines.  Using resistance tubes and bands.  Using kettlebells.  Using your body weight, such as doing push-ups or squats. What are balance  exercises? Balance exercises are another type of physical activity. They strengthen the muscles of the back, stomach, and pelvis (core muscles) and improve your balance. They can also lower your risk of falling. These types of exercises can include:  Standing on one leg.  Walking backward, sideways, and in a straight line.  Standing up after sitting, without using your hands.  Shifting your weight from one leg to the other.  Lifting one leg in front of you.  Doing tai chi. This is a type of exercise that uses slow movements and deep breathing. How can I increase my flexibility? Having better flexibility can keep you from falling. It can also lengthen your muscles, improve your range of motion, and help your joints. You can increase your flexibility by:  Doing tai chi.  Doing yoga.  Stretching. How much aerobic exercise should I get?  Aerobic exercises strengthen your breathing and circulation system and increase your body's use of oxygen. Examples of aerobic exercise include biking, walking, running, and swimming. Talk to your health care provider to find out how much aerobic exercise is safe for you.  To do these exercises:  Start exercising slowly, limiting the amount of time at first. You may need to start with 5 minutes of aerobic exercise every day.  Slowly add more minutes until you can safely do at least 30 minutes of exercise at least 4 days a week. Summary  Daily physical activity is important after a diagnosis of heart failure.  Exercise can make your heart muscles stronger. It also offers other benefits that will improve your health.  Talk to your health care provider before doing any exercises. This information is not intended to replace advice  given to you by your health care provider. Make sure you discuss any questions you have with your health care provider. Document Revised: 08/23/2016 Document Reviewed: 08/20/2016 Elsevier Patient Education  [place].

## 2020-04-04 ENCOUNTER — Other Ambulatory Visit: Payer: Self-pay

## 2020-04-04 ENCOUNTER — Ambulatory Visit (INDEPENDENT_AMBULATORY_CARE_PROVIDER_SITE_OTHER): Payer: Medicare Other | Admitting: Internal Medicine

## 2020-04-04 ENCOUNTER — Encounter: Payer: Self-pay | Admitting: Internal Medicine

## 2020-04-04 DIAGNOSIS — J4520 Mild intermittent asthma, uncomplicated: Secondary | ICD-10-CM

## 2020-04-04 DIAGNOSIS — I480 Paroxysmal atrial fibrillation: Secondary | ICD-10-CM

## 2020-04-04 DIAGNOSIS — I5030 Unspecified diastolic (congestive) heart failure: Secondary | ICD-10-CM | POA: Diagnosis not present

## 2020-04-04 DIAGNOSIS — Z9981 Dependence on supplemental oxygen: Secondary | ICD-10-CM

## 2020-04-04 DIAGNOSIS — R300 Dysuria: Secondary | ICD-10-CM

## 2020-04-04 DIAGNOSIS — I700 Atherosclerosis of aorta: Secondary | ICD-10-CM

## 2020-04-04 DIAGNOSIS — Z0001 Encounter for general adult medical examination with abnormal findings: Secondary | ICD-10-CM

## 2020-04-04 DIAGNOSIS — I10 Essential (primary) hypertension: Secondary | ICD-10-CM

## 2020-04-04 NOTE — Progress Notes (Signed)
[facility] [place], [place] [address]  Internal MEDICINE  Office Visit Note  Patient Name: Ashley Weiss  [id number]  [id number]  Date of Service: 04/04/2020  Chief Complaint  Patient presents with  . medicare wellness visit     HPI Pt is here for routine health maintenance examination. She is here with her daughter. Feels well with no acute issues, asthma is well controlled. Has been seen by cardiology. Lives with he daughter. Sometimes she bleeds due to her hemorrhoids but lately no problem   Current Medication: Outpatient Encounter Medications as of 04/04/2020  Medication Sig  . albuterol (VENTOLIN HFA) 108 (90 Base) MCG/ACT inhaler Inhale 2 puffs into the lungs every 6 (six) hours as needed for wheezing or shortness of breath.  Marland Kitchen aspirin EC 81 MG tablet Take 1 tablet (81 mg total) by mouth daily.  . Blood Glucose Monitoring Suppl (FREESTYLE FREEDOM LITE) w/Device KIT 1 kit by Does not apply route daily.  . chlorpheniramine-HYDROcodone (TUSSIONEX PENNKINETIC ER) 10-8 MG/5ML SUER Take 2.5 ml at bedtime as needed for cough  . denosumab (PROLIA) 60 MG/ML SOSY injection Inject 60 mg into the skin every 6 (six) months.  . diltiazem (CARDIZEM CD) 120 MG 24 hr capsule TAKE 1 CAPSULE BY MOUTH EVERY DAY  . FLOVENT HFA 110 MCG/ACT inhaler TAKE 2 PUFFS BY MOUTH TWICE A DAY  . Fluocinolone Acetonide 0.01 % OIL Place 4 drops in ear(s) at bedtime as needed.   . fluticasone (FLONASE) 50 MCG/ACT nasal spray PLACE 1 SPRAY INTO BOTH NOSTRILS DAILY.  . furosemide (LASIX) 40 MG tablet Take 1 tablet (40 mg) by mouth once daily as needed for weight gain, increased shortness of breath, or swelling  . glucose blood (FREESTYLE LITE) test strip Use as directed once a daily Diag E11.65  . guaiFENesin (MUCINEX) 600 MG 12 hr tablet Take by mouth 2 (two) times daily.  . hydrocortisone (ANUSOL-HC) 25 MG suppository Insert one after EACH bm QD for 7 days and then prn  .  ipratropium-albuterol (DUONEB) 0.5-2.5 (3) MG/3ML SOLN USE 1 VIAL 3 TIMES DAILY AS NEEDED FOR SHORTNESS OF BREATH DX J44.9  . Lancets (FREESTYLE) lancets Use as directed once daily diag e11.65  . levothyroxine (SYNTHROID) 25 MCG tablet Take 1 daily by mouth in the morning on a empty stomach  . metoprolol tartrate (LOPRESSOR) 100 MG tablet TAKE 1 TABLETS BY MOUTH TWICE DAILY  . metoprolol tartrate (LOPRESSOR) 25 MG tablet TAKE 1 TABLET (25 MG TOTAL) BY MOUTH DAILY AS NEEDED (FOR A FIB).  Marland Kitchen montelukast (SINGULAIR) 10 MG tablet TAKE 1 TABLET BY MOUTH EVERY DAY FOR COUGH  . Multiple Vitamins-Minerals (MULTIVITAMIN WITH MINERALS) tablet Take 1 tablet by mouth daily.  . OXYGEN Inhale 2 L into the lungs every evening. Sleep with nasal cannula w/ O2 at 2lpm.  . potassium chloride (KLOR-CON M10) 10 MEQ tablet TAKE 1 TABLET BY MOUTH EVERY DAY PRN ON DAYS WHEN TAKING LASIX  . Respiratory Therapy Supplies (ONE FLOW SPIROMETER) DEVI Please use device as tolerated and according to instruction provided.  . rosuvastatin (CRESTOR) 5 MG tablet Take 1 tablet (5 mg total) by mouth daily at 6 PM.  . sertraline (ZOLOFT) 100 MG tablet Take 1 tablet (100 mg total) by mouth daily.  . vitamin C (ASCORBIC ACID) 500 MG tablet Take 500 mg by mouth daily.  Marland Kitchen levofloxacin (LEVAQUIN) 250 MG tablet Take one tab a day for chest infection (Patient not taking: Reported on 04/04/2020)  .  predniSONE (DELTASONE) 10 MG tablet Take one tab 3 x day for 3 days, then take one tab 2 x a day for 3 days and then take one tab a day for 3 days for copd (Patient not taking: Reported on 04/04/2020)   No facility-administered encounter medications on file as of 04/04/2020.    Surgical History: Past Surgical History:  Procedure Laterality Date  . BREAST CYST ASPIRATION Left 20+ yrs ago  . CARDIOVERSION N/A 11/11/2018   Procedure: CARDIOVERSION;  Surgeon: Nelva Bush, MD;  Location: [facility] ORS;  Service: Cardiovascular;  Laterality: N/A;  .  CATARACT EXTRACTION Bilateral 4098,1191  . cyst removal-right shoulder  1972  . otosclerosis Left 1988   hardening of bone, other 1989 redone  . THYROIDECTOMY  1970   nodule and 1/2 bridge removal  . WRIST SURGERY Left 11/29/2009    Medical History: Past Medical History:  Diagnosis Date  . Arthritis   . Asthma   . Basal cell carcinoma 08/02/2014   R check ant to earlobe   . Cancer ([place])    skin  . Depression   . DVT (deep venous thrombosis) ([place])   . Hyperlipidemia   . Hypertension   . Phlebitis   . Squamous cell carcinoma of skin 10/19/2014   right cheek    Family History: Family History  Problem Relation Age of Onset  . Osteoarthritis Mother   . Depression Father   . Atrial fibrillation Sister   . Multiple myeloma Brother   . Cancer Sister   . Hodgkin's lymphoma Brother   . Prostate cancer Brother   . Breast cancer Neg Hx       Review of Systems  Constitutional: Negative for chills, diaphoresis and fatigue.  HENT: Negative for ear pain, postnasal drip and sinus pressure.   Eyes: Negative for photophobia, discharge, redness, itching and visual disturbance.  Respiratory: Negative for cough, shortness of breath and wheezing.   Cardiovascular: Negative for chest pain, palpitations and leg swelling.  Gastrointestinal: Negative for abdominal pain, constipation, diarrhea, nausea and vomiting.  Genitourinary: Negative for dysuria and flank pain.  Musculoskeletal: Negative for arthralgias, back pain, gait problem and neck pain.  Skin: Negative for color change.  Allergic/Immunologic: Negative for environmental allergies and food allergies.  Neurological: Negative for dizziness and headaches.  Hematological: Does not bruise/bleed easily.  Psychiatric/Behavioral: Negative for agitation, behavioral problems (depression) and hallucinations.     Vital Signs: BP 130/80   Pulse 72   Temp 97.6 F (36.4 C)   Resp 16   Ht 5' 2"  (1.575 m)   Wt 136 lb 12.8 oz (62.1 kg)    SpO2 100% Comment: 3 liters oxygen  BMI 25.02 kg/m    Physical Exam Constitutional:      General: She is not in acute distress.    Appearance: She is well-developed and well-nourished. She is not diaphoretic.  HENT:     Head: Normocephalic and atraumatic.     Right Ear: Tympanic membrane normal.     Left Ear: Tympanic membrane normal.     Nose: Nose normal.     Mouth/Throat:     Mouth: Oropharynx is clear and moist. Mucous membranes are moist.     Pharynx: No oropharyngeal exudate.  Eyes:     Extraocular Movements: Extraocular movements intact and EOM normal.     Pupils: Pupils are equal, round, and reactive to light.  Neck:     Thyroid: No thyromegaly.     Vascular: No JVD.  Trachea: No tracheal deviation.  Cardiovascular:     Rate and Rhythm: Normal rate and regular rhythm.     Heart sounds: Normal heart sounds. No murmur heard. No friction rub. No gallop.   Pulmonary:     Effort: Pulmonary effort is normal. No respiratory distress.     Breath sounds: No wheezing or rales.  Chest:     Chest wall: No tenderness.  Abdominal:     General: Bowel sounds are normal.     Palpations: Abdomen is soft.  Musculoskeletal:        General: Normal range of motion.     Cervical back: Normal range of motion and neck supple.  Lymphadenopathy:     Cervical: No cervical adenopathy.  Skin:    General: Skin is warm and dry.  Neurological:     Mental Status: She is alert and oriented to person, place, and time.     Cranial Nerves: No cranial nerve deficit.  Psychiatric:        Mood and Affect: Mood and affect normal.        Behavior: Behavior normal.        Thought Content: Thought content normal.        Judgment: Judgment normal.     Assessment/Plan: 1. Encounter for general adult medical examination with abnormal findings Update labs  - CBC with Differential/Platelet; Future - Lipid Panel With LDL/HDL Ratio; Future - TSH; Future - T4, free; Future - Comprehensive  metabolic panel  2. Atherosclerosis of aorta (HCC) Continue Crestor as before   3. Essential (primary) hypertension Controlled with meds   4. Oxygen dependent Continue O2 as before   5. Chronic asthma, mild intermittent, uncomplicated Continue MDI   6. Paroxysmal atrial fibrillation ([place]) Followed by cardiology   7. Diastolic CHF with preserved left ventricular function, NYHA class 2 (HCC) Stable   8. Dysuria UA  General Counseling: Kallee verbalizes understanding of the findings of todays visit and agrees with plan of treatment. I have discussed any further diagnostic evaluation that may be needed or ordered today. We also reviewed her medications today. she has been encouraged to call the office with any questions or concerns that should arise related to todays visit.    Counseling: Cardiac risk factor modification:  1. Control blood pressure. 2. Exercise as prescribed. 3. Follow low sodium, low fat diet. and low fat and low cholestrol diet. 4. Take ASA 35m once a day. 5. Restricted calories diet to lose weight.   Orders Placed This Encounter  Procedures  . UA/M w/rflx Culture, Routine  . CBC with Differential/Platelet  . Lipid Panel With LDL/HDL Ratio  . TSH  . T4, free  . Comprehensive metabolic panel    No orders of the defined types were placed in this encounter.   Total time spent:35Minutes  Time spent includes review of chart, medications, test results, and follow up plan with the patient.     FLavera Guise MD  Internal Medicine

## 2020-04-06 LAB — UA/M W/RFLX CULTURE, ROUTINE

## 2020-04-07 ENCOUNTER — Other Ambulatory Visit: Payer: Self-pay

## 2020-04-07 MED ORDER — POTASSIUM CHLORIDE CRYS ER 10 MEQ PO TBCR: EXTENDED_RELEASE_TABLET | ORAL | 0 refills | Status: DC

## 2020-04-07 MED ORDER — FUROSEMIDE 40 MG PO TABS: ORAL_TABLET | ORAL | 0 refills | Status: DC

## 2020-04-11 DIAGNOSIS — H6122 Impacted cerumen, left ear: Secondary | ICD-10-CM | POA: Diagnosis not present

## 2020-04-11 DIAGNOSIS — H6062 Unspecified chronic otitis externa, left ear: Secondary | ICD-10-CM | POA: Diagnosis not present

## 2020-04-23 ENCOUNTER — Other Ambulatory Visit: Payer: Self-pay | Admitting: Internal Medicine

## 2020-05-12 ENCOUNTER — Other Ambulatory Visit: Payer: Self-pay | Admitting: Cardiovascular Disease

## 2020-05-23 ENCOUNTER — Telehealth (INDEPENDENT_AMBULATORY_CARE_PROVIDER_SITE_OTHER): Payer: Medicare Other | Admitting: Cardiovascular Disease

## 2020-05-23 ENCOUNTER — Other Ambulatory Visit: Payer: Self-pay

## 2020-05-23 ENCOUNTER — Encounter: Payer: Self-pay | Admitting: Cardiovascular Disease

## 2020-05-23 VITALS — BP 164/90 | HR 82 | Ht 62.0 in | Wt 134.1 lb

## 2020-05-23 DIAGNOSIS — I10 Essential (primary) hypertension: Secondary | ICD-10-CM | POA: Diagnosis not present

## 2020-05-23 DIAGNOSIS — I5032 Chronic diastolic (congestive) heart failure: Secondary | ICD-10-CM | POA: Diagnosis not present

## 2020-05-23 DIAGNOSIS — I4819 Other persistent atrial fibrillation: Secondary | ICD-10-CM | POA: Diagnosis not present

## 2020-05-23 MED ORDER — DILTIAZEM HCL ER COATED BEADS 120 MG PO CP24: 120.0000 mg | ORAL_CAPSULE | Freq: Every day | ORAL | 2 refills | Status: DC

## 2020-05-23 MED ORDER — ROSUVASTATIN CALCIUM 5 MG PO TABS: 5.0000 mg | ORAL_TABLET | Freq: Every day | ORAL | 2 refills | Status: DC

## 2020-05-23 NOTE — Patient Instructions (Signed)
Medication Instructions:  Your physician recommends that you continue on your current medications as directed. Please refer to the Current Medication list given to you today.  *If you need a refill on your cardiac medications before your next appointment, please call your pharmacy*   Lab Work: None ordered If you have labs (blood work) drawn today and your tests are completely normal, you will receive your results only by: . MyChart Message (if you have MyChart) OR . A paper copy in the mail If you have any lab test that is abnormal or we need to change your treatment, we will call you to review the results.   Testing/Procedures: None ordered   Follow-Up: At [facility], you and your health needs are our priority.  As part of our continuing mission to provide you with exceptional heart care, we have created designated Provider Care Teams.  These Care Teams include your primary Cardiologist (physician) and Advanced Practice Providers (APPs -  Physician Assistants and Nurse Practitioners) who all work together to provide you with the care you need, when you need it.  We recommend signing up for the patient portal called "MyChart".  Sign up information is provided on this After Visit Summary.  MyChart is used to connect with patients for Virtual Visits (Telemedicine).  Patients are able to view lab/test results, encounter notes, upcoming appointments, etc.  Non-urgent messages can be sent to your provider as well.   To learn more about what you can do with MyChart, go to [email or web address].    Your next appointment:   4 month(s)  The format for your next appointment:   In Person  Provider:   You may see Muhammad Arida, MD or one of the following Advanced Practice Providers on your designated Care Team:    Christopher Berge, NP  Ryan Dunn, PA-C  Jacquelyn Visser, PA-C  Cadence Furth, PA-C  Caitlin Walker, NP    Other Instructions N/A  

## 2020-05-23 NOTE — Progress Notes (Signed)
Virtual Visit via Video Note   This visit type was conducted due to national recommendations for restrictions regarding the COVID-19 Pandemic (e.g. social distancing) in an effort to limit this patient's exposure and mitigate transmission in our community.  Due to her co-morbid illnesses, this patient is at least at moderate risk for complications without adequate follow up.  This format is felt to be most appropriate for this patient at this time.  All issues noted in this document were discussed and addressed.  A limited physical exam was performed with this format.  Please refer to the patient's chart for her consent to telehealth for [facility].  Video Connection Lost Video connection was lost at > 50% of the duration of this visit, at which time the remainder of the visit was completed via audio only.      Date:  05/23/2020   ID:  Ashley Weiss, DOB [birth year], MRN [id number] The patient was identified using 2 identifiers.  Patient Location: Home Provider Location: Office/Clinic  PCP:  Lavera Guise, MD  Cardiologist:  Kathlyn Sacramento, MD  Electrophysiologist:  None   Evaluation Performed:  Follow-Up Visit  Chief Complaint: Doing well with no complaints  History of Present Illness:    Ashley Weiss is a 87 y.o. female who was seen via virtual visit for follow-up regarding atrial fibrillation and chronic diastolic heart failure.  We started as a video visit but we had problems with audio and thus I had to switch to a phone visit.  She has history of hyperlipidemia, essential hypertension, hyperlipidemia and persistent atrial fibrillation.   Rate control was very difficult  and ultimately she underwent successful cardioversion in July, 2020 after starting amiodarone. Echocardiogram in July,2020 showed an EF of 55 to 60% with moderately dilated left atrium, moderate pulmonary hypertension and moderate tricuspid regurgitation. Amiodarone was stopped in January of 2021 due to  concerns about lung toxicity requiring oxygen therapy.  Chest x-ray showed interstitial lung disease. Pulmonary function testing showed severe restrictive lung disease with severely decreased DLCO. She had recurrent falls resulting in physical injuries and thus it was decided to stop anticoagulation.   She is doing well from a cardiac standpoint with no recurrent palpitations.  No chest pain.  She has chronic exertional dyspnea with no recent worsening.   She is staying home most of the time to avoid exposure to Covid.   The patient does not have symptoms concerning for COVID-19 infection (fever, chills, cough, or new shortness of breath).    Past Medical History:  Diagnosis Date  . Arthritis   . Asthma   . Basal cell carcinoma 08/02/2014   R check ant to earlobe   . Cancer ([place])    skin  . Depression   . DVT (deep venous thrombosis) ([place])   . Hyperlipidemia   . Hypertension   . Phlebitis   . Squamous cell carcinoma of skin 10/19/2014   right cheek   Past Surgical History:  Procedure Laterality Date  . BREAST CYST ASPIRATION Left 20+ yrs ago  . CARDIOVERSION N/A 11/11/2018   Procedure: CARDIOVERSION;  Surgeon: Nelva Bush, MD;  Location: [facility] ORS;  Service: Cardiovascular;  Laterality: N/A;  . CATARACT EXTRACTION Bilateral 4098,1191  . cyst removal-right shoulder  1972  . otosclerosis Left 1988   hardening of bone, other 1989 redone  . THYROIDECTOMY  1970   nodule and 1/2 bridge removal  . WRIST SURGERY Left 11/29/2009     Current Meds  Medication  Sig  . albuterol (VENTOLIN HFA) 108 (90 Base) MCG/ACT inhaler Inhale 2 puffs into the lungs every 6 (six) hours as needed for wheezing or shortness of breath.  Marland Kitchen aspirin EC 81 MG tablet Take 1 tablet (81 mg total) by mouth daily.  . Blood Glucose Monitoring Suppl (FREESTYLE FREEDOM LITE) w/Device KIT 1 kit by Does not apply route daily.  Marland Kitchen denosumab (PROLIA) 60 MG/ML SOSY injection Inject 60 mg into the skin every 6 (six)  months.  . diltiazem (CARDIZEM CD) 120 MG 24 hr capsule TAKE 1 CAPSULE BY MOUTH EVERY DAY  . FLOVENT HFA 110 MCG/ACT inhaler TAKE 2 PUFFS BY MOUTH TWICE A DAY  . Fluocinolone Acetonide 0.01 % OIL Place 4 drops in ear(s) at bedtime as needed.   . fluticasone (FLONASE) 50 MCG/ACT nasal spray PLACE 1 SPRAY INTO BOTH NOSTRILS DAILY.  . furosemide (LASIX) 40 MG tablet Take 1 tablet (40 mg) by mouth once daily as needed for weight gain, increased shortness of breath, or swelling  . glucose blood (FREESTYLE LITE) test strip Use as directed once a daily Diag E11.65  . guaiFENesin (MUCINEX) 600 MG 12 hr tablet Take by mouth 2 (two) times daily.  . hydrocortisone (ANUSOL-HC) 25 MG suppository Insert one after EACH bm QD for 7 days and then prn  . ipratropium-albuterol (DUONEB) 0.5-2.5 (3) MG/3ML SOLN USE 1 VIAL 3 TIMES DAILY AS NEEDED FOR SHORTNESS OF BREATH DX J44.9  . Lancets (FREESTYLE) lancets Use as directed once daily diag e11.65  . levothyroxine (SYNTHROID) 25 MCG tablet Take 1 daily by mouth in the morning on a empty stomach  . metoprolol tartrate (LOPRESSOR) 100 MG tablet TAKE 1 TABLETS BY MOUTH TWICE DAILY  . metoprolol tartrate (LOPRESSOR) 25 MG tablet TAKE 1 TABLET (25 MG TOTAL) BY MOUTH DAILY AS NEEDED (FOR A FIB).  Marland Kitchen montelukast (SINGULAIR) 10 MG tablet TAKE 1 TABLET BY MOUTH EVERY DAY FOR COUGH  . Multiple Vitamins-Minerals (MULTIVITAMIN WITH MINERALS) tablet Take 1 tablet by mouth daily.  Marland Kitchen neomycin-polymyxin-hydrocortisone (CORTISPORIN) 3.5-10000-1 OTIC suspension LOCATION  LEFT EAR. PLACE 4 DROPS IN LEFT EAR TWICE DAILY X 5 DAYS  . OXYGEN Inhale 2 L into the lungs every evening. Sleep with nasal cannula w/ O2 at 2lpm.  . potassium chloride (KLOR-CON M10) 10 MEQ tablet TAKE 1 TABLET BY MOUTH EVERY DAY PRN ON DAYS WHEN TAKING LASIX  . Respiratory Therapy Supplies (ONE FLOW SPIROMETER) DEVI Please use device as tolerated and according to instruction provided.  . rosuvastatin (CRESTOR) 5 MG  tablet TAKE 1 TABLET (5 MG TOTAL) BY MOUTH DAILY AT 6 PM.  . sertraline (ZOLOFT) 100 MG tablet Take 1 tablet (100 mg total) by mouth daily.  . vitamin C (ASCORBIC ACID) 500 MG tablet Take 500 mg by mouth daily.     Allergies:   Hydralazine, Biaxin [clarithromycin], Ciprofloxacin, Meloxicam, and Minocycline   Social History   Tobacco Use  . Smoking status: Never Smoker  . Smokeless tobacco: Never Used  Vaping Use  . Vaping Use: Never used  Substance Use Topics  . Alcohol use: No  . Drug use: No     Family Hx: The patient's family history includes Atrial fibrillation in her sister; Cancer in her sister; Depression in her father; Hodgkin's lymphoma in her brother; Multiple myeloma in her brother; Osteoarthritis in her mother; Prostate cancer in her brother. There is no history of Breast cancer.  ROS:   Please see the history of present illness.  All other systems reviewed and are negative.   Prior CV studies:   The following studies were reviewed today:    Labs/Other Tests and Data Reviewed:    EKG:  No ECG reviewed.  Recent Labs: No results found for requested labs within last 8760 hours.   Recent Lipid Panel Lab Results  Component Value Date/Time   CHOL 79 11/11/2018 03:17 AM   CHOL 119 10/09/2018 08:48 AM   TRIG 34 11/11/2018 03:17 AM   HDL 30 (L) 11/11/2018 03:17 AM   HDL 38 (L) 10/09/2018 08:48 AM   CHOLHDL 2.6 11/11/2018 03:17 AM   LDLCALC 42 11/11/2018 03:17 AM   LDLCALC 57 10/09/2018 08:48 AM    Wt Readings from Last 3 Encounters:  05/23/20 134 lb 2 oz (60.8 kg)  04/04/20 136 lb 12.8 oz (62.1 kg)  03/23/20 135 lb 6.4 oz (61.4 kg)     Risk Assessment/Calculations:      Objective:    Vital Signs:  BP (!) 164/90   Pulse 82   Ht 5' 2"  (1.575 m)   Wt 134 lb 2 oz (60.8 kg)   BMI 24.53 kg/m    VITAL SIGNS:  reviewed  ASSESSMENT & PLAN:    1.  Persistent atrial fibrillation/flutter: Amiodarone was discontinued last year due to concerns about  lung toxicity.  She is maintaining in sinus rhythm with metoprolol and diltiazem.  She is no longer on anticoagulation due to recurrent falls.    2.  Essential hypertension: Blood pressure is mildly elevated but will monitor for now.  3.  Chronic diastolic heart failure she appears to be euvolemic.  This was only in the setting of atrial fibrillation.  She has not required furosemide lately although she does have it at home to be used as needed.        COVID-19 Education: The signs and symptoms of COVID-19 were discussed with the patient and how to seek care for testing (follow up with PCP or arrange E-visit).  The importance of social distancing was discussed today.  Time:   Today, I have spent 8 minutes with the patient with telehealth technology discussing the above problems.     Medication Adjustments/Labs and Tests Ordered: Current medicines are reviewed at length with the patient today.  Concerns regarding medicines are outlined above.   Tests Ordered: No orders of the defined types were placed in this encounter.   Medication Changes: No orders of the defined types were placed in this encounter.   Follow Up:  In Person in 4 month(s)  Signed, Kathlyn Sacramento, MD  05/23/2020 2:22 PM    [facility]

## 2020-06-18 ENCOUNTER — Other Ambulatory Visit: Payer: Self-pay | Admitting: Cardiovascular Disease

## 2020-06-20 DIAGNOSIS — H903 Sensorineural hearing loss, bilateral: Secondary | ICD-10-CM | POA: Diagnosis not present

## 2020-06-20 DIAGNOSIS — H6063 Unspecified chronic otitis externa, bilateral: Secondary | ICD-10-CM | POA: Diagnosis not present

## 2020-06-20 DIAGNOSIS — J4520 Mild intermittent asthma, uncomplicated: Secondary | ICD-10-CM | POA: Diagnosis not present

## 2020-06-20 DIAGNOSIS — H6123 Impacted cerumen, bilateral: Secondary | ICD-10-CM | POA: Diagnosis not present

## 2020-06-20 DIAGNOSIS — I480 Paroxysmal atrial fibrillation: Secondary | ICD-10-CM | POA: Diagnosis not present

## 2020-06-20 DIAGNOSIS — Z9981 Dependence on supplemental oxygen: Secondary | ICD-10-CM | POA: Diagnosis not present

## 2020-06-20 DIAGNOSIS — I10 Essential (primary) hypertension: Secondary | ICD-10-CM | POA: Diagnosis not present

## 2020-06-20 DIAGNOSIS — I700 Atherosclerosis of aorta: Secondary | ICD-10-CM | POA: Diagnosis not present

## 2020-06-20 DIAGNOSIS — R300 Dysuria: Secondary | ICD-10-CM | POA: Diagnosis not present

## 2020-06-20 DIAGNOSIS — Z0001 Encounter for general adult medical examination with abnormal findings: Secondary | ICD-10-CM | POA: Diagnosis not present

## 2020-06-20 DIAGNOSIS — I5030 Unspecified diastolic (congestive) heart failure: Secondary | ICD-10-CM | POA: Diagnosis not present

## 2020-06-21 ENCOUNTER — Telehealth: Payer: Self-pay

## 2020-06-21 LAB — COMPREHENSIVE METABOLIC PANEL
ALT: 24 IU/L (ref 0–32)
AST: 27 IU/L (ref 0–40)
Albumin/Globulin Ratio: 0.9 — ABNORMAL LOW (ref 1.2–2.2)
Albumin: 3.8 g/dL (ref 3.5–4.6)
Alkaline Phosphatase: 64 IU/L (ref 44–121)
BUN/Creatinine Ratio: 31 — ABNORMAL HIGH (ref 12–28)
BUN: 20 mg/dL (ref 10–36)
Bilirubin Total: 0.3 mg/dL (ref 0.0–1.2)
CO2: 30 mmol/L — ABNORMAL HIGH (ref 20–29)
Calcium: 9.6 mg/dL (ref 8.7–10.3)
Chloride: 97 mmol/L (ref 96–106)
Creatinine, Ser: 0.65 mg/dL (ref 0.57–1.00)
Globulin, Total: 4.2 g/dL (ref 1.5–4.5)
Glucose: 153 mg/dL — ABNORMAL HIGH (ref 65–99)
Potassium: 4.2 mmol/L (ref 3.5–5.2)
Sodium: 143 mmol/L (ref 134–144)
Total Protein: 8 g/dL (ref 6.0–8.5)
eGFR: 80 mL/min/{1.73_m2} (ref >59)

## 2020-06-21 NOTE — Progress Notes (Signed)
Please advise her daughter that her glucose is slightly elevated, was it a fasting sample??

## 2020-06-21 NOTE — Telephone Encounter (Signed)
-----   Message from Lavera Guise, MD sent at 06/21/2020  9:50 AM EST ----- Please advise her daughter that her glucose is slightly elevated, was it a fasting sample??

## 2020-06-22 NOTE — Telephone Encounter (Signed)
Labs will be discuss on next visit

## 2020-06-29 ENCOUNTER — Other Ambulatory Visit: Payer: Self-pay | Admitting: Internal Medicine

## 2020-06-29 NOTE — Progress Notes (Signed)
LMOM for daughter to call back, and confirm if sample was fasting

## 2020-06-30 ENCOUNTER — Other Ambulatory Visit: Payer: Self-pay

## 2020-06-30 DIAGNOSIS — E119 Type 2 diabetes mellitus without complications: Secondary | ICD-10-CM

## 2020-06-30 MED ORDER — FREESTYLE LITE TEST VI STRP: ORAL_STRIP | 1 refills | Status: DC

## 2020-06-30 NOTE — Telephone Encounter (Signed)
Pt daughter notified   

## 2020-07-21 DIAGNOSIS — E039 Hypothyroidism, unspecified: Secondary | ICD-10-CM | POA: Diagnosis not present

## 2020-07-21 DIAGNOSIS — I4891 Unspecified atrial fibrillation: Secondary | ICD-10-CM | POA: Diagnosis not present

## 2020-07-21 DIAGNOSIS — M810 Age-related osteoporosis without current pathological fracture: Secondary | ICD-10-CM | POA: Diagnosis not present

## 2020-07-21 DIAGNOSIS — Z9181 History of falling: Secondary | ICD-10-CM | POA: Diagnosis not present

## 2020-07-21 DIAGNOSIS — J449 Chronic obstructive pulmonary disease, unspecified: Secondary | ICD-10-CM | POA: Diagnosis not present

## 2020-07-21 DIAGNOSIS — I509 Heart failure, unspecified: Secondary | ICD-10-CM | POA: Diagnosis not present

## 2020-08-04 DIAGNOSIS — M810 Age-related osteoporosis without current pathological fracture: Secondary | ICD-10-CM | POA: Diagnosis not present

## 2020-08-07 IMAGING — CR DG CHEST 2V
1 series · 2 of 2 positions shown · non-contrast
Comparison: Chest radiographs 11/26/2018 and earlier.

CLINICAL DATA: [AGE] female with recent congestive heart
failure. On home oxygen.

EXAM:
CHEST - 2 VIEW

[Series 1: dg chest 2 view · 0.14mm/px · 2 of 2 slices shown]
[im 1/2]
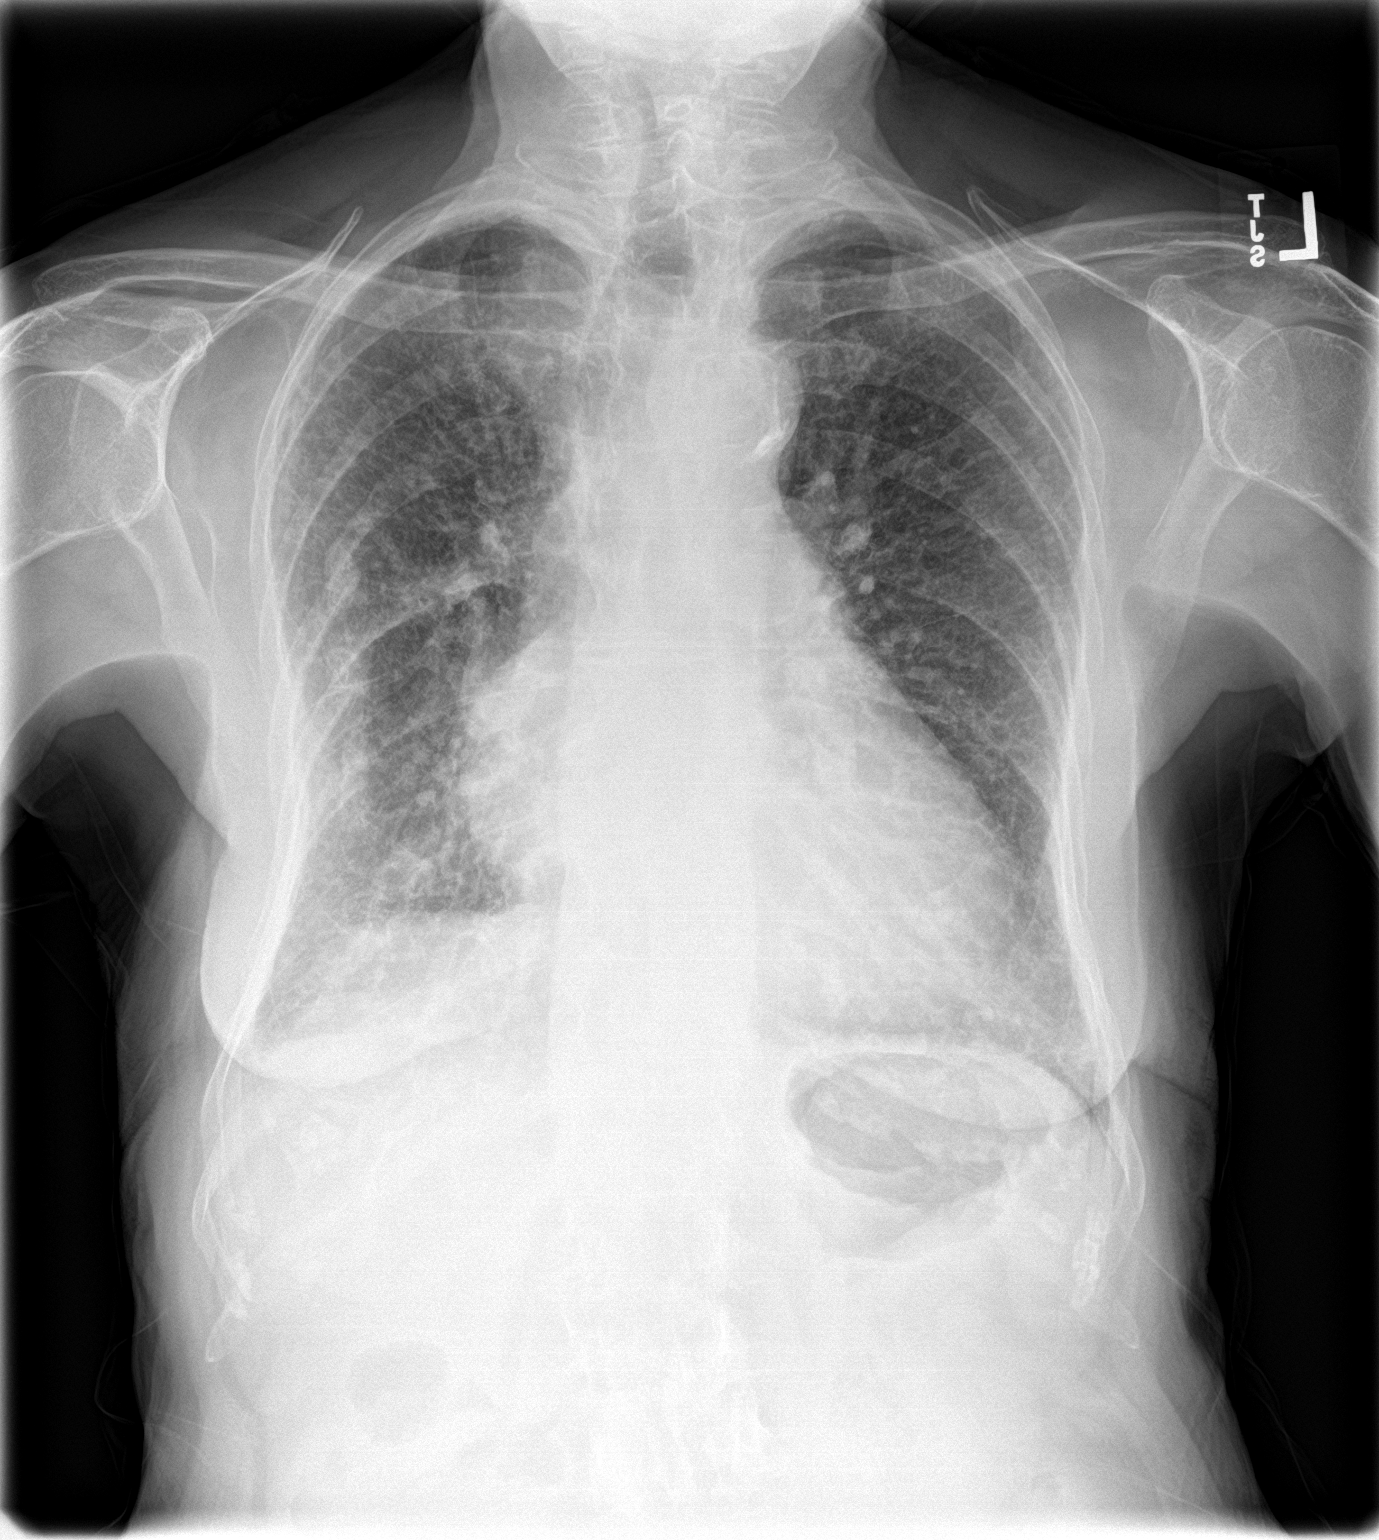
[im 2/2]
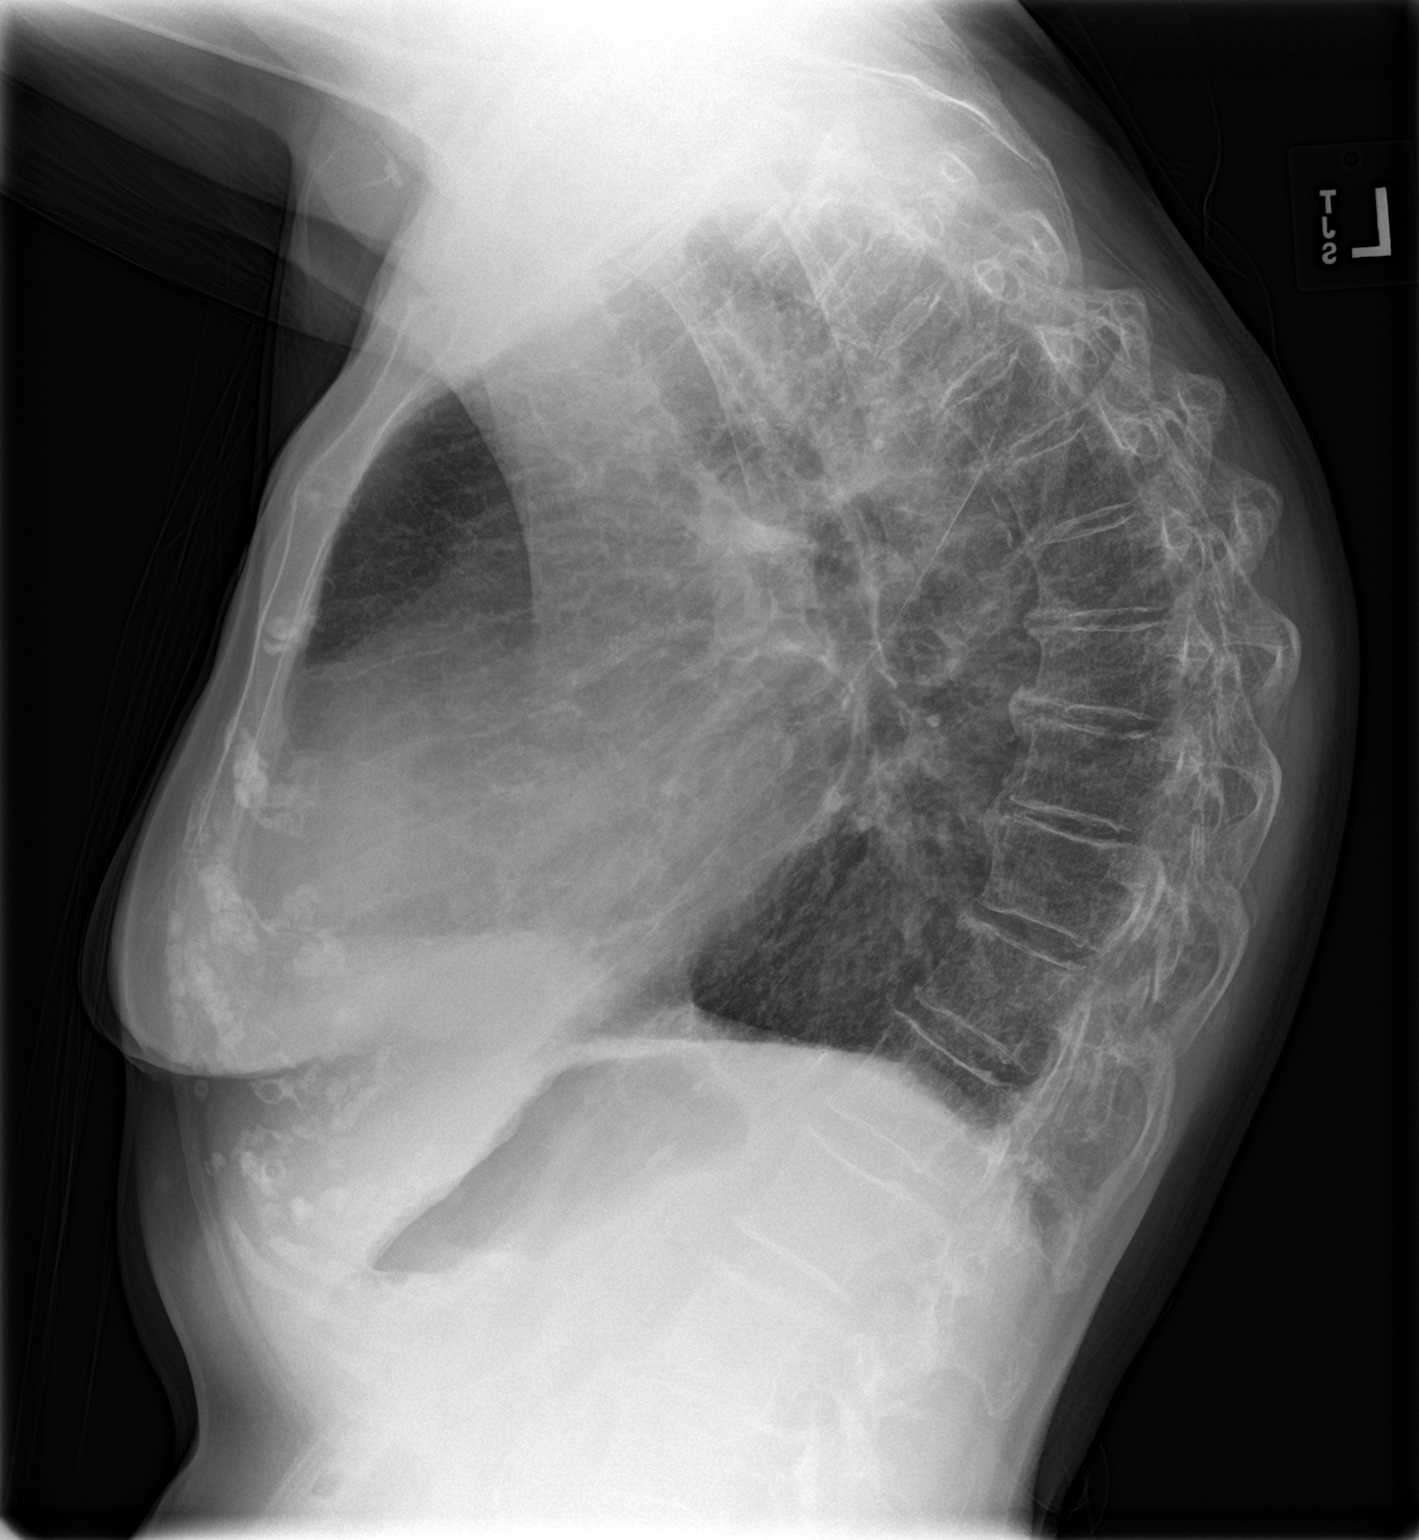

[2 of 2 positions shown; findings below may reference images not displayed]

FINDINGS: Frontal and lateral views. Stable cardiomegaly and mediastinal
contours. Chronically large lung volumes with diffuse increased
pulmonary interstitial opacity since [REDACTED]. No superimposed
pneumothorax or pleural effusion. No confluent pulmonary opacity.

Chronic right rib fractures. Exaggerated thoracic kyphosis with
osteopenia. No acute osseous abnormality identified. Negative
visible bowel gas pattern.
IMPRESSION: Cardiomegaly and large lung volumes with diffuse increased pulmonary
interstitial opacity since [REDACTED].

No pleural fluid identified.

Top differential considerations include pulmonary interstitial edema
and viral/atypical respiratory infection.

## 2020-08-13 ENCOUNTER — Encounter: Payer: Self-pay | Admitting: Internal Medicine

## 2020-08-22 DIAGNOSIS — H903 Sensorineural hearing loss, bilateral: Secondary | ICD-10-CM | POA: Diagnosis not present

## 2020-08-22 DIAGNOSIS — H6063 Unspecified chronic otitis externa, bilateral: Secondary | ICD-10-CM | POA: Diagnosis not present

## 2020-08-22 DIAGNOSIS — H6123 Impacted cerumen, bilateral: Secondary | ICD-10-CM | POA: Diagnosis not present

## 2020-09-21 ENCOUNTER — Encounter: Payer: Self-pay | Admitting: Physician Assistant

## 2020-09-21 ENCOUNTER — Ambulatory Visit (INDEPENDENT_AMBULATORY_CARE_PROVIDER_SITE_OTHER): Payer: Medicare Other | Admitting: Physician Assistant

## 2020-09-21 ENCOUNTER — Other Ambulatory Visit: Payer: Self-pay

## 2020-09-21 DIAGNOSIS — Z9981 Dependence on supplemental oxygen: Secondary | ICD-10-CM | POA: Diagnosis not present

## 2020-09-21 DIAGNOSIS — E119 Type 2 diabetes mellitus without complications: Secondary | ICD-10-CM | POA: Diagnosis not present

## 2020-09-21 DIAGNOSIS — I5032 Chronic diastolic (congestive) heart failure: Secondary | ICD-10-CM

## 2020-09-21 DIAGNOSIS — J8410 Pulmonary fibrosis, unspecified: Secondary | ICD-10-CM | POA: Diagnosis not present

## 2020-09-21 DIAGNOSIS — J449 Chronic obstructive pulmonary disease, unspecified: Secondary | ICD-10-CM

## 2020-09-21 DIAGNOSIS — J4520 Mild intermittent asthma, uncomplicated: Secondary | ICD-10-CM

## 2020-09-21 MED ORDER — FREESTYLE LITE TEST VI STRP: ORAL_STRIP | 1 refills | Status: DC

## 2020-09-21 MED ORDER — FLOVENT HFA 110 MCG/ACT IN AERO: INHALATION_SPRAY | RESPIRATORY_TRACT | 3 refills | Status: DC

## 2020-09-21 NOTE — Progress Notes (Signed)
[facility] [place], [place] [address]  Pulmonary Sleep Medicine   Office Visit Note  Patient Name: Ashley Weiss DOB: 1919/08/11 MRN [id number]  Date of Service: 09/24/2020  Complaints/HPI: Patient is here for routine pulmonary visit with her daughter.  She is currently using her Flovent BID, no rescue inhaler needed. States she is doing well. Is on 1 L portable oxygen during the day, unless walking when she will turn up to 3 L. She is on 2 L at night. Followed by cardiology and sees them on Monday. She lives with her daughter who agrees things have been going well.  She has a PCP follow-up next week.  ROS  General: (-) fever, (-) chills, (-) night sweats, (-) weakness Skin: (-) rashes, (-) itching,. Eyes: (-) visual changes, (-) redness, (-) itching. Nose and Sinuses: (-) nasal stuffiness or itchiness, (-) postnasal drip, (-) nosebleeds, (-) sinus trouble. Mouth and Throat: (-) sore throat, (-) hoarseness. Neck: (-) swollen glands, (-) enlarged thyroid, (-) neck pain. Respiratory: - cough, (-) bloody sputum, + shortness of breath, - wheezing. Cardiovascular: - ankle swelling, (-) chest pain. Lymphatic: (-) lymph node enlargement. Neurologic: (-) numbness, (-) tingling. Psychiatric: (-) anxiety, (-) depression   Current Medication: Outpatient Encounter Medications as of 09/21/2020  Medication Sig  . albuterol (VENTOLIN HFA) 108 (90 Base) MCG/ACT inhaler Inhale 2 puffs into the lungs every 6 (six) hours as needed for wheezing or shortness of breath.  Marland Kitchen aspirin EC 81 MG tablet Take 1 tablet (81 mg total) by mouth daily.  . Blood Glucose Monitoring Suppl (FREESTYLE FREEDOM LITE) w/Device KIT 1 kit by Does not apply route daily.  Marland Kitchen denosumab (PROLIA) 60 MG/ML SOSY injection Inject 60 mg into the skin every 6 (six) months.  . Fluocinolone Acetonide 0.01 % OIL Place 4 drops in ear(s) at bedtime as needed.   . fluticasone (FLONASE) 50 MCG/ACT nasal spray  PLACE 1 SPRAY INTO BOTH NOSTRILS DAILY.  . fluticasone (FLOVENT HFA) 110 MCG/ACT inhaler TAKE 2 PUFFS BY MOUTH TWICE A DAY  . glucose blood (FREESTYLE LITE) test strip Use as directed once a daily Diag E11.65  . guaiFENesin (MUCINEX) 600 MG 12 hr tablet Take by mouth 2 (two) times daily.  . hydrocortisone (ANUSOL-HC) 25 MG suppository Insert one after EACH bm QD for 7 days and then prn  . ipratropium-albuterol (DUONEB) 0.5-2.5 (3) MG/3ML SOLN USE 1 VIAL 3 TIMES DAILY AS NEEDED FOR SHORTNESS OF BREATH DX J44.9  . Lancets (FREESTYLE) lancets Use as directed once daily diag e11.65  . levothyroxine (SYNTHROID) 25 MCG tablet Take 1 daily by mouth in the morning on a empty stomach  . metoprolol tartrate (LOPRESSOR) 25 MG tablet TAKE 1 TABLET (25 MG TOTAL) BY MOUTH DAILY AS NEEDED (FOR A FIB).  . Multiple Vitamins-Minerals (MULTIVITAMIN WITH MINERALS) tablet Take 1 tablet by mouth daily.  . OXYGEN Inhale 2 L into the lungs every evening. Sleep with nasal cannula w/ O2 at 2lpm.  . Respiratory Therapy Supplies (ONE FLOW SPIROMETER) DEVI Please use device as tolerated and according to instruction provided.  . sertraline (ZOLOFT) 100 MG tablet Take 1 tablet (100 mg total) by mouth daily.  . vitamin C (ASCORBIC ACID) 500 MG tablet Take 500 mg by mouth daily.  . [DISCONTINUED] chlorpheniramine-HYDROcodone (TUSSIONEX PENNKINETIC ER) 10-8 MG/5ML SUER Take 2.5 ml at bedtime as needed for cough (Patient not taking: No sig reported)  . [DISCONTINUED] diltiazem (CARDIZEM CD) 120 MG 24 hr capsule TAKE  1 CAPSULE BY MOUTH EVERY DAY  . [DISCONTINUED] FLOVENT HFA 110 MCG/ACT inhaler TAKE 2 PUFFS BY MOUTH TWICE A DAY  . [DISCONTINUED] furosemide (LASIX) 40 MG tablet Take 1 tablet (40 mg) by mouth once daily as needed for weight gain, increased shortness of breath, or swelling  . [DISCONTINUED] glucose blood (FREESTYLE LITE) test strip Use as directed once a daily Diag E11.65  . [DISCONTINUED] levofloxacin (LEVAQUIN) 250  MG tablet Take one tab a day for chest infection (Patient not taking: No sig reported)  . [DISCONTINUED] metoprolol tartrate (LOPRESSOR) 100 MG tablet TAKE 1 TABLETS BY MOUTH TWICE DAILY  . [DISCONTINUED] montelukast (SINGULAIR) 10 MG tablet TAKE 1 TABLET BY MOUTH EVERY DAY FOR COUGH  . [DISCONTINUED] potassium chloride (KLOR-CON M10) 10 MEQ tablet TAKE 1 TABLET BY MOUTH EVERY DAY PRN ON DAYS WHEN TAKING LASIX  . [DISCONTINUED] predniSONE (DELTASONE) 10 MG tablet Take one tab 3 x day for 3 days, then take one tab 2 x a day for 3 days and then take one tab a day for 3 days for copd (Patient not taking: No sig reported)  . [DISCONTINUED] rosuvastatin (CRESTOR) 5 MG tablet Take 1 tablet (5 mg total) by mouth daily at 6 PM.   No facility-administered encounter medications on file as of 09/21/2020.    Surgical History: Past Surgical History:  Procedure Laterality Date  . BREAST CYST ASPIRATION Left 20+ yrs ago  . CARDIOVERSION N/A 11/11/2018   Procedure: CARDIOVERSION;  Surgeon: Yvonne Kendall, MD;  Location: [facility] ORS;  Service: Cardiovascular;  Laterality: N/A;  . CATARACT EXTRACTION Bilateral [id number]  . cyst removal-right shoulder  1972  . otosclerosis Left 1988   hardening of bone, other 1989 redone  . THYROIDECTOMY  1970   nodule and 1/2 bridge removal  . WRIST SURGERY Left 11/29/2009    Medical History: Past Medical History:  Diagnosis Date  . Arthritis   . Asthma   . Basal cell carcinoma 08/02/2014   R check ant to earlobe   . Cancer (HCC)    skin  . Depression   . DVT (deep venous thrombosis) (HCC)   . Hyperlipidemia   . Hypertension   . Phlebitis   . Squamous cell carcinoma of skin 10/19/2014   right cheek    Family History: Family History  Problem Relation Age of Onset  . Osteoarthritis Mother   . Depression Father   . Atrial fibrillation Sister   . Multiple myeloma Brother   . Cancer Sister   . Hodgkin's lymphoma Brother   . Prostate cancer Brother   .  Breast cancer Neg Hx     Social History: Social History   Socioeconomic History  . Marital status: Widowed    Spouse name: Not on file  . Number of children: Not on file  . Years of education: Not on file  . Highest education level: Not on file  Occupational History  . Not on file  Tobacco Use  . Smoking status: Never Smoker  . Smokeless tobacco: Never Used  Vaping Use  . Vaping Use: Never used  Substance and Sexual Activity  . Alcohol use: No  . Drug use: No  . Sexual activity: Never  Other Topics Concern  . Not on file  Social History Narrative  . Not on file   Social Determinants of Health   Financial Resource Strain: Not on file  Food Insecurity: Not on file  Transportation Needs: Not on file  Physical Activity: Not on file  Stress: Not on file  Social Connections: Not on file  Intimate Partner Violence: Not on file    Vital Signs: Blood pressure (!) 149/76, pulse 80, temperature 98 F (36.7 C), resp. rate 16, height $RemoveBe'5\' 2"'SLeRwCqFD$  (1.575 m), weight 140 lb (63.5 kg), SpO2 96 %.  Examination: General Appearance: The patient is well-developed, well-nourished, and in no distress. Skin: Gross inspection of skin unremarkable. Head: normocephalic, no gross deformities. Eyes: no gross deformities noted. ENT: ears appear grossly normal no exudates. Neck: Supple. No thyromegaly. No LAD. Respiratory: Lungs clear to auscultation bilaterally. Cardiovascular: Normal S1 and S2 without murmur or rub. Extremities: No cyanosis. pulses are equal. Neurologic: Alert and oriented. No involuntary movements.  LABS: No results found for this or any previous visit (from the past 2160 hour(s)).  Radiology: DG Chest 2 View  Result Date: 12/20/2019 CLINICAL DATA:  Cough EXAM: CHEST - 2 VIEW COMPARISON:  09/23/2019 FINDINGS: Chronic interstitial changes. Stable mild elevation of the right hemidiaphragm. No new consolidation or edema. No pleural effusion or pneumothorax. Stable  cardiomediastinal contours. No acute osseous abnormality. IMPRESSION: No acute process in the chest. Electronically Signed   By: Macy Mis M.D.   On: 12/20/2019 16:32    No results found.  No results found.    Assessment and Plan: Patient Active Problem List   Diagnosis Date Noted  . Atherosclerosis of aorta ([place]) 01/25/2020  . Blood in stool 12/23/2019  . Constipation 12/23/2019  . Acute bacterial sinusitis 10/30/2019  . Acute heart failure with preserved ejection fraction (HFpEF) ([place])   . Shortness of breath 10/21/2018  . Atrial fibrillation with rapid ventricular response ([place]) 10/10/2018  . Atrial fibrillation with RVR ([place]) 10/10/2018  . Nocturnal hypoxemia due to asthma 10/29/2017  . Acute pain of right wrist 09/30/2017  . Hyperlipidemia 07/23/2017  . Osteoporosis, post-menopausal 07/03/2017  . Pain in left knee 05/28/2017  . Essential (primary) hypertension 05/28/2017  . Cardiac arrhythmia 05/28/2017  . Hypothyroidism 05/28/2017  . Injury of right hip 06/15/2015  . Mild intermittent asthma without complication 54/65/0354   1. Chronic asthma, mild intermittent, uncomplicated Stable, continue inhalers as prescribed and as indicated - fluticasone (FLOVENT HFA) 110 MCG/ACT inhaler; TAKE 2 PUFFS BY MOUTH TWICE A DAY  Dispense: 3 each; Refill: 3  2. Pulmonary fibrosis (HCC) Stable, continue on oxygen  3. Oxygen dependent Continue on oxygen  4. Chronic heart failure with preserved ejection fraction ([place]) Followed by cardiology  5. Diabetes mellitus without complication ([place]) Requesting Test strip refills today which have been sent - glucose blood (FREESTYLE LITE) test strip; Use as directed once a daily Diag E11.65  Dispense: 100 each; Refill: 1   General Counseling: I have discussed the findings of the evaluation and examination with [place].  I have also discussed any further diagnostic evaluation thatmay be needed or ordered today. Sh[place]ale verbalizes understanding  of the findings of todays visit. We also reviewed her medications today and discussed drug interactions and side effects including but not limited excessive drowsiness and altered mental states. We also discussed that there is always a risk not just to her but also people around her. she has been encouraged to call the office with any questions or concerns that should arise related to todays visit.  No orders of the defined types were placed in this encounter.    Time spent: 30  I have personally obtained a history, examined the patient, evaluated laboratory and imaging results, formulated the assessment and plan and placed orders. This  patient was seen by Drema [place], PA-C in collaboration with Dr. Devona Konig as a part of collaborative care agreement.     Allyne Gee, MD [facility] Pulmonary and Critical Care Sleep medicine

## 2020-09-22 NOTE — Progress Notes (Signed)
Cardiology Office Note:    Date:  09/25/2020   ID:  Ashley Weiss, DOB Sep 18, 1919, MRN [id number]  PCP:  Ashley Guise, MD  [facility] Cardiologist:  Ashley Sacramento, MD  [facility] Electrophysiologist:  None   Referring MD: Ashley Guise, MD   Chief Complaint: 4 month follow-up  History of Present Illness:    Ashley Weiss is a 87 y.o. female with a hx of persistent atrial fibrillation not on Eliquis due to history of falls, chronic hypoxic respiratory failure due to ILD, prior DVT, chronic diastolic heart failure, b/l carotid disease, hyperlipidemia, hypertension being seen for 72-monthfollow-up.  Patient has a history of atrial fibrillation, rate control was very difficult and she was ultimately underwent cardioversion July 2020 after starting amiodarone.  Echo July 2020 showed EF 55 to 60%, moderate dilated left atrium, moderate Neri hypertension, moderate TR.  Amiodarone was stopped in January 2021 due to concerns of lung toxicity requiring oxygen therapy.  Chest x-ray showed interstitial lung disease.  Pulmonary function testing showed severe restrictive lung disease with severely decreased DLCO.  He also has a history of recurrent falls and physical injuries and thus anticoagulation was discontinued.  Patient last seen by Dr. AFletcher Weiss telehealth on 05/23/2020.  She was doing well from a cardiac standpoint.  Today, she is accompanied by one of her daughters. The patient reports she has been doing well from a cardiac standpoint. [place] daily, inside and outside. She does sitting Yoga every day. She uses 1L O2 during the day, and 2L at night. She turns it up for walking. She follows with pulmonology for lung disease. Soime palpitations with caffeine, mostly at night. She has chronic orthostasis, always counts to 10 before walking. Diet is home cooked meals. She takes Aspirin daily, no bleeding issues. No LLE.she wears compression  stockings up to the thigh for venous insufficiency. She has lasix to take as needed, but has not needed. No LLE on exam. She is tolerating statin. Recent BMET reviewed. She will see her PCP soon who will do labs. EKG shows SR with PRI 2371m which is similar to prior EKGs.    Past Medical History:  Diagnosis Date  . Arthritis   . Asthma   . Basal cell carcinoma 08/02/2014   R check ant to earlobe   . Cancer ([place]   skin  . Depression   . DVT (deep venous thrombosis) ([facility]  . Hyperlipidemia   . Hypertension   . Phlebitis   . Squamous cell carcinoma of skin 10/19/2014   right cheek    Past Surgical History:  Procedure Laterality Date  . BREAST CYST ASPIRATION Left 20+ yrs ago  . CARDIOVERSION N/A 11/11/2018   Procedure: CARDIOVERSION;  Surgeon: EnNelva BushMD;  Location: [facility] ORS;  Service: Cardiovascular;  Laterality: N/A;  . CATARACT EXTRACTION Bilateral [id number]. cyst removal-right shoulder  1972  . otosclerosis Left 1988   hardening of bone, other 1989 redone  . THYROIDECTOMY  1970   nodule and 1/2 bridge removal  . WRIST SURGERY Left 11/29/2009    Current Medications: Current Meds  Medication Sig  . albuterol (VENTOLIN HFA) 108 (90 Base) MCG/ACT inhaler Inhale 2 puffs into the lungs every 6 (six) hours as needed for wheezing or shortness of breath.  . Marland Kitchenspirin EC 81 MG tablet Take 1 tablet (81 mg total) by mouth daily.  . Marland Kitchen complex vitamins capsule Take 1  capsule by mouth daily.  . Blood Glucose Monitoring Suppl (FREESTYLE FREEDOM LITE) w/Device KIT 1 kit by Does not apply route daily.  . Calcium Citrate-Vitamin D (CITRACAL + D PO) Take by mouth.  . cholecalciferol (VITAMIN D3) 25 MCG (1000 UNIT) tablet Take 1,000 Units by mouth daily.  Marland Kitchen denosumab (PROLIA) 60 MG/ML SOSY injection Inject 60 mg into the skin every 6 (six) months.  . diltiazem (CARDIZEM CD) 120 MG 24 hr capsule Take 1 capsule (120 mg total) by mouth daily.  . Fluocinolone Acetonide 0.01 % OIL  Place 4 drops in ear(s) at bedtime as needed.   . fluticasone (FLONASE) 50 MCG/ACT nasal spray PLACE 1 SPRAY INTO BOTH NOSTRILS DAILY.  . fluticasone (FLOVENT HFA) 110 MCG/ACT inhaler TAKE 2 PUFFS BY MOUTH TWICE A DAY  . furosemide (LASIX) 40 MG tablet TAKE 1 TABLET DAILY AS NEEDED FOR WEIGHT GAIN, INCREASED SHORTNESS OF BREATH, OR SWELLING  . glucose blood (FREESTYLE LITE) test strip Use as directed once a daily Diag E11.65  . guaiFENesin (MUCINEX) 600 MG 12 hr tablet Take by mouth 2 (two) times daily.  Marland Kitchen ipratropium-albuterol (DUONEB) 0.5-2.5 (3) MG/3ML SOLN USE 1 VIAL 3 TIMES DAILY AS NEEDED FOR SHORTNESS OF BREATH DX J44.9  . Lancets (FREESTYLE) lancets Use as directed once daily diag e11.65  . levothyroxine (SYNTHROID) 25 MCG tablet Take 1 daily by mouth in the morning on a empty stomach  . metoprolol tartrate (LOPRESSOR) 100 MG tablet TAKE 1 TABLET BY MOUTH TWICE DAILY  . metoprolol tartrate (LOPRESSOR) 25 MG tablet TAKE 1 TABLET (25 MG TOTAL) BY MOUTH DAILY AS NEEDED (FOR A FIB).  Marland Kitchen montelukast (SINGULAIR) 10 MG tablet TAKE 1 TABLET BY MOUTH EVERY DAY FOR COUGH  . Multiple Vitamins-Minerals (MULTIVITAMIN WITH MINERALS) tablet Take 1 tablet by mouth daily.  . OXYGEN Inhale 2 L into the lungs every evening. Sleep with nasal cannula w/ O2 at 2lpm.  . potassium chloride (KLOR-CON M10) 10 MEQ tablet TAKE 1 TABLET BY MOUTH EVERY DAY AS NEEDED ON DAYS WHEN TAKING LASIX  . Respiratory Therapy Supplies (ONE FLOW SPIROMETER) DEVI Please use device as tolerated and according to instruction provided.  . rosuvastatin (CRESTOR) 5 MG tablet Take 1 tablet (5 mg total) by mouth daily at 6 PM.  . sertraline (ZOLOFT) 100 MG tablet Take 1 tablet (100 mg total) by mouth daily.     Allergies:   Hydralazine, Biaxin [clarithromycin], Ciprofloxacin, Meloxicam, and Minocycline   Social History   Socioeconomic History  . Marital status: Widowed    Spouse name: Not on file  . Number of children: Not on file   . Years of education: Not on file  . Highest education level: Not on file  Occupational History  . Not on file  Tobacco Use  . Smoking status: Never Smoker  . Smokeless tobacco: Never Used  Vaping Use  . Vaping Use: Never used  Substance and Sexual Activity  . Alcohol use: No  . Drug use: No  . Sexual activity: Never  Other Topics Concern  . Not on file  Social History Narrative  . Not on file   Social Determinants of Health   Financial Resource Strain: Not on file  Food Insecurity: Not on file  Transportation Needs: Not on file  Physical Activity: Not on file  Stress: Not on file  Social Connections: Not on file     Family History: The patient's family history includes Atrial fibrillation in her sister; Cancer in her sister;  Depression in her father; Hodgkin's lymphoma in her brother; Multiple myeloma in her brother; Osteoarthritis in her mother; Prostate cancer in her brother. There is no history of Breast cancer.  ROS:   Please see the history of present illness.     All other systems reviewed and are negative.  EKGs/Labs/Other Studies Reviewed:    The following studies were reviewed today:  Echo 11/11/18 1. The left ventricle has normal systolic function, with an ejection  fraction of 55-60%. The cavity size was normal. Left ventricular diastolic  Doppler parameters are consistent with restrictive filling. Elevated mean  left atrial pressure No evidence of  left ventricular regional wall motion abnormalities.  2. The right ventricle has mildly reduced systolic function. The cavity  was normal. There is no increase in right ventricular wall thickness.  Right ventricular systolic pressure is moderately elevated with an  estimated pressure of 58.3 mmHg.  3. Left atrial size was moderately dilated.  4. Right atrial size was mildly dilated.  5. The mitral valve is degenerative. Mild thickening of the mitral valve  leaflet. There is mild mitral annular  calcification present. Mitral valve  regurgitation is moderate by color flow Doppler.  6. Tricuspid valve regurgitation is moderate.  7. The aortic valve is tricuspid. Moderate thickening of the aortic  valve. Sclerosis without any evidence of stenosis of the aortic valve.  Aortic valve regurgitation is trivial by color flow Doppler.  8. The aorta is normal in size and structure.  9. The inferior vena cava was normal in size with <50% respiratory  variability.   [place] b/l carotids 04/2018 Impression:    The RIGHT CAROTID shows mild plaque with less than 50% stenosis.  The LEFT CAROTID shows less than 50% stenosis. There is mild  plaque formation noted on the LEFT and mild plaque on the RIGHT   side. Consider a repeat Carotid doppler if clinical situation and  symptoms warrant in 6-12 months. Patient should be encouraged to  change lifestyles such as smoking cessation, regular exercise and  dietary modification. Use of statins in the right clinical  setting and ASA is encouraged.   EKG:  EKG is ordered today.  The ekg ordered today demonstrates NSr, 81bpm, first degree AV blcok. Mild LVH, PRI 264m  Recent Labs: 06/20/2020: ALT 24; BUN 20; Creatinine, Ser 0.65; Potassium 4.2; Sodium 143  Recent Lipid Panel    Component Value Date/Time   CHOL 79 11/11/2018 0317   CHOL 119 10/09/2018 0848   TRIG 34 11/11/2018 0317   HDL 30 (L) 11/11/2018 0317   HDL 38 (L) 10/09/2018 0848   CHOLHDL 2.6 11/11/2018 0317   VLDL 7 11/11/2018 0317   LDLCALC 42 11/11/2018 0317   LDLCALC 57 10/09/2018 0848    Physical Exam:    VS:  BP 130/78 (BP Location: Left Arm, Patient Position: Sitting, Cuff Size: Normal)   Pulse 81   Ht 5' 1"  (1.549 m)   Wt 139 lb 6 oz (63.2 kg)   SpO2 98% Comment: 2 Liters of oxygen  BMI 26.33 kg/m     Wt Readings from Last 3 Encounters:  09/25/20 139 lb 6 oz (63.2 kg)  09/21/20 140 lb (63.5 kg)  05/23/20 134 lb 2 oz (60.8 kg)     GEN:  Well nourished, well  developed in no acute distress HEENT: Normal NECK: No JVD; No carotid bruits LYMPHATICS: No lymphadenopathy CARDIAC: RRR, no murmurs, rubs, gallops RESPIRATORY:  Clear to auscultation without rales, wheezing or rhonchi; 1-2L  O2 ABDOMEN: Soft, non-tender, non-distended MUSCULOSKELETAL:  No edema; No deformity  SKIN: Warm and dry NEUROLOGIC:  Alert and oriented x 3 PSYCHIATRIC:  Normal affect   ASSESSMENT:    1. Essential (primary) hypertension   2. Persistent atrial fibrillation ([place])   3. Essential hypertension   4. Chronic diastolic CHF (congestive heart failure) ([place])   5. Chronic respiratory failure with hypoxia (HCC)   6. First degree AV block   7. Bilateral carotid artery disease, unspecified type ([place])    PLAN:    In order of problems listed above:  Persistent Afib She is in NSR today with heart rate 81bpm. She has rare palpitations, worse with caffeine, so she stays away from caffeine. History of ILD 2/2 amiodarone, now on 1-2 L O2 at home. She follows with pulmonology for lung disease. No anticoagulation due to history of falls. Continue rate control with diltiazem and metoprolol.   Hypertension Chronic Orthostasis Patient reports chronic orthostatic symptoms. Every time she stands up she counts to 10 before walking. No recent falls. BP today is good, 130/78.   Hyperlipidemia She is tolerating statin well. Reported she will see PCP who will check labs. LDL 42 in 10/2018. Continue Crestor 27m daily.   Chronic diastolic heart failure  She wears thigh high compression socks for venous insufficiency. Euvolemic on exam. She eats low salt diet. She has lasix to take as needed, however has not needed it. Continue Metoprolol and ARB.   1st degree AV block EKG with PRI 2347m in the past has been up to 226 ms. Continue to monitor with CCB/BB use. Recent labs with normal electrolytes. PCP to update labs.  Bilateral carotid artery disease [place] 04/2018 showed less than 50%  plaque bilaterally, recommending repeating if symptoms warrant. Appears to have chronic orthostatic symptoms, can consider repeat at follow-up. Continue Aspirin and statin.   Disposition: Follow up in 4 month(s) with MD     Signed, Sorina Derrig H Ninfa MeekerPA-C  09/25/2020 12:05 PM    [place]

## 2020-09-24 NOTE — Patient Instructions (Addendum)
Pulmonary Fibrosis  Pulmonary fibrosis is a type of lung disease that causes scarring. Over time, the scar tissue builds up in the air sacs of your lungs (alveoli). This makes it hard for you to breathe. Less oxygen can get into your blood. Scarring from pulmonary fibrosis gets worse over time. This damage is permanent and may lead to other serious health problems. What are the causes? There are many different causes of pulmonary fibrosis. Sometimes the cause is not known. This is called idiopathic pulmonary fibrosis. Other causes include:  Exposure to chemicals and substances found in agricultural, farm, Architect, or factory work. These include mold, asbestos, silica, metal dusts, and toxic fumes.  Sarcoidosis. In this disease, areas of inflammatory cells (granulomas) form and most often affect the lungs.  Autoimmune diseases. These include diseases such as rheumatoid arthritis, systemic sclerosis, or connective tissue disease.  Taking certain medicines. These include drugs used in radiation therapy or used to treat seizures, heart problems, and some infections. What increases the risk? You are more likely to develop this condition if:  You have a family history of the disease.  You are older. The condition is more common in older adults.  You have a history of smoking.  You have a job that exposes you to certain chemicals.  You have gastroesophageal reflux disease (GERD). What are the signs or symptoms? Symptoms of this condition include:  Difficulty breathing that gets worse with activity.  Shortness of breath (dyspnea).  Dry, hacking cough.  Rapid, shallow breathing during exercise or while at rest.  Bluish skin and lips.  Loss of appetite.  Weakness.  Weight loss and fatigue.  Rounded and enlarged fingertips (clubbing). How is this diagnosed? This condition may be diagnosed based on:  Your symptoms and medical history.  A physical exam. You may also have  tests, including:  A test that involves looking inside your lungs with an instrument (bronchoscopy).  Imaging studies of your lungs and heart.  Tests to measure how well you are breathing (pulmonary function tests).  Blood tests.  Tests to see how well your lungs work while you are walking (pulmonary stress test).  A procedure to remove a lung tissue sample to look at it under a microscope (biopsy). How is this treated? There is no cure for pulmonary fibrosis. Treatment focuses on managing symptoms and preventing scarring from getting worse. This may include:  Medicines, such as: ? Steroids to prevent permanent lung changes. ? Medicines to suppress your body's defense system (immune system). ? Medicines to help with lung function by reducing inflammation or scarring.  Ongoing monitoring with X-rays and lab work.  Oxygen therapy.  Pulmonary rehabilitation.  Surgery. In some cases, a lung transplant is possible. Follow these instructions at home: Medicines  Take over-the-counter and prescription medicines only as told by your health care provider.  Keep your vaccinations up to date as recommended by your health care provider. General instructions  Do not use any products that contain nicotine or tobacco, such as cigarettes and e-cigarettes. If you need help quitting, ask your health care provider.  Get regular exercise, but do not overexert yourself. Ask your health care provider to suggest some activities that are safe for you to do. ? If you have physical limitations, you may get exercise by walking, using a stationary bike, or doing chair exercises. ? Ask your health care provider about using oxygen while exercising.  If you are exposed to chemicals and substances at work, make sure that you  wear a mask or respirator at all times.  Join a pulmonary rehabilitation program or a support group for people with pulmonary fibrosis.  Eat small meals often so you do not get too  full. Overeating can make breathing trouble worse.  Maintain a healthy weight. Lose weight if you need to.  Do breathing exercises as directed by your health care provider.  Keep all follow-up visits as told by your health care provider. This is important.      Contact a health care provider if you:  Have symptoms that do not get better with medicines.  Are not able to be as active as usual.  Have trouble taking a deep breath.  Have a fever or chills.  Have blue lips or skin.  Have clubbing of your fingers. Get help right away if you:  Have a sudden worsening of your symptoms.  Have chest pain.  Cough up mucus that is dark in color.  Have a lot of headaches.  Get very confused or sleepy. Summary  Pulmonary fibrosis is a type of lung disease that causes scar tissue to build up in the air sacs of your lungs (alveoli) over time. Less oxygen can get into your blood. This makes it hard for you to breathe.  Scarring from pulmonary fibrosis gets worse over time. This damage is permanent and may lead to other serious health problems.  You are more likely to develop this condition if you have a family history of the condition or a job that exposes you to certain chemicals.  There is no cure for pulmonary fibrosis. Treatment focuses on managing symptoms and preventing scarring from getting worse. This information is not intended to replace advice given to you by your health care provider. Make sure you discuss any questions you have with your health care provider. Document Revised: 05/14/2017 Document Reviewed: 05/14/2017 Elsevier Patient Education  2021 [employer].

## 2020-09-25 ENCOUNTER — Other Ambulatory Visit: Payer: Self-pay

## 2020-09-25 ENCOUNTER — Encounter: Payer: Self-pay | Admitting: Medical

## 2020-09-25 ENCOUNTER — Ambulatory Visit (INDEPENDENT_AMBULATORY_CARE_PROVIDER_SITE_OTHER): Payer: Medicare Other | Admitting: Medical

## 2020-09-25 VITALS — BP 130/78 | HR 81 | Ht 61.0 in | Wt 139.4 lb

## 2020-09-25 DIAGNOSIS — I779 Disorder of arteries and arterioles, unspecified: Secondary | ICD-10-CM

## 2020-09-25 DIAGNOSIS — J9611 Chronic respiratory failure with hypoxia: Secondary | ICD-10-CM

## 2020-09-25 DIAGNOSIS — I440 Atrioventricular block, first degree: Secondary | ICD-10-CM | POA: Diagnosis not present

## 2020-09-25 DIAGNOSIS — I4819 Other persistent atrial fibrillation: Secondary | ICD-10-CM | POA: Diagnosis not present

## 2020-09-25 DIAGNOSIS — I10 Essential (primary) hypertension: Secondary | ICD-10-CM

## 2020-09-25 DIAGNOSIS — I5032 Chronic diastolic (congestive) heart failure: Secondary | ICD-10-CM

## 2020-09-25 NOTE — Patient Instructions (Signed)
Medication Instructions:  Your physician recommends that you continue on your current medications as directed. Please refer to the Current Medication list given to you today.  *If you need a refill on your cardiac medications before your next appointment, please call your pharmacy*   Lab Work:  None ordered  Testing/Procedures: None ordered   Follow-Up: At [facility], you and your health needs are our priority.  As part of our continuing mission to provide you with exceptional heart care, we have created designated Provider Care Teams.  These Care Teams include your primary Cardiologist (physician) and Advanced Practice Providers (APPs -  Physician Assistants and Nurse Practitioners) who all work together to provide you with the care you need, when you need it.  We recommend signing up for the patient portal called "MyChart".  Sign up information is provided on this After Visit Summary.  MyChart is used to connect with patients for Virtual Visits (Telemedicine).  Patients are able to view lab/test results, encounter notes, upcoming appointments, etc.  Non-urgent messages can be sent to your provider as well.   To learn more about what you can do with MyChart, go to [email or web address].    Your next appointment:   4 month(s) with Dr. Fletcher Anon   The format for your next appointment:   In Person  Provider:    You may see Kathlyn Sacramento, MD

## 2020-09-26 ENCOUNTER — Ambulatory Visit (INDEPENDENT_AMBULATORY_CARE_PROVIDER_SITE_OTHER): Payer: Medicare Other | Admitting: Internal Medicine

## 2020-09-26 ENCOUNTER — Encounter: Payer: Self-pay | Admitting: Internal Medicine

## 2020-09-26 VITALS — BP 142/77 | HR 72 | Temp 95.5°F | Resp 16 | Ht 62.0 in | Wt 136.6 lb

## 2020-09-26 DIAGNOSIS — I5032 Chronic diastolic (congestive) heart failure: Secondary | ICD-10-CM

## 2020-09-26 DIAGNOSIS — I700 Atherosclerosis of aorta: Secondary | ICD-10-CM

## 2020-09-26 DIAGNOSIS — I480 Paroxysmal atrial fibrillation: Secondary | ICD-10-CM | POA: Diagnosis not present

## 2020-09-26 DIAGNOSIS — I10 Essential (primary) hypertension: Secondary | ICD-10-CM

## 2020-09-26 NOTE — Progress Notes (Signed)
[facility] [place], [place] [address]  Internal MEDICINE  Telephone Visit  Patient Name: Ashley Weiss  [id number]  [id number]  Date of Service: 09/26/2020  I connected with the patient at 1040 by telephone and verified the patients identity using two identifiers.   I discussed the limitations, risks, security and privacy concerns of performing an evaluation and management service by telephone and the availability of in person appointments. I also discussed with the patient that there may be a patient responsible charge related to the service.  The patient expressed understanding and agrees to proceed.    Chief Complaint  Patient presents with   Follow-up    Pt is doing well   Telephone Screen    [phone]    Telephone Assessment    VIDEO   Depression   Hypertension   Asthma   Hyperlipidemia   Quality Metric Gaps    Pneumovax, shingrix     HPI Patient is connected today virtual visit for routine follow-up. She is living with her daughter and is unable to in person for visit. She denies any complaints, blood pressure was slightly elevated this morning however all other home readings have been normal. Asthma is under good control patient is vaccinated and boosted for COVID-19    Current Medication: Outpatient Encounter Medications as of 09/26/2020  Medication Sig   albuterol (VENTOLIN HFA) 108 (90 Base) MCG/ACT inhaler Inhale 2 puffs into the lungs every 6 (six) hours as needed for wheezing or shortness of breath.   aspirin EC 81 MG tablet Take 1 tablet (81 mg total) by mouth daily.   b complex vitamins capsule Take 1 capsule by mouth daily.   Blood Glucose Monitoring Suppl (FREESTYLE FREEDOM LITE) w/Device KIT 1 kit by Does not apply route daily.   Calcium Citrate-Vitamin D (CITRACAL + D PO) Take by mouth.   cholecalciferol (VITAMIN D3) 25 MCG (1000 UNIT) tablet Take 1,000 Units by mouth daily.   denosumab (PROLIA) 60 MG/ML SOSY injection  Inject 60 mg into the skin every 6 (six) months.   diltiazem (CARDIZEM CD) 120 MG 24 hr capsule Take 1 capsule (120 mg total) by mouth daily.   Fluocinolone Acetonide 0.01 % OIL Place 4 drops in ear(s) at bedtime as needed.    fluticasone (FLONASE) 50 MCG/ACT nasal spray PLACE 1 SPRAY INTO BOTH NOSTRILS DAILY.   fluticasone (FLOVENT HFA) 110 MCG/ACT inhaler TAKE 2 PUFFS BY MOUTH TWICE A DAY   furosemide (LASIX) 40 MG tablet TAKE 1 TABLET DAILY AS NEEDED FOR WEIGHT GAIN, INCREASED SHORTNESS OF BREATH, OR SWELLING   glucose blood (FREESTYLE LITE) test strip Use as directed once a daily Diag E11.65   guaiFENesin (MUCINEX) 600 MG 12 hr tablet Take by mouth 2 (two) times daily.   ipratropium-albuterol (DUONEB) 0.5-2.5 (3) MG/3ML SOLN USE 1 VIAL 3 TIMES DAILY AS NEEDED FOR SHORTNESS OF BREATH DX J44.9   Lancets (FREESTYLE) lancets Use as directed once daily diag e11.65   levothyroxine (SYNTHROID) 25 MCG tablet Take 1 daily by mouth in the morning on a empty stomach   metoprolol tartrate (LOPRESSOR) 100 MG tablet TAKE 1 TABLET BY MOUTH TWICE DAILY   metoprolol tartrate (LOPRESSOR) 25 MG tablet TAKE 1 TABLET (25 MG TOTAL) BY MOUTH DAILY AS NEEDED (FOR A FIB).   montelukast (SINGULAIR) 10 MG tablet TAKE 1 TABLET BY MOUTH EVERY DAY FOR COUGH   Multiple Vitamins-Minerals (MULTIVITAMIN WITH MINERALS) tablet Take 1 tablet by mouth daily.   OXYGEN  Inhale 2 L into the lungs every evening. Sleep with nasal cannula w/ O2 at 2lpm.   potassium chloride (KLOR-CON M10) 10 MEQ tablet TAKE 1 TABLET BY MOUTH EVERY DAY AS NEEDED ON DAYS WHEN TAKING LASIX   Respiratory Therapy Supplies (ONE FLOW SPIROMETER) DEVI Please use device as tolerated and according to instruction provided.   rosuvastatin (CRESTOR) 5 MG tablet Take 1 tablet (5 mg total) by mouth daily at 6 PM.   sertraline (ZOLOFT) 100 MG tablet Take 1 tablet (100 mg total) by mouth daily.   No facility-administered encounter medications on file as of 09/26/2020.     Surgical History: Past Surgical History:  Procedure Laterality Date   BREAST CYST ASPIRATION Left 20+ yrs ago   CARDIOVERSION N/A 11/11/2018   Procedure: CARDIOVERSION;  Surgeon: Nelva Bush, MD;  Location: [facility] ORS;  Service: Cardiovascular;  Laterality: N/A;   CATARACT EXTRACTION Bilateral 2005,2009   cyst removal-right shoulder  1972   otosclerosis Left 1988   hardening of bone, other 1989 redone   THYROIDECTOMY  1970   nodule and 1/2 bridge removal   WRIST SURGERY Left 11/29/2009    Medical History: Past Medical History:  Diagnosis Date   Arthritis    Asthma    Basal cell carcinoma 08/02/2014   R check ant to earlobe    Cancer ([place])    skin   Depression    DVT (deep venous thrombosis) ([place])    Hyperlipidemia    Hypertension    Phlebitis    Squamous cell carcinoma of skin 10/19/2014   right cheek    Family History: Family History  Problem Relation Age of Onset   Osteoarthritis Mother    Depression Father    Atrial fibrillation Sister    Multiple myeloma Brother    Cancer Sister    Hodgkin's lymphoma Brother    Prostate cancer Brother    Breast cancer Neg Hx     Social History   Socioeconomic History   Marital status: Widowed    Spouse name: Not on file   Number of children: Not on file   Years of education: Not on file   Highest education level: Not on file  Occupational History   Not on file  Tobacco Use   Smoking status: Never Smoker   Smokeless tobacco: Never Used  Vaping Use   Vaping Use: Never used  Substance and Sexual Activity   Alcohol use: No   Drug use: No   Sexual activity: Never  Other Topics Concern   Not on file  Social History Narrative   Not on file   Social Determinants of Health   Financial Resource Strain: Not on file  Food Insecurity: Not on file  Transportation Needs: Not on file  Physical Activity: Not on file  Stress: Not on file  Social Connections: Not on file  Intimate Partner Violence: Not on file       Review of Systems  Constitutional:  Negative for chills, fatigue, fever and unexpected weight change.  HENT:  Negative for congestion, mouth sores, postnasal drip, rhinorrhea, sneezing and sore throat.   Eyes:  Negative for redness.  Respiratory:  Negative for cough, chest tightness and shortness of breath.   Cardiovascular:  Negative for chest pain and palpitations.  Gastrointestinal:  Negative for abdominal pain, constipation, diarrhea, nausea and vomiting.  Genitourinary:  Negative for dysuria, flank pain and frequency.  Musculoskeletal:  Negative for arthralgias, back pain, joint swelling and neck pain.  Skin:  Negative  for rash.  Neurological: Negative.  Negative for tremors and numbness.  Hematological:  Negative for adenopathy. Does not bruise/bleed easily.  Psychiatric/Behavioral: Negative.  Negative for behavioral problems (Depression), sleep disturbance and suicidal ideas. The patient is not nervous/anxious.    Vital Signs: BP (!) 142/77   Pulse 72   Temp (!) 95.5 F (35.3 C)   Resp 16   Ht 5' 2"  (1.575 m)   Wt 136 lb 9.6 oz (62 kg)   SpO2 99%   BMI 24.98 kg/m    Observation/Objective: Patient looks very comfortable very pleasant to talk to, does not look of her age    Assessment/Plan: 1. Chronic heart failure with preserved ejection fraction ([place]) This is stable and is followed by cardiology.  No acute exacerbation  2. Essential (primary) hypertension Initial blood pressure is slightly elevated however home readings are normal  3. Paroxysmal atrial fibrillation (HCC) This is stable and controlled with medications  4. Atherosclerosis of aorta (HCC) We will continue low-dose Crestor 5 mg once a day  General Counseling: Donicia verbalizes understanding of the findings of today's phone visit and agrees with plan of treatment. I have discussed any further diagnostic evaluation that may be needed or ordered today. We also reviewed her medications today. she  has been encouraged to call the office with any questions or concerns that should arise related to todays visit.   Time spent:15 Minutes    Dr Lavera Guise Internal medicine

## 2020-09-29 ENCOUNTER — Other Ambulatory Visit: Payer: Self-pay | Admitting: Cardiovascular Disease

## 2020-10-03 ENCOUNTER — Ambulatory Visit: Payer: Medicare Other | Admitting: Internal Medicine

## 2020-10-10 DIAGNOSIS — Z85828 Personal history of other malignant neoplasm of skin: Secondary | ICD-10-CM | POA: Diagnosis not present

## 2020-10-10 DIAGNOSIS — L814 Other melanin hyperpigmentation: Secondary | ICD-10-CM | POA: Diagnosis not present

## 2020-10-10 DIAGNOSIS — L579 Skin changes due to chronic exposure to nonionizing radiation, unspecified: Secondary | ICD-10-CM | POA: Diagnosis not present

## 2020-10-10 DIAGNOSIS — L570 Actinic keratosis: Secondary | ICD-10-CM | POA: Diagnosis not present

## 2020-10-10 DIAGNOSIS — D692 Other nonthrombocytopenic purpura: Secondary | ICD-10-CM | POA: Diagnosis not present

## 2020-10-10 DIAGNOSIS — L821 Other seborrheic keratosis: Secondary | ICD-10-CM | POA: Diagnosis not present

## 2020-10-10 DIAGNOSIS — D1801 Hemangioma of skin and subcutaneous tissue: Secondary | ICD-10-CM | POA: Diagnosis not present

## 2020-11-16 ENCOUNTER — Other Ambulatory Visit: Payer: Self-pay | Admitting: Internal Medicine

## 2020-11-24 DIAGNOSIS — H6063 Unspecified chronic otitis externa, bilateral: Secondary | ICD-10-CM | POA: Diagnosis not present

## 2020-11-24 DIAGNOSIS — H903 Sensorineural hearing loss, bilateral: Secondary | ICD-10-CM | POA: Diagnosis not present

## 2020-11-24 DIAGNOSIS — H6122 Impacted cerumen, left ear: Secondary | ICD-10-CM | POA: Diagnosis not present

## 2021-01-01 DIAGNOSIS — H6063 Unspecified chronic otitis externa, bilateral: Secondary | ICD-10-CM | POA: Diagnosis not present

## 2021-01-01 DIAGNOSIS — H6123 Impacted cerumen, bilateral: Secondary | ICD-10-CM | POA: Diagnosis not present

## 2021-01-01 DIAGNOSIS — H903 Sensorineural hearing loss, bilateral: Secondary | ICD-10-CM | POA: Diagnosis not present

## 2021-01-18 ENCOUNTER — Telehealth: Payer: Self-pay

## 2021-01-18 ENCOUNTER — Other Ambulatory Visit: Payer: Self-pay

## 2021-01-18 ENCOUNTER — Encounter: Payer: Self-pay | Admitting: Cardiovascular Disease

## 2021-01-18 ENCOUNTER — Ambulatory Visit (INDEPENDENT_AMBULATORY_CARE_PROVIDER_SITE_OTHER): Payer: Medicare Other | Admitting: Cardiovascular Disease

## 2021-01-18 ENCOUNTER — Other Ambulatory Visit: Payer: Self-pay | Admitting: Internal Medicine

## 2021-01-18 VITALS — BP 138/70 | HR 78 | Ht 62.0 in | Wt 138.0 lb

## 2021-01-18 DIAGNOSIS — I5032 Chronic diastolic (congestive) heart failure: Secondary | ICD-10-CM | POA: Diagnosis not present

## 2021-01-18 DIAGNOSIS — Z9981 Dependence on supplemental oxygen: Secondary | ICD-10-CM

## 2021-01-18 DIAGNOSIS — I10 Essential (primary) hypertension: Secondary | ICD-10-CM | POA: Diagnosis not present

## 2021-01-18 DIAGNOSIS — J4520 Mild intermittent asthma, uncomplicated: Secondary | ICD-10-CM

## 2021-01-18 DIAGNOSIS — I700 Atherosclerosis of aorta: Secondary | ICD-10-CM

## 2021-01-18 DIAGNOSIS — I4819 Other persistent atrial fibrillation: Secondary | ICD-10-CM

## 2021-01-18 DIAGNOSIS — I480 Paroxysmal atrial fibrillation: Secondary | ICD-10-CM

## 2021-01-18 NOTE — Telephone Encounter (Signed)
Ordered

## 2021-01-18 NOTE — Progress Notes (Signed)
Cardiology Office Note   Date:  01/18/2021   ID:  Ashley Weiss, DOB 1919/05/09, MRN [id number]  PCP:  Lavera Guise, MD  Cardiologist:   Kathlyn Sacramento, MD   Chief Complaint  Patient presents with   Other    4 month f/u no complaints today. Meds reviewed verbally with pt.      History of Present Illness: Ashley Weiss is a 87 y.o. female who is here today for a follow-up regarding atrial fibrillation and chronic diastolic heart failure.   She has history of hyperlipidemia, essential hypertension, hyperlipidemia and persistent atrial fibrillation.   Rate control was very difficult  and ultimately she underwent successful cardioversion in July, 2020 after starting amiodarone. Echocardiogram in July,2020 showed an EF of 55 to 60% with moderately dilated left atrium, moderate pulmonary hypertension and moderate tricuspid regurgitation. Amiodarone was stopped in January of 2021 due to concerns about lung toxicity requiring oxygen therapy.  Chest x-ray showed interstitial lung disease. Pulmonary function testing showed severe restrictive lung disease with severely decreased DLCO. She had recurrent falls resulting in physical injuries and thus it was decided to stop anticoagulation.   She has been maintaining in sinus rhythm with diltiazem and metoprolol.  She has very strong social support from her 2 daughters.  She has been doing reasonably well with no chest pain or worsening dyspnea.  No palpitations or tachycardia.  She lives with her daughter in [place].   Past Medical History:  Diagnosis Date   Arthritis    Asthma    Basal cell carcinoma 08/02/2014   R check ant to earlobe    Cancer ([place])    skin   Depression    DVT (deep venous thrombosis) (HCC)    Hyperlipidemia    Hypertension    Phlebitis    Squamous cell carcinoma of skin 10/19/2014   right cheek    Past Surgical History:  Procedure Laterality Date   BREAST CYST ASPIRATION Left 20+ yrs ago    CARDIOVERSION N/A 11/11/2018   Procedure: CARDIOVERSION;  Surgeon: Nelva Bush, MD;  Location: [facility] ORS;  Service: Cardiovascular;  Laterality: N/A;   CATARACT EXTRACTION Bilateral 2005,2009   cyst removal-right shoulder  1972   otosclerosis Left 1988   hardening of bone, other 1989 redone   THYROIDECTOMY  1970   nodule and 1/2 bridge removal   WRIST SURGERY Left 11/29/2009     Current Outpatient Medications  Medication Sig Dispense Refill   albuterol (VENTOLIN HFA) 108 (90 Base) MCG/ACT inhaler Inhale 2 puffs into the lungs every 6 (six) hours as needed for wheezing or shortness of breath. 18 g 3   aspirin EC 81 MG tablet Take 1 tablet (81 mg total) by mouth daily.     b complex vitamins capsule Take 1 capsule by mouth daily.     Blood Glucose Monitoring Suppl (FREESTYLE FREEDOM LITE) w/Device KIT 1 kit by Does not apply route daily. 1 each 0   Calcium Citrate-Vitamin D (CITRACAL + D PO) Take by mouth.     cholecalciferol (VITAMIN D3) 25 MCG (1000 UNIT) tablet Take 1,000 Units by mouth daily.     denosumab (PROLIA) 60 MG/ML SOSY injection Inject 60 mg into the skin every 6 (six) months.     diltiazem (CARDIZEM CD) 120 MG 24 hr capsule Take 1 capsule (120 mg total) by mouth daily. 90 capsule 2   Fluocinolone Acetonide 0.01 % OIL Place 4 drops in ear(s) at bedtime as needed.  fluticasone (FLONASE) 50 MCG/ACT nasal spray PLACE 1 SPRAY INTO BOTH NOSTRILS DAILY. 48 mL 3   fluticasone (FLOVENT HFA) 110 MCG/ACT inhaler TAKE 2 PUFFS BY MOUTH TWICE A DAY 3 each 3   furosemide (LASIX) 40 MG tablet TAKE 1 TABLET DAILY AS NEEDED FOR WEIGHT GAIN, INCREASED SHORTNESS OF BREATH, OR SWELLING 90 tablet 0   glucose blood (FREESTYLE LITE) test strip Use as directed once a daily Diag E11.65 100 each 1   guaiFENesin (MUCINEX) 600 MG 12 hr tablet Take by mouth 2 (two) times daily.     ipratropium-albuterol (DUONEB) 0.5-2.5 (3) MG/3ML SOLN USE 1 VIAL 3 TIMES DAILY AS NEEDED FOR SHORTNESS OF BREATH DX  J44.9 810 mL 1   Lancets (FREESTYLE) lancets Use as directed once daily diag e11.65 100 each 12   levothyroxine (SYNTHROID) 25 MCG tablet TAKE 1 DAILY BY MOUTH IN THE MORNING ON A EMPTY STOMACH 90 tablet 1   metoprolol tartrate (LOPRESSOR) 100 MG tablet TAKE 1 TABLET BY MOUTH TWICE A DAY 180 tablet 3   metoprolol tartrate (LOPRESSOR) 25 MG tablet TAKE 1 TABLET (25 MG TOTAL) BY MOUTH DAILY AS NEEDED (FOR A FIB). 90 tablet 0   montelukast (SINGULAIR) 10 MG tablet TAKE 1 TABLET BY MOUTH EVERY DAY FOR COUGH 90 tablet 3   Multiple Vitamins-Minerals (MULTIVITAMIN WITH MINERALS) tablet Take 1 tablet by mouth daily.     OXYGEN Inhale 2 L into the lungs every evening. Sleep with nasal cannula w/ O2 at 2lpm.     potassium chloride (KLOR-CON M10) 10 MEQ tablet TAKE 1 TABLET BY MOUTH EVERY DAY AS NEEDED ON DAYS WHEN TAKING LASIX 90 tablet 0   Respiratory Therapy Supplies (ONE FLOW SPIROMETER) DEVI Please use device as tolerated and according to instruction provided. 1 Device 0   rosuvastatin (CRESTOR) 5 MG tablet Take 1 tablet (5 mg total) by mouth daily at 6 PM. 90 tablet 2   sertraline (ZOLOFT) 100 MG tablet TAKE 1 TABLET BY MOUTH EVERY DAY 90 tablet 2   No current facility-administered medications for this visit.    Allergies:   Hydralazine, Biaxin [clarithromycin], Ciprofloxacin, Meloxicam, and Minocycline    Social History:  The patient  reports that she has never smoked. She has never used smokeless tobacco. She reports that she does not drink alcohol and does not use drugs.   Family History:  The patient's family history includes Atrial fibrillation in her sister; Cancer in her sister; Depression in her father; Hodgkin's lymphoma in her brother; Multiple myeloma in her brother; Osteoarthritis in her mother; Prostate cancer in her brother.    ROS:  Please see the history of present illness.   Otherwise, review of systems are positive for none.   All other systems are reviewed and negative.     PHYSICAL EXAM: VS:  BP 138/70 (BP Location: Left Arm, Patient Position: Sitting, Cuff Size: Normal)   Pulse 78   Ht _0  (1.575 m)   Wt 138 lb (62.6 kg)   SpO2 96%   BMI 25.24 kg/m  , BMI Body mass index is 25.24 kg/m. GEN: Well nourished, well developed, in no acute distress  HEENT: normal  Neck: no JVD, carotid bruits, or masses Cardiac: Regular rate and rhythm; no murmurs, rubs, or gallops,no edema  Respiratory: Dry crackles consistent with pulmonary fibrosis. GI: soft, nontender, nondistended, + BS MS: no deformity or atrophy  Skin: warm and dry, no rash Neuro:  Strength and sensation are intact Psych: euthymic mood, full  affect   EKG:  EKG is ordered today. The ekg ordered today demonstrates normal sinus rhythm with minimal LVH.  Recent Labs: 06/20/2020: ALT 24; BUN 20; Creatinine, Ser 0.65; Potassium 4.2; Sodium 143    Lipid Panel    Component Value Date/Time   CHOL 79 11/11/2018 0317   CHOL 119 10/09/2018 0848   TRIG 34 11/11/2018 0317   HDL 30 (L) 11/11/2018 0317   HDL 38 (L) 10/09/2018 0848   CHOLHDL 2.6 11/11/2018 0317   VLDL 7 11/11/2018 0317   LDLCALC 42 11/11/2018 0317   LDLCALC 57 10/09/2018 0848      Wt Readings from Last 3 Encounters:  01/18/21 138 lb (62.6 kg)  09/26/20 136 lb 9.6 oz (62 kg)  09/25/20 139 lb 6 oz (63.2 kg)      PAD Screen 10/29/2018  Previous PAD dx? No  Previous surgical procedure? No  Pain with walking? No  Feet/toe relief with dangling? No  Painful, non-healing ulcers? No  Extremities discolored? No      ASSESSMENT AND PLAN:  1.  Persistent atrial fibrillation/flutter:  She is maintaining in sinus rhythm with metoprolol and diltiazem.  She is no longer on anticoagulation due to recurrent falls.    2.  Essential hypertension: Blood pressure is well controlled on current medications.  3.  Chronic diastolic heart failure she appears to be euvolemic.  This was only in the setting of atrial fibrillation.  She has  not required furosemide lately although she does have it at home to be used as needed.    Disposition:   FU with me in 6 month.  Signed,  Kathlyn Sacramento, MD  01/18/2021 11:02 AM    [place]

## 2021-01-18 NOTE — Telephone Encounter (Signed)
Mailed labs.

## 2021-01-18 NOTE — Progress Notes (Signed)
Labs ordered.

## 2021-01-18 NOTE — Patient Instructions (Signed)

## 2021-01-29 DIAGNOSIS — Z23 Encounter for immunization: Secondary | ICD-10-CM | POA: Diagnosis not present

## 2021-02-09 DIAGNOSIS — M810 Age-related osteoporosis without current pathological fracture: Secondary | ICD-10-CM | POA: Diagnosis not present

## 2021-02-14 DIAGNOSIS — H6123 Impacted cerumen, bilateral: Secondary | ICD-10-CM | POA: Diagnosis not present

## 2021-02-14 DIAGNOSIS — H6063 Unspecified chronic otitis externa, bilateral: Secondary | ICD-10-CM | POA: Diagnosis not present

## 2021-02-14 DIAGNOSIS — H903 Sensorineural hearing loss, bilateral: Secondary | ICD-10-CM | POA: Diagnosis not present

## 2021-02-22 DIAGNOSIS — Z23 Encounter for immunization: Secondary | ICD-10-CM | POA: Diagnosis not present

## 2021-02-23 DIAGNOSIS — R296 Repeated falls: Secondary | ICD-10-CM | POA: Diagnosis not present

## 2021-02-23 DIAGNOSIS — R0789 Other chest pain: Secondary | ICD-10-CM | POA: Diagnosis not present

## 2021-02-23 DIAGNOSIS — J3489 Other specified disorders of nose and nasal sinuses: Secondary | ICD-10-CM | POA: Diagnosis not present

## 2021-02-23 DIAGNOSIS — H748X3 Other specified disorders of middle ear and mastoid, bilateral: Secondary | ICD-10-CM | POA: Diagnosis not present

## 2021-02-23 DIAGNOSIS — I672 Cerebral atherosclerosis: Secondary | ICD-10-CM | POA: Diagnosis not present

## 2021-02-23 DIAGNOSIS — I440 Atrioventricular block, first degree: Secondary | ICD-10-CM | POA: Diagnosis not present

## 2021-02-23 DIAGNOSIS — I10 Essential (primary) hypertension: Secondary | ICD-10-CM | POA: Diagnosis not present

## 2021-02-23 DIAGNOSIS — M25551 Pain in right hip: Secondary | ICD-10-CM | POA: Diagnosis not present

## 2021-02-23 DIAGNOSIS — R0781 Pleurodynia: Secondary | ICD-10-CM | POA: Diagnosis not present

## 2021-02-23 DIAGNOSIS — W1839XA Other fall on same level, initial encounter: Secondary | ICD-10-CM | POA: Diagnosis not present

## 2021-02-23 DIAGNOSIS — M25511 Pain in right shoulder: Secondary | ICD-10-CM | POA: Diagnosis not present

## 2021-02-23 DIAGNOSIS — Y998 Other external cause status: Secondary | ICD-10-CM | POA: Diagnosis not present

## 2021-02-26 DIAGNOSIS — I440 Atrioventricular block, first degree: Secondary | ICD-10-CM | POA: Diagnosis not present

## 2021-02-27 ENCOUNTER — Encounter: Payer: Self-pay | Admitting: Internal Medicine

## 2021-02-27 ENCOUNTER — Telehealth (INDEPENDENT_AMBULATORY_CARE_PROVIDER_SITE_OTHER): Payer: Medicare Other | Admitting: Internal Medicine

## 2021-02-27 ENCOUNTER — Other Ambulatory Visit: Payer: Self-pay

## 2021-02-27 VITALS — BP 145/70 | HR 70 | Temp 97.2°F | Ht 62.0 in | Wt 133.4 lb

## 2021-02-27 DIAGNOSIS — J4520 Mild intermittent asthma, uncomplicated: Secondary | ICD-10-CM

## 2021-02-27 DIAGNOSIS — I10 Essential (primary) hypertension: Secondary | ICD-10-CM | POA: Diagnosis not present

## 2021-02-27 DIAGNOSIS — M25531 Pain in right wrist: Secondary | ICD-10-CM | POA: Diagnosis not present

## 2021-02-27 NOTE — Progress Notes (Signed)
[facility] [place], [place] [address]  Internal MEDICINE  Telephone Visit  Patient Name: Ashley Weiss  [id number]  [id number]  Date of Service: 02/28/2021  I connected with the patient at 10:00 by telephone and verified the patients identity using two identifiers.   I discussed the limitations, risks, security and privacy concerns of performing an evaluation and management service by telephone and the availability of in person appointments. I also discussed with the patient that there may be a patient responsible charge related to the service.  The patient expressed understanding and agrees to proceed.    Chief Complaint  Patient presents with   Telephone Assessment    [phone]   Telephone Screen   Follow-up    ED for fall    Hypertension    HPI Patient is connected via telehealth patient presents today 36 after stating all her fall while going to the restroom patient landed on her right side and the daughter has been noticing that she has been having difficulty sleeping due to the pain She will have multiple x-rays done which were reported to be negative for any acute fracture and pathology   Current Medication: Outpatient Encounter Medications as of 02/27/2021  Medication Sig Note   albuterol (VENTOLIN HFA) 108 (90 Base) MCG/ACT inhaler Inhale 2 puffs into the lungs every 6 (six) hours as needed for wheezing or shortness of breath.    aspirin EC 81 MG tablet Take 1 tablet (81 mg total) by mouth daily.    b complex vitamins capsule Take 1 capsule by mouth daily.    Blood Glucose Monitoring Suppl (FREESTYLE FREEDOM LITE) w/Device KIT 1 kit by Does not apply route daily.    Calcium Citrate-Vitamin D (CITRACAL + D PO) Take by mouth.    cholecalciferol (VITAMIN D3) 25 MCG (1000 UNIT) tablet Take 1,000 Units by mouth daily.    denosumab (PROLIA) 60 MG/ML SOSY injection Inject 60 mg into the skin every 6 (six) months.    diltiazem (CARDIZEM CD) 120 MG 24  hr capsule Take 1 capsule (120 mg total) by mouth daily.    Fluocinolone Acetonide 0.01 % OIL Place 4 drops in ear(s) at bedtime as needed.     fluticasone (FLONASE) 50 MCG/ACT nasal spray PLACE 1 SPRAY INTO BOTH NOSTRILS DAILY.    fluticasone (FLOVENT HFA) 110 MCG/ACT inhaler TAKE 2 PUFFS BY MOUTH TWICE A DAY    furosemide (LASIX) 40 MG tablet TAKE 1 TABLET DAILY AS NEEDED FOR WEIGHT GAIN, INCREASED SHORTNESS OF BREATH, OR SWELLING    glucose blood (FREESTYLE LITE) test strip Use as directed once a daily Diag E11.65    guaiFENesin (MUCINEX) 600 MG 12 hr tablet Take by mouth 2 (two) times daily. 01/18/2021: PRN   ipratropium-albuterol (DUONEB) 0.5-2.5 (3) MG/3ML SOLN USE 1 VIAL 3 TIMES DAILY AS NEEDED FOR SHORTNESS OF BREATH DX J44.9    Lancets (FREESTYLE) lancets Use as directed once daily diag e11.65    levothyroxine (SYNTHROID) 25 MCG tablet TAKE 1 DAILY BY MOUTH IN THE MORNING ON A EMPTY STOMACH    metoprolol tartrate (LOPRESSOR) 100 MG tablet TAKE 1 TABLET BY MOUTH TWICE A DAY    metoprolol tartrate (LOPRESSOR) 25 MG tablet TAKE 1 TABLET (25 MG TOTAL) BY MOUTH DAILY AS NEEDED (FOR A FIB).    montelukast (SINGULAIR) 10 MG tablet TAKE 1 TABLET BY MOUTH EVERY DAY FOR COUGH    Multiple Vitamins-Minerals (MULTIVITAMIN WITH MINERALS) tablet Take 1 tablet by mouth daily.  oxycodone (OXY-IR) 5 MG capsule Take half to one tab as needed for pain tid    OXYGEN Inhale 2 L into the lungs every evening. Sleep with nasal cannula w/ O2 at 2lpm.    potassium chloride (KLOR-CON M10) 10 MEQ tablet TAKE 1 TABLET BY MOUTH EVERY DAY AS NEEDED ON DAYS WHEN TAKING LASIX    Respiratory Therapy Supplies (ONE FLOW SPIROMETER) DEVI Please use device as tolerated and according to instruction provided.    rosuvastatin (CRESTOR) 5 MG tablet Take 1 tablet (5 mg total) by mouth daily at 6 PM.    sertraline (ZOLOFT) 100 MG tablet TAKE 1 TABLET BY MOUTH EVERY DAY    No facility-administered encounter medications on file as  of 02/27/2021.    Surgical History: Past Surgical History:  Procedure Laterality Date   BREAST CYST ASPIRATION Left 20+ yrs ago   CARDIOVERSION N/A 11/11/2018   Procedure: CARDIOVERSION;  Surgeon: Nelva Bush, MD;  Location: [facility] ORS;  Service: Cardiovascular;  Laterality: N/A;   CATARACT EXTRACTION Bilateral 2005,2009   cyst removal-right shoulder  1972   otosclerosis Left 1988   hardening of bone, other 1989 redone   THYROIDECTOMY  1970   nodule and 1/2 bridge removal   WRIST SURGERY Left 11/29/2009    Medical History: Past Medical History:  Diagnosis Date   Arthritis    Asthma    Basal cell carcinoma 08/02/2014   R check ant to earlobe    Cancer ([place])    skin   Depression    DVT (deep venous thrombosis) ([place])    Hyperlipidemia    Hypertension    Phlebitis    Squamous cell carcinoma of skin 10/19/2014   right cheek    Family History: Family History  Problem Relation Age of Onset   Osteoarthritis Mother    Depression Father    Atrial fibrillation Sister    Multiple myeloma Brother    Cancer Sister    Hodgkin's lymphoma Brother    Prostate cancer Brother    Breast cancer Neg Hx     Social History   Socioeconomic History   Marital status: Widowed    Spouse name: Not on file   Number of children: Not on file   Years of education: Not on file   Highest education level: Not on file  Occupational History   Not on file  Tobacco Use   Smoking status: Never   Smokeless tobacco: Never  Vaping Use   Vaping Use: Never used  Substance and Sexual Activity   Alcohol use: No   Drug use: No   Sexual activity: Never  Other Topics Concern   Not on file  Social History Narrative   Not on file   Social Determinants of Health   Financial Resource Strain: Not on file  Food Insecurity: Not on file  Transportation Needs: Not on file  Physical Activity: Not on file  Stress: Not on file  Social Connections: Not on file  Intimate Partner Violence: Not on file       Review of Systems  Constitutional:  Negative for fatigue and fever.  HENT:  Negative for congestion, mouth sores and postnasal drip.   Respiratory:  Negative for cough.   Cardiovascular:  Negative for chest pain.  Genitourinary:  Negative for flank pain.  Musculoskeletal:  Positive for arthralgias and joint swelling.  Psychiatric/Behavioral: Negative.     Vital Signs: BP (!) 145/70   Pulse 70   Temp (!) 97.2 F (36.2 C)  Ht _0  (1.575 m)   Wt 133 lb 6.4 oz (60.5 kg)   SpO2 98% Comment: 1 LITRE  BMI 24.40 kg/m    Observation/Objective:  Daughter most of the time help with this visit and complain of pain    Assessment/Plan: 1. Acute pain of right wrist Patient is an to continue daily for pain with Tylenol cannot take longer standing NSAIDs, will add low-dose oxycodone for pain control - oxycodone (OXY-IR) 5 MG capsule; Take half to one tab as needed for pain tid  Dispense: 15 capsule; Refill: 0  2. Chronic asthma, mild intermittent, uncomplicated This is stable Of the chest x-ray did show that patient is in mild interstitial edema will monitor  3. Essential (primary) hypertension Blood pressure is slightly elevated however repeat numbers at home have been normal  General Counseling: Kensie verbalizes understanding of the findings of today's phone visit and agrees with plan of treatment. I have discussed any further diagnostic evaluation that may be needed or ordered today. We also reviewed her medications today. she has been encouraged to call the office with any questions or concerns that should arise related to todays visit.    No orders of the defined types were placed in this encounter.   Meds ordered this encounter  Medications   oxycodone (OXY-IR) 5 MG capsule    Sig: Take half to one tab as needed for pain tid    Dispense:  15 capsule    Refill:  0    Time spent:20 Minutes    Dr Lavera Guise Internal medicine

## 2021-02-28 ENCOUNTER — Telehealth: Payer: Self-pay

## 2021-02-28 ENCOUNTER — Other Ambulatory Visit: Payer: Self-pay | Admitting: Cardiovascular Disease

## 2021-02-28 MED ORDER — OXYCODONE HCL 5 MG PO CAPS: ORAL_CAPSULE | ORAL | 0 refills | Status: DC

## 2021-03-01 MED ORDER — OXYCODONE HCL 5 MG PO TABS: 2.5000 mg | ORAL_TABLET | Freq: Three times a day (TID) | ORAL | 0 refills | Status: DC | PRN

## 2021-03-01 NOTE — Telephone Encounter (Signed)
Med sent to pharmacy.

## 2021-03-22 DIAGNOSIS — H6063 Unspecified chronic otitis externa, bilateral: Secondary | ICD-10-CM | POA: Diagnosis not present

## 2021-03-22 DIAGNOSIS — H6123 Impacted cerumen, bilateral: Secondary | ICD-10-CM | POA: Diagnosis not present

## 2021-03-22 DIAGNOSIS — H903 Sensorineural hearing loss, bilateral: Secondary | ICD-10-CM | POA: Diagnosis not present

## 2021-03-26 ENCOUNTER — Encounter: Payer: Self-pay | Admitting: Physician Assistant

## 2021-03-26 ENCOUNTER — Telehealth (INDEPENDENT_AMBULATORY_CARE_PROVIDER_SITE_OTHER): Payer: Medicare Other | Admitting: Physician Assistant

## 2021-03-26 ENCOUNTER — Other Ambulatory Visit: Payer: Self-pay

## 2021-03-26 ENCOUNTER — Ambulatory Visit: Payer: Medicare Other | Admitting: Physician Assistant

## 2021-03-26 VITALS — BP 133/68 | HR 76 | Ht 62.0 in | Wt 131.4 lb

## 2021-03-26 DIAGNOSIS — J8410 Pulmonary fibrosis, unspecified: Secondary | ICD-10-CM | POA: Diagnosis not present

## 2021-03-26 DIAGNOSIS — J4520 Mild intermittent asthma, uncomplicated: Secondary | ICD-10-CM

## 2021-03-26 DIAGNOSIS — Z9981 Dependence on supplemental oxygen: Secondary | ICD-10-CM

## 2021-03-26 NOTE — Progress Notes (Signed)
[facility] [place], [place] [address]  Internal MEDICINE  Telephone Visit  Patient Name: Ashley Weiss  [id number]  [id number]  Date of Service: 03/26/2021  I connected with the patient at 11:27 by telephone and verified the patients identity using two identifiers.   I discussed the limitations, risks, security and privacy concerns of performing an evaluation and management service by telephone and the availability of in person appointments. I also discussed with the patient that there may be a patient responsible charge related to the service.  The patient expressed understanding and agrees to proceed.    Chief Complaint  Patient presents with   Telephone Assessment    Due to recent fall    Telephone Screen   Asthma   COPD    HPI Pt is here for routine pulmonary follow up visit with her daughter present. Done on video due to recent fall. She continues to use 1L oxygen during the day and 2 L at night; portable unit on 3L when up and walking. She still uses her Flovent inhaler BID and has not needed to use her rescue inhaler at all. Her main concern right now is pain from her fall and has her PCP follow up visit tomorrow.  Current Medication: Outpatient Encounter Medications as of 03/26/2021  Medication Sig Note   albuterol (VENTOLIN HFA) 108 (90 Base) MCG/ACT inhaler Inhale 2 puffs into the lungs every 6 (six) hours as needed for wheezing or shortness of breath.    aspirin EC 81 MG tablet Take 1 tablet (81 mg total) by mouth daily.    b complex vitamins capsule Take 1 capsule by mouth daily.    Blood Glucose Monitoring Suppl (FREESTYLE FREEDOM LITE) w/Device KIT 1 kit by Does not apply route daily.    Calcium Citrate-Vitamin D (CITRACAL + D PO) Take by mouth.    cholecalciferol (VITAMIN D3) 25 MCG (1000 UNIT) tablet Take 1,000 Units by mouth daily.    denosumab (PROLIA) 60 MG/ML SOSY injection Inject 60 mg into the skin every 6 (six) months.    diltiazem  (CARDIZEM CD) 120 MG 24 hr capsule TAKE 1 CAPSULE BY MOUTH EVERY DAY    Fluocinolone Acetonide 0.01 % OIL Place 4 drops in ear(s) at bedtime as needed.     fluticasone (FLONASE) 50 MCG/ACT nasal spray PLACE 1 SPRAY INTO BOTH NOSTRILS DAILY.    fluticasone (FLOVENT HFA) 110 MCG/ACT inhaler TAKE 2 PUFFS BY MOUTH TWICE A DAY    furosemide (LASIX) 40 MG tablet TAKE 1 TABLET DAILY AS NEEDED FOR WEIGHT GAIN, INCREASED SHORTNESS OF BREATH, OR SWELLING    glucose blood (FREESTYLE LITE) test strip Use as directed once a daily Diag E11.65    guaiFENesin (MUCINEX) 600 MG 12 hr tablet Take by mouth 2 (two) times daily. 01/18/2021: PRN   ipratropium-albuterol (DUONEB) 0.5-2.5 (3) MG/3ML SOLN USE 1 VIAL 3 TIMES DAILY AS NEEDED FOR SHORTNESS OF BREATH DX J44.9    Lancets (FREESTYLE) lancets Use as directed once daily diag e11.65    levothyroxine (SYNTHROID) 25 MCG tablet TAKE 1 DAILY BY MOUTH IN THE MORNING ON A EMPTY STOMACH    metoprolol tartrate (LOPRESSOR) 100 MG tablet TAKE 1 TABLET BY MOUTH TWICE A DAY    metoprolol tartrate (LOPRESSOR) 25 MG tablet TAKE 1 TABLET (25 MG TOTAL) BY MOUTH DAILY AS NEEDED (FOR A FIB).    montelukast (SINGULAIR) 10 MG tablet TAKE 1 TABLET BY MOUTH EVERY DAY FOR COUGH  Multiple Vitamins-Minerals (MULTIVITAMIN WITH MINERALS) tablet Take 1 tablet by mouth daily.    oxyCODONE (OXY IR/ROXICODONE) 5 MG immediate release tablet Take 0.5-1 tablets (2.5-5 mg total) by mouth 3 (three) times daily as needed for severe pain or moderate pain.    oxycodone (OXY-IR) 5 MG capsule Take half to one tab as needed for pain tid    OXYGEN Inhale 2 L into the lungs every evening. Sleep with nasal cannula w/ O2 at 2lpm.    potassium chloride (KLOR-CON M10) 10 MEQ tablet TAKE 1 TABLET BY MOUTH EVERY DAY AS NEEDED ON DAYS WHEN TAKING LASIX    Respiratory Therapy Supplies (ONE FLOW SPIROMETER) DEVI Please use device as tolerated and according to instruction provided.    rosuvastatin (CRESTOR) 5 MG  tablet TAKE 1 TABLET (5 MG TOTAL) BY MOUTH DAILY AT 6 PM.    sertraline (ZOLOFT) 100 MG tablet TAKE 1 TABLET BY MOUTH EVERY DAY    No facility-administered encounter medications on file as of 03/26/2021.    Surgical History: Past Surgical History:  Procedure Laterality Date   BREAST CYST ASPIRATION Left 20+ yrs ago   CARDIOVERSION N/A 11/11/2018   Procedure: CARDIOVERSION;  Surgeon: Nelva Bush, MD;  Location: [facility] ORS;  Service: Cardiovascular;  Laterality: N/A;   CATARACT EXTRACTION Bilateral 2005,2009   cyst removal-right shoulder  1972   otosclerosis Left 1988   hardening of bone, other 1989 redone   THYROIDECTOMY  1970   nodule and 1/2 bridge removal   WRIST SURGERY Left 11/29/2009    Medical History: Past Medical History:  Diagnosis Date   Arthritis    Asthma    Basal cell carcinoma 08/02/2014   R check ant to earlobe    Cancer ([place])    skin   Depression    DVT (deep venous thrombosis) ([place])    Hyperlipidemia    Hypertension    Phlebitis    Squamous cell carcinoma of skin 10/19/2014   right cheek    Family History: Family History  Problem Relation Age of Onset   Osteoarthritis Mother    Depression Father    Atrial fibrillation Sister    Multiple myeloma Brother    Cancer Sister    Hodgkin's lymphoma Brother    Prostate cancer Brother    Breast cancer Neg Hx     Social History   Socioeconomic History   Marital status: Widowed    Spouse name: Not on file   Number of children: Not on file   Years of education: Not on file   Highest education level: Not on file  Occupational History   Not on file  Tobacco Use   Smoking status: Never   Smokeless tobacco: Never  Vaping Use   Vaping Use: Never used  Substance and Sexual Activity   Alcohol use: No   Drug use: No   Sexual activity: Never  Other Topics Concern   Not on file  Social History Narrative   Not on file   Social Determinants of Health   Financial Resource Strain: Not on file   Food Insecurity: Not on file  Transportation Needs: Not on file  Physical Activity: Not on file  Stress: Not on file  Social Connections: Not on file  Intimate Partner Violence: Not on file      Review of Systems  Constitutional:  Negative for chills, fatigue and unexpected weight change.  HENT:  Negative for congestion, postnasal drip, rhinorrhea, sneezing and sore throat.   Eyes:  Negative  for redness.  Respiratory:  Negative for cough, chest tightness, shortness of breath and wheezing.   Cardiovascular:  Negative for chest pain and palpitations.  Gastrointestinal:  Negative for abdominal pain, constipation, diarrhea, nausea and vomiting.  Genitourinary:  Negative for dysuria and frequency.  Musculoskeletal:  Positive for arthralgias. Negative for back pain, joint swelling and neck pain.  Skin:  Negative for rash.  Neurological: Negative.  Negative for tremors and numbness.  Hematological:  Negative for adenopathy. Does not bruise/bleed easily.  Psychiatric/Behavioral:  Negative for behavioral problems (Depression), sleep disturbance and suicidal ideas. The patient is not nervous/anxious.    Vital Signs: BP 133/68   Pulse 76   Ht 5' 2"  (1.575 m)   Wt 131 lb 6.4 oz (59.6 kg)   SpO2 99% Comment: 1 litre  BMI 24.03 kg/m    Observation/Objective:  Pt is able to carry out conversation.   Assessment/Plan: 1. Chronic asthma, mild intermittent, uncomplicated Continue inhalers as before  2. Pulmonary fibrosis (HCC) Continue supplemental oxygen  3. Oxygen dependent Continue on oxygen   General Counseling: Indie verbalizes understanding of the findings of today's phone visit and agrees with plan of treatment. I have discussed any further diagnostic evaluation that may be needed or ordered today. We also reviewed her medications today. she has been encouraged to call the office with any questions or concerns that should arise related to todays visit.    No orders of the  defined types were placed in this encounter.   No orders of the defined types were placed in this encounter.   Time spent:25 Minutes    Dr Lavera Guise Internal medicine

## 2021-03-27 ENCOUNTER — Encounter: Payer: Self-pay | Admitting: Internal Medicine

## 2021-03-27 ENCOUNTER — Other Ambulatory Visit: Payer: Self-pay

## 2021-03-27 ENCOUNTER — Telehealth (INDEPENDENT_AMBULATORY_CARE_PROVIDER_SITE_OTHER): Payer: Medicare Other | Admitting: Internal Medicine

## 2021-03-27 VITALS — BP 136/78 | HR 83 | Resp 16 | Ht 62.0 in | Wt 132.2 lb

## 2021-03-27 DIAGNOSIS — I10 Essential (primary) hypertension: Secondary | ICD-10-CM

## 2021-03-27 DIAGNOSIS — J4520 Mild intermittent asthma, uncomplicated: Secondary | ICD-10-CM

## 2021-03-27 DIAGNOSIS — M25531 Pain in right wrist: Secondary | ICD-10-CM

## 2021-03-27 NOTE — Progress Notes (Signed)
[facility] [place], [place] [address]  Internal MEDICINE  Telephone Visit  Patient Name: Ashley Weiss  [id number]  [id number]  Date of Service: 04/26/2021  I connected with the patient at 1000 by telephone and verified the patients identity using two identifiers.   I discussed the limitations, risks, security and privacy concerns of performing an evaluation and management service by telephone and the availability of in person appointments. I also discussed with the patient that there may be a patient responsible charge related to the service.  The patient expressed understanding and agrees to proceed.    Chief Complaint  Patient presents with   Telephone Assessment    [id number]   Telephone Screen   Depression   Hyperlipidemia   Hypertension   Back Pain    HPI  Patient is connected via televisit due to for routine follow-up patient recently fell and had to go to the emergency room she is complaining of back pain but daughter most of the conversation is done by her daughter. She has been staying with her daughter due to increased level of care had difficulty sleeping at night due to pain in her wrist   Asthma is well controlled blood pressure was slightly elevated  Current Medication: Outpatient Encounter Medications as of 03/27/2021  Medication Sig Note   albuterol (VENTOLIN HFA) 108 (90 Base) MCG/ACT inhaler Inhale 2 puffs into the lungs every 6 (six) hours as needed for wheezing or shortness of breath.    aspirin EC 81 MG tablet Take 1 tablet (81 mg total) by mouth daily.    b complex vitamins capsule Take 1 capsule by mouth daily.    Blood Glucose Monitoring Suppl (FREESTYLE FREEDOM LITE) w/Device KIT 1 kit by Does not apply route daily.    Calcium Citrate-Vitamin D (CITRACAL + D PO) Take by mouth.    cholecalciferol (VITAMIN D3) 25 MCG (1000 UNIT) tablet Take 1,000 Units by mouth daily.    denosumab (PROLIA) 60 MG/ML SOSY injection Inject 60 mg  into the skin every 6 (six) months.    diltiazem (CARDIZEM CD) 120 MG 24 hr capsule TAKE 1 CAPSULE BY MOUTH EVERY DAY    Fluocinolone Acetonide 0.01 % OIL Place 4 drops in ear(s) at bedtime as needed.     fluticasone (FLOVENT HFA) 110 MCG/ACT inhaler TAKE 2 PUFFS BY MOUTH TWICE A DAY    furosemide (LASIX) 40 MG tablet TAKE 1 TABLET DAILY AS NEEDED FOR WEIGHT GAIN, INCREASED SHORTNESS OF BREATH, OR SWELLING    glucose blood (FREESTYLE LITE) test strip Use as directed once a daily Diag E11.65    guaiFENesin (MUCINEX) 600 MG 12 hr tablet Take by mouth 2 (two) times daily. 01/18/2021: PRN   ipratropium-albuterol (DUONEB) 0.5-2.5 (3) MG/3ML SOLN USE 1 VIAL 3 TIMES DAILY AS NEEDED FOR SHORTNESS OF BREATH DX J44.9    Lancets (FREESTYLE) lancets Use as directed once daily diag e11.65    levothyroxine (SYNTHROID) 25 MCG tablet TAKE 1 DAILY BY MOUTH IN THE MORNING ON A EMPTY STOMACH    metoprolol tartrate (LOPRESSOR) 100 MG tablet TAKE 1 TABLET BY MOUTH TWICE A DAY    metoprolol tartrate (LOPRESSOR) 25 MG tablet TAKE 1 TABLET (25 MG TOTAL) BY MOUTH DAILY AS NEEDED (FOR A FIB).    montelukast (SINGULAIR) 10 MG tablet TAKE 1 TABLET BY MOUTH EVERY DAY FOR COUGH    Multiple Vitamins-Minerals (MULTIVITAMIN WITH MINERALS) tablet Take 1 tablet by mouth daily.    oxyCODONE (OXY  IR/ROXICODONE) 5 MG immediate release tablet Take 0.5-1 tablets (2.5-5 mg total) by mouth 3 (three) times daily as needed for severe pain or moderate pain.    oxycodone (OXY-IR) 5 MG capsule Take half to one tab as needed for pain tid    OXYGEN Inhale 2 L into the lungs every evening. Sleep with nasal cannula w/ O2 at 2lpm.    potassium chloride (KLOR-CON M10) 10 MEQ tablet TAKE 1 TABLET BY MOUTH EVERY DAY AS NEEDED ON DAYS WHEN TAKING LASIX    Respiratory Therapy Supplies (ONE FLOW SPIROMETER) DEVI Please use device as tolerated and according to instruction provided.    rosuvastatin (CRESTOR) 5 MG tablet TAKE 1 TABLET (5 MG TOTAL) BY MOUTH  DAILY AT 6 PM.    sertraline (ZOLOFT) 100 MG tablet TAKE 1 TABLET BY MOUTH EVERY DAY    [DISCONTINUED] fluticasone (FLONASE) 50 MCG/ACT nasal spray PLACE 1 SPRAY INTO BOTH NOSTRILS DAILY.    No facility-administered encounter medications on file as of 03/27/2021.    Surgical History: Past Surgical History:  Procedure Laterality Date   BREAST CYST ASPIRATION Left 20+ yrs ago   CARDIOVERSION N/A 11/11/2018   Procedure: CARDIOVERSION;  Surgeon: Nelva Bush, MD;  Location: [facility] ORS;  Service: Cardiovascular;  Laterality: N/A;   CATARACT EXTRACTION Bilateral 2005,2009   cyst removal-right shoulder  1972   otosclerosis Left 1988   hardening of bone, other 1989 redone   THYROIDECTOMY  1970   nodule and 1/2 bridge removal   WRIST SURGERY Left 11/29/2009    Medical History: Past Medical History:  Diagnosis Date   Arthritis    Asthma    Basal cell carcinoma 08/02/2014   R check ant to earlobe    Cancer ([place])    skin   Depression    DVT (deep venous thrombosis) ([place])    Hyperlipidemia    Hypertension    Phlebitis    Squamous cell carcinoma of skin 10/19/2014   right cheek    Family History: Family History  Problem Relation Age of Onset   Osteoarthritis Mother    Depression Father    Atrial fibrillation Sister    Multiple myeloma Brother    Cancer Sister    Hodgkin's lymphoma Brother    Prostate cancer Brother    Breast cancer Neg Hx     Social History   Socioeconomic History   Marital status: Widowed    Spouse name: Not on file   Number of children: Not on file   Years of education: Not on file   Highest education level: Not on file  Occupational History   Not on file  Tobacco Use   Smoking status: Never   Smokeless tobacco: Never  Vaping Use   Vaping Use: Never used  Substance and Sexual Activity   Alcohol use: No   Drug use: No   Sexual activity: Never  Other Topics Concern   Not on file  Social History Narrative   Not on file   Social  Determinants of Health   Financial Resource Strain: Not on file  Food Insecurity: Not on file  Transportation Needs: Not on file  Physical Activity: Not on file  Stress: Not on file  Social Connections: Not on file  Intimate Partner Violence: Not on file      Review of Systems  Constitutional:  Negative for fatigue and fever.  HENT:  Negative for congestion, mouth sores and postnasal drip.   Respiratory:  Negative for cough.   Cardiovascular:  Negative for chest pain.  Genitourinary:  Negative for flank pain.  Musculoskeletal:  Positive for back pain.  Psychiatric/Behavioral: Negative.     Vital Signs: BP 136/78   Pulse 83   Resp 16   Ht 5' 2"  (1.575 m)   Wt 132 lb 3.2 oz (60 kg)   SpO2 99% Comment: 1 litre  BMI 24.18 kg/m    Observation/Objective: Patient is able to communicate well but does complain of pain    Assessment/Plan: 1. Chronic asthma, mild intermittent, uncomplicated Stable and controlled  2. Acute pain of right wrist Prescribe low-dose oxycodone for pain control and monitor as she will see orthopedic  3. Essential (primary) hypertension Patient is instructed to recheck blood pressure and call if she has elevated numbers  General Counseling: Lorinda verbalizes understanding of the findings of today's phone visit and agrees with plan of treatment. I have discussed any further diagnostic evaluation that may be needed or ordered today. We also reviewed her medications today. she has been encouraged to call the office with any questions or concerns that should arise related to todays visit.    Time spent:20 Minutes    Dr Lavera Guise Internal medicine

## 2021-03-28 ENCOUNTER — Telehealth: Payer: Medicare Other | Admitting: Internal Medicine

## 2021-03-29 ENCOUNTER — Telehealth: Payer: Self-pay

## 2021-03-29 NOTE — Telephone Encounter (Signed)
Faxed signed plan of care for home health to Alric Ran at [phone]. Held at [employer].

## 2021-04-10 DIAGNOSIS — M25512 Pain in left shoulder: Secondary | ICD-10-CM | POA: Diagnosis not present

## 2021-04-17 ENCOUNTER — Other Ambulatory Visit: Payer: Self-pay | Admitting: Internal Medicine

## 2021-04-26 ENCOUNTER — Telehealth: Payer: Self-pay

## 2021-04-26 DIAGNOSIS — N390 Urinary tract infection, site not specified: Secondary | ICD-10-CM | POA: Diagnosis not present

## 2021-04-26 DIAGNOSIS — R410 Disorientation, unspecified: Secondary | ICD-10-CM | POA: Diagnosis not present

## 2021-04-26 DIAGNOSIS — R918 Other nonspecific abnormal finding of lung field: Secondary | ICD-10-CM | POA: Diagnosis not present

## 2021-04-26 DIAGNOSIS — H70893 Other mastoiditis and related conditions, bilateral: Secondary | ICD-10-CM | POA: Diagnosis not present

## 2021-04-26 NOTE — Telephone Encounter (Signed)
Pt's daughter called and was advising that pt had a bad night last night 04/25/21 twitching hands and feet, also had change in mental status. Per Dr Humphrey Rolls she felt that pt needed to go to ER to be evaluated.    Daughter was also asking about maybe getting hospice referral but per DFK she can discuss on her video visit Tuesday.

## 2021-04-30 ENCOUNTER — Encounter: Payer: Self-pay | Admitting: Internal Medicine

## 2021-05-01 ENCOUNTER — Encounter: Payer: Self-pay | Admitting: Internal Medicine

## 2021-05-01 ENCOUNTER — Telehealth (INDEPENDENT_AMBULATORY_CARE_PROVIDER_SITE_OTHER): Payer: Medicare Other | Admitting: Internal Medicine

## 2021-05-01 VITALS — BP 179/87 | HR 78 | Ht 62.0 in | Wt 131.4 lb

## 2021-05-01 DIAGNOSIS — R269 Unspecified abnormalities of gait and mobility: Secondary | ICD-10-CM | POA: Diagnosis not present

## 2021-05-01 DIAGNOSIS — N3000 Acute cystitis without hematuria: Secondary | ICD-10-CM | POA: Diagnosis not present

## 2021-05-01 DIAGNOSIS — J4520 Mild intermittent asthma, uncomplicated: Secondary | ICD-10-CM | POA: Diagnosis not present

## 2021-05-01 DIAGNOSIS — I5030 Unspecified diastolic (congestive) heart failure: Secondary | ICD-10-CM | POA: Diagnosis not present

## 2021-05-01 DIAGNOSIS — I480 Paroxysmal atrial fibrillation: Secondary | ICD-10-CM

## 2021-05-01 DIAGNOSIS — E871 Hypo-osmolality and hyponatremia: Secondary | ICD-10-CM

## 2021-05-01 DIAGNOSIS — E119 Type 2 diabetes mellitus without complications: Secondary | ICD-10-CM

## 2021-05-01 DIAGNOSIS — I700 Atherosclerosis of aorta: Secondary | ICD-10-CM

## 2021-05-01 NOTE — Progress Notes (Addendum)
[facility] [place], [place] [address]  Internal MEDICINE  Telephone Visit  Patient Name: Ashley Weiss  [id number]  [id number]  Date of Service: 05/03/2021  I connected with the patient at 1010 am  by telephone and verified the patients identity using two identifiers.   I discussed the limitations, risks, security and privacy concerns of performing an evaluation and management service by telephone and the availability of in person appointments. I also discussed with the patient that there may be a patient responsible charge related to the service.  The patient expressed understanding and agrees to proceed.    Chief Complaint  Patient presents with   Telephone Assessment    [id number]   Telephone Screen   Urinary Tract Infection   Hyperlipidemia   Hypertension    HPI Pt was seen in ED for mental status changes, was found to have a UTI. Feels better. Her sodium is slightly low Her CXR has the following findings. 1. Mild interstitial prominence diffusely could reflect chronic interstitial change though mild pulmonary edema or atypical pneumonia could also be considered in the appropriate clinical setting.  2. No consolidation   Current Medication: Outpatient Encounter Medications as of 05/01/2021  Medication Sig Note   albuterol (VENTOLIN HFA) 108 (90 Base) MCG/ACT inhaler Inhale 2 puffs into the lungs every 6 (six) hours as needed for wheezing or shortness of breath.    aspirin EC 81 MG tablet Take 1 tablet (81 mg total) by mouth daily.    b complex vitamins capsule Take 1 capsule by mouth daily.    Blood Glucose Monitoring Suppl (FREESTYLE FREEDOM LITE) w/Device KIT 1 kit by Does not apply route daily.    Calcium Citrate-Vitamin D (CITRACAL + D PO) Take by mouth.    cefdinir (OMNICEF) 300 MG capsule Take by mouth.    cholecalciferol (VITAMIN D3) 25 MCG (1000 UNIT) tablet Take 1,000 Units by mouth daily.    denosumab (PROLIA) 60 MG/ML SOSY injection  Inject 60 mg into the skin every 6 (six) months.    diltiazem (CARDIZEM CD) 120 MG 24 hr capsule TAKE 1 CAPSULE BY MOUTH EVERY DAY    Fluocinolone Acetonide 0.01 % OIL Place 4 drops in ear(s) at bedtime as needed.     fluticasone (FLONASE) 50 MCG/ACT nasal spray SPRAY 1 SPRAY INTO BOTH NOSTRILS DAILY.    fluticasone (FLOVENT HFA) 110 MCG/ACT inhaler TAKE 2 PUFFS BY MOUTH TWICE A DAY    furosemide (LASIX) 40 MG tablet TAKE 1 TABLET DAILY AS NEEDED FOR WEIGHT GAIN, INCREASED SHORTNESS OF BREATH, OR SWELLING    glucose blood (FREESTYLE LITE) test strip Use as directed once a daily Diag E11.65    guaiFENesin (MUCINEX) 600 MG 12 hr tablet Take by mouth 2 (two) times daily. 01/18/2021: PRN   ipratropium-albuterol (DUONEB) 0.5-2.5 (3) MG/3ML SOLN USE 1 VIAL 3 TIMES DAILY AS NEEDED FOR SHORTNESS OF BREATH DX J44.9    Lancets (FREESTYLE) lancets Use as directed once daily diag e11.65    levothyroxine (SYNTHROID) 25 MCG tablet TAKE 1 DAILY BY MOUTH IN THE MORNING ON A EMPTY STOMACH    metoprolol tartrate (LOPRESSOR) 100 MG tablet TAKE 1 TABLET BY MOUTH TWICE A DAY    metoprolol tartrate (LOPRESSOR) 25 MG tablet TAKE 1 TABLET (25 MG TOTAL) BY MOUTH DAILY AS NEEDED (FOR A FIB).    montelukast (SINGULAIR) 10 MG tablet TAKE 1 TABLET BY MOUTH EVERY DAY FOR COUGH    Multiple Vitamins-Minerals (MULTIVITAMIN WITH MINERALS)  tablet Take 1 tablet by mouth daily.    oxyCODONE (OXY IR/ROXICODONE) 5 MG immediate release tablet Take 0.5-1 tablets (2.5-5 mg total) by mouth 3 (three) times daily as needed for severe pain or moderate pain.    oxycodone (OXY-IR) 5 MG capsule Take half to one tab as needed for pain tid    OXYGEN Inhale 2 L into the lungs every evening. Sleep with nasal cannula w/ O2 at 2lpm.    potassium chloride (KLOR-CON M10) 10 MEQ tablet TAKE 1 TABLET BY MOUTH EVERY DAY AS NEEDED ON DAYS WHEN TAKING LASIX    Respiratory Therapy Supplies (ONE FLOW SPIROMETER) DEVI Please use device as tolerated and according  to instruction provided.    rosuvastatin (CRESTOR) 5 MG tablet TAKE 1 TABLET (5 MG TOTAL) BY MOUTH DAILY AT 6 PM.    sertraline (ZOLOFT) 100 MG tablet TAKE 1 TABLET BY MOUTH EVERY DAY    No facility-administered encounter medications on file as of 05/01/2021.    Surgical History: Past Surgical History:  Procedure Laterality Date   BREAST CYST ASPIRATION Left 20+ yrs ago   CARDIOVERSION N/A 11/11/2018   Procedure: CARDIOVERSION;  Surgeon: Nelva Bush, MD;  Location: [facility] ORS;  Service: Cardiovascular;  Laterality: N/A;   CATARACT EXTRACTION Bilateral 2005,2009   cyst removal-right shoulder  1972   otosclerosis Left 1988   hardening of bone, other 1989 redone   THYROIDECTOMY  1970   nodule and 1/2 bridge removal   WRIST SURGERY Left 11/29/2009    Medical History: Past Medical History:  Diagnosis Date   Arthritis    Asthma    Basal cell carcinoma 08/02/2014   R check ant to earlobe    Cancer ([place])    skin   Depression    DVT (deep venous thrombosis) ([place])    Hyperlipidemia    Hypertension    Phlebitis    Squamous cell carcinoma of skin 10/19/2014   right cheek    Family History: Family History  Problem Relation Age of Onset   Osteoarthritis Mother    Depression Father    Atrial fibrillation Sister    Multiple myeloma Brother    Cancer Sister    Hodgkin's lymphoma Brother    Prostate cancer Brother    Breast cancer Neg Hx     Social History   Socioeconomic History   Marital status: Widowed    Spouse name: Not on file   Number of children: Not on file   Years of education: Not on file   Highest education level: Not on file  Occupational History   Not on file  Tobacco Use   Smoking status: Never   Smokeless tobacco: Never  Vaping Use   Vaping Use: Never used  Substance and Sexual Activity   Alcohol use: No   Drug use: No   Sexual activity: Never  Other Topics Concern   Not on file  Social History Narrative   Not on file   Social Determinants  of Health   Financial Resource Strain: Not on file  Food Insecurity: Not on file  Transportation Needs: Not on file  Physical Activity: Not on file  Stress: Not on file  Social Connections: Not on file  Intimate Partner Violence: Not on file      Review of Systems  Constitutional:  Negative for fatigue and fever.  HENT:  Negative for congestion, mouth sores and postnasal drip.   Respiratory:  Negative for cough.   Cardiovascular:  Negative for chest pain.  Genitourinary:  Negative for flank pain.  Musculoskeletal:  Positive for back pain and gait problem.  Psychiatric/Behavioral: Negative.     Vital Signs: BP (!) 179/87    Pulse 78    Ht 5' 2"  (1.575 m)    Wt 131 lb 6.4 oz (59.6 kg)    SpO2 98% Comment: 1 litre   BMI 24.03 kg/m    Observation/Objective: NAD    Assessment/Plan: 1. Acute cystitis without hematuria Patient is due to complete course of antibiotics and will repeat her urine analysis in few weeks - CBC with Differential/Platelet - Lipid Panel With LDL/HDL Ratio - TSH - T4, free - Comprehensive metabolic panel - Urinalysis  2. Hyponatremia Slightly low sodium, her chest x-ray is also reviewed with the daughter will start low-dose Lasix 20 mg once or twice a week for now and monitor repeat sodium level as well - CBC with Differential/Platelet - Lipid Panel With LDL/HDL Ratio - TSH - T4, free - Comprehensive metabolic panel - Urinalysis  3. Gait difficulty Patient will get benefit from PT for gait and balance training and to avoid falls. Pt does have osteoarthritis and back pain, will order for physical therapy evaluate and treat as indicated - Ambulatory referral to Physical Therapy  4. Chronic asthma, mild intermittent, uncomplicated Her asthma is stable we will continue to monitor  5. Paroxysmal atrial fibrillation [facility]) Patient is stable is followed by cardiology patient is on statin therapy  6. Diastolic CHF with preserved left ventricular  function, NYHA class 2 ([place]) Will repeat chest x-ray on next follow-up  General Counseling: Teonia verbalizes understanding of the findings of today's phone visit and agrees with plan of treatment. I have discussed any further diagnostic evaluation that may be needed or ordered today. We also reviewed her medications today. she has been encouraged to call the office with any questions or concerns that should arise related to todays visit.    Orders Placed This Encounter  Procedures   CBC with Differential/Platelet   Lipid Panel With LDL/HDL Ratio   TSH   T4, free   Comprehensive metabolic panel   Urinalysis   Ambulatory referral to Physical Therapy    No orders of the defined types were placed in this encounter.   Time spent:25 Minutes    Dr Lavera Guise Internal medicine

## 2021-05-03 ENCOUNTER — Telehealth: Payer: Self-pay

## 2021-05-03 NOTE — Telephone Encounter (Signed)
05/01/21 spoke to pt's daughter and informed her that we mailed PT order and lab orders to her for Pt.

## 2021-05-04 ENCOUNTER — Telehealth: Payer: Self-pay | Admitting: Internal Medicine

## 2021-05-04 NOTE — Chronic Care Management (AMB) (Signed)
°  Chronic Care Management   Note  05/04/2021 Name: Ashley Weiss MRN: [id number] DOB: 23-Jul-1919  Ashley Weiss is a 88 y.o. year old female who is a primary care patient of Lavera Guise, MD. I reached out to [name] by phone today in response to a referral sent by Ms. George Mason PCP, Lavera Guise, MD.   Ashley Weiss was given information about Chronic Care Management services today including:  CCM service includes personalized support from designated clinical staff supervised by her physician, including individualized plan of care and coordination with other care providers 24/7 contact phone numbers for assistance for urgent and routine care needs. Service will only be billed when office clinical staff spend 20 minutes or more in a month to coordinate care. Only one practitioner may furnish and bill the service in a calendar month. The patient may stop CCM services at any time (effective at the end of the month) by phone call to the office staff.   DONNA/ DAUGHTER verbally agreed to assistance and services provided by embedded care coordination/care management team today.  Follow up plan: [place]

## 2021-05-04 NOTE — Progress Notes (Signed)
ERROR

## 2021-05-11 ENCOUNTER — Ambulatory Visit: Payer: Medicare Other | Admitting: Physician Assistant

## 2021-05-11 ENCOUNTER — Telehealth: Payer: Self-pay

## 2021-05-11 DIAGNOSIS — I110 Hypertensive heart disease with heart failure: Secondary | ICD-10-CM | POA: Diagnosis not present

## 2021-05-11 DIAGNOSIS — Z7982 Long term (current) use of aspirin: Secondary | ICD-10-CM | POA: Diagnosis not present

## 2021-05-11 DIAGNOSIS — J4520 Mild intermittent asthma, uncomplicated: Secondary | ICD-10-CM | POA: Diagnosis not present

## 2021-05-11 DIAGNOSIS — E871 Hypo-osmolality and hyponatremia: Secondary | ICD-10-CM | POA: Diagnosis not present

## 2021-05-11 DIAGNOSIS — Z85828 Personal history of other malignant neoplasm of skin: Secondary | ICD-10-CM | POA: Diagnosis not present

## 2021-05-11 DIAGNOSIS — M1990 Unspecified osteoarthritis, unspecified site: Secondary | ICD-10-CM | POA: Diagnosis not present

## 2021-05-11 DIAGNOSIS — I5030 Unspecified diastolic (congestive) heart failure: Secondary | ICD-10-CM | POA: Diagnosis not present

## 2021-05-11 DIAGNOSIS — N3000 Acute cystitis without hematuria: Secondary | ICD-10-CM | POA: Diagnosis not present

## 2021-05-11 DIAGNOSIS — E785 Hyperlipidemia, unspecified: Secondary | ICD-10-CM | POA: Diagnosis not present

## 2021-05-11 DIAGNOSIS — Z7951 Long term (current) use of inhaled steroids: Secondary | ICD-10-CM | POA: Diagnosis not present

## 2021-05-11 DIAGNOSIS — I480 Paroxysmal atrial fibrillation: Secondary | ICD-10-CM | POA: Diagnosis not present

## 2021-05-11 DIAGNOSIS — F32A Depression, unspecified: Secondary | ICD-10-CM | POA: Diagnosis not present

## 2021-05-11 DIAGNOSIS — Z9981 Dependence on supplemental oxygen: Secondary | ICD-10-CM | POA: Diagnosis not present

## 2021-05-11 NOTE — Telephone Encounter (Signed)
05/09/2021.Marland KitchenMarland KitchenFaxed office notes , demographics to [facility] [place]

## 2021-05-11 NOTE — Telephone Encounter (Signed)
Ashley Weiss [phone]) from [facility] called requesting verbal orders for  PT @ home 1 x a week for 9 weeks OT eval for left clavicle pain RN for disease management Approval for orders were given to [facility]

## 2021-05-14 DIAGNOSIS — N3000 Acute cystitis without hematuria: Secondary | ICD-10-CM | POA: Diagnosis not present

## 2021-05-14 DIAGNOSIS — I5030 Unspecified diastolic (congestive) heart failure: Secondary | ICD-10-CM | POA: Diagnosis not present

## 2021-05-14 DIAGNOSIS — J4520 Mild intermittent asthma, uncomplicated: Secondary | ICD-10-CM | POA: Diagnosis not present

## 2021-05-14 DIAGNOSIS — I110 Hypertensive heart disease with heart failure: Secondary | ICD-10-CM | POA: Diagnosis not present

## 2021-05-14 DIAGNOSIS — M1990 Unspecified osteoarthritis, unspecified site: Secondary | ICD-10-CM | POA: Diagnosis not present

## 2021-05-14 DIAGNOSIS — E871 Hypo-osmolality and hyponatremia: Secondary | ICD-10-CM | POA: Diagnosis not present

## 2021-05-16 DIAGNOSIS — I5030 Unspecified diastolic (congestive) heart failure: Secondary | ICD-10-CM | POA: Diagnosis not present

## 2021-05-16 DIAGNOSIS — N3000 Acute cystitis without hematuria: Secondary | ICD-10-CM | POA: Diagnosis not present

## 2021-05-16 DIAGNOSIS — I110 Hypertensive heart disease with heart failure: Secondary | ICD-10-CM | POA: Diagnosis not present

## 2021-05-16 DIAGNOSIS — M1990 Unspecified osteoarthritis, unspecified site: Secondary | ICD-10-CM | POA: Diagnosis not present

## 2021-05-16 DIAGNOSIS — J4520 Mild intermittent asthma, uncomplicated: Secondary | ICD-10-CM | POA: Diagnosis not present

## 2021-05-16 DIAGNOSIS — E871 Hypo-osmolality and hyponatremia: Secondary | ICD-10-CM | POA: Diagnosis not present

## 2021-05-23 DIAGNOSIS — I110 Hypertensive heart disease with heart failure: Secondary | ICD-10-CM | POA: Diagnosis not present

## 2021-05-23 DIAGNOSIS — M1990 Unspecified osteoarthritis, unspecified site: Secondary | ICD-10-CM | POA: Diagnosis not present

## 2021-05-23 DIAGNOSIS — J4520 Mild intermittent asthma, uncomplicated: Secondary | ICD-10-CM | POA: Diagnosis not present

## 2021-05-23 DIAGNOSIS — E871 Hypo-osmolality and hyponatremia: Secondary | ICD-10-CM | POA: Diagnosis not present

## 2021-05-23 DIAGNOSIS — I5030 Unspecified diastolic (congestive) heart failure: Secondary | ICD-10-CM | POA: Diagnosis not present

## 2021-05-23 DIAGNOSIS — N3000 Acute cystitis without hematuria: Secondary | ICD-10-CM | POA: Diagnosis not present

## 2021-05-25 DIAGNOSIS — I5030 Unspecified diastolic (congestive) heart failure: Secondary | ICD-10-CM | POA: Diagnosis not present

## 2021-05-25 DIAGNOSIS — N3000 Acute cystitis without hematuria: Secondary | ICD-10-CM | POA: Diagnosis not present

## 2021-05-25 DIAGNOSIS — J4520 Mild intermittent asthma, uncomplicated: Secondary | ICD-10-CM | POA: Diagnosis not present

## 2021-05-25 DIAGNOSIS — M1990 Unspecified osteoarthritis, unspecified site: Secondary | ICD-10-CM | POA: Diagnosis not present

## 2021-05-25 DIAGNOSIS — E871 Hypo-osmolality and hyponatremia: Secondary | ICD-10-CM | POA: Diagnosis not present

## 2021-05-25 DIAGNOSIS — I110 Hypertensive heart disease with heart failure: Secondary | ICD-10-CM | POA: Diagnosis not present

## 2021-05-28 ENCOUNTER — Telehealth: Payer: Self-pay

## 2021-05-28 ENCOUNTER — Other Ambulatory Visit: Payer: Self-pay | Admitting: Internal Medicine

## 2021-05-28 DIAGNOSIS — I700 Atherosclerosis of aorta: Secondary | ICD-10-CM | POA: Diagnosis not present

## 2021-05-28 DIAGNOSIS — E871 Hypo-osmolality and hyponatremia: Secondary | ICD-10-CM | POA: Diagnosis not present

## 2021-05-28 DIAGNOSIS — I5030 Unspecified diastolic (congestive) heart failure: Secondary | ICD-10-CM | POA: Diagnosis not present

## 2021-05-28 DIAGNOSIS — N3000 Acute cystitis without hematuria: Secondary | ICD-10-CM | POA: Diagnosis not present

## 2021-05-28 DIAGNOSIS — E119 Type 2 diabetes mellitus without complications: Secondary | ICD-10-CM | POA: Diagnosis not present

## 2021-05-28 DIAGNOSIS — R269 Unspecified abnormalities of gait and mobility: Secondary | ICD-10-CM | POA: Diagnosis not present

## 2021-05-28 DIAGNOSIS — I480 Paroxysmal atrial fibrillation: Secondary | ICD-10-CM | POA: Diagnosis not present

## 2021-05-28 DIAGNOSIS — J4520 Mild intermittent asthma, uncomplicated: Secondary | ICD-10-CM | POA: Diagnosis not present

## 2021-05-28 NOTE — Telephone Encounter (Signed)
Home health plan of care signed by [facility] and faxed back to [facility] at [phone] and placed in home health folder at front.

## 2021-05-29 LAB — T4, FREE: Free T4: 1.14 ng/dL (ref 0.82–1.77)

## 2021-05-29 LAB — COMPREHENSIVE METABOLIC PANEL
ALT: 11 IU/L (ref 0–32)
AST: 18 IU/L (ref 0–40)
Albumin/Globulin Ratio: 1 — ABNORMAL LOW (ref 1.2–2.2)
Albumin: 3.9 g/dL (ref 3.5–4.6)
Alkaline Phosphatase: 87 IU/L (ref 44–121)
BUN/Creatinine Ratio: 23 (ref 12–28)
BUN: 13 mg/dL (ref 10–36)
Bilirubin Total: 0.4 mg/dL (ref 0.0–1.2)
CO2: 30 mmol/L — ABNORMAL HIGH (ref 20–29)
Calcium: 9.4 mg/dL (ref 8.7–10.3)
Chloride: 94 mmol/L — ABNORMAL LOW (ref 96–106)
Creatinine, Ser: 0.56 mg/dL — ABNORMAL LOW (ref 0.57–1.00)
Globulin, Total: 3.8 g/dL (ref 1.5–4.5)
Glucose: 129 mg/dL — ABNORMAL HIGH (ref 70–99)
Potassium: 4.9 mmol/L (ref 3.5–5.2)
Sodium: 139 mmol/L (ref 134–144)
Total Protein: 7.7 g/dL (ref 6.0–8.5)
eGFR: 82 mL/min/{1.73_m2} (ref >59)

## 2021-05-29 LAB — CBC WITH DIFFERENTIAL/PLATELET
Basophils Absolute: 0.1 10*3/uL (ref 0.0–0.2)
Basos: 1 %
EOS (ABSOLUTE): 0.5 10*3/uL — ABNORMAL HIGH (ref 0.0–0.4)
Eos: 5 %
Hematocrit: 37.8 % (ref 34.0–46.6)
Hemoglobin: 12.7 g/dL (ref 11.1–15.9)
Immature Grans (Abs): 0 10*3/uL (ref 0.0–0.1)
Immature Granulocytes: 0 %
Lymphocytes Absolute: 1.9 10*3/uL (ref 0.7–3.1)
Lymphs: 19 %
MCH: 32.2 pg (ref 26.6–33.0)
MCHC: 33.6 g/dL (ref 31.5–35.7)
MCV: 96 fL (ref 79–97)
Monocytes Absolute: 1 10*3/uL — ABNORMAL HIGH (ref 0.1–0.9)
Monocytes: 10 %
Neutrophils Absolute: 6.6 10*3/uL (ref 1.4–7.0)
Neutrophils: 65 %
Platelets: 218 10*3/uL (ref 150–450)
RBC: 3.94 x10E6/uL (ref 3.77–5.28)
RDW: 12.6 % (ref 11.7–15.4)
WBC: 10.1 10*3/uL (ref 3.4–10.8)

## 2021-05-29 LAB — LIPID PANEL WITH LDL/HDL RATIO
Cholesterol, Total: 130 mg/dL (ref 100–199)
HDL: 51 mg/dL (ref >39)
LDL Chol Calc (NIH): 61 mg/dL (ref 0–99)
LDL/HDL Ratio: 1.2 ratio (ref 0.0–3.2)
Triglycerides: 97 mg/dL (ref 0–149)
VLDL Cholesterol Cal: 18 mg/dL (ref 5–40)

## 2021-05-29 LAB — TSH: TSH: 3.1 u[IU]/mL (ref 0.450–4.500)

## 2021-05-30 DIAGNOSIS — I5030 Unspecified diastolic (congestive) heart failure: Secondary | ICD-10-CM | POA: Diagnosis not present

## 2021-05-30 DIAGNOSIS — J4520 Mild intermittent asthma, uncomplicated: Secondary | ICD-10-CM | POA: Diagnosis not present

## 2021-05-30 DIAGNOSIS — I110 Hypertensive heart disease with heart failure: Secondary | ICD-10-CM | POA: Diagnosis not present

## 2021-05-30 DIAGNOSIS — E871 Hypo-osmolality and hyponatremia: Secondary | ICD-10-CM | POA: Diagnosis not present

## 2021-05-30 DIAGNOSIS — M1990 Unspecified osteoarthritis, unspecified site: Secondary | ICD-10-CM | POA: Diagnosis not present

## 2021-05-30 DIAGNOSIS — N3000 Acute cystitis without hematuria: Secondary | ICD-10-CM | POA: Diagnosis not present

## 2021-06-01 DIAGNOSIS — E871 Hypo-osmolality and hyponatremia: Secondary | ICD-10-CM | POA: Diagnosis not present

## 2021-06-01 DIAGNOSIS — I110 Hypertensive heart disease with heart failure: Secondary | ICD-10-CM | POA: Diagnosis not present

## 2021-06-01 DIAGNOSIS — M1990 Unspecified osteoarthritis, unspecified site: Secondary | ICD-10-CM | POA: Diagnosis not present

## 2021-06-01 DIAGNOSIS — I5030 Unspecified diastolic (congestive) heart failure: Secondary | ICD-10-CM | POA: Diagnosis not present

## 2021-06-01 DIAGNOSIS — N3000 Acute cystitis without hematuria: Secondary | ICD-10-CM | POA: Diagnosis not present

## 2021-06-01 DIAGNOSIS — J4520 Mild intermittent asthma, uncomplicated: Secondary | ICD-10-CM | POA: Diagnosis not present

## 2021-06-04 ENCOUNTER — Other Ambulatory Visit: Payer: Self-pay | Admitting: Cardiovascular Disease

## 2021-06-04 DIAGNOSIS — I4819 Other persistent atrial fibrillation: Secondary | ICD-10-CM

## 2021-06-05 ENCOUNTER — Ambulatory Visit: Payer: Medicare Other | Admitting: Internal Medicine

## 2021-06-05 ENCOUNTER — Encounter: Payer: Self-pay | Admitting: Internal Medicine

## 2021-06-05 ENCOUNTER — Ambulatory Visit (INDEPENDENT_AMBULATORY_CARE_PROVIDER_SITE_OTHER): Payer: Medicare Other | Admitting: Internal Medicine

## 2021-06-05 ENCOUNTER — Telehealth: Payer: Self-pay

## 2021-06-05 ENCOUNTER — Other Ambulatory Visit: Payer: Self-pay

## 2021-06-05 VITALS — BP 147/81 | HR 75 | Temp 98.5°F | Resp 16 | Ht 62.0 in | Wt 135.6 lb

## 2021-06-05 DIAGNOSIS — Z9981 Dependence on supplemental oxygen: Secondary | ICD-10-CM | POA: Diagnosis not present

## 2021-06-05 DIAGNOSIS — Z0001 Encounter for general adult medical examination with abnormal findings: Secondary | ICD-10-CM

## 2021-06-05 DIAGNOSIS — G8929 Other chronic pain: Secondary | ICD-10-CM

## 2021-06-05 DIAGNOSIS — R300 Dysuria: Secondary | ICD-10-CM

## 2021-06-05 DIAGNOSIS — M25512 Pain in left shoulder: Secondary | ICD-10-CM

## 2021-06-05 DIAGNOSIS — J4520 Mild intermittent asthma, uncomplicated: Secondary | ICD-10-CM | POA: Diagnosis not present

## 2021-06-05 DIAGNOSIS — I480 Paroxysmal atrial fibrillation: Secondary | ICD-10-CM

## 2021-06-05 NOTE — Telephone Encounter (Signed)
Home health plan of care signed by provider and faxed back to [facility] at [phone]. Held in home heath folder at the front desk.

## 2021-06-05 NOTE — Progress Notes (Signed)
[facility], [place] [address]  Internal MEDICINE  Office Visit Note  Patient Name: Ashley Weiss  [id number]  [id number]  Date of Service: 06/19/2021  Chief Complaint  Patient presents with   Medicare Wellness   Hyperlipidemia   Hypertension   Follow-up    Recheck for UTI from 05/16/21   pain in left shoulder    Pt fell on 04/08/21 EMS came and got her up.  04/10/21 pt went to [place]   Orders    Pt has 2 PT coming in home one for shoulder and one for strength and walking, balance     HPI Pt is here for routine health maintenance examination Uti was treated and is feeling better. Might need repeat urine and daughter is requesting a RX at hand for an antibiotic Left shoulder pain, getting PT but not improved, she will like to see orthopedic for intra-articular injection since this is affecting her sleep Has been having chest congestion, not using her albuterol or nebulizer treatment Daughter also brought her paperwork for DNR/DNI To be kept in the chart Current Medication: Outpatient Encounter Medications as of 06/05/2021  Medication Sig Note   albuterol (VENTOLIN HFA) 108 (90 Base) MCG/ACT inhaler Inhale 2 puffs into the lungs every 6 (six) hours as needed for wheezing or shortness of breath.    aspirin EC 81 MG tablet Take 1 tablet (81 mg total) by mouth daily.    b complex vitamins capsule Take 1 capsule by mouth daily.    Blood Glucose Monitoring Suppl (FREESTYLE FREEDOM LITE) w/Device KIT 1 kit by Does not apply route daily.    Calcium Citrate-Vitamin D (CITRACAL + D PO) Take by mouth.    cholecalciferol (VITAMIN D3) 25 MCG (1000 UNIT) tablet Take 1,000 Units by mouth daily.    denosumab (PROLIA) 60 MG/ML SOSY injection Inject 60 mg into the skin every 6 (six) months.    diltiazem (CARDIZEM CD) 120 MG 24 hr capsule TAKE 1 CAPSULE BY MOUTH EVERY DAY    Fluocinolone Acetonide 0.01 % OIL Place 4 drops in ear(s) at bedtime as  needed.     fluticasone (FLONASE) 50 MCG/ACT nasal spray SPRAY 1 SPRAY INTO BOTH NOSTRILS DAILY.    fluticasone (FLOVENT HFA) 110 MCG/ACT inhaler TAKE 2 PUFFS BY MOUTH TWICE A DAY    furosemide (LASIX) 40 MG tablet TAKE 1 TABLET DAILY AS NEEDED FOR WEIGHT GAIN, INCREASED SHORTNESS OF BREATH, OR SWELLING    glucose blood (FREESTYLE LITE) test strip Use as directed once a daily Diag E11.65    guaiFENesin (MUCINEX) 600 MG 12 hr tablet Take by mouth 2 (two) times daily. 01/18/2021: PRN   ipratropium-albuterol (DUONEB) 0.5-2.5 (3) MG/3ML SOLN USE 1 VIAL 3 TIMES DAILY AS NEEDED FOR SHORTNESS OF BREATH DX J44.9    Lancets (FREESTYLE) lancets Use as directed once daily diag e11.65    levothyroxine (SYNTHROID) 25 MCG tablet TAKE 1 DAILY BY MOUTH IN THE MORNING ON A EMPTY STOMACH    metoprolol tartrate (LOPRESSOR) 100 MG tablet TAKE 1 TABLET BY MOUTH TWICE A DAY    Multiple Vitamins-Minerals (MULTIVITAMIN WITH MINERALS) tablet Take 1 tablet by mouth daily.    oxyCODONE (OXY IR/ROXICODONE) 5 MG immediate release tablet Take 0.5-1 tablets (2.5-5 mg total) by mouth 3 (three) times daily as needed for severe pain or moderate pain.    oxycodone (OXY-IR) 5 MG capsule Take half to one tab as needed for pain tid    OXYGEN  Inhale 2 L into the lungs every evening. Sleep with nasal cannula w/ O2 at 2lpm.    potassium chloride (KLOR-CON M10) 10 MEQ tablet TAKE 1 TABLET BY MOUTH EVERY DAY AS NEEDED ON DAYS WHEN TAKING LASIX    Respiratory Therapy Supplies (ONE FLOW SPIROMETER) DEVI Please use device as tolerated and according to instruction provided.    rosuvastatin (CRESTOR) 5 MG tablet TAKE 1 TABLET (5 MG TOTAL) BY MOUTH DAILY AT 6 PM.    sertraline (ZOLOFT) 100 MG tablet TAKE 1 TABLET BY MOUTH EVERY DAY    [DISCONTINUED] metoprolol tartrate (LOPRESSOR) 25 MG tablet TAKE 1 TABLET (25 MG TOTAL) BY MOUTH DAILY AS NEEDED (FOR A FIB).    [DISCONTINUED] montelukast (SINGULAIR) 10 MG tablet TAKE 1 TABLET BY MOUTH EVERY DAY  FOR COUGH    No facility-administered encounter medications on file as of 06/05/2021.    Surgical History: Past Surgical History:  Procedure Laterality Date   BREAST CYST ASPIRATION Left 20+ yrs ago   CARDIOVERSION N/A 11/11/2018   Procedure: CARDIOVERSION;  Surgeon: Nelva Bush, MD;  Location: [facility] ORS;  Service: Cardiovascular;  Laterality: N/A;   CATARACT EXTRACTION Bilateral 2005,2009   cyst removal-right shoulder  1972   otosclerosis Left 1988   hardening of bone, other 1989 redone   THYROIDECTOMY  1970   nodule and 1/2 bridge removal   WRIST SURGERY Left 11/29/2009    Medical History: Past Medical History:  Diagnosis Date   Arthritis    Asthma    Basal cell carcinoma 08/02/2014   R check ant to earlobe    Cancer ([place])    skin   Depression    DVT (deep venous thrombosis) ([place])    Hyperlipidemia    Hypertension    Phlebitis    Squamous cell carcinoma of skin 10/19/2014   right cheek    Family History: Family History  Problem Relation Age of Onset   Osteoarthritis Mother    Depression Father    Atrial fibrillation Sister    Multiple myeloma Brother    Cancer Sister    Hodgkin's lymphoma Brother    Prostate cancer Brother    Breast cancer Neg Hx     Social History: Social History   Socioeconomic History   Marital status: Widowed    Spouse name: Not on file   Number of children: Not on file   Years of education: Not on file   Highest education level: Not on file  Occupational History   Not on file  Tobacco Use   Smoking status: Never   Smokeless tobacco: Never  Vaping Use   Vaping Use: Never used  Substance and Sexual Activity   Alcohol use: No   Drug use: No   Sexual activity: Never  Other Topics Concern   Not on file  Social History Narrative   Not on file   Social Determinants of Health   Financial Resource Strain: Not on file  Food Insecurity: Not on file  Transportation Needs: Not on file  Physical Activity: Not on file   Stress: Not on file  Social Connections: Not on file      Review of Systems  Constitutional:  Negative for chills, fatigue and unexpected weight change.  HENT:  Positive for postnasal drip. Negative for congestion, rhinorrhea, sneezing and sore throat.   Eyes:  Negative for redness.  Respiratory:  Negative for cough, chest tightness and shortness of breath.   Cardiovascular:  Negative for chest pain and palpitations.  Gastrointestinal:  Negative for abdominal pain, constipation, diarrhea, nausea and vomiting.  Genitourinary:  Positive for dysuria. Negative for frequency.  Musculoskeletal:  Negative for arthralgias, back pain, joint swelling and neck pain.  Skin:  Negative for rash.  Neurological: Negative.  Negative for tremors and numbness.  Hematological:  Negative for adenopathy. Does not bruise/bleed easily.  Psychiatric/Behavioral:  Negative for behavioral problems (Depression), sleep disturbance and suicidal ideas. The patient is not nervous/anxious.     Vital Signs: BP (!) 147/81    Pulse 75    Temp 98.5 F (36.9 C)    Resp 16    Ht _0  (1.575 m)    Wt 135 lb 9.6 oz (61.5 kg)    SpO2 95%    BMI 24.80 kg/m    Physical Exam Constitutional:      Appearance: Normal appearance.  HENT:     Head: Normocephalic and atraumatic.     Nose: Nose normal.     Mouth/Throat:     Mouth: Mucous membranes are moist.     Pharynx: No posterior oropharyngeal erythema.  Eyes:     Extraocular Movements: Extraocular movements intact.     Pupils: Pupils are equal, round, and reactive to light.  Cardiovascular:     Pulses: Normal pulses.     Heart sounds: Normal heart sounds.  Pulmonary:     Effort: Pulmonary effort is normal.     Breath sounds: Normal breath sounds.  Neurological:     General: No focal deficit present.     Mental Status: She is alert.  Psychiatric:        Mood and Affect: Mood normal.        Behavior: Behavior normal.     LABS: Recent Results (from the past  2160 hour(s))  CBC with Differential/Platelet     Status: Abnormal   Collection Time: 05/28/21 10:56 AM  Result Value Ref Range   WBC 10.1 3.4 - 10.8 x10E3/uL   RBC 3.94 3.77 - 5.28 x10E6/uL   Hemoglobin 12.7 11.1 - 15.9 g/dL   Hematocrit 37.8 34.0 - 46.6 %   MCV 96 79 - 97 fL   MCH 32.2 26.6 - 33.0 pg   MCHC 33.6 31.5 - 35.7 g/dL   RDW 12.6 11.7 - 15.4 %   Platelets 218 150 - 450 x10E3/uL   Neutrophils 65 Not Estab. %   Lymphs 19 Not Estab. %   Monocytes 10 Not Estab. %   Eos 5 Not Estab. %   Basos 1 Not Estab. %   Neutrophils Absolute 6.6 1.4 - 7.0 x10E3/uL   Lymphocytes Absolute 1.9 0.7 - 3.1 x10E3/uL   Monocytes Absolute 1.0 (H) 0.1 - 0.9 x10E3/uL   EOS (ABSOLUTE) 0.5 (H) 0.0 - 0.4 x10E3/uL   Basophils Absolute 0.1 0.0 - 0.2 x10E3/uL   Immature Granulocytes 0 Not Estab. %   Immature Grans (Abs) 0.0 0.0 - 0.1 x10E3/uL  Lipid Panel With LDL/HDL Ratio     Status: None   Collection Time: 05/28/21 10:56 AM  Result Value Ref Range   Cholesterol, Total 130 100 - 199 mg/dL   Triglycerides 97 0 - 149 mg/dL   HDL 51 >39 mg/dL   VLDL Cholesterol Cal 18 5 - 40 mg/dL   LDL Chol Calc (NIH) 61 0 - 99 mg/dL   LDL/HDL Ratio 1.2 0.0 - 3.2 ratio    Comment:  LDL/HDL Ratio                                             Men  Women                               1/2 Avg.Risk  1.0    1.5                                   Avg.Risk  3.6    3.2                                2X Avg.Risk  6.2    5.0                                3X Avg.Risk  8.0    6.1   TSH     Status: None   Collection Time: 05/28/21 10:56 AM  Result Value Ref Range   TSH 3.100 0.450 - 4.500 uIU/mL  T4, free     Status: None   Collection Time: 05/28/21 10:56 AM  Result Value Ref Range   Free T4 1.14 0.82 - 1.77 ng/dL  Comprehensive metabolic panel     Status: Abnormal   Collection Time: 05/28/21 10:56 AM  Result Value Ref Range   Glucose 129 (H) 70 - 99 mg/dL   BUN 13 10 - 36 mg/dL    Creatinine, Ser 0.56 (L) 0.57 - 1.00 mg/dL   eGFR 82 >59 mL/min/1.73   BUN/Creatinine Ratio 23 12 - 28   Sodium 139 134 - 144 mmol/L   Potassium 4.9 3.5 - 5.2 mmol/L   Chloride 94 (L) 96 - 106 mmol/L   CO2 30 (H) 20 - 29 mmol/L   Calcium 9.4 8.7 - 10.3 mg/dL   Total Protein 7.7 6.0 - 8.5 g/dL   Albumin 3.9 3.5 - 4.6 g/dL   Globulin, Total 3.8 1.5 - 4.5 g/dL   Albumin/Globulin Ratio 1.0 (L) 1.2 - 2.2   Bilirubin Total 0.4 0.0 - 1.2 mg/dL   Alkaline Phosphatase 87 44 - 121 IU/L   AST 18 0 - 40 IU/L   ALT 11 0 - 32 IU/L  UA/M w/rflx Culture, Routine     Status: Abnormal   Collection Time: 06/05/21  3:35 PM   Specimen: Urine   Urine  Result Value Ref Range   Specific Gravity, UA 1.021 1.005 - 1.030   pH, UA 6.0 5.0 - 7.5   Color, UA Yellow Yellow   Appearance Ur Clear Clear   Leukocytes,UA Trace (A) Negative   Protein,UA Trace Negative/Trace   Glucose, UA Negative Negative   Ketones, UA Trace (A) Negative   RBC, UA Negative Negative   Bilirubin, UA Negative Negative   Urobilinogen, Ur 0.2 0.2 - 1.0 mg/dL   Nitrite, UA Negative Negative   Microscopic Examination See below:     Comment: Microscopic was indicated and was performed.   Urinalysis Reflex Comment     Comment: This specimen has reflexed to a Urine Culture.  Microscopic Examination     Status: Abnormal   Collection Time: 06/05/21  3:35 PM   Urine  Result Value Ref Range   WBC, UA 6-10 (A) 0 - 5 /hpf   RBC None seen 0 - 2 /hpf   Epithelial Cells (non renal) 0-10 0 - 10 /hpf   Casts None seen None seen /lpf   Crystals Present (A) N/A   Crystal Type Calcium Oxalate N/A   Bacteria, UA None seen None seen/Few  Urine Culture, Reflex     Status: None   Collection Time: 06/05/21  3:35 PM   Urine  Result Value Ref Range   Urine Culture, Routine Final report    Organism ID, Bacteria Comment     Comment: Mixed urogenital flora 25,000-50,000 colony forming units per mL     Assessment/Plan: 1. Encounter for general  adult medical examination with abnormal findings Patient is living with her daughter, she is well-kept and taking care of by her daughter to mental health maintenance is up-to-date  2. Chronic asthma, mild intermittent, uncomplicated Patient is to continue her Flovent however she does need to use her DuoNeb a little bit more regularly so I instructed them to start taking it twice a day  3. Paroxysmal atrial fibrillation (HCC) Rate is controlled and is stable followed by cardiology  4. Oxygen dependent Continue oxygen and followed by pulmonary  5. Chronic left shoulder pain No improvement is seen with PT and is getting worse range of motion is reduced we will send her to orthopedic - Ambulatory referral to Orthopedic Surgery  6. Dysuria - UA/M w/rflx Culture, Routine - Microscopic Examination - Urine Culture, Reflex   General Counseling: Camron verbalizes understanding of the findings of todays visit and agrees with plan of treatment. I have discussed any further diagnostic evaluation that may be needed or ordered today. We also reviewed her medications today. she has been encouraged to call the office with any questions or concerns that should arise related to todays visit.    Counseling:  [place] Controlled Substance Database was reviewed by me.  Orders Placed This Encounter  Procedures   Microscopic Examination   Urine Culture, Reflex   UA/M w/rflx Culture, Routine   Ambulatory referral to Orthopedic Surgery    No orders of the defined types were placed in this encounter.   Total time spent:45 Minutes  Time spent includes review of chart, medications, test results, and follow up plan with the patient.     Lavera Guise, MD  Internal Medicine

## 2021-06-05 NOTE — Telephone Encounter (Signed)
Awaiting 06/06/11 office notes for Ortho surgery referral-Toni

## 2021-06-06 ENCOUNTER — Other Ambulatory Visit: Payer: Self-pay | Admitting: Cardiovascular Disease

## 2021-06-06 DIAGNOSIS — I4819 Other persistent atrial fibrillation: Secondary | ICD-10-CM

## 2021-06-08 DIAGNOSIS — J4520 Mild intermittent asthma, uncomplicated: Secondary | ICD-10-CM | POA: Diagnosis not present

## 2021-06-08 DIAGNOSIS — E871 Hypo-osmolality and hyponatremia: Secondary | ICD-10-CM | POA: Diagnosis not present

## 2021-06-08 DIAGNOSIS — I5030 Unspecified diastolic (congestive) heart failure: Secondary | ICD-10-CM | POA: Diagnosis not present

## 2021-06-08 DIAGNOSIS — I110 Hypertensive heart disease with heart failure: Secondary | ICD-10-CM | POA: Diagnosis not present

## 2021-06-08 DIAGNOSIS — N3000 Acute cystitis without hematuria: Secondary | ICD-10-CM | POA: Diagnosis not present

## 2021-06-08 DIAGNOSIS — M1990 Unspecified osteoarthritis, unspecified site: Secondary | ICD-10-CM | POA: Diagnosis not present

## 2021-06-08 LAB — UA/M W/RFLX CULTURE, ROUTINE
Bilirubin, UA: NEGATIVE
Glucose, UA: NEGATIVE
Nitrite, UA: NEGATIVE
RBC, UA: NEGATIVE
Specific Gravity, UA: 1.021 (ref 1.005–1.030)
Urobilinogen, Ur: 0.2 mg/dL (ref 0.2–1.0)
pH, UA: 6 (ref 5.0–7.5)

## 2021-06-08 LAB — MICROSCOPIC EXAMINATION
Bacteria, UA: NONE SEEN
Casts: NONE SEEN /lpf
RBC, Urine: NONE SEEN /hpf (ref 0–2)

## 2021-06-08 LAB — URINE CULTURE, REFLEX

## 2021-06-10 DIAGNOSIS — Z85828 Personal history of other malignant neoplasm of skin: Secondary | ICD-10-CM | POA: Diagnosis not present

## 2021-06-10 DIAGNOSIS — I110 Hypertensive heart disease with heart failure: Secondary | ICD-10-CM | POA: Diagnosis not present

## 2021-06-10 DIAGNOSIS — Z7982 Long term (current) use of aspirin: Secondary | ICD-10-CM | POA: Diagnosis not present

## 2021-06-10 DIAGNOSIS — M1990 Unspecified osteoarthritis, unspecified site: Secondary | ICD-10-CM | POA: Diagnosis not present

## 2021-06-10 DIAGNOSIS — I480 Paroxysmal atrial fibrillation: Secondary | ICD-10-CM | POA: Diagnosis not present

## 2021-06-10 DIAGNOSIS — J4520 Mild intermittent asthma, uncomplicated: Secondary | ICD-10-CM | POA: Diagnosis not present

## 2021-06-10 DIAGNOSIS — N3000 Acute cystitis without hematuria: Secondary | ICD-10-CM | POA: Diagnosis not present

## 2021-06-10 DIAGNOSIS — F32A Depression, unspecified: Secondary | ICD-10-CM | POA: Diagnosis not present

## 2021-06-10 DIAGNOSIS — E785 Hyperlipidemia, unspecified: Secondary | ICD-10-CM | POA: Diagnosis not present

## 2021-06-10 DIAGNOSIS — Z7951 Long term (current) use of inhaled steroids: Secondary | ICD-10-CM | POA: Diagnosis not present

## 2021-06-10 DIAGNOSIS — Z9981 Dependence on supplemental oxygen: Secondary | ICD-10-CM | POA: Diagnosis not present

## 2021-06-10 DIAGNOSIS — E871 Hypo-osmolality and hyponatremia: Secondary | ICD-10-CM | POA: Diagnosis not present

## 2021-06-10 DIAGNOSIS — I5030 Unspecified diastolic (congestive) heart failure: Secondary | ICD-10-CM | POA: Diagnosis not present

## 2021-06-11 DIAGNOSIS — E871 Hypo-osmolality and hyponatremia: Secondary | ICD-10-CM | POA: Diagnosis not present

## 2021-06-11 DIAGNOSIS — I110 Hypertensive heart disease with heart failure: Secondary | ICD-10-CM | POA: Diagnosis not present

## 2021-06-11 DIAGNOSIS — M1990 Unspecified osteoarthritis, unspecified site: Secondary | ICD-10-CM | POA: Diagnosis not present

## 2021-06-11 DIAGNOSIS — N3000 Acute cystitis without hematuria: Secondary | ICD-10-CM | POA: Diagnosis not present

## 2021-06-11 DIAGNOSIS — J4520 Mild intermittent asthma, uncomplicated: Secondary | ICD-10-CM | POA: Diagnosis not present

## 2021-06-11 DIAGNOSIS — I5030 Unspecified diastolic (congestive) heart failure: Secondary | ICD-10-CM | POA: Diagnosis not present

## 2021-06-12 ENCOUNTER — Ambulatory Visit: Payer: Medicare Other | Admitting: Internal Medicine

## 2021-06-12 DIAGNOSIS — E871 Hypo-osmolality and hyponatremia: Secondary | ICD-10-CM | POA: Diagnosis not present

## 2021-06-12 DIAGNOSIS — J4520 Mild intermittent asthma, uncomplicated: Secondary | ICD-10-CM | POA: Diagnosis not present

## 2021-06-12 DIAGNOSIS — I5030 Unspecified diastolic (congestive) heart failure: Secondary | ICD-10-CM | POA: Diagnosis not present

## 2021-06-12 DIAGNOSIS — N3000 Acute cystitis without hematuria: Secondary | ICD-10-CM | POA: Diagnosis not present

## 2021-06-12 DIAGNOSIS — M1990 Unspecified osteoarthritis, unspecified site: Secondary | ICD-10-CM | POA: Diagnosis not present

## 2021-06-12 DIAGNOSIS — I110 Hypertensive heart disease with heart failure: Secondary | ICD-10-CM | POA: Diagnosis not present

## 2021-06-13 ENCOUNTER — Telehealth: Payer: Medicare Other

## 2021-06-13 DIAGNOSIS — M1990 Unspecified osteoarthritis, unspecified site: Secondary | ICD-10-CM | POA: Diagnosis not present

## 2021-06-13 DIAGNOSIS — E871 Hypo-osmolality and hyponatremia: Secondary | ICD-10-CM | POA: Diagnosis not present

## 2021-06-13 DIAGNOSIS — I5030 Unspecified diastolic (congestive) heart failure: Secondary | ICD-10-CM | POA: Diagnosis not present

## 2021-06-13 DIAGNOSIS — J4520 Mild intermittent asthma, uncomplicated: Secondary | ICD-10-CM | POA: Diagnosis not present

## 2021-06-13 DIAGNOSIS — N3000 Acute cystitis without hematuria: Secondary | ICD-10-CM | POA: Diagnosis not present

## 2021-06-13 DIAGNOSIS — I110 Hypertensive heart disease with heart failure: Secondary | ICD-10-CM | POA: Diagnosis not present

## 2021-06-19 ENCOUNTER — Other Ambulatory Visit: Payer: Self-pay | Admitting: Internal Medicine

## 2021-06-20 DIAGNOSIS — H6123 Impacted cerumen, bilateral: Secondary | ICD-10-CM | POA: Diagnosis not present

## 2021-06-20 DIAGNOSIS — H903 Sensorineural hearing loss, bilateral: Secondary | ICD-10-CM | POA: Diagnosis not present

## 2021-06-20 DIAGNOSIS — H6063 Unspecified chronic otitis externa, bilateral: Secondary | ICD-10-CM | POA: Diagnosis not present

## 2021-06-20 NOTE — Telephone Encounter (Signed)
Manually faxed to [facility] per daughter's request. (P): [phone], (f): [phone] ?

## 2021-06-21 ENCOUNTER — Other Ambulatory Visit: Payer: Self-pay

## 2021-06-21 ENCOUNTER — Other Ambulatory Visit: Payer: Self-pay | Admitting: Cardiovascular Disease

## 2021-06-21 ENCOUNTER — Encounter: Payer: Self-pay | Admitting: Cardiovascular Disease

## 2021-06-21 ENCOUNTER — Telehealth: Payer: Self-pay

## 2021-06-21 DIAGNOSIS — I4819 Other persistent atrial fibrillation: Secondary | ICD-10-CM

## 2021-06-21 MED ORDER — AZITHROMYCIN 250 MG PO TABS: ORAL_TABLET | ORAL | 0 refills | Status: DC

## 2021-06-21 MED ORDER — PREDNISONE 10 MG PO TABS
ORAL_TABLET | ORAL | 0 refills | Status: DC
Start: 2021-06-21 — End: 2021-07-18

## 2021-06-21 NOTE — Telephone Encounter (Signed)
Pt daughter called that her mother that she is having sinuitis,congestion,clear mucus ,no fever as per dr Humphrey Rolls send zpak and prednisone also confirmed that her mom ok its ok to take zpak she had 2021 had no problem  ?

## 2021-06-25 ENCOUNTER — Other Ambulatory Visit: Payer: Self-pay | Admitting: Internal Medicine

## 2021-06-26 ENCOUNTER — Telehealth: Payer: Self-pay

## 2021-06-26 ENCOUNTER — Other Ambulatory Visit: Payer: Self-pay | Admitting: Cardiovascular Disease

## 2021-06-26 DIAGNOSIS — I4819 Other persistent atrial fibrillation: Secondary | ICD-10-CM

## 2021-06-26 MED ORDER — METOPROLOL TARTRATE 25 MG PO TABS: 25.0000 mg | ORAL_TABLET | Freq: Every day | ORAL | 0 refills | Status: DC | PRN

## 2021-06-26 NOTE — Addendum Note (Signed)
Addended by: Lamar Laundry on: 06/26/2021 02:09 PM ? ? Modules accepted: Orders ? ?

## 2021-06-26 NOTE — Addendum Note (Signed)
Addended by: Britt Bottom on: 06/26/2021 11:37 AM ? ? Modules accepted: Orders ? ?

## 2021-06-26 NOTE — Telephone Encounter (Signed)
Order for OT signed by provider and faxed back to [facility] at [phone]. Order placed in home health folder at the front. ?

## 2021-06-26 NOTE — Telephone Encounter (Addendum)
Dr. Fletcher Anon patient. ?The MyChart message I sent while in triage has not been read.  ? ?Lattie Haw are you familiar with this patient?  ?

## 2021-06-27 DIAGNOSIS — I5030 Unspecified diastolic (congestive) heart failure: Secondary | ICD-10-CM | POA: Diagnosis not present

## 2021-06-27 DIAGNOSIS — M1990 Unspecified osteoarthritis, unspecified site: Secondary | ICD-10-CM | POA: Diagnosis not present

## 2021-06-27 DIAGNOSIS — I110 Hypertensive heart disease with heart failure: Secondary | ICD-10-CM | POA: Diagnosis not present

## 2021-06-27 DIAGNOSIS — N3000 Acute cystitis without hematuria: Secondary | ICD-10-CM | POA: Diagnosis not present

## 2021-06-27 DIAGNOSIS — J4520 Mild intermittent asthma, uncomplicated: Secondary | ICD-10-CM | POA: Diagnosis not present

## 2021-06-27 DIAGNOSIS — E871 Hypo-osmolality and hyponatremia: Secondary | ICD-10-CM | POA: Diagnosis not present

## 2021-06-29 ENCOUNTER — Telehealth: Payer: Self-pay

## 2021-06-29 NOTE — Telephone Encounter (Signed)
Order for Albuterol treatment signed by provider and faxed back to [facility] at [phone]. Held in home health folder at front. ?

## 2021-07-03 DIAGNOSIS — J4520 Mild intermittent asthma, uncomplicated: Secondary | ICD-10-CM | POA: Diagnosis not present

## 2021-07-03 DIAGNOSIS — E871 Hypo-osmolality and hyponatremia: Secondary | ICD-10-CM | POA: Diagnosis not present

## 2021-07-03 DIAGNOSIS — I5030 Unspecified diastolic (congestive) heart failure: Secondary | ICD-10-CM | POA: Diagnosis not present

## 2021-07-03 DIAGNOSIS — N3000 Acute cystitis without hematuria: Secondary | ICD-10-CM | POA: Diagnosis not present

## 2021-07-03 DIAGNOSIS — M1990 Unspecified osteoarthritis, unspecified site: Secondary | ICD-10-CM | POA: Diagnosis not present

## 2021-07-03 DIAGNOSIS — I110 Hypertensive heart disease with heart failure: Secondary | ICD-10-CM | POA: Diagnosis not present

## 2021-07-06 DIAGNOSIS — J4520 Mild intermittent asthma, uncomplicated: Secondary | ICD-10-CM | POA: Diagnosis not present

## 2021-07-06 DIAGNOSIS — E871 Hypo-osmolality and hyponatremia: Secondary | ICD-10-CM | POA: Diagnosis not present

## 2021-07-06 DIAGNOSIS — N3000 Acute cystitis without hematuria: Secondary | ICD-10-CM | POA: Diagnosis not present

## 2021-07-06 DIAGNOSIS — I110 Hypertensive heart disease with heart failure: Secondary | ICD-10-CM | POA: Diagnosis not present

## 2021-07-06 DIAGNOSIS — I5030 Unspecified diastolic (congestive) heart failure: Secondary | ICD-10-CM | POA: Diagnosis not present

## 2021-07-06 DIAGNOSIS — M1990 Unspecified osteoarthritis, unspecified site: Secondary | ICD-10-CM | POA: Diagnosis not present

## 2021-07-09 DIAGNOSIS — H353131 Nonexudative age-related macular degeneration, bilateral, early dry stage: Secondary | ICD-10-CM | POA: Diagnosis not present

## 2021-07-09 DIAGNOSIS — H26493 Other secondary cataract, bilateral: Secondary | ICD-10-CM | POA: Diagnosis not present

## 2021-07-09 DIAGNOSIS — Z961 Presence of intraocular lens: Secondary | ICD-10-CM | POA: Diagnosis not present

## 2021-07-09 DIAGNOSIS — H04123 Dry eye syndrome of bilateral lacrimal glands: Secondary | ICD-10-CM | POA: Diagnosis not present

## 2021-07-10 DIAGNOSIS — Z20822 Contact with and (suspected) exposure to covid-19: Secondary | ICD-10-CM | POA: Diagnosis not present

## 2021-07-18 ENCOUNTER — Other Ambulatory Visit: Payer: Self-pay

## 2021-07-18 ENCOUNTER — Ambulatory Visit (INDEPENDENT_AMBULATORY_CARE_PROVIDER_SITE_OTHER): Payer: Medicare Other | Admitting: Nurse Practitioner

## 2021-07-18 ENCOUNTER — Encounter: Payer: Self-pay | Admitting: Nurse Practitioner

## 2021-07-18 VITALS — BP 140/66 | HR 80 | Ht 62.0 in | Wt 134.2 lb

## 2021-07-18 DIAGNOSIS — I480 Paroxysmal atrial fibrillation: Secondary | ICD-10-CM | POA: Diagnosis not present

## 2021-07-18 DIAGNOSIS — I10 Essential (primary) hypertension: Secondary | ICD-10-CM

## 2021-07-18 DIAGNOSIS — I5032 Chronic diastolic (congestive) heart failure: Secondary | ICD-10-CM

## 2021-07-18 DIAGNOSIS — E782 Mixed hyperlipidemia: Secondary | ICD-10-CM | POA: Diagnosis not present

## 2021-07-18 NOTE — Patient Instructions (Signed)
Medication Instructions:  ?No changes at this time.  ? ?*If you need a refill on your cardiac medications before your next appointment, please call your pharmacy* ? ? ?Lab Work: ?None ? ?If you have labs (blood work) drawn today and your tests are completely normal, you will receive your results only by: ?MyChart Message (if you have MyChart) OR ?A paper copy in the mail ?If you have any lab test that is abnormal or we need to change your treatment, we will call you to review the results. ? ? ?Testing/Procedures: ?None ? ? ?Follow-Up: ?At [facility], you and your health needs are our priority.  As part of our continuing mission to provide you with exceptional heart care, we have created designated Provider Care Teams.  These Care Teams include your primary Cardiologist (physician) and Advanced Practice Providers (APPs -  Physician Assistants and Nurse Practitioners) who all work together to provide you with the care you need, when you need it. ? ? ?Your next appointment:   ?6 month(s) ? ?The format for your next appointment:   ?In Person ? ?Provider:   ?Kathlyn Sacramento, MD or Murray Hodgkins, NP ?

## 2021-07-18 NOTE — Progress Notes (Signed)
? ? ?Office Visit  ?  ?Patient Name: Ashley Weiss ?Date of Encounter: 07/18/2021 ? ?Primary Care Provider:  Lavera Guise, MD ?Primary Cardiologist:  Kathlyn Sacramento, MD ? ?Chief Complaint  ?  ?88 year old female with a PMH of HLD and essential HTN presents for a follow-up of her paroxysmal atrial fibrillation and chronic HFpEF.  ? ?Past Medical History  ?  ?Past Medical History:  ?Diagnosis Date  ? Arthritis   ? Asthma   ? Basal cell carcinoma 08/02/2014  ? R check ant to earlobe   ? Cancer [facility])   ? skin  ? Chronic heart failure with preserved ejection fraction (HFpEF) ([place])   ? a. 10/2018 Echo: EF 55-60%, restrictive filling. Mildly reduced RV fxn. RVSP 58.69mmHg. Mod dil LA, mildly dil RA. Mod MR/TR. Ao sclerosis w/o stenosis, triv AI.  ? Depression   ? DVT (deep venous thrombosis) ([place])   ? Frequent falls   ? Hyperlipidemia   ? Hypertension   ? Persistent atrial fibrillation ([place])   ? a. 10/2018 Amio load followed by DCCV; b. 10/2019 Amio d/c'd ->pulm toxicity; c. CHA2DS2VASc = 6-->prev on [place] but d/c'd 2/2 falls.  ASA only.  ? Phlebitis   ? Squamous cell carcinoma of skin 10/19/2014  ? right cheek  ? ?Past Surgical History:  ?Procedure Laterality Date  ? BREAST CYST ASPIRATION Left 20+ yrs ago  ? CARDIOVERSION N/A 11/11/2018  ? Procedure: CARDIOVERSION;  Surgeon: Nelva Bush, MD;  Location: [facility] ORS;  Service: Cardiovascular;  Laterality: N/A;  ? CATARACT EXTRACTION Bilateral 2005,2009  ? cyst removal-right shoulder  1972  ? otosclerosis Left 1988  ? hardening of bone, other 1989 redone  ? THYROIDECTOMY  1970  ? nodule and 1/2 bridge removal  ? WRIST SURGERY Left 11/29/2009  ? ?Allergies ? ?Allergies  ?Allergen Reactions  ? Hydralazine Itching and Swelling  ? Biaxin [Clarithromycin] Nausea And Vomiting  ? Ciprofloxacin Other (See Comments)  ?  Hand cramping ?  ? Meloxicam Other (See Comments)  ?  Unknown  ? Minocycline Other (See Comments)  ?  Unknown  ? ? ?History of Present Illness  ?  ?88 year old female  with a PMH of HLD and essential HTN presents for a follow-up of her persistent atrial fibrillation and chronic HFpEF.   Afib was previously very difficult to control, and in July 2020, she was placed on amiodarone and underwent cardioversion, which was successful. Most recent echocardiogram in July 2020 showed an EF of 55 to 60% with a moderately dilated left atrium, moderate pulmonary HTN, and moderate tricuspid regurgitation. Amiodarone was stopped in January 2021 due to pulmonary concerns of toxicity requiring oxygen therapy. Chest-xray showed ILD. PFTs showed severe restrictive lung disease with severely decreased DLCO. Anticoagulation was also stopped due to recurrent falls.  The patient was last seen in cardiology clinic six months ago on 01/18/2021 by Dr. Fletcher Anon. At that time she was doing well from a cardiac standpoint with no chest pain or worsening dyspnea, no palpitations, and no tachycardia. She was maintaining sinus rhythm and continued on diltiazem and metoprolol for her atrial fibrillation.  ? ?Since patient was last seen in clinic, she has been feeling well overall. Continues to live with her daughter. Continues to be independent in dressing and toileting though uses a wheelchair to get around. Also has a home health companion come twice a week to give her a bath and assist with meals. Is eating well, 3 homecooked meals a day which often  include chicken or fish. Hydrating with water appropriately. She was seen in the emergency department in November for a fall, and again in January for mental status changes associated with a UTI which has since resolved. Patient reports no recent falls since November.  She occasionally notes palpitations, usually @ night and brief, w/ nl heart rates when checked w/ pulse oximeter.  Also notes occas, fleeting c/p, once a month or less, lasting a few secs, and resolving spontaneously.  She isn't sure how long she has been experiencing these types of symptoms but doesn't  think it's new.  She denies pnd, orthopnea, n, v, dizziness, syncope, edema, weight gain, or early satiety. ? ?Home Medications  ?  ?Current Outpatient Medications  ?Medication Sig Dispense Refill  ? albuterol (VENTOLIN HFA) 108 (90 Base) MCG/ACT inhaler Inhale 2 puffs into the lungs every 6 (six) hours as needed for wheezing or shortness of breath. 18 g 3  ? aspirin EC 81 MG tablet Take 1 tablet (81 mg total) by mouth daily.    ? b complex vitamins capsule Take 1 capsule by mouth daily.    ? Blood Glucose Monitoring Suppl (FREESTYLE FREEDOM LITE) w/Device KIT 1 kit by Does not apply route daily. 1 each 0  ? Calcium Citrate-Vitamin D (CITRACAL + D PO) Take by mouth.    ? cholecalciferol (VITAMIN D3) 25 MCG (1000 UNIT) tablet Take 1,000 Units by mouth daily.    ? denosumab (PROLIA) 60 MG/ML SOSY injection Inject 60 mg into the skin every 6 (six) months.    ? diltiazem (CARDIZEM CD) 120 MG 24 hr capsule TAKE 1 CAPSULE BY MOUTH EVERY DAY 90 capsule 1  ? Fluocinolone Acetonide 0.01 % OIL Place 4 drops in ear(s) at bedtime as needed.     ? fluticasone (FLONASE) 50 MCG/ACT nasal spray SPRAY 1 SPRAY INTO BOTH NOSTRILS DAILY. 48 mL 3  ? fluticasone (FLOVENT HFA) 110 MCG/ACT inhaler TAKE 2 PUFFS BY MOUTH TWICE A DAY 3 each 3  ? furosemide (LASIX) 40 MG tablet TAKE 1 TABLET DAILY AS NEEDED FOR WEIGHT GAIN, INCREASED SHORTNESS OF BREATH, OR SWELLING 90 tablet 0  ? glucose blood (FREESTYLE LITE) test strip Use as directed once a daily Diag E11.65 100 each 1  ? guaiFENesin (MUCINEX) 600 MG 12 hr tablet Take by mouth 2 (two) times daily.    ? ipratropium-albuterol (DUONEB) 0.5-2.5 (3) MG/3ML SOLN USE 1 VIAL 3 TIMES DAILY AS NEEDED FOR SHORTNESS OF BREATH DX J44.9 810 mL 1  ? Lancets (FREESTYLE) lancets Use as directed once daily diag e11.65 100 each 12  ? levothyroxine (SYNTHROID) 25 MCG tablet TAKE 1 DAILY BY MOUTH IN THE MORNING ON A EMPTY STOMACH 90 tablet 1  ? metoprolol tartrate (LOPRESSOR) 100 MG tablet TAKE 1 TABLET BY  MOUTH TWICE A DAY 180 tablet 3  ? metoprolol tartrate (LOPRESSOR) 25 MG tablet Take 1 tablet (25 mg total) by mouth daily as needed (for a fib). 30 tablet 0  ? montelukast (SINGULAIR) 10 MG tablet TAKE 1 TABLET BY MOUTH EVERY DAY FOR COUGH 90 tablet 3  ? Multiple Vitamins-Minerals (MULTIVITAMIN WITH MINERALS) tablet Take 1 tablet by mouth daily.    ? oxyCODONE (OXY IR/ROXICODONE) 5 MG immediate release tablet Take 0.5-1 tablets (2.5-5 mg total) by mouth 3 (three) times daily as needed for severe pain or moderate pain. 15 tablet 0  ? oxycodone (OXY-IR) 5 MG capsule Take half to one tab as needed for pain tid 15 capsule 0  ?  OXYGEN Inhale 2 L into the lungs every evening. Sleep with nasal cannula w/ O2 at 2lpm.    ? potassium chloride (KLOR-CON M10) 10 MEQ tablet TAKE 1 TABLET BY MOUTH EVERY DAY AS NEEDED ON DAYS WHEN TAKING LASIX 90 tablet 0  ? Respiratory Therapy Supplies (ONE FLOW SPIROMETER) DEVI Please use device as tolerated and according to instruction provided. 1 Device 0  ? rosuvastatin (CRESTOR) 5 MG tablet TAKE 1 TABLET (5 MG TOTAL) BY MOUTH DAILY AT 6 PM. 90 tablet 1  ? sertraline (ZOLOFT) 100 MG tablet TAKE 1 TABLET BY MOUTH EVERY DAY 90 tablet 2  ? ?No current facility-administered medications for this visit.  ?  ? ?Review of Systems  ?  ?+++ occasional palpitations which wake her up at night and occur less than once a week and are not associated with other symptoms.  ?+++ rare, fleeting chest pain which occurs less than once a month and is not associated with other symptoms or with episodes of palpitations.  ?+++ ongoing dizziness when she stands up.  ?She denies pnd, orthopnea, n, v, syncope, edema, weight gain, or early satiety.  ?All other systems reviewed and are otherwise negative except as noted above. ? ?Physical Exam  ?  ?VS:  BP 140/66 (BP Location: Right Arm, Patient Position: Sitting, Cuff Size: Normal)   Pulse 80   Ht 5' 2"  (1.575 m)   Wt 134 lb 4 oz (60.9 kg)   SpO2 95%   BMI 24.55  kg/m?  , BMI Body mass index is 24.55 kg/m?. ?    ?GEN: Well nourished, well developed, in no acute distress. ?HEENT: normal. ?Neck: Supple, no JVD, carotid bruits, or masses. ?Cardiac: RRR, no murmurs, rubs, or

## 2021-07-20 ENCOUNTER — Other Ambulatory Visit: Payer: Self-pay | Admitting: Cardiovascular Disease

## 2021-07-20 DIAGNOSIS — I4819 Other persistent atrial fibrillation: Secondary | ICD-10-CM

## 2021-08-02 DIAGNOSIS — H6063 Unspecified chronic otitis externa, bilateral: Secondary | ICD-10-CM | POA: Diagnosis not present

## 2021-08-02 DIAGNOSIS — H6123 Impacted cerumen, bilateral: Secondary | ICD-10-CM | POA: Diagnosis not present

## 2021-08-02 DIAGNOSIS — H903 Sensorineural hearing loss, bilateral: Secondary | ICD-10-CM | POA: Diagnosis not present

## 2021-08-03 ENCOUNTER — Other Ambulatory Visit: Payer: Self-pay | Admitting: Cardiovascular Disease

## 2021-08-03 DIAGNOSIS — I4819 Other persistent atrial fibrillation: Secondary | ICD-10-CM

## 2021-08-06 DIAGNOSIS — Z20822 Contact with and (suspected) exposure to covid-19: Secondary | ICD-10-CM | POA: Diagnosis not present

## 2021-08-08 ENCOUNTER — Telehealth: Payer: Medicare Other

## 2021-08-10 DIAGNOSIS — M810 Age-related osteoporosis without current pathological fracture: Secondary | ICD-10-CM | POA: Diagnosis not present

## 2021-08-14 DIAGNOSIS — R051 Acute cough: Secondary | ICD-10-CM | POA: Diagnosis not present

## 2021-08-14 DIAGNOSIS — R059 Cough, unspecified: Secondary | ICD-10-CM | POA: Diagnosis not present

## 2021-08-14 DIAGNOSIS — Z20822 Contact with and (suspected) exposure to covid-19: Secondary | ICD-10-CM | POA: Diagnosis not present

## 2021-08-27 DIAGNOSIS — Z20822 Contact with and (suspected) exposure to covid-19: Secondary | ICD-10-CM | POA: Diagnosis not present

## 2021-08-29 ENCOUNTER — Other Ambulatory Visit: Payer: Self-pay | Admitting: Internal Medicine

## 2021-09-05 ENCOUNTER — Telehealth: Payer: Self-pay

## 2021-09-05 NOTE — Telephone Encounter (Signed)
Home health and plan of care signed by Dr.Fozia Humphrey Rolls and faxed back to [facility] at [phone]. Held at home health folder at the front desk. ?

## 2021-09-15 ENCOUNTER — Other Ambulatory Visit: Payer: Self-pay | Admitting: Cardiovascular Disease

## 2021-09-27 DIAGNOSIS — H6123 Impacted cerumen, bilateral: Secondary | ICD-10-CM | POA: Diagnosis not present

## 2021-09-27 DIAGNOSIS — H903 Sensorineural hearing loss, bilateral: Secondary | ICD-10-CM | POA: Diagnosis not present

## 2021-09-27 DIAGNOSIS — H6063 Unspecified chronic otitis externa, bilateral: Secondary | ICD-10-CM | POA: Diagnosis not present

## 2021-09-28 ENCOUNTER — Other Ambulatory Visit: Payer: Self-pay | Admitting: Cardiovascular Disease

## 2021-11-04 ENCOUNTER — Other Ambulatory Visit: Payer: Self-pay | Admitting: Internal Medicine

## 2021-11-04 ENCOUNTER — Other Ambulatory Visit: Payer: Self-pay | Admitting: Physician Assistant

## 2021-11-04 ENCOUNTER — Other Ambulatory Visit: Payer: Self-pay | Admitting: Cardiovascular Disease

## 2021-11-04 DIAGNOSIS — J4520 Mild intermittent asthma, uncomplicated: Secondary | ICD-10-CM

## 2021-11-05 ENCOUNTER — Other Ambulatory Visit: Payer: Self-pay | Admitting: Cardiovascular Disease

## 2021-11-09 DIAGNOSIS — H6063 Unspecified chronic otitis externa, bilateral: Secondary | ICD-10-CM | POA: Diagnosis not present

## 2021-11-09 DIAGNOSIS — H903 Sensorineural hearing loss, bilateral: Secondary | ICD-10-CM | POA: Diagnosis not present

## 2021-11-09 DIAGNOSIS — H9212 Otorrhea, left ear: Secondary | ICD-10-CM | POA: Diagnosis not present

## 2021-11-09 DIAGNOSIS — H6123 Impacted cerumen, bilateral: Secondary | ICD-10-CM | POA: Diagnosis not present

## 2021-11-19 ENCOUNTER — Other Ambulatory Visit: Payer: Self-pay | Admitting: Internal Medicine

## 2021-11-19 DIAGNOSIS — J4520 Mild intermittent asthma, uncomplicated: Secondary | ICD-10-CM

## 2021-11-27 ENCOUNTER — Other Ambulatory Visit: Payer: Self-pay | Admitting: Cardiovascular Disease

## 2021-11-29 ENCOUNTER — Other Ambulatory Visit: Payer: Self-pay | Admitting: Cardiovascular Disease

## 2021-12-03 ENCOUNTER — Encounter: Payer: Self-pay | Admitting: Nurse Practitioner

## 2021-12-03 ENCOUNTER — Other Ambulatory Visit: Payer: Self-pay

## 2021-12-03 DIAGNOSIS — J4520 Mild intermittent asthma, uncomplicated: Secondary | ICD-10-CM

## 2021-12-03 MED ORDER — FLUTICASONE PROPIONATE HFA 110 MCG/ACT IN AERO: INHALATION_SPRAY | RESPIRATORY_TRACT | 3 refills | Status: DC

## 2021-12-05 ENCOUNTER — Ambulatory Visit: Payer: Medicare Other | Admitting: Nurse Practitioner

## 2021-12-12 ENCOUNTER — Telehealth (INDEPENDENT_AMBULATORY_CARE_PROVIDER_SITE_OTHER): Payer: Medicare Other | Admitting: Nurse Practitioner

## 2021-12-12 ENCOUNTER — Other Ambulatory Visit: Payer: Self-pay

## 2021-12-12 VITALS — BP 131/83 | HR 83 | Temp 97.8°F | Resp 16 | Ht 62.0 in | Wt 134.8 lb

## 2021-12-12 DIAGNOSIS — Z9981 Dependence on supplemental oxygen: Secondary | ICD-10-CM | POA: Diagnosis not present

## 2021-12-12 DIAGNOSIS — I480 Paroxysmal atrial fibrillation: Secondary | ICD-10-CM | POA: Diagnosis not present

## 2021-12-12 DIAGNOSIS — M25512 Pain in left shoulder: Secondary | ICD-10-CM

## 2021-12-12 DIAGNOSIS — J4520 Mild intermittent asthma, uncomplicated: Secondary | ICD-10-CM

## 2021-12-12 DIAGNOSIS — G8929 Other chronic pain: Secondary | ICD-10-CM | POA: Diagnosis not present

## 2021-12-12 DIAGNOSIS — E119 Type 2 diabetes mellitus without complications: Secondary | ICD-10-CM

## 2021-12-12 MED ORDER — FREESTYLE LITE TEST VI STRP: ORAL_STRIP | 1 refills | Status: DC

## 2021-12-12 NOTE — Progress Notes (Signed)
[facility] [place], [place] [address]  Internal MEDICINE  Telephone Visit  Patient Name: Ashley Weiss  [id number]  [id number]  Date of Service: 12/12/2021  I connected with the patient at 1050 by telephone and verified the patients identity using two identifiers.   I discussed the limitations, risks, security and privacy concerns of performing an evaluation and management service by telephone and the availability of in person appointments. I also discussed with the patient that there may be a patient responsible charge related to the service.  The patient expressed understanding and agrees to proceed.    Chief Complaint  Patient presents with   Telephone Assessment    [id number]   Telephone Screen   Depression   Hyperlipidemia   Hypertension    HPI Ashley Weiss presents for a telehealth virtual visit for 6 month follow up, patient is 88 yo and unable to come into the office today. She is a well appearing female with hypertension, AFIB, aortic atherosclerosis, hypothyroidism, mild intermittent asthma, and osteoporosis.  BP and other vital signs are stable and well controlled.  --was referred to orthopedic surgery at previous office visit in February for left shoulder pain.    Current Medication: Outpatient Encounter Medications as of 12/12/2021  Medication Sig   albuterol (VENTOLIN HFA) 108 (90 Base) MCG/ACT inhaler Inhale 2 puffs into the lungs every 6 (six) hours as needed for wheezing or shortness of breath.   aspirin EC 81 MG tablet Take 1 tablet (81 mg total) by mouth daily.   b complex vitamins capsule Take 1 capsule by mouth daily.   Blood Glucose Monitoring Suppl (FREESTYLE FREEDOM LITE) w/Device KIT 1 kit by Does not apply route daily.   Calcium Citrate-Vitamin D (CITRACAL + D PO) Take by mouth.   cholecalciferol (VITAMIN D3) 25 MCG (1000 UNIT) tablet Take 1,000 Units by mouth daily.   denosumab (PROLIA) 60 MG/ML SOSY injection Inject 60 mg into the  skin every 6 (six) months.   Fluocinolone Acetonide 0.01 % OIL Place 4 drops in ear(s) at bedtime as needed.    fluticasone (FLONASE) 50 MCG/ACT nasal spray SPRAY 1 SPRAY INTO BOTH NOSTRILS DAILY.   fluticasone (FLOVENT HFA) 110 MCG/ACT inhaler TAKE 2 PUFFS BY MOUTH TWICE A DAY   furosemide (LASIX) 40 MG tablet TAKE 1 TABLET DAILY AS NEEDED FOR WEIGHT GAIN, INCREASED SHORTNESS OF BREATH, OR SWELLING   guaiFENesin (MUCINEX) 600 MG 12 hr tablet Take by mouth 2 (two) times daily.   ipratropium-albuterol (DUONEB) 0.5-2.5 (3) MG/3ML SOLN USE 1 VIAL 3 TIMES DAILY AS NEEDED FOR SHORTNESS OF BREATH DX J44.9   Lancets (FREESTYLE) lancets Use as directed once daily diag e11.65   levothyroxine (SYNTHROID) 25 MCG tablet TAKE 1 DAILY BY MOUTH IN THE MORNING ON A EMPTY STOMACH   metoprolol tartrate (LOPRESSOR) 25 MG tablet TAKE 1 TABLET (25 MG TOTAL) BY MOUTH DAILY AS NEEDED (FOR A FIB).   montelukast (SINGULAIR) 10 MG tablet TAKE 1 TABLET BY MOUTH EVERY DAY FOR COUGH   oxyCODONE (OXY IR/ROXICODONE) 5 MG immediate release tablet Take 0.5-1 tablets (2.5-5 mg total) by mouth 3 (three) times daily as needed for severe pain or moderate pain. (Patient not taking: Reported on 01/14/2022)   oxycodone (OXY-IR) 5 MG capsule Take half to one tab as needed for pain tid (Patient not taking: Reported on 01/14/2022)   OXYGEN Inhale 2 L into the lungs every evening. Sleep with nasal cannula w/ O2 at 2lpm.   potassium chloride (  KLOR-CON M10) 10 MEQ tablet TAKE 1 TABLET BY MOUTH EVERY DAY AS NEEDED ON DAYS WHEN TAKING LASIX   Respiratory Therapy Supplies (ONE FLOW SPIROMETER) DEVI Please use device as tolerated and according to instruction provided.   rosuvastatin (CRESTOR) 5 MG tablet TAKE 1 TABLET (5 MG TOTAL) BY MOUTH DAILY AT 6 PM.   sertraline (ZOLOFT) 100 MG tablet TAKE 1 TABLET BY MOUTH EVERY DAY   [DISCONTINUED] diltiazem (CARDIZEM CD) 120 MG 24 hr capsule TAKE 1 CAPSULE BY MOUTH EVERY DAY   [DISCONTINUED] glucose blood  (FREESTYLE LITE) test strip Use as directed once a daily Diag E11.65   [DISCONTINUED] metoprolol tartrate (LOPRESSOR) 100 MG tablet TAKE 1 TABLET BY MOUTH TWICE A DAY   [DISCONTINUED] Multiple Vitamins-Minerals (MULTIVITAMIN WITH MINERALS) tablet Take 1 tablet by mouth daily.   No facility-administered encounter medications on file as of 12/12/2021.    Surgical History: Past Surgical History:  Procedure Laterality Date   BREAST CYST ASPIRATION Left 20+ yrs ago   CARDIOVERSION N/A 11/11/2018   Procedure: CARDIOVERSION;  Surgeon: Nelva Bush, MD;  Location: [facility] ORS;  Service: Cardiovascular;  Laterality: N/A;   CATARACT EXTRACTION Bilateral 2005,2009   cyst removal-right shoulder  1972   otosclerosis Left 1988   hardening of bone, other 1989 redone   SKIN BIOPSY     left cheek   THYROIDECTOMY  1970   nodule and 1/2 bridge removal   WRIST SURGERY Left 11/29/2009    Medical History: Past Medical History:  Diagnosis Date   Arthritis    Asthma    Basal cell carcinoma 08/02/2014   R check ant to earlobe    Cancer ([place])    skin   Chronic heart failure with preserved ejection fraction (HFpEF) ([place])    a. 10/2018 Echo: EF 55-60%, restrictive filling. Mildly reduced RV fxn. RVSP 58.60mHg. Mod dil LA, mildly dil RA. Mod MR/TR. Ao sclerosis w/o stenosis, triv AI.   Depression    DVT (deep venous thrombosis) ([facility])    Frequent falls    Hyperlipidemia    Hypertension    [place]    a. 10/2018 Amio load followed by DCCV; b. 10/2019 Amio d/c'd ->pulm toxicity; c. CHA2DS2VASc = 6[place]  ASA only.   Phlebitis    Squamous cell carcinoma of skin 10/19/2014   right cheek    Family History: Family History  Problem Relation Age of Onset   Osteoarthritis Mother    Depression Father    Heart failure Sister    Atrial fibrillation Sister    Cancer Sister    Multiple myeloma Brother    Hodgkin's lymphoma Brother    Prostate cancer  Brother    Breast cancer Neg Hx     Social History   Socioeconomic History   Marital status: Widowed    Spouse name: Not on file   Number of children: Not on file   Years of education: Not on file   Highest education level: Not on file  Occupational History   Not on file  Tobacco Use   Smoking status: Never   Smokeless tobacco: Never  Vaping Use   Vaping Use: Never used  Substance and Sexual Activity   Alcohol use: No   Drug use: No   Sexual activity: Never  Other Topics Concern   Not on file  Social History Narrative   Not on file   Social Determinants of Health   Financial Resource Strain: Not on  file  Food Insecurity: Not on file  Transportation Needs: Not on file  Physical Activity: Not on file  Stress: Not on file  Social Connections: Not on file  Intimate Partner Violence: Not on file      Review of Systems  Constitutional:  Negative for chills, fatigue and unexpected weight change.  HENT:  Positive for postnasal drip. Negative for congestion, rhinorrhea, sneezing and sore throat.   Eyes:  Negative for redness.  Respiratory:  Negative for cough, chest tightness and shortness of breath.   Cardiovascular:  Negative for chest pain and palpitations.  Gastrointestinal:  Negative for abdominal pain, constipation, diarrhea, nausea and vomiting.  Genitourinary:  Negative for dysuria and frequency.  Musculoskeletal:  Positive for arthralgias. Negative for back pain, joint swelling and neck pain.  Skin:  Negative for rash.  Neurological: Negative.  Negative for tremors and numbness.  Hematological:  Negative for adenopathy. Does not bruise/bleed easily.  Psychiatric/Behavioral:  Negative for behavioral problems (Depression), sleep disturbance and suicidal ideas. The patient is not nervous/anxious.     Vital Signs: BP 131/83   Pulse 83   Temp 97.8 F (36.6 C)   Resp 16   Ht _0  (1.575 m)   Wt 134 lb 12.8 oz (61.1 kg)   SpO2 99%   BMI 24.66 kg/m     Observation/Objective: She is alert and engages in conversation appropriately. She does not appear to be in any acute distress over video call.     Assessment/Plan: 1. Chronic asthma, mild intermittent, uncomplicated Continue flovent twice daily as prescribed, continue DuoNeb as needed  2. Paroxysmal atrial fibrillation (HCC) Rate controlled, followed by cardiology  3. Oxygen dependent Continue supplemental oxygen use as instructed, followed by pulmonary  4. Chronic left shoulder pain Is being seen by orthopedic now. Was started on prolia.    General Counseling: Monike verbalizes understanding of the findings of today's phone visit and agrees with plan of treatment. I have discussed any further diagnostic evaluation that may be needed or ordered today. We also reviewed her medications today. she has been encouraged to call the office with any questions or concerns that should arise related to todays visit.  Return in about 6 months (around 06/14/2022) for previously scheduled, CPE w/[facility]K.   No orders of the defined types were placed in this encounter.   No orders of the defined types were placed in this encounter.   Time spent:20 Minutes Time spent with patient included reviewing progress notes, labs, imaging studies, and discussing plan for follow up.  [place] Controlled Substance Database was reviewed by me for overdose risk score (ORS) if appropriate.  This patient was seen by Jonetta Osgood, FNP-C in collaboration with Dr. Clayborn Bigness as a part of collaborative care agreement.  Valeen Borys R. Valetta Fuller, MSN, FNP-C Internal medicine

## 2021-12-13 ENCOUNTER — Telehealth: Payer: Self-pay

## 2021-12-13 NOTE — Telephone Encounter (Signed)
Home Care Certification signed and faxed back to [facility] [phone]

## 2021-12-25 DIAGNOSIS — H6123 Impacted cerumen, bilateral: Secondary | ICD-10-CM | POA: Diagnosis not present

## 2021-12-25 DIAGNOSIS — H6063 Unspecified chronic otitis externa, bilateral: Secondary | ICD-10-CM | POA: Diagnosis not present

## 2021-12-25 DIAGNOSIS — H903 Sensorineural hearing loss, bilateral: Secondary | ICD-10-CM | POA: Diagnosis not present

## 2021-12-25 DIAGNOSIS — H9212 Otorrhea, left ear: Secondary | ICD-10-CM | POA: Diagnosis not present

## 2022-01-02 DIAGNOSIS — Z85828 Personal history of other malignant neoplasm of skin: Secondary | ICD-10-CM | POA: Diagnosis not present

## 2022-01-02 DIAGNOSIS — D485 Neoplasm of uncertain behavior of skin: Secondary | ICD-10-CM | POA: Diagnosis not present

## 2022-01-02 DIAGNOSIS — L579 Skin changes due to chronic exposure to nonionizing radiation, unspecified: Secondary | ICD-10-CM | POA: Diagnosis not present

## 2022-01-02 DIAGNOSIS — L814 Other melanin hyperpigmentation: Secondary | ICD-10-CM | POA: Diagnosis not present

## 2022-01-02 DIAGNOSIS — L821 Other seborrheic keratosis: Secondary | ICD-10-CM | POA: Diagnosis not present

## 2022-01-08 NOTE — Progress Notes (Signed)
Notes requested

## 2022-01-14 ENCOUNTER — Encounter: Payer: Self-pay | Admitting: Nurse Practitioner

## 2022-01-14 ENCOUNTER — Ambulatory Visit: Payer: Medicare Other | Attending: Nurse Practitioner | Admitting: Nurse Practitioner

## 2022-01-14 VITALS — BP 130/60 | HR 72 | Ht 62.0 in | Wt 134.0 lb

## 2022-01-14 DIAGNOSIS — I10 Essential (primary) hypertension: Secondary | ICD-10-CM | POA: Diagnosis not present

## 2022-01-14 DIAGNOSIS — I480 Paroxysmal atrial fibrillation: Secondary | ICD-10-CM | POA: Diagnosis not present

## 2022-01-14 DIAGNOSIS — E785 Hyperlipidemia, unspecified: Secondary | ICD-10-CM | POA: Insufficient documentation

## 2022-01-14 DIAGNOSIS — I5032 Chronic diastolic (congestive) heart failure: Secondary | ICD-10-CM | POA: Insufficient documentation

## 2022-01-14 MED ORDER — DILTIAZEM HCL ER COATED BEADS 120 MG PO CP24: ORAL_CAPSULE | ORAL | 1 refills | Status: DC

## 2022-01-14 MED ORDER — METOPROLOL TARTRATE 100 MG PO TABS: 100.0000 mg | ORAL_TABLET | Freq: Two times a day (BID) | ORAL | 1 refills | Status: DC

## 2022-01-14 NOTE — Progress Notes (Signed)
Office Visit    Patient Name: Ashley Weiss Date of Encounter: 01/14/2022  Primary Care Provider:  Lavera Guise, MD Primary Cardiologist:  Ashley Sacramento, MD  Chief Complaint    87 year old female with a history of hypertension, hyperlipidemia, paroxysmal atrial fibrillation, and chronic HFpEF, who presents for follow-up related to paroxysmal atrial fibrillation and chronic HFpEF.  Past Medical History    Past Medical History:  Diagnosis Date   Arthritis    Asthma    Basal cell carcinoma 08/02/2014   R check ant to earlobe    Cancer [facility])    skin   Chronic heart failure with preserved ejection fraction (HFpEF) ([place])    a. 10/2018 Echo: EF 55-60%, restrictive filling. Mildly reduced RV fxn. RVSP 58.4mHg. Mod dil LA, mildly dil RA. Mod MR/TR. Ao sclerosis w/o stenosis, triv AI.   Depression    DVT (deep venous thrombosis) (HCC)    Frequent falls    Hyperlipidemia    Hypertension    Persistent atrial fibrillation ([place]    a. 10/2018 Amio load followed by DCCV; b. 10/2019 Amio d/c'd ->pulm toxicity; c. CHA2DS2VASc = 6-->[place].  ASA only.   Phlebitis    Squamous cell carcinoma of skin 10/19/2014   right cheek   Past Surgical History:  Procedure Laterality Date   BREAST CYST ASPIRATION Left 20+ yrs ago   CARDIOVERSION N/A 11/11/2018   Procedure: CARDIOVERSION;  Surgeon: Ashley Bush MD;  Location: [facility] ORS;  Service: Cardiovascular;  Laterality: N/A;   CATARACT EXTRACTION Bilateral 2005,2009   cyst removal-right shoulder  1972   otosclerosis Left 1988   hardening of bone, other 1989 redone   SKIN BIOPSY     left cheek   THYROIDECTOMY  1970   nodule and 1/2 bridge removal   WRIST SURGERY Left 11/29/2009    Allergies  Allergies  Allergen Reactions   Hydralazine Itching and Swelling   Biaxin [Clarithromycin] Nausea And Vomiting   Ciprofloxacin Other (See Comments)    Hand cramping    Meloxicam Other (See Comments)    Unknown    Minocycline Other (See Comments)    Unknown    History of Present Illness    88year old female with the above past medical history including hypertension, hyperlipidemia, persistent atrial fibrillation, and HFpEF.  A-fib was previously difficult to control, and in July 2020, she was placed on amiodarone and underwent successful cardioversion.  Echo in July 2020 showed an EF of 55 to 60% with moderately dilated left atrium, moderate pulmonary hypertension, and moderate tricuspid regurgitation.  In January 2021, amiodarone was discontinued secondary to concerns for pulmonary toxicity with initiation of oxygen therapy.  Chest x-ray was consistent with interstitial lung disease.  PFT showed severe restrictive lung disease with severely decreased DLCO.  In the setting of recurrent falls, anticoagulation was also discontinued.  Ashley Weiss last seen in cardiology clinic in March 2023 at which time she noted occasional palpitations which were generally brief in nature.  She also reported episodic fleeting chest discomfort occurring once a month or less, lasting a few seconds, and resolving spontaneously.  She was maintaining sinus rhythm on metoprolol and diltiazem therapy.  Since her last visit, she reports feeling well.  She does state that she has some dizziness and head pain when standing up, but states this has been happening for several years (~ 40) and is unchanged.  She participates in chair yoga and occasionally will walk short distances  with her walker outside.  She denies any chest pain, palpitations, shortness of breath, or edema.    Home Medications    Current Outpatient Medications  Medication Sig Dispense Refill   albuterol (VENTOLIN HFA) 108 (90 Base) MCG/ACT inhaler Inhale 2 puffs into the lungs every 6 (six) hours as needed for wheezing or shortness of breath. 18 g 3   aspirin EC 81 MG tablet Take 1 tablet (81 mg total) by mouth daily.     b complex vitamins capsule Take 1 capsule by  mouth daily.     Blood Glucose Monitoring Suppl (FREESTYLE FREEDOM LITE) w/Device KIT 1 kit by Does not apply route daily. 1 each 0   Calcium Citrate-Vitamin D (CITRACAL + D PO) Take by mouth.     cholecalciferol (VITAMIN D3) 25 MCG (1000 UNIT) tablet Take 1,000 Units by mouth daily.     denosumab (PROLIA) 60 MG/ML SOSY injection Inject 60 mg into the skin every 6 (six) months.     Fluocinolone Acetonide 0.01 % OIL Place 4 drops in ear(s) at bedtime as needed.      fluticasone (FLONASE) 50 MCG/ACT nasal spray SPRAY 1 SPRAY INTO BOTH NOSTRILS DAILY. 48 mL 3   fluticasone (FLOVENT HFA) 110 MCG/ACT inhaler TAKE 2 PUFFS BY MOUTH TWICE A DAY 36 each 3   furosemide (LASIX) 40 MG tablet TAKE 1 TABLET DAILY AS NEEDED FOR WEIGHT GAIN, INCREASED SHORTNESS OF BREATH, OR SWELLING 90 tablet 0   glucose blood (FREESTYLE LITE) test strip Use as directed once a daily Diag E11.65 100 each 1   guaiFENesin (MUCINEX) 600 MG 12 hr tablet Take by mouth 2 (two) times daily.     ipratropium-albuterol (DUONEB) 0.5-2.5 (3) MG/3ML SOLN USE 1 VIAL 3 TIMES DAILY AS NEEDED FOR SHORTNESS OF BREATH DX J44.9 810 mL 1   Lancets (FREESTYLE) lancets Use as directed once daily diag e11.65 100 each 12   levothyroxine (SYNTHROID) 25 MCG tablet TAKE 1 DAILY BY MOUTH IN THE MORNING ON A EMPTY STOMACH 90 tablet 1   metoprolol tartrate (LOPRESSOR) 25 MG tablet TAKE 1 TABLET (25 MG TOTAL) BY MOUTH DAILY AS NEEDED (FOR A FIB). 90 tablet 0   montelukast (SINGULAIR) 10 MG tablet TAKE 1 TABLET BY MOUTH EVERY DAY FOR COUGH 90 tablet 3   OXYGEN Inhale 2 L into the lungs every evening. Sleep with nasal cannula w/ O2 at 2lpm.     potassium chloride (KLOR-CON M10) 10 MEQ tablet TAKE 1 TABLET BY MOUTH EVERY DAY AS NEEDED ON DAYS WHEN TAKING LASIX 90 tablet 0   Respiratory Therapy Supplies (ONE FLOW SPIROMETER) DEVI Please use device as tolerated and according to instruction provided. 1 Device 0   rosuvastatin (CRESTOR) 5 MG tablet TAKE 1 TABLET (5 MG  TOTAL) BY MOUTH DAILY AT 6 PM. 90 tablet 0   sertraline (ZOLOFT) 100 MG tablet TAKE 1 TABLET BY MOUTH EVERY DAY 90 tablet 2   diltiazem (CARDIZEM CD) 120 MG 24 hr capsule TAKE 1 CAPSULE BY MOUTH EVERY DAY 90 capsule 1   metoprolol tartrate (LOPRESSOR) 100 MG tablet Take 1 tablet (100 mg total) by mouth 2 (two) times daily. 180 tablet 1   oxyCODONE (OXY IR/ROXICODONE) 5 MG immediate release tablet Take 0.5-1 tablets (2.5-5 mg total) by mouth 3 (three) times daily as needed for severe pain or moderate pain. (Patient not taking: Reported on 01/14/2022) 15 tablet 0   oxycodone (OXY-IR) 5 MG capsule Take half to one tab as needed for  pain tid (Patient not taking: Reported on 01/14/2022) 15 capsule 0   No current facility-administered medications for this visit.     Review of Systems    +++ ongoing dizziness when she stands up, unchanged from previous visits.  She denies any chest pain, shortness of breath, palpitations, nausea, vomiting, syncope, edema, weight gain, or early satiety.  All other systems reviewed and are otherwise negative except as noted above.   Physical Exam    VS:  BP 130/60 (BP Location: Right Arm, Patient Position: Sitting, Cuff Size: Normal)   Pulse 72   Ht _0  (1.575 m)   Wt 134 lb (60.8 kg)   SpO2 96% Comment: on 1L O2  BMI 24.51 kg/m  , BMI Body mass index is 24.51 kg/m.     GEN: Well nourished, well developed, in no acute distress. HEENT: normal. Neck: Supple, no JVD, carotid bruits, or masses. Cardiac: RRR,  1/6 syst murmur, no rubs, or gallops. No clubbing, cyanosis, edema.  Radials/PT 2+ and equal bilaterally.  Respiratory:  Respirations regular and unlabored. Diffuse crackles scattered throughout lung fields.  GI: Soft, nontender, nondistended, BS + x 4. MS: no deformity or atrophy. Skin: warm and dry, no rash. Neuro:  Strength and sensation are intact. Psych: Normal affect.  Accessory Clinical Findings    ECG personally reviewed by me today - normal  sinus rhythm, 72, lvh - no acute changes.   Lab Results  Component Value Date   WBC 10.1 05/28/2021   HGB 12.7 05/28/2021   HCT 37.8 05/28/2021   MCV 96 05/28/2021   PLT 218 05/28/2021   Lab Results  Component Value Date   CREATININE 0.56 (L) 05/28/2021   BUN 13 05/28/2021   NA 139 05/28/2021   K 4.9 05/28/2021   CL 94 (L) 05/28/2021   CO2 30 (H) 05/28/2021   Lab Results  Component Value Date   ALT 11 05/28/2021   AST 18 05/28/2021   ALKPHOS 87 05/28/2021   BILITOT 0.4 05/28/2021   Lab Results  Component Value Date   CHOL 130 05/28/2021   HDL 51 05/28/2021   LDLCALC 61 05/28/2021   TRIG 97 05/28/2021   CHOLHDL 2.6 11/11/2018    Lab Results  Component Value Date   HGBA1C 5.1 02/03/2019    Assessment & Plan    1.  Paroxysmal Afib: Since her last visit, patient states she has been feeling well.  EKG today shows normal sinus rhythm.  She denies any chest pain or palpitations. Will continue to refrain from anticoagulation therapy d/t high risk of falls.  We will continue metoprolol and diltiazem.   2. Essential hypertension: Blood pressure in clinic today 130/60. Continue metoprolol 127m BID and diltiazem 1237mdaily.   3. Chronic HFpEF: Patient records daily weight at home. Euvolemic on exam today.  Educated patient and daughter on signs of increased fluid volume including increases in weight, shortness of breath, and lower extremity edema.  We will continue prn Lasix for weight gain and shortness of breath.   4.: Hyperlipidemia: LDL of 61 in Feb 2023. Continue statin.   5: Disposition: F/u in 6 months or sooner if necessary.    ChMurray HodgkinsNP 01/14/2022, 1:25 PM

## 2022-01-14 NOTE — Patient Instructions (Signed)
Medication Instructions:  Your physician recommends that you continue on your current medications as directed. Please refer to the Current Medication list given to you today.  *If you need a refill on your cardiac medications before your next appointment, please call your pharmacy*   Lab Work: None ordered  If you have labs (blood work) drawn today and your tests are completely normal, you will receive your results only by: [place] (if you have MyChart) OR A paper copy in the mail If you have any lab test that is abnormal or we need to change your treatment, we will call you to review the results.   Testing/Procedures: None ordered  Follow-Up: At [facility], you and your health needs are our priority.  As part of our continuing mission to provide you with exceptional heart care, we have created designated Provider Care Teams.  These Care Teams include your primary Cardiologist (physician) and Advanced Practice Providers (APPs -  Physician Assistants and Nurse Practitioners) who all work together to provide you with the care you need, when you need it.  We recommend signing up for the patient portal called "MyChart".  Sign up information is provided on this After Visit Summary.  MyChart is used to connect with patients for Virtual Visits (Telemedicine).  Patients are able to view lab/test results, encounter notes, upcoming appointments, etc.  Non-urgent messages can be sent to your provider as well.   To learn more about what you can do with MyChart, go to [email or web address].    Your next appointment:   6 month(s)  The format for your next appointment:   In Person  Provider:   Kathlyn Sacramento, MD   Important Information About Sugar

## 2022-02-01 DIAGNOSIS — H6063 Unspecified chronic otitis externa, bilateral: Secondary | ICD-10-CM | POA: Diagnosis not present

## 2022-02-01 DIAGNOSIS — H903 Sensorineural hearing loss, bilateral: Secondary | ICD-10-CM | POA: Diagnosis not present

## 2022-02-01 DIAGNOSIS — H9212 Otorrhea, left ear: Secondary | ICD-10-CM | POA: Diagnosis not present

## 2022-02-01 DIAGNOSIS — H6123 Impacted cerumen, bilateral: Secondary | ICD-10-CM | POA: Diagnosis not present

## 2022-02-04 ENCOUNTER — Encounter: Payer: Self-pay | Admitting: Internal Medicine

## 2022-02-07 DIAGNOSIS — Z23 Encounter for immunization: Secondary | ICD-10-CM | POA: Diagnosis not present

## 2022-02-10 ENCOUNTER — Encounter: Payer: Self-pay | Admitting: Nurse Practitioner

## 2022-02-16 DIAGNOSIS — Z23 Encounter for immunization: Secondary | ICD-10-CM | POA: Diagnosis not present

## 2022-02-22 DIAGNOSIS — M810 Age-related osteoporosis without current pathological fracture: Secondary | ICD-10-CM | POA: Diagnosis not present

## 2022-03-02 ENCOUNTER — Other Ambulatory Visit: Payer: Self-pay | Admitting: Cardiovascular Disease

## 2022-03-04 ENCOUNTER — Telehealth: Payer: Self-pay | Admitting: Internal Medicine

## 2022-03-04 NOTE — Telephone Encounter (Signed)
Received 55/7/32-2/0/25 home care re-cert from [employer]. Gave to dfk for signature-Toni

## 2022-03-04 NOTE — Telephone Encounter (Signed)
34/9/61-04/27/41 home care re-cert signed. Faxed back to [place]; [phone]

## 2022-03-27 DIAGNOSIS — H6123 Impacted cerumen, bilateral: Secondary | ICD-10-CM | POA: Diagnosis not present

## 2022-03-27 DIAGNOSIS — H9212 Otorrhea, left ear: Secondary | ICD-10-CM | POA: Diagnosis not present

## 2022-03-27 DIAGNOSIS — H6063 Unspecified chronic otitis externa, bilateral: Secondary | ICD-10-CM | POA: Diagnosis not present

## 2022-03-27 DIAGNOSIS — H903 Sensorineural hearing loss, bilateral: Secondary | ICD-10-CM | POA: Diagnosis not present

## 2022-04-18 ENCOUNTER — Other Ambulatory Visit: Payer: Self-pay | Admitting: Nurse Practitioner

## 2022-04-18 ENCOUNTER — Other Ambulatory Visit: Payer: Self-pay | Admitting: Internal Medicine

## 2022-04-26 ENCOUNTER — Telehealth: Payer: Self-pay

## 2022-04-26 NOTE — Telephone Encounter (Signed)
Patient daughter called to let us know that her mother fell and that she was only hurting on her left side, spoke with alyssa and she said patient should probably go to urgent care to get an x-ray. Patient doesn't want to go urgent care so I told her to keep an eye on her and if she develops any bruise or starts to hurt worse to get an x-ray done and to also give patient tylenol every 4-6 hours.

## 2022-05-14 ENCOUNTER — Other Ambulatory Visit: Payer: Self-pay | Admitting: Internal Medicine

## 2022-05-22 DIAGNOSIS — H6063 Unspecified chronic otitis externa, bilateral: Secondary | ICD-10-CM | POA: Diagnosis not present

## 2022-05-22 DIAGNOSIS — H903 Sensorineural hearing loss, bilateral: Secondary | ICD-10-CM | POA: Diagnosis not present

## 2022-05-22 DIAGNOSIS — H6123 Impacted cerumen, bilateral: Secondary | ICD-10-CM | POA: Diagnosis not present

## 2022-05-22 DIAGNOSIS — H9212 Otorrhea, left ear: Secondary | ICD-10-CM | POA: Diagnosis not present

## 2022-05-28 ENCOUNTER — Encounter: Payer: Self-pay | Admitting: Internal Medicine

## 2022-05-28 ENCOUNTER — Ambulatory Visit (INDEPENDENT_AMBULATORY_CARE_PROVIDER_SITE_OTHER): Payer: Medicare Other | Admitting: Internal Medicine

## 2022-05-28 DIAGNOSIS — I700 Atherosclerosis of aorta: Secondary | ICD-10-CM

## 2022-05-28 DIAGNOSIS — J9611 Chronic respiratory failure with hypoxia: Secondary | ICD-10-CM | POA: Diagnosis not present

## 2022-05-28 DIAGNOSIS — R300 Dysuria: Secondary | ICD-10-CM

## 2022-05-28 DIAGNOSIS — I480 Paroxysmal atrial fibrillation: Secondary | ICD-10-CM

## 2022-05-28 DIAGNOSIS — Z0001 Encounter for general adult medical examination with abnormal findings: Secondary | ICD-10-CM

## 2022-05-28 DIAGNOSIS — I5032 Chronic diastolic (congestive) heart failure: Secondary | ICD-10-CM

## 2022-05-28 DIAGNOSIS — K649 Unspecified hemorrhoids: Secondary | ICD-10-CM

## 2022-05-28 MED ORDER — POTASSIUM CHLORIDE CRYS ER 10 MEQ PO TBCR: EXTENDED_RELEASE_TABLET | ORAL | 0 refills | Status: DC

## 2022-05-28 MED ORDER — HYDROCORTISONE ACETATE 25 MG RE SUPP: RECTAL | 0 refills | Status: DC

## 2022-05-28 MED ORDER — FUROSEMIDE 20 MG PO TABS: ORAL_TABLET | ORAL | 0 refills | Status: DC

## 2022-05-28 NOTE — Progress Notes (Signed)
[facility] [place], [place] [address]  Internal MEDICINE  Office Visit Note  Patient Name: Ashley Weiss  [id number]  [id number]  Date of Service: 05/30/2022  Chief Complaint  Patient presents with   Medicare Wellness   Depression   Hyperlipidemia   Hypertension   Quality Metric Gaps    TDAP   Medication Refill    Hydrocortisone suppositories '25mg'$ , Furosemide '40mg'$ , Klor-Con M10     HPI Pt is here for routine health maintenance examination. She is 89 years old :) brought in by her daughter  Has chronic medical problems but stable C/O hemorrhoid flare up  Takes all medications as prescribed Fall precautions in place for her  Getting IV Biphosphonates  Takes lasix and K  as needed   Current Medication: Outpatient Encounter Medications as of 05/28/2022  Medication Sig   albuterol (VENTOLIN HFA) 108 (90 Base) MCG/ACT inhaler Inhale 2 puffs into the lungs every 6 (six) hours as needed for wheezing or shortness of breath.   aspirin EC 81 MG tablet Take 1 tablet (81 mg total) by mouth daily.   b complex vitamins capsule Take 1 capsule by mouth daily.   Blood Glucose Monitoring Suppl (FREESTYLE FREEDOM LITE) w/Device KIT 1 kit by Does not apply route daily.   Calcium Citrate-Vitamin D (CITRACAL + D PO) Take by mouth.   cholecalciferol (VITAMIN D3) 25 MCG (1000 UNIT) tablet Take 1,000 Units by mouth daily.   denosumab (PROLIA) 60 MG/ML SOSY injection Inject 60 mg into the skin every 6 (six) months.   diltiazem (CARDIZEM CD) 120 MG 24 hr capsule TAKE 1 CAPSULE BY MOUTH EVERY DAY   Fluocinolone Acetonide 0.01 % OIL Place 4 drops in ear(s) at bedtime as needed.    fluticasone (FLONASE) 50 MCG/ACT nasal spray INSTILL 1 SPRAY INTO BOTH NOSTRILS DAILY   fluticasone (FLOVENT HFA) 110 MCG/ACT inhaler TAKE 2 PUFFS BY MOUTH TWICE A DAY   furosemide (LASIX) 20 MG tablet Take one tab po qd as needed   glucose blood (FREESTYLE LITE) test strip Use as directed once  a daily Diag E11.65   guaiFENesin (MUCINEX) 600 MG 12 hr tablet Take by mouth 2 (two) times daily.   hydrocortisone (ANUSOL-HC) 25 MG suppository Insert one after EACH bm QD   ipratropium-albuterol (DUONEB) 0.5-2.5 (3) MG/3ML SOLN USE 1 VIAL 3 TIMES DAILY AS NEEDED FOR SHORTNESS OF BREATH DX J44.9   Lancets (FREESTYLE) lancets Use as directed once daily diag e11.65   levothyroxine (SYNTHROID) 25 MCG tablet TAKE 1 DAILY BY MOUTH IN THE MORNING ON A EMPTY STOMACH   metoprolol tartrate (LOPRESSOR) 100 MG tablet Take 1 tablet (100 mg total) by mouth 2 (two) times daily.   metoprolol tartrate (LOPRESSOR) 25 MG tablet TAKE 1 TABLET (25 MG TOTAL) BY MOUTH DAILY AS NEEDED (FOR A FIB).   montelukast (SINGULAIR) 10 MG tablet TAKE 1 TABLET BY MOUTH EVERY DAY FOR COUGH   oxyCODONE (OXY IR/ROXICODONE) 5 MG immediate release tablet Take 0.5-1 tablets (2.5-5 mg total) by mouth 3 (three) times daily as needed for severe pain or moderate pain.   oxycodone (OXY-IR) 5 MG capsule Take half to one tab as needed for pain tid   OXYGEN Inhale 2 L into the lungs every evening. Sleep with nasal cannula w/ O2 at 2lpm.   Respiratory Therapy Supplies (ONE FLOW SPIROMETER) DEVI Please use device as tolerated and according to instruction provided.   rosuvastatin (CRESTOR) 5 MG tablet TAKE 1 TABLET (5  MG TOTAL) BY MOUTH DAILY AT 6 PM.   sertraline (ZOLOFT) 100 MG tablet TAKE 1 TABLET BY MOUTH EVERY DAY   [DISCONTINUED] furosemide (LASIX) 40 MG tablet TAKE 1 TABLET DAILY AS NEEDED FOR WEIGHT GAIN, INCREASED SHORTNESS OF BREATH, OR SWELLING   [DISCONTINUED] potassium chloride (KLOR-CON M10) 10 MEQ tablet TAKE 1 TABLET BY MOUTH EVERY DAY AS NEEDED ON DAYS WHEN TAKING LASIX   potassium chloride (KLOR-CON M10) 10 MEQ tablet TAKE 1 TABLET BY MOUTH EVERY DAY AS NEEDED ON DAYS WHEN TAKING LASIX   No facility-administered encounter medications on file as of 05/28/2022.    Surgical History: Past Surgical History:  Procedure Laterality  Date   BREAST CYST ASPIRATION Left 20+ yrs ago   CARDIOVERSION N/A 11/11/2018   Procedure: CARDIOVERSION;  Surgeon: Nelva Bush, MD;  Location: [facility] ORS;  Service: Cardiovascular;  Laterality: N/A;   CATARACT EXTRACTION Bilateral 2005,2009   cyst removal-right shoulder  1972   otosclerosis Left 1988   hardening of bone, other 1989 redone   SKIN BIOPSY     left cheek   THYROIDECTOMY  1970   nodule and 1/2 bridge removal   WRIST SURGERY Left 11/29/2009    Medical History: Past Medical History:  Diagnosis Date   Arthritis    Asthma    Basal cell carcinoma 08/02/2014   R check ant to earlobe    Cancer ([place])    skin   Chronic heart failure with preserved ejection fraction (HFpEF) ([place])    a. 10/2018 Echo: EF 55-60%, restrictive filling. Mildly reduced RV fxn. RVSP 58.49mHg. Mod dil LA, mildly dil RA. Mod MR/TR. Ao sclerosis w/o stenosis, triv AI.   Depression    DVT (deep venous thrombosis) (HCC)    Frequent falls    Hyperlipidemia    Hypertension    Persistent atrial fibrillation ([place]    a. 10/2018 Amio load followed by DCCV; b. 10/2019 Amio d/c'd ->pulm toxicity; c. CHA2DS2VASc = 6-->[place].  ASA only.   Phlebitis    Squamous cell carcinoma of skin 10/19/2014   right cheek    Family History: Family History  Problem Relation Age of Onset   Osteoarthritis Mother    Depression Father    Heart failure Sister    Atrial fibrillation Sister    Cancer Sister    Multiple myeloma Brother    Hodgkin's lymphoma Brother    Prostate cancer Brother    Breast cancer Neg Hx     Social History: Social History   Socioeconomic History   Marital status: Widowed    Spouse name: Not on file   Number of children: Not on file   Years of education: Not on file   Highest education level: Not on file  Occupational History   Not on file  Tobacco Use   Smoking status: Never   Smokeless tobacco: Never  Vaping Use   Vaping Use: Never used  Substance and  Sexual Activity   Alcohol use: No   Drug use: No   Sexual activity: Never  Other Topics Concern   Not on file  Social History Narrative   Not on file   Social Determinants of Health   Financial Resource Strain: Not on file  Food Insecurity: Not on file  Transportation Needs: Not on file  Physical Activity: Not on file  Stress: Not on file  Social Connections: Not on file      Review of Systems  Constitutional:  Negative for chills,  fatigue and unexpected weight change.  HENT:  Positive for postnasal drip. Negative for congestion, rhinorrhea, sneezing and sore throat.   Eyes:  Negative for redness.  Respiratory:  Negative for cough, chest tightness and shortness of breath.   Cardiovascular:  Negative for chest pain and palpitations.  Gastrointestinal:  Negative for abdominal pain, constipation, diarrhea, nausea and vomiting.  Genitourinary:  Negative for dysuria and frequency.  Musculoskeletal:  Negative for arthralgias, back pain, joint swelling and neck pain.  Skin:  Negative for rash.  Neurological: Negative.  Negative for tremors and numbness.  Hematological:  Negative for adenopathy. Does not bruise/bleed easily.  Psychiatric/Behavioral:  Negative for behavioral problems (Depression), sleep disturbance and suicidal ideas. The patient is not nervous/anxious.      Vital Signs: BP (!) 145/80   Pulse 83   Temp 97.6 F (36.4 C)   Resp 16   Ht '5\' 2"'$  (1.575 m)   Wt 140 lb (63.5 kg)   SpO2 96%   BMI 25.61 kg/m    Physical Exam Constitutional:      Appearance: Normal appearance.  HENT:     Head: Normocephalic and atraumatic.     Nose: Nose normal.     Mouth/Throat:     Mouth: Mucous membranes are moist.     Pharynx: No posterior oropharyngeal erythema.  Eyes:     Extraocular Movements: Extraocular movements intact.     Pupils: Pupils are equal, round, and reactive to light.  Cardiovascular:     Pulses: Normal pulses.     Heart sounds: Normal heart sounds.   Pulmonary:     Effort: Pulmonary effort is normal.     Breath sounds: Normal breath sounds.  Neurological:     General: No focal deficit present.     Mental Status: She is alert.  Psychiatric:        Mood and Affect: Mood normal.        Behavior: Behavior normal.    Assessment/Plan: 1. Encounter for general adult medical examination with abnormal findings Pt is UTD on PHM, will continue to monitor   2. Chronic respiratory failure with hypoxia (HCC) Continue home O2  3. Hemorrhoids, unspecified Refills for [facility] suppository is given, increase fibre as tolerated   4. Chronic heart failure with preserved ejection fraction (HFpEF) (HCC) Stable   5. Atherosclerosis of aorta (HCC) On Statin   6. PAF (paroxysmal atrial fibrillation) (HCC) Heart rate is controlled   7. Dysuria U/A    General Counseling: Rhys verbalizes understanding of the findings of todays visit and agrees with plan of treatment. I have discussed any further diagnostic evaluation that may be needed or ordered today. We also reviewed her medications today. she has been encouraged to call the office with any questions or concerns that should arise related to todays visit.    Counseling:   Controlled Substance Database was reviewed by me.  No orders of the defined types were placed in this encounter.   Meds ordered this encounter  Medications   furosemide (LASIX) 20 MG tablet    Sig: Take one tab po qd as needed    Dispense:  45 tablet    Refill:  0   potassium chloride (KLOR-CON M10) 10 MEQ tablet    Sig: TAKE 1 TABLET BY MOUTH EVERY DAY AS NEEDED ON DAYS WHEN TAKING LASIX    Dispense:  45 tablet    Refill:  0   hydrocortisone (ANUSOL-HC) 25 MG suppository    Sig: Insert one after  EACH bm QD    Dispense:  25 suppository    Refill:  0    Total time spent:45 Minutes  Time spent includes review of chart, medications, test results, and follow up plan with the patient.     Lavera Guise,  MD  Internal Medicine

## 2022-05-30 DIAGNOSIS — J8410 Pulmonary fibrosis, unspecified: Secondary | ICD-10-CM | POA: Insufficient documentation

## 2022-05-30 DIAGNOSIS — J9611 Chronic respiratory failure with hypoxia: Secondary | ICD-10-CM | POA: Insufficient documentation

## 2022-05-30 DIAGNOSIS — E119 Type 2 diabetes mellitus without complications: Secondary | ICD-10-CM | POA: Insufficient documentation

## 2022-05-31 ENCOUNTER — Other Ambulatory Visit: Payer: Self-pay | Admitting: Nurse Practitioner

## 2022-06-01 ENCOUNTER — Other Ambulatory Visit: Payer: Self-pay | Admitting: Nurse Practitioner

## 2022-06-02 ENCOUNTER — Other Ambulatory Visit: Payer: Self-pay | Admitting: Internal Medicine

## 2022-06-02 DIAGNOSIS — J4520 Mild intermittent asthma, uncomplicated: Secondary | ICD-10-CM

## 2022-06-03 ENCOUNTER — Other Ambulatory Visit: Payer: Self-pay | Admitting: Internal Medicine

## 2022-06-03 DIAGNOSIS — J4520 Mild intermittent asthma, uncomplicated: Secondary | ICD-10-CM

## 2022-06-07 ENCOUNTER — Telehealth: Payer: Self-pay

## 2022-06-07 NOTE — Telephone Encounter (Signed)
Completed P.A. for patient's Flovent.

## 2022-06-13 ENCOUNTER — Telehealth: Payer: Self-pay | Admitting: Internal Medicine

## 2022-06-13 NOTE — Telephone Encounter (Signed)
Received [employer] home care certification. Gave to dfk for signature-nm

## 2022-06-13 NOTE — Telephone Encounter (Signed)
[place] order signed. Faxed back; [phone]. To be scanned-nm

## 2022-06-13 NOTE — Telephone Encounter (Signed)
error 

## 2022-06-13 NOTE — Telephone Encounter (Deleted)
Received [employer] home care certification. Gave to dfk for signature-nm

## 2022-06-21 ENCOUNTER — Telehealth: Payer: Self-pay

## 2022-06-21 NOTE — Telephone Encounter (Signed)
Completed new P.A. for patient's Fluticasone 90 day supply.

## 2022-06-26 ENCOUNTER — Telehealth: Payer: Self-pay

## 2022-06-26 NOTE — Telephone Encounter (Signed)
Completed appeal for patient's fluticasone.

## 2022-07-02 ENCOUNTER — Encounter: Payer: Self-pay | Admitting: Internal Medicine

## 2022-07-02 ENCOUNTER — Telehealth: Payer: Self-pay

## 2022-07-03 DIAGNOSIS — L905 Scar conditions and fibrosis of skin: Secondary | ICD-10-CM | POA: Diagnosis not present

## 2022-07-07 ENCOUNTER — Other Ambulatory Visit: Payer: Self-pay | Admitting: Internal Medicine

## 2022-07-08 ENCOUNTER — Telehealth: Payer: Self-pay

## 2022-07-08 NOTE — Telephone Encounter (Signed)
Spoke with pt daughter that we did PA again we will let her know once we get back answer

## 2022-07-08 NOTE — Telephone Encounter (Signed)
Completed P.A. over the phone with insurance company for patient's Fluticasone 166mcg. They will process and expedited appeal, will take up to 7 days. I submitted additional office notes.

## 2022-07-10 ENCOUNTER — Telehealth: Payer: Self-pay

## 2022-07-10 NOTE — Telephone Encounter (Signed)
Pt  daughter advised that generic for Flovent is approved

## 2022-07-12 NOTE — Telephone Encounter (Signed)
Patient approved for Fluticasone.

## 2022-07-18 ENCOUNTER — Ambulatory Visit: Payer: Medicare Other | Attending: Cardiovascular Disease | Admitting: Cardiovascular Disease

## 2022-07-18 ENCOUNTER — Encounter: Payer: Self-pay | Admitting: Cardiovascular Disease

## 2022-07-18 VITALS — BP 140/80 | HR 83 | Ht 62.0 in | Wt 136.0 lb

## 2022-07-18 DIAGNOSIS — I4819 Other persistent atrial fibrillation: Secondary | ICD-10-CM | POA: Diagnosis not present

## 2022-07-18 DIAGNOSIS — I5032 Chronic diastolic (congestive) heart failure: Secondary | ICD-10-CM | POA: Insufficient documentation

## 2022-07-18 DIAGNOSIS — I10 Essential (primary) hypertension: Secondary | ICD-10-CM | POA: Diagnosis not present

## 2022-07-18 NOTE — Patient Instructions (Signed)
Medication Instructions:  No changes *If you need a refill on your cardiac medications before your next appointment, please call your pharmacy*   Lab Work: None ordered If you have labs (blood work) drawn today and your tests are completely normal, you will receive your results only by: MyChart Message (if you have MyChart) OR A paper copy in the mail If you have any lab test that is abnormal or we need to change your treatment, we will call you to review the results.   Testing/Procedures: None ordered   Follow-Up: At [facility], you and your health needs are our priority.  As part of our continuing mission to provide you with exceptional heart care, we have created designated Provider Care Teams.  These Care Teams include your primary Cardiologist (physician) and Advanced Practice Providers (APPs -  Physician Assistants and Nurse Practitioners) who all work together to provide you with the care you need, when you need it.  We recommend signing up for the patient portal called "MyChart".  Sign up information is provided on this After Visit Summary.  MyChart is used to connect with patients for Virtual Visits (Telemedicine).  Patients are able to view lab/test results, encounter notes, upcoming appointments, etc.  Non-urgent messages can be sent to your provider as well.   To learn more about what you can do with MyChart, go to [email or web address].    Your next appointment:   6 month(s)  Provider:   You may see Muhammad Arida, MD or one of the following Advanced Practice Providers on your designated Care Team:   Christopher Berge, NP Ryan Dunn, PA-C Cadence Furth, PA-C Sheri Hammock, NP    

## 2022-07-18 NOTE — Progress Notes (Signed)
Cardiology Office Note   Date:  07/18/2022   ID:  Ashley Weiss, Alferd Apa February 07, 1920, MRN [id number]  PCP:  Lavera Guise, MD  Cardiologist:   Kathlyn Sacramento, MD   Chief Complaint  Patient presents with   6 month follow up     Patient c/o shortness of breath & chest pain that comes and goes. Medications reviewed by the patient verbally.       History of Present Illness: Ashley Weiss is a 89 y.o. female who is here today for a follow-up regarding atrial fibrillation and chronic diastolic heart failure.   She has history of hyperlipidemia, essential hypertension, hyperlipidemia and persistent atrial fibrillation.   Rate control was very difficult  and ultimately she underwent successful cardioversion in July, 2020 after starting amiodarone. Echocardiogram in July,2020 showed an EF of 55 to 60% with moderately dilated left atrium, moderate pulmonary hypertension and moderate tricuspid regurgitation. Amiodarone was stopped in January of 2021 due to concerns about lung toxicity requiring oxygen therapy.  Chest x-ray showed interstitial lung disease. Pulmonary function testing showed severe restrictive lung disease with severely decreased DLCO. She had recurrent falls resulting in physical injuries and thus it was decided to stop anticoagulation.   She has been maintaining in sinus rhythm with diltiazem and metoprolol.  She has very strong social support from her 2 daughters. She lives with her daughter in [place].  She has been doing reasonably well with no recent chest pain or shortness of breath.  She does complain of occasional palpitations but no prolonged tachycardia.  She is mostly wheelchair-bound and afraid to walk due to instability.   Past Medical History:  Diagnosis Date   Arthritis    Asthma    Basal cell carcinoma 08/02/2014   R check ant to earlobe    Cancer [facility])    skin   Chronic heart failure with preserved ejection fraction (HFpEF) ([place])    a. 10/2018 Echo:  EF 55-60%, restrictive filling. Mildly reduced RV fxn. RVSP 58.26mmHg. Mod dil LA, mildly dil RA. Mod MR/TR. Ao sclerosis w/o stenosis, triv AI.   Depression    DVT (deep venous thrombosis) (HCC)    Frequent falls    Hyperlipidemia    Hypertension    Persistent atrial fibrillation ([place])    a. 10/2018 Amio load followed by DCCV; b. 10/2019 Amio d/c'd ->pulm toxicity; c. CHA2DS2VASc = 6-->prev on [place] but d/c'd 2/2 falls.  ASA only.   Phlebitis    Squamous cell carcinoma of skin 10/19/2014   right cheek    Past Surgical History:  Procedure Laterality Date   BREAST CYST ASPIRATION Left 20+ yrs ago   CARDIOVERSION N/A 11/11/2018   Procedure: CARDIOVERSION;  Surgeon: Nelva Bush, MD;  Location: [facility] ORS;  Service: Cardiovascular;  Laterality: N/A;   CATARACT EXTRACTION Bilateral 2005,2009   cyst removal-right shoulder  1972   otosclerosis Left 1988   hardening of bone, other 1989 redone   SKIN BIOPSY     left cheek   THYROIDECTOMY  1970   nodule and 1/2 bridge removal   WRIST SURGERY Left 11/29/2009     Current Outpatient Medications  Medication Sig Dispense Refill   albuterol (VENTOLIN HFA) 108 (90 Base) MCG/ACT inhaler Inhale 2 puffs into the lungs every 6 (six) hours as needed for wheezing or shortness of breath. 18 g 3   aspirin EC 81 MG tablet Take 1 tablet (81 mg total) by mouth daily.     b complex vitamins  capsule Take 1 capsule by mouth daily.     B Complex-C-Calcium (GNP B-COMPLEX PLUS VITAMIN C) TABS      Blood Glucose Monitoring Suppl (FREESTYLE FREEDOM LITE) w/Device KIT 1 kit by Does not apply route daily. 1 each 0   Calcium Citrate-Vitamin D (CITRACAL + D PO) Take by mouth.     cholecalciferol (VITAMIN D3) 25 MCG (1000 UNIT) tablet Take 1,000 Units by mouth daily.     denosumab (PROLIA) 60 MG/ML SOSY injection Inject 60 mg into the skin every 6 (six) months.     diltiazem (CARDIZEM CD) 120 MG 24 hr capsule TAKE 1 CAPSULE BY MOUTH EVERY DAY 90 capsule 1    Fluocinolone Acetonide 0.01 % OIL Place 4 drops in ear(s) at bedtime as needed.      fluticasone (FLONASE) 50 MCG/ACT nasal spray INSTILL 1 SPRAY INTO BOTH NOSTRILS DAILY 48 mL 3   fluticasone (FLOVENT HFA) 110 MCG/ACT inhaler TAKE 2 PUFFS BY MOUTH TWICE A DAY 12 each 3   furosemide (LASIX) 20 MG tablet TAKE 1 TABLET BY MOUTH EVERY DAY AS NEEDED 90 tablet 1   glucose blood (FREESTYLE LITE) test strip Use as directed once a daily Diag E11.65 100 each 1   guaiFENesin (MUCINEX) 600 MG 12 hr tablet Take by mouth 2 (two) times daily.     hydrocortisone (ANUSOL-HC) 25 MG suppository Insert one after EACH bm QD 25 suppository 0   ipratropium-albuterol (DUONEB) 0.5-2.5 (3) MG/3ML SOLN USE 1 VIAL 3 TIMES DAILY AS NEEDED FOR SHORTNESS OF BREATH DX J44.9 810 mL 1   Lancets (FREESTYLE) lancets Use as directed once daily diag e11.65 100 each 12   levothyroxine (SYNTHROID) 25 MCG tablet TAKE 1 DAILY BY MOUTH IN THE MORNING ON A EMPTY STOMACH 90 tablet 1   metoprolol tartrate (LOPRESSOR) 100 MG tablet TAKE 1 TABLET BY MOUTH TWICE A DAY 180 tablet 0   metoprolol tartrate (LOPRESSOR) 25 MG tablet TAKE 1 TABLET (25 MG TOTAL) BY MOUTH DAILY AS NEEDED (FOR A FIB). 90 tablet 0   montelukast (SINGULAIR) 10 MG tablet TAKE 1 TABLET BY MOUTH EVERY DAY FOR COUGH 90 tablet 3   oxyCODONE (OXY IR/ROXICODONE) 5 MG immediate release tablet Take 0.5-1 tablets (2.5-5 mg total) by mouth 3 (three) times daily as needed for severe pain or moderate pain. 15 tablet 0   OXYGEN Inhale 2 L into the lungs every evening. Sleep with nasal cannula w/ O2 at 2lpm.     potassium chloride (KLOR-CON M10) 10 MEQ tablet TAKE 1 TABLET BY MOUTH EVERY DAY AS NEEDED ON DAYS WHEN TAKING LASIX 90 tablet 1   Respiratory Therapy Supplies (ONE FLOW SPIROMETER) DEVI Please use device as tolerated and according to instruction provided. 1 Device 0   rosuvastatin (CRESTOR) 5 MG tablet TAKE 1 TABLET (5 MG TOTAL) BY MOUTH DAILY AT 6 PM. 90 tablet 0   sertraline  (ZOLOFT) 100 MG tablet TAKE 1 TABLET BY MOUTH EVERY DAY 90 tablet 2   oxycodone (OXY-IR) 5 MG capsule Take half to one tab as needed for pain tid (Patient not taking: Reported on 07/18/2022) 15 capsule 0   No current facility-administered medications for this visit.    Allergies:   Hydralazine, Ciprofloxacin, Clarithromycin, Meloxicam, and Minocycline    Social History:  The patient  reports that she has never smoked. She has never used smokeless tobacco. She reports that she does not drink alcohol and does not use drugs.   Family History:  The patient's  family history includes Atrial fibrillation in her sister; Cancer in her sister; Depression in her father; Heart failure in her sister; Hodgkin's lymphoma in her brother; Multiple myeloma in her brother; Osteoarthritis in her mother; Prostate cancer in her brother.    ROS:  Please see the history of present illness.   Otherwise, review of systems are positive for none.   All other systems are reviewed and negative.    PHYSICAL EXAM: VS:  BP (!) 140/80 (BP Location: Left Arm, Patient Position: Sitting, Cuff Size: Normal)   Pulse 83   Ht 5\' 2"  (1.575 m)   Wt 136 lb (61.7 kg)   SpO2 96% Comment: 1 Liter of Oxygen  BMI 24.87 kg/m  , BMI Body mass index is 24.87 kg/m. GEN: Well nourished, well developed, in no acute distress  HEENT: normal  Neck: no JVD, carotid bruits, or masses Cardiac: Regular rate and rhythm; no murmurs, rubs, or gallops,no edema  Respiratory: Dry crackles consistent with pulmonary fibrosis. GI: soft, nontender, nondistended, + BS MS: no deformity or atrophy  Skin: warm and dry, no rash Neuro:  Strength and sensation are intact Psych: euthymic mood, full affect   EKG:  EKG is ordered today. The ekg ordered today demonstrates normal sinus rhythm with moderate LVH.  Recent Labs: No results found for requested labs within last 365 days.    Lipid Panel    Component Value Date/Time   CHOL 130 05/28/2021 1056    TRIG 97 05/28/2021 1056   HDL 51 05/28/2021 1056   CHOLHDL 2.6 11/11/2018 0317   VLDL 7 11/11/2018 0317   LDLCALC 61 05/28/2021 1056      Wt Readings from Last 3 Encounters:  07/18/22 136 lb (61.7 kg)  05/28/22 140 lb (63.5 kg)  01/14/22 134 lb (60.8 kg)         10/29/2018   11:54 AM  PAD Screen  Previous PAD dx? No  Previous surgical procedure? No  Pain with walking? No  Feet/toe relief with dangling? No  Painful, non-healing ulcers? No  Extremities discolored? No      ASSESSMENT AND PLAN:  1.  Persistent atrial fibrillation/flutter:  She is maintaining in sinus rhythm with metoprolol and diltiazem.  She is no longer on anticoagulation due to recurrent falls.    2.  Essential hypertension: Blood pressure is well controlled on current medications.  3.  Chronic diastolic heart failure she appears to be euvolemic.  This was only in the setting of atrial fibrillation.  She has furosemide 20 mg to be used as needed.    Disposition:   FU with me in 6 month.  Signed,  Kathlyn Sacramento, MD  07/18/2022 1:43 PM    [place]

## 2022-07-31 DIAGNOSIS — H9212 Otorrhea, left ear: Secondary | ICD-10-CM | POA: Diagnosis not present

## 2022-07-31 DIAGNOSIS — H6062 Unspecified chronic otitis externa, left ear: Secondary | ICD-10-CM | POA: Diagnosis not present

## 2022-07-31 DIAGNOSIS — H6122 Impacted cerumen, left ear: Secondary | ICD-10-CM | POA: Diagnosis not present

## 2022-07-31 DIAGNOSIS — H903 Sensorineural hearing loss, bilateral: Secondary | ICD-10-CM | POA: Diagnosis not present

## 2022-08-03 ENCOUNTER — Other Ambulatory Visit: Payer: Self-pay | Admitting: Nurse Practitioner

## 2022-08-23 DIAGNOSIS — M810 Age-related osteoporosis without current pathological fracture: Secondary | ICD-10-CM | POA: Diagnosis not present

## 2022-08-28 ENCOUNTER — Other Ambulatory Visit: Payer: Self-pay | Admitting: Internal Medicine

## 2022-08-28 DIAGNOSIS — M810 Age-related osteoporosis without current pathological fracture: Secondary | ICD-10-CM | POA: Diagnosis not present

## 2022-08-28 DIAGNOSIS — R5383 Other fatigue: Secondary | ICD-10-CM | POA: Diagnosis not present

## 2022-08-28 DIAGNOSIS — E038 Other specified hypothyroidism: Secondary | ICD-10-CM | POA: Diagnosis not present

## 2022-08-28 DIAGNOSIS — Z0001 Encounter for general adult medical examination with abnormal findings: Secondary | ICD-10-CM | POA: Diagnosis not present

## 2022-08-29 LAB — CBC WITH DIFFERENTIAL/PLATELET
Basophils Absolute: 0.1 10*3/uL (ref 0.0–0.2)
Basos: 1 %
EOS (ABSOLUTE): 0.7 10*3/uL — ABNORMAL HIGH (ref 0.0–0.4)
Eos: 9 %
Hematocrit: 40.6 % (ref 34.0–46.6)
Hemoglobin: 12.9 g/dL (ref 11.1–15.9)
Immature Grans (Abs): 0 10*3/uL (ref 0.0–0.1)
Immature Granulocytes: 0 %
Lymphocytes Absolute: 1.4 10*3/uL (ref 0.7–3.1)
Lymphs: 18 %
MCH: 30.7 pg (ref 26.6–33.0)
MCHC: 31.8 g/dL (ref 31.5–35.7)
MCV: 97 fL (ref 79–97)
Monocytes Absolute: 0.6 10*3/uL (ref 0.1–0.9)
Monocytes: 8 %
Neutrophils Absolute: 5 10*3/uL (ref 1.4–7.0)
Neutrophils: 64 %
Platelets: 205 10*3/uL (ref 150–450)
RBC: 4.2 x10E6/uL (ref 3.77–5.28)
RDW: 12.1 % (ref 11.7–15.4)
WBC: 7.8 10*3/uL (ref 3.4–10.8)

## 2022-08-29 LAB — T4, FREE: Free T4: 1.17 ng/dL (ref 0.82–1.77)

## 2022-08-29 LAB — LIPID PANEL
Chol/HDL Ratio: 2.7 ratio (ref 0.0–4.4)
Cholesterol, Total: 137 mg/dL (ref 100–199)
HDL: 50 mg/dL (ref >39)
LDL Chol Calc (NIH): 72 mg/dL (ref 0–99)
Triglycerides: 75 mg/dL (ref 0–149)
VLDL Cholesterol Cal: 15 mg/dL (ref 5–40)

## 2022-08-29 LAB — COMPREHENSIVE METABOLIC PANEL
ALT: 16 IU/L (ref 0–32)
AST: 23 IU/L (ref 0–40)
Albumin/Globulin Ratio: 0.9 — ABNORMAL LOW (ref 1.2–2.2)
Albumin: 3.9 g/dL (ref 3.6–4.6)
Alkaline Phosphatase: 78 IU/L (ref 44–121)
BUN/Creatinine Ratio: 20 (ref 12–28)
BUN: 13 mg/dL (ref 10–36)
Bilirubin Total: 0.6 mg/dL (ref 0.0–1.2)
CO2: 29 mmol/L (ref 20–29)
Calcium: 9 mg/dL (ref 8.7–10.3)
Chloride: 96 mmol/L (ref 96–106)
Creatinine, Ser: 0.65 mg/dL (ref 0.57–1.00)
Globulin, Total: 4.3 g/dL (ref 1.5–4.5)
Glucose: 128 mg/dL — ABNORMAL HIGH (ref 70–99)
Potassium: 4.1 mmol/L (ref 3.5–5.2)
Sodium: 138 mmol/L (ref 134–144)
Total Protein: 8.2 g/dL (ref 6.0–8.5)
eGFR: 79 mL/min/{1.73_m2} (ref >59)

## 2022-08-29 LAB — URINALYSIS, ROUTINE W REFLEX MICROSCOPIC
Bilirubin, UA: NEGATIVE
Glucose, UA: NEGATIVE
Ketones, UA: NEGATIVE
Leukocytes,UA: NEGATIVE
Nitrite, UA: NEGATIVE
RBC, UA: NEGATIVE
Specific Gravity, UA: 1.016 (ref 1.005–1.030)
Urobilinogen, Ur: 0.2 mg/dL (ref 0.2–1.0)
pH, UA: 7 (ref 5.0–7.5)

## 2022-08-29 LAB — TSH: TSH: 4.26 u[IU]/mL (ref 0.450–4.500)

## 2022-08-30 ENCOUNTER — Other Ambulatory Visit: Payer: Self-pay | Admitting: Internal Medicine

## 2022-09-02 DIAGNOSIS — H04123 Dry eye syndrome of bilateral lacrimal glands: Secondary | ICD-10-CM | POA: Diagnosis not present

## 2022-09-02 DIAGNOSIS — H353131 Nonexudative age-related macular degeneration, bilateral, early dry stage: Secondary | ICD-10-CM | POA: Diagnosis not present

## 2022-09-02 DIAGNOSIS — Z961 Presence of intraocular lens: Secondary | ICD-10-CM | POA: Diagnosis not present

## 2022-09-02 DIAGNOSIS — H26493 Other secondary cataract, bilateral: Secondary | ICD-10-CM | POA: Diagnosis not present

## 2022-09-03 ENCOUNTER — Telehealth: Payer: Self-pay | Admitting: Internal Medicine

## 2022-09-03 NOTE — Telephone Encounter (Signed)
Received [facility] certification plan of care. Gave to dfk for signature-nm

## 2022-09-10 ENCOUNTER — Telehealth: Payer: Self-pay | Admitting: Internal Medicine

## 2022-09-10 NOTE — Telephone Encounter (Signed)
[facility] order sign. Faxed back; [phone]

## 2022-09-13 ENCOUNTER — Telehealth: Payer: Self-pay

## 2022-09-13 NOTE — Telephone Encounter (Signed)
Pt daughter notified   

## 2022-09-13 NOTE — Telephone Encounter (Signed)
-----   Message from Lyndon Code, MD sent at 09/12/2022  9:33 PM EDT ----- I have reviewed her labs, will not change any medications

## 2022-09-13 NOTE — Progress Notes (Signed)
Lmom to call us back 

## 2022-09-20 ENCOUNTER — Telehealth: Payer: Self-pay

## 2022-09-20 NOTE — Telephone Encounter (Signed)
Pt daughter called that pt Oxygen is 85%,87% and 74 % and SOB put Oxygen on 2  L when is walking no other  symptoms just oxygen as  per lauren advised her go to ED  also she can go up oxygen 3 L   as needed she said was ok she like to make appt here to come in for next week advised her if symptoms getting worse  go to ED

## 2022-09-23 ENCOUNTER — Ambulatory Visit (INDEPENDENT_AMBULATORY_CARE_PROVIDER_SITE_OTHER): Payer: Medicare Other | Admitting: Physician Assistant

## 2022-09-23 ENCOUNTER — Encounter: Payer: Self-pay | Admitting: Physician Assistant

## 2022-09-23 VITALS — BP 120/70 | HR 90 | Temp 97.8°F | Resp 16 | Ht 62.0 in | Wt 135.0 lb

## 2022-09-23 DIAGNOSIS — J0110 Acute frontal sinusitis, unspecified: Secondary | ICD-10-CM

## 2022-09-23 DIAGNOSIS — I480 Paroxysmal atrial fibrillation: Secondary | ICD-10-CM | POA: Diagnosis not present

## 2022-09-23 DIAGNOSIS — J4520 Mild intermittent asthma, uncomplicated: Secondary | ICD-10-CM

## 2022-09-23 DIAGNOSIS — Z9981 Dependence on supplemental oxygen: Secondary | ICD-10-CM

## 2022-09-23 DIAGNOSIS — R0602 Shortness of breath: Secondary | ICD-10-CM

## 2022-09-23 MED ORDER — AZITHROMYCIN 250 MG PO TABS
ORAL_TABLET | ORAL | 0 refills | Status: AC
Start: 2022-09-23 — End: 2022-09-28

## 2022-09-23 MED ORDER — FLUTICASONE PROPIONATE 50 MCG/ACT NA SUSP: NASAL | 3 refills | Status: DC

## 2022-09-23 MED ORDER — IPRATROPIUM-ALBUTEROL 0.5-2.5 (3) MG/3ML IN SOLN: RESPIRATORY_TRACT | 1 refills | Status: DC

## 2022-09-23 MED ORDER — FLUTICASONE PROPIONATE HFA 110 MCG/ACT IN AERO: INHALATION_SPRAY | RESPIRATORY_TRACT | 3 refills | Status: DC

## 2022-09-23 NOTE — Progress Notes (Signed)
[facility] [address] [place], [place] [address]  Internal MEDICINE  Office Visit Note  Patient Name: Ashley Weiss  [id number]  [id number]  Date of Service: 10/01/2022  Chief Complaint  Patient presents with   Acute Visit   Shortness of Breath     HPI Pt is here for a sick visit due to SOB -First concern started last Wednesday, had been walking on 2L and was 91%. -Dry cough, runny nose, using fluticasone spray -used to do 1L in Am, and 2L at night, but then seeing oxygen levels dropping so bumped up to 3L during the day. 2L for sleeping has been ok. 2L when seated in office -thought it may have been new concentrator -If she walked without oxygen at home, HR 95 and oxygen 74% and daughter reports she then was tired and fell asleep with oxygen back on was at 95% -BP at home was elevated, 137/70 sat morning, sat afternoon went upu to 175 but this was prior to medication dose -has been napping more -Does a neb treatment at 1pm -Upon further questioning patient report she has been having sinus congestion and pressure, but had not told this to her daughter.   Current Medication:  Outpatient Encounter Medications as of 09/23/2022  Medication Sig   albuterol (VENTOLIN HFA) 108 (90 Base) MCG/ACT inhaler Inhale 2 puffs into the lungs every 6 (six) hours as needed for wheezing or shortness of breath.   aspirin EC 81 MG tablet Take 1 tablet (81 mg total) by mouth daily.   [EXPIRED] azithromycin (ZITHROMAX) 250 MG tablet Take 2 tablets on day 1, then 1 tablet daily on days 2 through 5   b complex vitamins capsule Take 1 capsule by mouth daily.   B Complex-C-Calcium (GNP B-COMPLEX PLUS VITAMIN C) TABS    Blood Glucose Monitoring Suppl (FREESTYLE FREEDOM LITE) w/Device KIT 1 kit by Does not apply route daily.   Calcium Citrate-Vitamin D (CITRACAL + D PO) Take by mouth.   cholecalciferol (VITAMIN D3) 25 MCG (1000 UNIT) tablet Take 1,000 Units by mouth daily.   denosumab  (PROLIA) 60 MG/ML SOSY injection Inject 60 mg into the skin every 6 (six) months.   diltiazem (CARDIZEM CD) 120 MG 24 hr capsule TAKE 1 CAPSULE BY MOUTH EVERY DAY   Fluocinolone Acetonide 0.01 % OIL Place 4 drops in ear(s) at bedtime as needed.    furosemide (LASIX) 20 MG tablet TAKE 1 TABLET BY MOUTH EVERY DAY AS NEEDED   glucose blood (FREESTYLE LITE) test strip Use as directed once a daily Diag E11.65   guaiFENesin (MUCINEX) 600 MG 12 hr tablet Take by mouth 2 (two) times daily.   hydrocortisone (ANUSOL-HC) 25 MG suppository Insert one after EACH bm QD   Lancets (FREESTYLE) lancets Use as directed once daily diag e11.65   levothyroxine (SYNTHROID) 25 MCG tablet TAKE 1 DAILY BY MOUTH IN THE MORNING ON A EMPTY STOMACH   metoprolol tartrate (LOPRESSOR) 100 MG tablet TAKE 1 TABLET BY MOUTH TWICE A DAY   metoprolol tartrate (LOPRESSOR) 25 MG tablet TAKE 1 TABLET (25 MG TOTAL) BY MOUTH DAILY AS NEEDED (FOR A FIB).   montelukast (SINGULAIR) 10 MG tablet TAKE 1 TABLET BY MOUTH EVERY DAY FOR COUGH   oxyCODONE (OXY IR/ROXICODONE) 5 MG immediate release tablet Take 0.5-1 tablets (2.5-5 mg total) by mouth 3 (three) times daily as needed for severe pain or moderate pain.   oxycodone (OXY-IR) 5 MG capsule Take half to one tab as needed  for pain tid   OXYGEN Inhale 2 L into the lungs every evening. Sleep with nasal cannula w/ O2 at 2lpm.   potassium chloride (KLOR-CON M10) 10 MEQ tablet TAKE 1 TABLET BY MOUTH EVERY DAY AS NEEDED ON DAYS WHEN TAKING LASIX   Respiratory Therapy Supplies (ONE FLOW SPIROMETER) DEVI Please use device as tolerated and according to instruction provided.   rosuvastatin (CRESTOR) 5 MG tablet TAKE 1 TABLET (5 MG TOTAL) BY MOUTH DAILY AT 6 PM.   sertraline (ZOLOFT) 100 MG tablet TAKE 1 TABLET BY MOUTH EVERY DAY   [DISCONTINUED] fluticasone (FLONASE) 50 MCG/ACT nasal spray INSTILL 1 SPRAY INTO BOTH NOSTRILS DAILY   [DISCONTINUED] fluticasone (FLOVENT HFA) 110 MCG/ACT inhaler TAKE 2  PUFFS BY MOUTH TWICE A DAY   [DISCONTINUED] ipratropium-albuterol (DUONEB) 0.5-2.5 (3) MG/3ML SOLN USE 1 VIAL 3 TIMES DAILY AS NEEDED FOR SHORTNESS OF BREATH DX J44.9   fluticasone (FLONASE) 50 MCG/ACT nasal spray INSTILL 1 SPRAY INTO BOTH NOSTRILS DAILY   fluticasone (FLOVENT HFA) 110 MCG/ACT inhaler TAKE 2 PUFFS BY MOUTH TWICE A DAY   ipratropium-albuterol (DUONEB) 0.5-2.5 (3) MG/3ML SOLN USE 1 VIAL 3 TIMES DAILY AS NEEDED FOR SHORTNESS OF BREATH DX J44.9   No facility-administered encounter medications on file as of 09/23/2022.      Medical History: Past Medical History:  Diagnosis Date   Arthritis    Asthma    Basal cell carcinoma 08/02/2014   R check ant to earlobe    Cancer (HCC)    skin   Chronic heart failure with preserved ejection fraction (HFpEF) ([facility])    a. 10/2018 Echo: EF 55-60%, restrictive filling. Mildly reduced RV fxn. RVSP 58.25mmHg. Mod dil LA, mildly dil RA. Mod MR/TR. Ao sclerosis w/o stenosis, triv AI.   Depression    DVT (deep venous thrombosis) (HCC)    Frequent falls    Hyperlipidemia    Hypertension    Persistent atrial fibrillation ([facility])    a. 10/2018 Amio load followed by DCCV; b. 10/2019 Amio d/c'd ->pulm toxicity; c. CHA2DS2VASc = 6-->prev on OAC but d/c'd 2/2 falls.  ASA only.   Phlebitis    Squamous cell carcinoma of skin 10/19/2014   right cheek     Vital Signs: BP 120/70   Pulse 90   Temp 97.8 F (36.6 C)   Resp 16   Ht 5\' 2"  (1.575 m)   Wt 135 lb (61.2 kg)   SpO2 94% Comment: Patient is on 2 liters of O2. Has increased from 1 liter.  BMI 24.69 kg/m    Review of Systems  Constitutional:  Positive for fatigue. Negative for fever.  HENT:  Positive for congestion, postnasal drip and sinus pressure. Negative for mouth sores.   Respiratory:  Positive for cough and shortness of breath. Negative for wheezing.   Cardiovascular:  Negative for chest pain and palpitations.  Genitourinary:  Negative for flank pain.  Musculoskeletal:  Positive for  gait problem.  Psychiatric/Behavioral: Negative.      Physical Exam Vitals and nursing note reviewed.  Constitutional:      Appearance: Normal appearance.  HENT:     Head: Normocephalic and atraumatic.     Nose: Nose normal.     Mouth/Throat:     Mouth: Mucous membranes are moist.     Pharynx: No posterior oropharyngeal erythema.  Eyes:     Extraocular Movements: Extraocular movements intact.     Pupils: Pupils are equal, round, and reactive to light.  Cardiovascular:  Rate and Rhythm: Normal rate. Rhythm irregular.     Pulses: Normal pulses.     Heart sounds: Normal heart sounds.  Pulmonary:     Effort: Pulmonary effort is normal.     Breath sounds: Normal breath sounds. No wheezing or rhonchi.  Neurological:     General: No focal deficit present.     Mental Status: She is alert.  Psychiatric:        Mood and Affect: Mood normal.        Behavior: Behavior normal.       Assessment/Plan: 1. Acute non-recurrent frontal sinusitis Will start on zpak (family reports she is able to take this without any problem) and instructed patient to notify family of symptoms if not improving - azithromycin (ZITHROMAX) 250 MG tablet; Take 2 tablets on day 1, then 1 tablet daily on days 2 through 5  Dispense: 6 tablet; Refill: 0  2. Chronic asthma, mild intermittent, uncomplicated - fluticasone (FLOVENT HFA) 110 MCG/ACT inhaler; TAKE 2 PUFFS BY MOUTH TWICE A DAY  Dispense: 12 each; Refill: 3  3. SOB (shortness of breath) on exertion Increase oxygen to 3L if needed for ambulation, will treat sinus infection as this may be contributing to fatigue and SOBOE. Advised to go to ED if new or worsening symptoms  4. Oxygen dependent Continue oxygen as before  5. PAF (paroxysmal atrial fibrillation) (HCC) Continue current medications and follow up with cardiology   General Counseling: Muna verbalizes understanding of the findings of todays visit and agrees with plan of treatment. I have  discussed any further diagnostic evaluation that may be needed or ordered today. We also reviewed her medications today. she has been encouraged to call the office with any questions or concerns that should arise related to todays visit.    Counseling:    No orders of the defined types were placed in this encounter.   Meds ordered this encounter  Medications   fluticasone (FLONASE) 50 MCG/ACT nasal spray    Sig: INSTILL 1 SPRAY INTO BOTH NOSTRILS DAILY    Dispense:  48 mL    Refill:  3   fluticasone (FLOVENT HFA) 110 MCG/ACT inhaler    Sig: TAKE 2 PUFFS BY MOUTH TWICE A DAY    Dispense:  12 each    Refill:  3   ipratropium-albuterol (DUONEB) 0.5-2.5 (3) MG/3ML SOLN    Sig: USE 1 VIAL 3 TIMES DAILY AS NEEDED FOR SHORTNESS OF BREATH DX J44.9    Dispense:  810 mL    Refill:  1   azithromycin (ZITHROMAX) 250 MG tablet    Sig: Take 2 tablets on day 1, then 1 tablet daily on days 2 through 5    Dispense:  6 tablet    Refill:  0    Time spent:30 Minutes

## 2022-09-25 DIAGNOSIS — H6122 Impacted cerumen, left ear: Secondary | ICD-10-CM | POA: Diagnosis not present

## 2022-09-25 DIAGNOSIS — H9212 Otorrhea, left ear: Secondary | ICD-10-CM | POA: Diagnosis not present

## 2022-09-25 DIAGNOSIS — H903 Sensorineural hearing loss, bilateral: Secondary | ICD-10-CM | POA: Diagnosis not present

## 2022-09-25 DIAGNOSIS — H6062 Unspecified chronic otitis externa, left ear: Secondary | ICD-10-CM | POA: Diagnosis not present

## 2022-09-25 DIAGNOSIS — H90A31 Mixed conductive and sensorineural hearing loss, unilateral, right ear with restricted hearing on the contralateral side: Secondary | ICD-10-CM | POA: Diagnosis not present

## 2022-09-30 ENCOUNTER — Telehealth: Payer: Self-pay

## 2022-09-30 DIAGNOSIS — I5032 Chronic diastolic (congestive) heart failure: Secondary | ICD-10-CM | POA: Diagnosis not present

## 2022-09-30 DIAGNOSIS — E039 Hypothyroidism, unspecified: Secondary | ICD-10-CM | POA: Diagnosis not present

## 2022-09-30 DIAGNOSIS — R000 Tachycardia, unspecified: Secondary | ICD-10-CM | POA: Diagnosis not present

## 2022-09-30 DIAGNOSIS — I110 Hypertensive heart disease with heart failure: Secondary | ICD-10-CM | POA: Diagnosis not present

## 2022-09-30 DIAGNOSIS — J449 Chronic obstructive pulmonary disease, unspecified: Secondary | ICD-10-CM | POA: Diagnosis not present

## 2022-09-30 DIAGNOSIS — E785 Hyperlipidemia, unspecified: Secondary | ICD-10-CM | POA: Diagnosis not present

## 2022-09-30 DIAGNOSIS — R918 Other nonspecific abnormal finding of lung field: Secondary | ICD-10-CM | POA: Diagnosis not present

## 2022-09-30 DIAGNOSIS — I4821 Permanent atrial fibrillation: Secondary | ICD-10-CM | POA: Diagnosis not present

## 2022-09-30 DIAGNOSIS — I509 Heart failure, unspecified: Secondary | ICD-10-CM | POA: Diagnosis not present

## 2022-09-30 DIAGNOSIS — I4891 Unspecified atrial fibrillation: Secondary | ICD-10-CM | POA: Diagnosis not present

## 2022-09-30 DIAGNOSIS — J9611 Chronic respiratory failure with hypoxia: Secondary | ICD-10-CM | POA: Diagnosis not present

## 2022-09-30 DIAGNOSIS — R002 Palpitations: Secondary | ICD-10-CM | POA: Diagnosis not present

## 2022-09-30 DIAGNOSIS — I10 Essential (primary) hypertension: Secondary | ICD-10-CM | POA: Diagnosis not present

## 2022-09-30 DIAGNOSIS — I4811 Longstanding persistent atrial fibrillation: Secondary | ICD-10-CM | POA: Diagnosis not present

## 2022-09-30 DIAGNOSIS — J45909 Unspecified asthma, uncomplicated: Secondary | ICD-10-CM | POA: Diagnosis not present

## 2022-09-30 DIAGNOSIS — I499 Cardiac arrhythmia, unspecified: Secondary | ICD-10-CM | POA: Diagnosis not present

## 2022-09-30 DIAGNOSIS — Z9981 Dependence on supplemental oxygen: Secondary | ICD-10-CM | POA: Diagnosis not present

## 2022-09-30 DIAGNOSIS — Z66 Do not resuscitate: Secondary | ICD-10-CM | POA: Diagnosis present

## 2022-09-30 NOTE — Telephone Encounter (Signed)
Pt daughter just Lorain Childes that pt is going to hospital

## 2022-10-01 DIAGNOSIS — I499 Cardiac arrhythmia, unspecified: Secondary | ICD-10-CM | POA: Diagnosis not present

## 2022-10-01 DIAGNOSIS — I4891 Unspecified atrial fibrillation: Secondary | ICD-10-CM | POA: Diagnosis not present

## 2022-10-02 ENCOUNTER — Telehealth: Payer: Self-pay

## 2022-10-02 DIAGNOSIS — I4891 Unspecified atrial fibrillation: Secondary | ICD-10-CM | POA: Diagnosis not present

## 2022-10-02 NOTE — Telephone Encounter (Signed)
Hospice from [facility] called Ashley Weiss [phone] for verbal order pt is going home from hospital with hospice as per Dr Beverely Risen gave verbal order and advised her that dr Welton Flakes don't do comfort care

## 2022-10-21 DEATH — deceased

## 2022-11-11 ENCOUNTER — Other Ambulatory Visit: Payer: Self-pay | Admitting: Cardiovascular Disease

## 2022-12-31 ENCOUNTER — Ambulatory Visit: Payer: Medicare Other | Admitting: Cardiovascular Disease

## 2023-06-02 ENCOUNTER — Ambulatory Visit: Payer: Medicare Other | Admitting: Internal Medicine
